# Patient Record
Sex: Female | Born: 1940
Health system: Southern US, Community
[De-identification: ages and names within clinical notes are randomized; demographics above are authoritative.]

## PROBLEM LIST (undated history)

## (undated) DIAGNOSIS — D649 Anemia, unspecified: Secondary | ICD-10-CM

## (undated) DIAGNOSIS — C50919 Malignant neoplasm of unspecified site of unspecified female breast: Secondary | ICD-10-CM

## (undated) DIAGNOSIS — M810 Age-related osteoporosis without current pathological fracture: Secondary | ICD-10-CM

## (undated) DIAGNOSIS — Z923 Personal history of irradiation: Secondary | ICD-10-CM

## (undated) DIAGNOSIS — C50312 Malignant neoplasm of lower-inner quadrant of left female breast: Secondary | ICD-10-CM

## (undated) DIAGNOSIS — E785 Hyperlipidemia, unspecified: Secondary | ICD-10-CM

## (undated) HISTORY — DX: Anemia, unspecified: D64.9

## (undated) HISTORY — DX: Age-related osteoporosis without current pathological fracture: M81.0

## (undated) HISTORY — DX: Hyperlipidemia, unspecified: E78.5

## (undated) HISTORY — DX: Malignant neoplasm of lower-inner quadrant of left female breast: C50.312

---

## 2008-09-18 HISTORY — PX: COLONOSCOPY: SHX174

## 2009-05-11 ENCOUNTER — Ambulatory Visit: Payer: Self-pay | Admitting: Gastroenterology

## 2012-08-09 DIAGNOSIS — E785 Hyperlipidemia, unspecified: Secondary | ICD-10-CM | POA: Insufficient documentation

## 2012-08-09 DIAGNOSIS — I10 Essential (primary) hypertension: Secondary | ICD-10-CM | POA: Insufficient documentation

## 2012-08-09 DIAGNOSIS — M810 Age-related osteoporosis without current pathological fracture: Secondary | ICD-10-CM | POA: Insufficient documentation

## 2012-08-09 DIAGNOSIS — M419 Scoliosis, unspecified: Secondary | ICD-10-CM | POA: Insufficient documentation

## 2012-08-09 DIAGNOSIS — D649 Anemia, unspecified: Secondary | ICD-10-CM | POA: Insufficient documentation

## 2012-08-12 DIAGNOSIS — D72819 Decreased white blood cell count, unspecified: Secondary | ICD-10-CM | POA: Insufficient documentation

## 2012-09-18 HISTORY — PX: UPPER GI ENDOSCOPY: SHX6162

## 2013-04-25 ENCOUNTER — Ambulatory Visit: Payer: Self-pay | Admitting: Unknown Physician Specialty

## 2013-04-30 LAB — PATHOLOGY REPORT

## 2013-07-09 ENCOUNTER — Ambulatory Visit: Payer: Self-pay | Admitting: Unknown Physician Specialty

## 2014-07-19 DIAGNOSIS — C50312 Malignant neoplasm of lower-inner quadrant of left female breast: Secondary | ICD-10-CM

## 2014-07-19 HISTORY — DX: Malignant neoplasm of lower-inner quadrant of left female breast: C50.312

## 2014-08-18 HISTORY — PX: BREAST BIOPSY: SHX20

## 2014-08-24 ENCOUNTER — Encounter: Payer: Self-pay | Admitting: *Deleted

## 2014-09-08 ENCOUNTER — Encounter: Payer: Self-pay | Admitting: General Surgery

## 2014-09-08 ENCOUNTER — Ambulatory Visit (INDEPENDENT_AMBULATORY_CARE_PROVIDER_SITE_OTHER): Payer: Medicare Other | Admitting: General Surgery

## 2014-09-08 ENCOUNTER — Ambulatory Visit: Payer: Medicare Other

## 2014-09-08 VITALS — BP 130/78 | HR 80 | Resp 12 | Ht 62.0 in | Wt 131.0 lb

## 2014-09-08 DIAGNOSIS — N63 Unspecified lump in unspecified breast: Secondary | ICD-10-CM

## 2014-09-08 NOTE — Patient Instructions (Signed)

## 2014-09-08 NOTE — Progress Notes (Signed)
Patient ID: Rebecca Mitchell, female   DOB: [birth year], 73 y.o.   MRN: [id number]  Chief Complaint  Patient presents with  . Other    left breast mass    HPI Rebecca Mitchell is a 73 y.o. female who presents for a breast evaluation. The most recent mammogram was done on 08/06/14. She had additional views of the left breast 08/21/14 as well as an ultrasound.  Patient does perform regular self breast checks and gets regular mammograms done. She denies any problems with the breasts at this time. No personal history of breast problems. She has a sister with a history of breast cancer.     HPI  Past Medical History  Diagnosis Date  . Hyperlipidemia   . Anemia     Past Surgical History  Procedure Laterality Date  . Colonoscopy  2010  . Upper gi endoscopy  2014    Family History  Problem Relation Age of Onset  . Cancer Sister 38    breast    Social History History  Substance Use Topics  . Smoking status: Never Smoker   . Smokeless tobacco: Not on file  . Alcohol Use: No    No Known Allergies  Current Outpatient Prescriptions  Medication Sig Dispense Refill  . amLODipine (NORVASC) 5 MG tablet Take 5 mg by mouth daily.   0  . Ascorbic Acid (VITAMIN C) 1000 MG tablet Take 1,000 mg by mouth daily.    . Calcium Citrate-Vitamin D (CALCIUM CITRATE + PO) Take 1 tablet by mouth daily.    Marland Kitchen co-enzyme Q-10 30 MG capsule Take 30 mg by mouth 3 (three) times daily.    . ferrous sulfate 325 (65 FE) MG tablet Take 325 mg by mouth daily with breakfast.    . losartan-hydrochlorothiazide (HYZAAR) 100-25 MG per tablet Take 1 tablet by mouth daily.   1  . Lutein-Zeaxanthin 15-0.7 MG CAPS Take 1 capsule by mouth daily.    . metoprolol tartrate (LOPRESSOR) 25 MG tablet Take 12.5 mg by mouth 2 (two) times daily.   1  . Multiple Vitamin (MULTIVITAMIN) tablet Take 1 tablet by mouth daily.    . Omega-3 Fatty Acids (FISH OIL PO) Take 1 capsule by mouth daily.    . pravastatin (PRAVACHOL) 10 MG tablet Take  10 mg by mouth daily.   1   No current facility-administered medications for this visit.    Review of Systems Review of Systems  Constitutional: Negative.   Respiratory: Negative.   Cardiovascular: Negative.     Blood pressure 130/78, pulse 80, resp. rate 12, height 5\' 2"  (1.575 m), weight 131 lb (59.421 kg).  Physical Exam Physical Exam  Constitutional: She is oriented to person, place, and time. She appears well-developed and well-nourished.  Eyes: Conjunctivae are normal. No scleral icterus.  Neck: Neck supple. No thyromegaly present.  Cardiovascular: Normal rate, regular rhythm and normal heart sounds.   No murmur heard. Pulmonary/Chest: Effort normal and breath sounds normal. Right breast exhibits no inverted nipple, no mass, no nipple discharge, no skin change and no tenderness. Left breast exhibits mass (sub cm firm mass at 8 oclock left breast. 7 cm from nipple). Left breast exhibits no inverted nipple, no nipple discharge, no skin change and no tenderness.  Abdominal: Soft. Normal appearance and bowel sounds are normal. There is no hepatosplenomegaly. There is no tenderness. No hernia.  Lymphadenopathy:    She has no cervical adenopathy.    She has no axillary adenopathy.  Neurological: She  is alert and oriented to person, place, and time.  Skin: Skin is warm and dry.    Data Reviewed Mammogram showed an irregular density in left breast LIQ. Corresponding to this US showed a hypoechoic 30mm mass.at 8ocl    Assessment      Left breast mass for which core biopsy is recommended. Discussed with pt and she was agreeable.    Plan    Core biopsy completed and clip placed. Await path report.      CC; Dicky Doe, M.D.  Michaeljames Milnes G 09/08/2014, 7:19 PM

## 2014-09-09 ENCOUNTER — Encounter: Payer: Self-pay | Admitting: General Surgery

## 2014-09-09 ENCOUNTER — Ambulatory Visit (INDEPENDENT_AMBULATORY_CARE_PROVIDER_SITE_OTHER): Payer: Medicare Other | Admitting: General Surgery

## 2014-09-09 ENCOUNTER — Telehealth: Payer: Self-pay | Admitting: *Deleted

## 2014-09-09 VITALS — BP 140/80 | HR 80 | Resp 12 | Ht 64.0 in | Wt 132.0 lb

## 2014-09-09 DIAGNOSIS — N63 Unspecified lump in unspecified breast: Secondary | ICD-10-CM

## 2014-09-09 DIAGNOSIS — C50912 Malignant neoplasm of unspecified site of left female breast: Secondary | ICD-10-CM

## 2014-09-09 NOTE — Telephone Encounter (Signed)
She had called answering service because she noticed bloody drainage on her dressing at the biopsy site. She states Dr Jamal Collin called her back yesterday. Dressing care discussed.

## 2014-09-09 NOTE — Progress Notes (Signed)
This is a 74 year old female here today for a breast discussion. Left breast a little bruised  She had core biopsy of a small mass in left breast yesterday. Path showed invasive ductal CA. ER/PR ,Her 2 pending. Discussed in full with pt and her husband.  Current clinical stage is T1b,N0. Lumpectomy and SN biopsy discussed and pt is agreeable. Role of radiation, possible need for chemo based on prognostic factors and lymph node status explained.  Patient's surgery has been scheduled for 10-01-14 at [facility].

## 2014-09-09 NOTE — Patient Instructions (Signed)
Lumpectomy A lumpectomy is a form of "breast conserving" or "breast preservation" surgery. It may also be referred to as a partial mastectomy. During a lumpectomy, the portion of the breast that contains the cancerous tumor or breast mass (the lump) is removed. Some normal tissue around the lump may also be removed to make sure all of the tumor has been removed.  LET YOUR HEALTH CARE PROVIDER KNOW ABOUT:  Any allergies you have.  All medicines you are taking, including vitamins, herbs, eye drops, creams, and over-the-counter medicines.  Previous problems you or members of your family have had with the use of anesthetics.  Any blood disorders you have.  Previous surgeries you have had.  Medical conditions you have. RISKS AND COMPLICATIONS Generally, this is a safe procedure. However, problems can occur and include:  Bleeding.  Infection.  Pain.  Temporary swelling.  Change in the shape of the breast, particularly if a large portion is removed. BEFORE THE PROCEDURE  Ask your health care provider about changing or stopping your regular medicines. This is especially important if you are taking diabetes medicines or blood thinners.  Do not eat or drink anything after midnight on the night before the procedure or as directed by your health care provider. Ask your health care provider if you can take a sip of water with any approved medicines.  On the day of surgery, your health care provider will use a mammogram or ultrasound to locate and mark the tumor in your breast. These markings on your breast will show where the cut (incision) will be made. PROCEDURE   An IV tube will be put into one of your veins.  You may be given medicine to help you relax before the surgery (sedative). You will be given one of the following:  A medicine that numbs the area (local anesthetic).  A medicine that makes you fall asleep (general anesthetic).  Your health care provider will use a kind of  electric scalpel that uses heat to minimize bleeding (electrocautery knife).  A curved incision (like a smile or frown) that follows the natural curve of your breast is made, to allow for minimal scarring and better healing.  The tumor will be removed with some of the surrounding tissue. This will be sent to the lab for analysis. Your health care provider may also remove your lymph nodes at this time if needed.  Sometimes, but not always, a rubber tube called a drain will be surgically inserted into your breast area or armpit to collect excess fluid that may accumulate in the space where the tumor was. This drain is connected to a plastic bulb on the outside of your body. This drain creates suction to help remove the fluid.  The incisions will be closed with stitches (sutures).  A bandage may be placed over the incisions. AFTER THE PROCEDURE  You will be taken to the recovery area.  You will be given medicine for pain.  A small rubber drain may be placed in the breast for 2-3 days to prevent a collection of blood (hematoma) from developing in the breast. You will be given instructions on caring for the drain before you go home.  A pressure bandage (dressing) will be applied for 1-2 days to prevent bleeding. Ask your health care provider how to care for your bandage at home. Document Released: 10/16/2006 Document Revised: 01/19/2014 Document Reviewed: 02/07/2013 Exit[employer] Patient Information 2015 [employer]. This information is not intended to replace advice given to you by   your health care provider. Make sure you discuss any questions you have with your health care provider.  

## 2014-09-09 NOTE — Addendum Note (Signed)
Addended by: Christene Lye on: 09/09/2014 05:10 PM   Modules accepted: Orders

## 2014-09-14 ENCOUNTER — Ambulatory Visit (INDEPENDENT_AMBULATORY_CARE_PROVIDER_SITE_OTHER): Payer: Self-pay | Admitting: *Deleted

## 2014-09-14 DIAGNOSIS — C50912 Malignant neoplasm of unspecified site of left female breast: Secondary | ICD-10-CM

## 2014-09-14 NOTE — Patient Instructions (Signed)
The patient is aware to call back for any questions or concerns.  

## 2014-09-14 NOTE — Progress Notes (Signed)
Patient here today for follow up post breast biopsy.  Dressing removed, steristrip in place and aware it may come off in one week.  Minimal bruising noted.  The patient is aware that a heating pad may be used for comfort as needed.  Aware of pathology. Follow up as scheduled.

## 2014-09-15 ENCOUNTER — Telehealth: Payer: Self-pay | Admitting: *Deleted

## 2014-09-15 NOTE — Telephone Encounter (Signed)
Notified patient as instructed ER/PR positive, patient pleased. Discussed follow-up appointments 09-28-14, patient agrees. Surgery planned for 10-01-14.

## 2014-09-18 DIAGNOSIS — Z923 Personal history of irradiation: Secondary | ICD-10-CM

## 2014-09-18 DIAGNOSIS — C50919 Malignant neoplasm of unspecified site of unspecified female breast: Secondary | ICD-10-CM

## 2014-09-18 HISTORY — DX: Personal history of irradiation: Z92.3

## 2014-09-18 HISTORY — DX: Malignant neoplasm of unspecified site of unspecified female breast: C50.919

## 2014-09-21 ENCOUNTER — Ambulatory Visit: Payer: Self-pay | Admitting: General Surgery

## 2014-09-21 DIAGNOSIS — I10 Essential (primary) hypertension: Secondary | ICD-10-CM | POA: Diagnosis not present

## 2014-09-21 DIAGNOSIS — J208 Acute bronchitis due to other specified organisms: Secondary | ICD-10-CM | POA: Diagnosis not present

## 2014-09-21 DIAGNOSIS — Z01810 Encounter for preprocedural cardiovascular examination: Secondary | ICD-10-CM | POA: Diagnosis not present

## 2014-09-21 DIAGNOSIS — C50912 Malignant neoplasm of unspecified site of left female breast: Secondary | ICD-10-CM | POA: Diagnosis not present

## 2014-09-21 DIAGNOSIS — R05 Cough: Secondary | ICD-10-CM | POA: Diagnosis not present

## 2014-09-21 DIAGNOSIS — Z01812 Encounter for preprocedural laboratory examination: Secondary | ICD-10-CM | POA: Diagnosis not present

## 2014-09-21 LAB — POTASSIUM: Potassium: 4.1 mmol/L (ref 3.5–5.1)

## 2014-09-28 ENCOUNTER — Ambulatory Visit: Payer: Self-pay

## 2014-09-28 ENCOUNTER — Encounter: Payer: Self-pay | Admitting: General Surgery

## 2014-09-28 ENCOUNTER — Ambulatory Visit (INDEPENDENT_AMBULATORY_CARE_PROVIDER_SITE_OTHER): Payer: Medicare Other | Admitting: General Surgery

## 2014-09-28 VITALS — BP 132/68 | HR 70 | Resp 14 | Ht 64.0 in | Wt 136.0 lb

## 2014-09-28 DIAGNOSIS — N63 Unspecified lump in breast: Secondary | ICD-10-CM

## 2014-09-28 DIAGNOSIS — N6320 Unspecified lump in the left breast, unspecified quadrant: Secondary | ICD-10-CM

## 2014-09-28 NOTE — Patient Instructions (Signed)
Lumpectomy A lumpectomy is a form of "breast conserving" or "breast preservation" surgery. It may also be referred to as a partial mastectomy. During a lumpectomy, the portion of the breast that contains the cancerous tumor or breast mass (the lump) is removed. Some normal tissue around the lump may also be removed to make sure all of the tumor has been removed.  LET YOUR HEALTH CARE PROVIDER KNOW ABOUT:  Any allergies you have.  All medicines you are taking, including vitamins, herbs, eye drops, creams, and over-the-counter medicines.  Previous problems you or members of your family have had with the use of anesthetics.  Any blood disorders you have.  Previous surgeries you have had.  Medical conditions you have. RISKS AND COMPLICATIONS Generally, this is a safe procedure. However, problems can occur and include:  Bleeding.  Infection.  Pain.  Temporary swelling.  Change in the shape of the breast, particularly if a large portion is removed. BEFORE THE PROCEDURE  Ask your health care provider about changing or stopping your regular medicines. This is especially important if you are taking diabetes medicines or blood thinners.  Do not eat or drink anything after midnight on the night before the procedure or as directed by your health care provider. Ask your health care provider if you can take a sip of water with any approved medicines.  On the day of surgery, your health care provider will use a mammogram or ultrasound to locate and mark the tumor in your breast. These markings on your breast will show where the cut (incision) will be made. PROCEDURE   An IV tube will be put into one of your veins.  You may be given medicine to help you relax before the surgery (sedative). You will be given one of the following:  A medicine that numbs the area (local anesthetic).  A medicine that makes you fall asleep (general anesthetic).  Your health care provider will use a kind of  electric scalpel that uses heat to minimize bleeding (electrocautery knife).  A curved incision (like a smile or frown) that follows the natural curve of your breast is made, to allow for minimal scarring and better healing.  The tumor will be removed with some of the surrounding tissue. This will be sent to the lab for analysis. Your health care provider may also remove your lymph nodes at this time if needed.  Sometimes, but not always, a rubber tube called a drain will be surgically inserted into your breast area or armpit to collect excess fluid that may accumulate in the space where the tumor was. This drain is connected to a plastic bulb on the outside of your body. This drain creates suction to help remove the fluid.  The incisions will be closed with stitches (sutures).  A bandage may be placed over the incisions. AFTER THE PROCEDURE  You will be taken to the recovery area.  You will be given medicine for pain.  A small rubber drain may be placed in the breast for 2-3 days to prevent a collection of blood (hematoma) from developing in the breast. You will be given instructions on caring for the drain before you go home.  A pressure bandage (dressing) will be applied for 1-2 days to prevent bleeding. Ask your health care provider how to care for your bandage at home. Document Released: 10/16/2006 Document Revised: 01/19/2014 Document Reviewed: 02/07/2013 Exit[employer] Patient Information 2015 [employer]. This information is not intended to replace advice given to you by   your health care provider. Make sure you discuss any questions you have with your health care provider.  

## 2014-09-28 NOTE — Progress Notes (Signed)
Patient ID: Rebecca Mitchell, female   DOB: 11-Jun-1941, 74 y.o.   MRN: [id number]  Chief Complaint  Patient presents with  . Pre-op Exam    left breast lumpectomy    HPI Rebecca Mitchell is a 74 y.o. female here today for her pre op left breast lumpectomy scheduled for 10/01/14. Pt had a 5 mm left breast nodule at 8ocl, biopsy showed invasive CA , ER pos and her-2 neg HPI  Past Medical History  Diagnosis Date  . Hyperlipidemia   . Anemia     Past Surgical History  Procedure Laterality Date  . Colonoscopy  2010  . Upper gi endoscopy  2014    Family History  Problem Relation Age of Onset  . Cancer Sister 16    breast    Social History History  Substance Use Topics  . Smoking status: Never Smoker   . Smokeless tobacco: Not on file  . Alcohol Use: No    No Known Allergies  Current Outpatient Prescriptions  Medication Sig Dispense Refill  . azithromycin (ZITHROMAX) 250 MG tablet   0  . amLODipine (NORVASC) 5 MG tablet Take 5 mg by mouth daily.   0  . Ascorbic Acid (VITAMIN C) 1000 MG tablet Take 1,000 mg by mouth daily.    . Calcium Citrate-Vitamin D (CALCIUM CITRATE + PO) Take 1 tablet by mouth daily.    Marland Kitchen co-enzyme Q-10 30 MG capsule Take 30 mg by mouth 3 (three) times daily.    . ferrous sulfate 325 (65 FE) MG tablet Take 325 mg by mouth daily with breakfast.    . losartan-hydrochlorothiazide (HYZAAR) 100-25 MG per tablet Take 1 tablet by mouth daily.   1  . Lutein-Zeaxanthin 15-0.7 MG CAPS Take 1 capsule by mouth daily.    . metoprolol tartrate (LOPRESSOR) 25 MG tablet Take 12.5 mg by mouth 2 (two) times daily.   1  . Multiple Vitamin (MULTIVITAMIN) tablet Take 1 tablet by mouth daily.    . Omega-3 Fatty Acids (FISH OIL PO) Take 1 capsule by mouth daily.    . pravastatin (PRAVACHOL) 10 MG tablet Take 10 mg by mouth daily.   1   No current facility-administered medications for this visit.    Review of Systems Review of Systems  Constitutional: Negative.    Respiratory: Negative.   Cardiovascular: Negative.     Blood pressure 132/68, pulse 70, resp. rate 14, height _0  (1.626 m), weight 136 lb (61.689 kg).  Physical Exam Physical Exam  Constitutional: She is oriented to person, place, and time. She appears well-developed and well-nourished.  Eyes: Conjunctivae are normal. No scleral icterus.  Neck: Neck supple.  Cardiovascular: Normal rate, regular rhythm and normal heart sounds.   Pulmonary/Chest: Effort normal and breath sounds normal. Right breast exhibits no inverted nipple, no mass, no nipple discharge, no skin change and no tenderness. Left breast exhibits no inverted nipple, no nipple discharge, no skin change and no tenderness.  Abdominal: Soft. Normal appearance and bowel sounds are normal. There is no hepatosplenomegaly. There is no tenderness. No hernia.  Lymphadenopathy:    She has no cervical adenopathy.    She has no axillary adenopathy.  Neurological: She is alert and oriented to person, place, and time.  Skin: Skin is warm and dry.    Data Reviewed US done today at 8 ocl left breast,shadwing area noted consistent with clip and prior mass  Assessment     CA left breast- clinical stage 1-T1,N0. ER pos,  Her 2 not amplified  Plan   Discussed lumpectomy and SN biopsy with pt. Patient is scheduled for left breast lumpectomy and SN biopsy on 10/01/14.         Taelon Bendorf G 09/28/2014, 3:45 PM

## 2014-10-01 ENCOUNTER — Ambulatory Visit: Payer: Self-pay | Admitting: General Surgery

## 2014-10-01 ENCOUNTER — Encounter: Payer: Self-pay | Admitting: General Surgery

## 2014-10-01 DIAGNOSIS — C50312 Malignant neoplasm of lower-inner quadrant of left female breast: Secondary | ICD-10-CM

## 2014-10-01 DIAGNOSIS — C50919 Malignant neoplasm of unspecified site of unspecified female breast: Secondary | ICD-10-CM | POA: Diagnosis not present

## 2014-10-01 DIAGNOSIS — C50912 Malignant neoplasm of unspecified site of left female breast: Secondary | ICD-10-CM | POA: Diagnosis not present

## 2014-10-01 DIAGNOSIS — I10 Essential (primary) hypertension: Secondary | ICD-10-CM | POA: Diagnosis not present

## 2014-10-01 HISTORY — PX: BREAST LUMPECTOMY: SHX2

## 2014-10-01 HISTORY — PX: BREAST SURGERY: SHX581

## 2014-10-05 ENCOUNTER — Encounter: Payer: Self-pay | Admitting: General Surgery

## 2014-10-12 ENCOUNTER — Ambulatory Visit (INDEPENDENT_AMBULATORY_CARE_PROVIDER_SITE_OTHER): Payer: Self-pay | Admitting: General Surgery

## 2014-10-12 ENCOUNTER — Encounter: Payer: Self-pay | Admitting: General Surgery

## 2014-10-12 DIAGNOSIS — C50912 Malignant neoplasm of unspecified site of left female breast: Secondary | ICD-10-CM

## 2014-10-12 NOTE — Progress Notes (Signed)
This is a 74 year old female here today for her post op left breast lumpectomy and sentinel node biopsy done on 10/01/14. Patient states she is doing well.  Left excision site healing well. No signs of infection/seroma. Pathology showed a clear margins, negative node.   Patient will be referred to Radiation Oncologist for treatment. Patient to return after radiation therapy is complete.   Patient has been scheduled for an appointment with Dr. Baruch Gouty at the [facility] for Friday, 10-16-14 at 11 am.

## 2014-10-12 NOTE — Patient Instructions (Addendum)
Patient to follow up with our office once radiation therapy is complete. The patient is aware to call back for any questions or concerns.  Patient has been scheduled for an appointment with Dr. Baruch Gouty at the [facility] for Friday, 10-16-14 at 11 am.

## 2014-10-13 ENCOUNTER — Encounter: Payer: Self-pay | Admitting: General Surgery

## 2014-10-13 DIAGNOSIS — Z853 Personal history of malignant neoplasm of breast: Secondary | ICD-10-CM | POA: Insufficient documentation

## 2014-10-16 ENCOUNTER — Ambulatory Visit: Payer: Self-pay | Admitting: Radiation Oncology

## 2014-10-16 DIAGNOSIS — C50312 Malignant neoplasm of lower-inner quadrant of left female breast: Secondary | ICD-10-CM | POA: Diagnosis not present

## 2014-10-16 DIAGNOSIS — C50912 Malignant neoplasm of unspecified site of left female breast: Secondary | ICD-10-CM | POA: Diagnosis not present

## 2014-10-19 ENCOUNTER — Ambulatory Visit: Payer: Self-pay | Admitting: Radiation Oncology

## 2014-10-28 DIAGNOSIS — C50312 Malignant neoplasm of lower-inner quadrant of left female breast: Secondary | ICD-10-CM | POA: Diagnosis not present

## 2014-10-28 DIAGNOSIS — Z510 Encounter for antineoplastic radiation therapy: Secondary | ICD-10-CM | POA: Diagnosis not present

## 2014-10-29 DIAGNOSIS — C50312 Malignant neoplasm of lower-inner quadrant of left female breast: Secondary | ICD-10-CM | POA: Diagnosis not present

## 2014-11-02 ENCOUNTER — Ambulatory Visit: Payer: Self-pay | Admitting: Radiation Oncology

## 2014-11-03 DIAGNOSIS — Z510 Encounter for antineoplastic radiation therapy: Secondary | ICD-10-CM | POA: Diagnosis not present

## 2014-11-03 DIAGNOSIS — C50312 Malignant neoplasm of lower-inner quadrant of left female breast: Secondary | ICD-10-CM | POA: Diagnosis not present

## 2014-11-04 DIAGNOSIS — C50312 Malignant neoplasm of lower-inner quadrant of left female breast: Secondary | ICD-10-CM | POA: Diagnosis not present

## 2014-11-05 DIAGNOSIS — Z510 Encounter for antineoplastic radiation therapy: Secondary | ICD-10-CM | POA: Diagnosis not present

## 2014-11-05 DIAGNOSIS — C50312 Malignant neoplasm of lower-inner quadrant of left female breast: Secondary | ICD-10-CM | POA: Diagnosis not present

## 2014-11-09 DIAGNOSIS — C50312 Malignant neoplasm of lower-inner quadrant of left female breast: Secondary | ICD-10-CM | POA: Diagnosis not present

## 2014-11-09 DIAGNOSIS — Z510 Encounter for antineoplastic radiation therapy: Secondary | ICD-10-CM | POA: Diagnosis not present

## 2014-11-10 DIAGNOSIS — Z510 Encounter for antineoplastic radiation therapy: Secondary | ICD-10-CM | POA: Diagnosis not present

## 2014-11-10 DIAGNOSIS — C50312 Malignant neoplasm of lower-inner quadrant of left female breast: Secondary | ICD-10-CM | POA: Diagnosis not present

## 2014-11-11 DIAGNOSIS — Z510 Encounter for antineoplastic radiation therapy: Secondary | ICD-10-CM | POA: Diagnosis not present

## 2014-11-11 DIAGNOSIS — C50312 Malignant neoplasm of lower-inner quadrant of left female breast: Secondary | ICD-10-CM | POA: Diagnosis not present

## 2014-11-11 DIAGNOSIS — I10 Essential (primary) hypertension: Secondary | ICD-10-CM | POA: Diagnosis not present

## 2014-11-11 DIAGNOSIS — C50319 Malignant neoplasm of lower-inner quadrant of unspecified female breast: Secondary | ICD-10-CM | POA: Diagnosis not present

## 2014-11-11 DIAGNOSIS — E780 Pure hypercholesterolemia: Secondary | ICD-10-CM | POA: Diagnosis not present

## 2014-11-11 DIAGNOSIS — M4120 Other idiopathic scoliosis, site unspecified: Secondary | ICD-10-CM | POA: Diagnosis not present

## 2014-11-11 DIAGNOSIS — M810 Age-related osteoporosis without current pathological fracture: Secondary | ICD-10-CM | POA: Diagnosis not present

## 2014-11-12 DIAGNOSIS — Z510 Encounter for antineoplastic radiation therapy: Secondary | ICD-10-CM | POA: Diagnosis not present

## 2014-11-12 DIAGNOSIS — C50312 Malignant neoplasm of lower-inner quadrant of left female breast: Secondary | ICD-10-CM | POA: Diagnosis not present

## 2014-11-13 DIAGNOSIS — Z510 Encounter for antineoplastic radiation therapy: Secondary | ICD-10-CM | POA: Diagnosis not present

## 2014-11-13 DIAGNOSIS — C50312 Malignant neoplasm of lower-inner quadrant of left female breast: Secondary | ICD-10-CM | POA: Diagnosis not present

## 2014-11-16 DIAGNOSIS — C50312 Malignant neoplasm of lower-inner quadrant of left female breast: Secondary | ICD-10-CM | POA: Diagnosis not present

## 2014-11-16 DIAGNOSIS — Z510 Encounter for antineoplastic radiation therapy: Secondary | ICD-10-CM | POA: Diagnosis not present

## 2014-11-17 ENCOUNTER — Ambulatory Visit
Admit: 2014-11-17 | Disposition: A | Discharge status: Home / Self Care | Payer: Self-pay | Attending: Radiation Oncology | Admitting: Radiation Oncology

## 2014-11-17 DIAGNOSIS — Z510 Encounter for antineoplastic radiation therapy: Secondary | ICD-10-CM | POA: Diagnosis not present

## 2014-11-17 DIAGNOSIS — C50912 Malignant neoplasm of unspecified site of left female breast: Secondary | ICD-10-CM | POA: Diagnosis not present

## 2014-11-18 DIAGNOSIS — C50312 Malignant neoplasm of lower-inner quadrant of left female breast: Secondary | ICD-10-CM | POA: Diagnosis not present

## 2014-11-18 DIAGNOSIS — Z510 Encounter for antineoplastic radiation therapy: Secondary | ICD-10-CM | POA: Diagnosis not present

## 2014-11-18 DIAGNOSIS — C50912 Malignant neoplasm of unspecified site of left female breast: Secondary | ICD-10-CM | POA: Diagnosis not present

## 2014-11-19 DIAGNOSIS — C50912 Malignant neoplasm of unspecified site of left female breast: Secondary | ICD-10-CM | POA: Diagnosis not present

## 2014-11-19 DIAGNOSIS — C50312 Malignant neoplasm of lower-inner quadrant of left female breast: Secondary | ICD-10-CM | POA: Diagnosis not present

## 2014-11-19 DIAGNOSIS — Z510 Encounter for antineoplastic radiation therapy: Secondary | ICD-10-CM | POA: Diagnosis not present

## 2014-11-20 DIAGNOSIS — C50912 Malignant neoplasm of unspecified site of left female breast: Secondary | ICD-10-CM | POA: Diagnosis not present

## 2014-11-20 DIAGNOSIS — Z510 Encounter for antineoplastic radiation therapy: Secondary | ICD-10-CM | POA: Diagnosis not present

## 2014-11-23 DIAGNOSIS — C50912 Malignant neoplasm of unspecified site of left female breast: Secondary | ICD-10-CM | POA: Diagnosis not present

## 2014-11-23 DIAGNOSIS — Z510 Encounter for antineoplastic radiation therapy: Secondary | ICD-10-CM | POA: Diagnosis not present

## 2014-11-24 DIAGNOSIS — C50912 Malignant neoplasm of unspecified site of left female breast: Secondary | ICD-10-CM | POA: Diagnosis not present

## 2014-11-24 DIAGNOSIS — Z510 Encounter for antineoplastic radiation therapy: Secondary | ICD-10-CM | POA: Diagnosis not present

## 2014-11-25 DIAGNOSIS — C50312 Malignant neoplasm of lower-inner quadrant of left female breast: Secondary | ICD-10-CM | POA: Diagnosis not present

## 2014-11-25 DIAGNOSIS — Z510 Encounter for antineoplastic radiation therapy: Secondary | ICD-10-CM | POA: Diagnosis not present

## 2014-11-25 DIAGNOSIS — C50912 Malignant neoplasm of unspecified site of left female breast: Secondary | ICD-10-CM | POA: Diagnosis not present

## 2014-11-27 DIAGNOSIS — Z510 Encounter for antineoplastic radiation therapy: Secondary | ICD-10-CM | POA: Diagnosis not present

## 2014-11-27 DIAGNOSIS — C50912 Malignant neoplasm of unspecified site of left female breast: Secondary | ICD-10-CM | POA: Diagnosis not present

## 2014-11-30 DIAGNOSIS — C50912 Malignant neoplasm of unspecified site of left female breast: Secondary | ICD-10-CM | POA: Diagnosis not present

## 2014-11-30 DIAGNOSIS — Z510 Encounter for antineoplastic radiation therapy: Secondary | ICD-10-CM | POA: Diagnosis not present

## 2014-12-01 DIAGNOSIS — C50912 Malignant neoplasm of unspecified site of left female breast: Secondary | ICD-10-CM | POA: Diagnosis not present

## 2014-12-01 DIAGNOSIS — Z510 Encounter for antineoplastic radiation therapy: Secondary | ICD-10-CM | POA: Diagnosis not present

## 2014-12-02 DIAGNOSIS — Z510 Encounter for antineoplastic radiation therapy: Secondary | ICD-10-CM | POA: Diagnosis not present

## 2014-12-02 DIAGNOSIS — C50312 Malignant neoplasm of lower-inner quadrant of left female breast: Secondary | ICD-10-CM | POA: Diagnosis not present

## 2014-12-02 DIAGNOSIS — C50912 Malignant neoplasm of unspecified site of left female breast: Secondary | ICD-10-CM | POA: Diagnosis not present

## 2014-12-03 DIAGNOSIS — C50912 Malignant neoplasm of unspecified site of left female breast: Secondary | ICD-10-CM | POA: Diagnosis not present

## 2014-12-03 DIAGNOSIS — Z510 Encounter for antineoplastic radiation therapy: Secondary | ICD-10-CM | POA: Diagnosis not present

## 2014-12-04 DIAGNOSIS — Z510 Encounter for antineoplastic radiation therapy: Secondary | ICD-10-CM | POA: Diagnosis not present

## 2014-12-04 DIAGNOSIS — C50912 Malignant neoplasm of unspecified site of left female breast: Secondary | ICD-10-CM | POA: Diagnosis not present

## 2014-12-07 DIAGNOSIS — Z510 Encounter for antineoplastic radiation therapy: Secondary | ICD-10-CM | POA: Diagnosis not present

## 2014-12-07 DIAGNOSIS — C50912 Malignant neoplasm of unspecified site of left female breast: Secondary | ICD-10-CM | POA: Diagnosis not present

## 2014-12-08 DIAGNOSIS — Z510 Encounter for antineoplastic radiation therapy: Secondary | ICD-10-CM | POA: Diagnosis not present

## 2014-12-08 DIAGNOSIS — C50912 Malignant neoplasm of unspecified site of left female breast: Secondary | ICD-10-CM | POA: Diagnosis not present

## 2014-12-09 DIAGNOSIS — C50912 Malignant neoplasm of unspecified site of left female breast: Secondary | ICD-10-CM | POA: Diagnosis not present

## 2014-12-09 DIAGNOSIS — Z510 Encounter for antineoplastic radiation therapy: Secondary | ICD-10-CM | POA: Diagnosis not present

## 2014-12-10 DIAGNOSIS — C50912 Malignant neoplasm of unspecified site of left female breast: Secondary | ICD-10-CM | POA: Diagnosis not present

## 2014-12-10 DIAGNOSIS — Z510 Encounter for antineoplastic radiation therapy: Secondary | ICD-10-CM | POA: Diagnosis not present

## 2014-12-11 DIAGNOSIS — Z510 Encounter for antineoplastic radiation therapy: Secondary | ICD-10-CM | POA: Diagnosis not present

## 2014-12-11 DIAGNOSIS — C50912 Malignant neoplasm of unspecified site of left female breast: Secondary | ICD-10-CM | POA: Diagnosis not present

## 2014-12-15 ENCOUNTER — Ambulatory Visit (INDEPENDENT_AMBULATORY_CARE_PROVIDER_SITE_OTHER): Payer: Medicare Other | Admitting: General Surgery

## 2014-12-15 ENCOUNTER — Encounter: Payer: Self-pay | Admitting: General Surgery

## 2014-12-15 VITALS — BP 118/60 | HR 74 | Resp 12 | Ht 64.0 in | Wt 131.0 lb

## 2014-12-15 DIAGNOSIS — C50912 Malignant neoplasm of unspecified site of left female breast: Secondary | ICD-10-CM

## 2014-12-15 NOTE — Progress Notes (Signed)
Patient ID: Rebecca Mitchell, female   DOB: [birth year], 74 y.o.   MRN: [id number]  Chief Complaint  Patient presents with  . Follow-up    breast cancer     HPI Rebecca Mitchell is a 74 y.o. female  Here today for her follow up  Left breast cancer.  Patient finished her radiation on 12/11/14.  HPI  Past Medical History  Diagnosis Date  . Hyperlipidemia   . Anemia     Past Surgical History  Procedure Laterality Date  . Colonoscopy  2010  . Upper gi endoscopy  2014  . Breast surgery Left 10/01/2014    lumpectomy    Family History  Problem Relation Age of Onset  . Cancer Sister 74    breast    Social History History  Substance Use Topics  . Smoking status: Never Smoker   . Smokeless tobacco: Not on file  . Alcohol Use: No    No Known Allergies  Current Outpatient Prescriptions  Medication Sig Dispense Refill  . amLODipine (NORVASC) 5 MG tablet Take 5 mg by mouth daily.   0  . Ascorbic Acid (VITAMIN C) 1000 MG tablet Take 1,000 mg by mouth daily.    Marland Kitchen azithromycin (ZITHROMAX) 250 MG tablet   0  . Calcium Citrate-Vitamin D (CALCIUM CITRATE + PO) Take 1 tablet by mouth daily.    Marland Kitchen co-enzyme Q-10 30 MG capsule Take 30 mg by mouth 3 (three) times daily.    . ferrous sulfate 325 (65 FE) MG tablet Take 325 mg by mouth daily with breakfast.    . losartan-hydrochlorothiazide (HYZAAR) 100-25 MG per tablet Take 1 tablet by mouth daily.   1  . Lutein-Zeaxanthin 15-0.7 MG CAPS Take 1 capsule by mouth daily.    . metoprolol tartrate (LOPRESSOR) 25 MG tablet Take 12.5 mg by mouth 2 (two) times daily.   1  . Multiple Vitamin (MULTIVITAMIN) tablet Take 1 tablet by mouth daily.    . Omega-3 Fatty Acids (FISH OIL PO) Take 1 capsule by mouth daily.    . pravastatin (PRAVACHOL) 10 MG tablet Take 10 mg by mouth daily.   1   No current facility-administered medications for this visit.    Review of Systems Review of Systems  Constitutional: Negative.   Respiratory: Negative.    Cardiovascular: Negative.     Blood pressure 118/60, pulse 74, resp. rate 12, height 5\' 4"  (1.626 m), weight 131 lb (59.421 kg).  Physical Exam Physical Exam  Constitutional: She is oriented to person, place, and time. She appears well-developed and well-nourished.  Neck: Neck supple.  Pulmonary/Chest: Left breast exhibits no inverted nipple, no mass, no nipple discharge, no skin change and no tenderness.  Mild skin changes left breast from radiation.   Lymphadenopathy:    She has no cervical adenopathy.  Neurological: She is alert and oriented to person, place, and time.  Skin: Skin is warm and dry.    Data Reviewed None  Assessment    CA left breast- clinical stage 1-T1,N0. ER/PR pos, Her 2 not amplified.     Plan    3 mo follow up with left diagnostic mammogram       Mivaan Corbitt G 12/15/2014, 2:03 PM

## 2014-12-15 NOTE — Patient Instructions (Addendum)
3 mo follow up with left diagnostic mammogram

## 2014-12-18 ENCOUNTER — Ambulatory Visit
Admit: 2014-12-18 | Disposition: A | Discharge status: Home / Self Care | Payer: Self-pay | Attending: Radiation Oncology | Admitting: Radiation Oncology

## 2015-01-11 LAB — SURGICAL PATHOLOGY
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
## 2015-01-17 NOTE — Op Note (Signed)
PATIENT NAME:  Rebecca Mitchell, Rebecca Mitchell MR#:  [id number] DATE OF BIRTH:  09/21/1940  DATE OF PROCEDURE:  10/01/2014   PREOPERATIVE DIAGNOSIS: Invasive carcinoma, left breast.   POSTOPERATIVE DIAGNOSIS:  Invasive carcinoma, left breast.  OPERATION:    1.  Left breast lumpectomy with ultrasound guidance under localization. 2.  Mitchell sentinel node biopsy.   SURGEON:  Robinette Haines, M.D.   ANESTHESIA: General.   COMPLICATIONS: None.   ESTIMATED BLOOD LOSS: Minimal.   DRAINS: None.   DESCRIPTION OF PROCEDURE: The patient was brought to the Operating Room after she underwent nuclear contrast injection in the left subareolar region.  She was put to sleep with an LMA and the left breast and axilla were prepped and draped on Mitchell sterile field. The patient had excellent signal activity overlying Mitchell point in the axilla consistent with the location of the sentinel node. Timeout was performed, incision was made transversely overlying the axilla in the site where the signal activity was noted. This was carefully deepened through into the axillary fat pad and bleeding controlled with cautery. Using the [facility] finder Mitchell single 1 cm long lymph node was identified which had 10 signal activity, both in vivo in vitro. This was sent off as sentinel node and subsequent frozen section showed no apparent evidence of macrometastases. Evaluation of the rest of the axilla showed no further signal activity after the initial node was removed, nor were there any visible or palpable noes in this region. Deeper tissues were closed with 2-0 Vicryl in 2 layers and the skin closed with subcuticular 4-0 Vicryl.  Lumpectomy was then performed at the location of the mass.  The lesion was about 8 o'clock in the left breast with about 7 to 8 cm from the nipple.  Ultrasound probe was brought up to the field.  The nodule was identified which is fairly small and with Mitchell small stab incision Mitchell Bard UltraWire was positioned going through this and anchored in place.   Mitchell skin incision was made along the skin crease overlying this region and carefully deepened through to the glandular tissue. The skin and subcutaneous tissue were elevated on both sides and the wire was used as Mitchell guide to excise out good portion of the breast tissue containing Mitchell firm nodule within it.  The specimen mammogram did show that there was the previously placed clip within this. It appeared that the margins were somewhat close to the medial and superior aspect, although, grossly it appeared to be free.  An additional excision was not performed at this time. Hemostasis was ensured with cautery. The gland elements were approximated with 2-0 Vicryl, as also the subcutaneous tissue, and the skin was closed with subcuticular 4-0 Vicryl.  Both incisions in the breast and axilla were covered with LiquiBand. The patient subsequently returned to the recovery room in stable condition     ____________________________ S.Robinette Haines, MD sgs:DT D: 10/02/2014 15:30:49 ET T: 10/02/2014 16:13:41 ET JOB#: [id number]  cc: S.G. Jamal Collin, MD, <Dictator> [facility] Robinette Haines MD ELECTRONICALLY SIGNED 10/06/2014 7:20

## 2015-01-20 ENCOUNTER — Other Ambulatory Visit: Payer: Self-pay

## 2015-01-20 DIAGNOSIS — C50312 Malignant neoplasm of lower-inner quadrant of left female breast: Secondary | ICD-10-CM

## 2015-01-25 ENCOUNTER — Telehealth: Payer: Self-pay

## 2015-01-25 NOTE — Telephone Encounter (Signed)
Patient called asking about her anti hormone pill. She was not sure if she was to start this or not and if it was going to be called into her pharmacy.

## 2015-01-26 ENCOUNTER — Other Ambulatory Visit: Payer: Self-pay | Admitting: General Surgery

## 2015-01-26 MED ORDER — LETROZOLE 2.5 MG PO TABS: 2.5000 mg | ORAL_TABLET | Freq: Every day | ORAL | Status: DC

## 2015-01-26 NOTE — Telephone Encounter (Signed)
RX was sent electronically but ended up printing, so I called it in. I left message that RX was called in.

## 2015-02-03 DIAGNOSIS — Z008 Encounter for other general examination: Secondary | ICD-10-CM | POA: Diagnosis not present

## 2015-03-05 DIAGNOSIS — Z923 Personal history of irradiation: Secondary | ICD-10-CM | POA: Diagnosis not present

## 2015-03-05 DIAGNOSIS — Z9889 Other specified postprocedural states: Secondary | ICD-10-CM | POA: Diagnosis not present

## 2015-03-05 DIAGNOSIS — C50312 Malignant neoplasm of lower-inner quadrant of left female breast: Secondary | ICD-10-CM | POA: Diagnosis not present

## 2015-03-15 ENCOUNTER — Ambulatory Visit (INDEPENDENT_AMBULATORY_CARE_PROVIDER_SITE_OTHER): Payer: Medicare Other | Admitting: General Surgery

## 2015-03-15 ENCOUNTER — Encounter: Payer: Self-pay | Admitting: General Surgery

## 2015-03-15 VITALS — BP 124/70 | HR 74 | Resp 12 | Ht 64.0 in | Wt 135.0 lb

## 2015-03-15 DIAGNOSIS — C50312 Malignant neoplasm of lower-inner quadrant of left female breast: Secondary | ICD-10-CM

## 2015-03-15 DIAGNOSIS — C50912 Malignant neoplasm of unspecified site of left female breast: Secondary | ICD-10-CM | POA: Diagnosis not present

## 2015-03-15 NOTE — Progress Notes (Signed)
Patient ID: Rebecca Mitchell, female   DOB: 10-05-1940, 74 y.o.   MRN: [id number]  Chief Complaint  Patient presents with  . Follow-up    mammogram    HPI Rebecca Mitchell is a 74 y.o. female who presents for 6 month follow up of left breast cancer. The most recent left breast mammogram was done on 03/09/15.  Patient does perform regular self breast checks and gets regular mammograms done.  Patient finished her radiation in April 2016. On current anti-hormonal therapy.   HPI  Past Medical History  Diagnosis Date  . Hyperlipidemia   . Anemia   . Malignant neoplasm of lower-inner quadrant of left female breast 07/2014    Past Surgical History  Procedure Laterality Date  . Colonoscopy  2010  . Upper gi endoscopy  2014  . Breast surgery Left 10/01/2014    lumpectomy    Family History  Problem Relation Age of Onset  . Cancer Sister 82    breast    Social History History  Substance Use Topics  . Smoking status: Never Smoker   . Smokeless tobacco: Not on file  . Alcohol Use: No    No Known Allergies  Current Outpatient Prescriptions  Medication Sig Dispense Refill  . amLODipine (NORVASC) 5 MG tablet Take 5 mg by mouth daily.   0  . Ascorbic Acid (VITAMIN C) 1000 MG tablet Take 1,000 mg by mouth daily.    . Calcium Citrate-Vitamin D (CALCIUM CITRATE + PO) Take 1 tablet by mouth daily.    Marland Kitchen co-enzyme Q-10 30 MG capsule Take 30 mg by mouth 3 (three) times daily.    . ferrous sulfate 325 (65 FE) MG tablet Take 325 mg by mouth daily with breakfast.    . letrozole (FEMARA) 2.5 MG tablet Take 1 tablet (2.5 mg total) by mouth daily. 30 tablet 12  . losartan-hydrochlorothiazide (HYZAAR) 100-25 MG per tablet Take 1 tablet by mouth daily.   1  . Lutein-Zeaxanthin 15-0.7 MG CAPS Take 1 capsule by mouth daily.    . metoprolol tartrate (LOPRESSOR) 25 MG tablet Take 12.5 mg by mouth 2 (two) times daily.   1  . Multiple Vitamin (MULTIVITAMIN) tablet Take 1 tablet by mouth daily.     . Omega-3 Fatty Acids (FISH OIL PO) Take 1 capsule by mouth daily.    . pravastatin (PRAVACHOL) 10 MG tablet Take 10 mg by mouth daily.   1   No current facility-administered medications for this visit.    Review of Systems Review of Systems  Constitutional: Negative.   Respiratory: Negative.   Cardiovascular: Negative.     Blood pressure 124/70, pulse 74, resp. rate 12, height 5\' 4"  (1.626 m), weight 135 lb (61.236 kg).  Physical Exam Physical Exam  Constitutional: She is oriented to person, place, and time. She appears well-developed and well-nourished.  Eyes: Conjunctivae are normal. No scleral icterus.  Neck: Neck supple.  Cardiovascular: Normal rate, regular rhythm and normal heart sounds.   Pulmonary/Chest: Effort normal and breath sounds normal. Right breast exhibits no inverted nipple, no mass, no nipple discharge, no skin change and no tenderness. Left breast exhibits no inverted nipple, no mass, no nipple discharge, no skin change and no tenderness.  Abdominal: Soft. Bowel sounds are normal. There is no hepatomegaly. There is no tenderness.  Lymphadenopathy:    She has no cervical adenopathy.    She has no axillary adenopathy.  Neurological: She is alert and oriented to person, place, and time.  Skin: Skin is warm and dry.    Data Reviewed Mammogram reviewed, stable  Assessment    Breast cancer left breast 6 months post lumpectomy and radiation. Mammogram and exam stable.      Plan   CA 27.29 and CEA ordered    Patient will be asked to return to the office in 6 months with a bilateral diagnostic mammogram.     Rebecca Mitchell 03/15/2015, 1:15 PM

## 2015-03-15 NOTE — Patient Instructions (Addendum)
Patient will be asked to return to the office in 6 months with a bilateral diagnostic mammogram.

## 2015-03-16 LAB — CANCER ANTIGEN 27.29: CA 27.29: 24.9 U/mL (ref 0.0–38.6)

## 2015-03-16 LAB — CEA: CEA: 8.2 ng/mL — AB (ref 0.0–4.7)

## 2015-03-24 ENCOUNTER — Telehealth: Payer: Self-pay | Admitting: *Deleted

## 2015-03-24 DIAGNOSIS — Z853 Personal history of malignant neoplasm of breast: Secondary | ICD-10-CM

## 2015-03-24 NOTE — Telephone Encounter (Signed)
-----   Message from Christene Lye, MD sent at 03/24/2015  8:44 AM EDT ----- CEA is mildly elevated. will repeat this in 1 mo.  Also have pt  do stool cards for Guaiac.

## 2015-03-24 NOTE — Telephone Encounter (Signed)
Notified patient as instructed, patient pleased. Discussed follow-up appointments, patient agrees. Discussed stool cards and lab work for next month.

## 2015-03-26 ENCOUNTER — Telehealth: Payer: Self-pay | Admitting: Family Medicine

## 2015-03-26 DIAGNOSIS — I10 Essential (primary) hypertension: Secondary | ICD-10-CM

## 2015-03-26 MED ORDER — LOSARTAN POTASSIUM-HCTZ 100-25 MG PO TABS: 1.0000 | ORAL_TABLET | Freq: Every day | ORAL | Status: DC

## 2015-03-26 NOTE — Telephone Encounter (Signed)
Refilled 1 month plus 1 refill [facility]

## 2015-03-26 NOTE — Telephone Encounter (Signed)
Pt needs a refill on losartan hctz 100.25 mg sent to [facility] in [place].

## 2015-03-31 ENCOUNTER — Other Ambulatory Visit: Payer: Self-pay | Admitting: Family Medicine

## 2015-03-31 DIAGNOSIS — I10 Essential (primary) hypertension: Secondary | ICD-10-CM

## 2015-03-31 MED ORDER — LOSARTAN POTASSIUM-HCTZ 100-25 MG PO TABS: 1.0000 | ORAL_TABLET | Freq: Every day | ORAL | Status: DC

## 2015-04-01 ENCOUNTER — Other Ambulatory Visit: Payer: Self-pay | Admitting: Family Medicine

## 2015-04-02 ENCOUNTER — Other Ambulatory Visit: Payer: Self-pay

## 2015-04-02 ENCOUNTER — Telehealth: Payer: Self-pay | Admitting: Family Medicine

## 2015-04-02 MED ORDER — AMLODIPINE BESYLATE 5 MG PO TABS: 5.0000 mg | ORAL_TABLET | Freq: Every day | ORAL | Status: DC

## 2015-04-02 MED ORDER — METOPROLOL TARTRATE 25 MG PO TABS: 25.0000 mg | ORAL_TABLET | Freq: Two times a day (BID) | ORAL | Status: DC

## 2015-04-02 NOTE — Telephone Encounter (Signed)
Patient called requesting refills

## 2015-04-02 NOTE — Telephone Encounter (Signed)
Pt needs refills on metoprolol 25 mg and amlodipine 5 mg sent to [facility] in [place]

## 2015-04-09 ENCOUNTER — Ambulatory Visit (INDEPENDENT_AMBULATORY_CARE_PROVIDER_SITE_OTHER): Payer: Medicare Other | Admitting: *Deleted

## 2015-04-09 DIAGNOSIS — K921 Melena: Secondary | ICD-10-CM | POA: Diagnosis not present

## 2015-04-09 LAB — POC HEMOCCULT BLD/STL (HOME/3-CARD/SCREEN)
Card #3 Fecal Occult Blood, POC: POSITIVE
FECAL OCCULT BLD: NEGATIVE
FECAL OCCULT BLD: NEGATIVE

## 2015-04-19 DIAGNOSIS — Z853 Personal history of malignant neoplasm of breast: Secondary | ICD-10-CM | POA: Diagnosis not present

## 2015-04-20 LAB — CEA: CEA: 7.6 ng/mL — ABNORMAL HIGH (ref 0.0–4.7)

## 2015-04-28 ENCOUNTER — Telehealth: Payer: Self-pay | Admitting: *Deleted

## 2015-04-28 NOTE — Telephone Encounter (Signed)
Notified pt of most recent CEA results, per Dr Jamal Collin, improved. Will follow, appointment recall planned for December, pt agrees.

## 2015-05-14 ENCOUNTER — Encounter: Payer: Self-pay | Admitting: Family Medicine

## 2015-05-14 ENCOUNTER — Ambulatory Visit (INDEPENDENT_AMBULATORY_CARE_PROVIDER_SITE_OTHER): Payer: Medicare Other | Admitting: Family Medicine

## 2015-05-14 VITALS — BP 125/76 | HR 64 | Temp 98.1°F | Resp 16 | Ht 64.0 in | Wt 137.6 lb

## 2015-05-14 DIAGNOSIS — M419 Scoliosis, unspecified: Secondary | ICD-10-CM

## 2015-05-14 DIAGNOSIS — I10 Essential (primary) hypertension: Secondary | ICD-10-CM | POA: Diagnosis not present

## 2015-05-14 DIAGNOSIS — C50912 Malignant neoplasm of unspecified site of left female breast: Secondary | ICD-10-CM

## 2015-05-14 DIAGNOSIS — M810 Age-related osteoporosis without current pathological fracture: Secondary | ICD-10-CM

## 2015-05-14 DIAGNOSIS — E785 Hyperlipidemia, unspecified: Secondary | ICD-10-CM | POA: Diagnosis not present

## 2015-05-14 MED ORDER — PRAVASTATIN SODIUM 10 MG PO TABS: 10.0000 mg | ORAL_TABLET | Freq: Every day | ORAL | Status: DC

## 2015-05-14 MED ORDER — METOPROLOL TARTRATE 25 MG PO TABS: 25.0000 mg | ORAL_TABLET | Freq: Two times a day (BID) | ORAL | Status: DC

## 2015-05-14 MED ORDER — LOSARTAN POTASSIUM-HCTZ 100-25 MG PO TABS: 1.0000 | ORAL_TABLET | Freq: Every day | ORAL | Status: DC

## 2015-05-14 MED ORDER — AMLODIPINE BESYLATE 5 MG PO TABS: 5.0000 mg | ORAL_TABLET | Freq: Every day | ORAL | Status: DC

## 2015-05-14 NOTE — Progress Notes (Signed)
Name: Rebecca Mitchell   MRN: [id number]    DOB: June 11, 1941   Date:05/14/2015       Progress Note  Subjective  Chief Complaint  Chief Complaint  Patient presents with  . Hypertension  . Hyperlipidemia  . Osteoporosis  . Scoliosis  . Breast Cancer    Hypertension Pertinent negatives include no blurred vision, chest pain, headaches, malaise/fatigue, orthopnea, palpitations or shortness of breath.  Hyperlipidemia Pertinent negatives include no chest pain, focal weakness, myalgias or shortness of breath.  Here for f/u of HBP, hyperlipidemia.  S/p lumpectomy and radiation and chemo for L. Breast cancer.  Doing well.  No c/o today.Marland Kitchen   No problem-specific assessment & plan notes found for this encounter.   Past Medical History  Diagnosis Date  . Hyperlipidemia   . Anemia   . Malignant neoplasm of lower-inner quadrant of left female breast 07/2014  . Neoplasm of left breast   . Osteoporosis     Social History  Substance Use Topics  . Smoking status: Never Smoker   . Smokeless tobacco: Not on file  . Alcohol Use: No     Current outpatient prescriptions:  .  amLODipine (NORVASC) 5 MG tablet, Take 1 tablet (5 mg total) by mouth daily., Disp: 90 tablet, Rfl: 3 .  Ascorbic Acid (VITAMIN C) 1000 MG tablet, Take 1,000 mg by mouth daily., Disp: , Rfl:  .  Calcium Citrate-Vitamin D (CALCIUM CITRATE + PO), Take 1 tablet by mouth daily., Disp: , Rfl:  .  co-enzyme Q-10 30 MG capsule, Take 30 mg by mouth 3 (three) times daily., Disp: , Rfl:  .  ferrous sulfate 325 (65 FE) MG tablet, Take 325 mg by mouth daily with breakfast., Disp: , Rfl:  .  letrozole (FEMARA) 2.5 MG tablet, Take 1 tablet (2.5 mg total) by mouth daily., Disp: 30 tablet, Rfl: 12 .  losartan-hydrochlorothiazide (HYZAAR) 100-25 MG per tablet, Take 1 tablet by mouth daily., Disp: 30 tablet, Rfl: 2 .  Lutein-Zeaxanthin 15-0.7 MG CAPS, Take 1 capsule by mouth daily., Disp: , Rfl:  .  metoprolol tartrate (LOPRESSOR) 25 MG  tablet, Take 1 tablet (25 mg total) by mouth 2 (two) times daily., Disp: 180 tablet, Rfl: 3 .  Multiple Vitamin (MULTIVITAMIN) tablet, Take 1 tablet by mouth daily., Disp: , Rfl:  .  Omega-3 Fatty Acids (FISH OIL PO), Take 1 capsule by mouth daily., Disp: , Rfl:  .  pravastatin (PRAVACHOL) 10 MG tablet, Take 10 mg by mouth daily. , Disp: , Rfl: 1  No Known Allergies  Review of Systems  Constitutional: Negative for fever, chills, weight loss and malaise/fatigue.  HENT: Negative for hearing loss.   Eyes: Negative for blurred vision and double vision.  Respiratory: Negative for cough, sputum production, shortness of breath and wheezing.   Cardiovascular: Negative for chest pain, palpitations, orthopnea and leg swelling.  Gastrointestinal: Negative for nausea, vomiting, abdominal pain and blood in stool.  Genitourinary: Negative for dysuria, urgency and frequency.  Musculoskeletal: Negative for myalgias and joint pain.  Skin: Negative for rash.  Neurological: Negative for dizziness, sensory change, focal weakness, weakness and headaches.  Psychiatric/Behavioral: Negative for depression. The patient is not nervous/anxious.       Objective  Filed Vitals:   05/14/15 0840  BP: 125/76  Pulse: 64  Temp: 98.1 F (36.7 C)  TempSrc: Oral  Resp: 16  Height: 5\' 4"  (1.626 m)  Weight: 137 lb 9.6 oz (62.415 kg)     Physical Exam  Constitutional:  She is oriented to person, place, and time and well-developed, well-nourished, and in no distress. No distress.  HENT:  Head: Normocephalic and atraumatic.  Eyes: Conjunctivae and EOM are normal. Pupils are equal, round, and reactive to light. No scleral icterus.  Neck: Normal range of motion. Neck supple. No thyromegaly present.  Cardiovascular: Normal rate, regular rhythm, normal heart sounds and intact distal pulses.  Exam reveals no gallop and no friction rub.   No murmur heard. Pulmonary/Chest: Effort normal and breath sounds normal. No  respiratory distress. She has no wheezes. She has no rales. She exhibits deformity (s-shaped scoliosis of lumbar and thoracic spine-unchanged.).  Abdominal: Soft. Bowel sounds are normal. She exhibits no distension and no mass. There is no tenderness.  Musculoskeletal: She exhibits no edema.  Lymphadenopathy:    She has no cervical adenopathy.  Neurological: She is alert and oriented to person, place, and time. Gait normal.  Skin: Skin is warm and dry. No rash noted.  Vitals reviewed.     Recent Results (from the past 2160 hour(s))  CEA     Status: Abnormal   Collection Time: 03/15/15  2:18 PM  Result Value Ref Range   CEA 8.2 (H) 0.0 - 4.7 ng/mL    Comment:        Roche ECLIA methodology       Nonsmokers  <3.9                                      Smokers     <5.6   Cancer antigen 27.29     Status: None   Collection Time: 03/15/15  2:18 PM  Result Value Ref Range   CA 27.29 24.9 0.0 - 38.6 U/mL    Comment: [employer] Centaur/ACS methodology  POC Hemoccult Bld/Stl (3-Cd Home Screen)     Status: None   Collection Time: 04/09/15 12:35 PM  Result Value Ref Range   Card #1 Date     Fecal Occult Blood, POC Negative Negative   Card #2 Date     Card #2 Fecal Occult Blod, POC Negative    Card #3 Date     Card #3 Fecal Occult Blood, POC Positive   CEA     Status: Abnormal   Collection Time: 04/19/15  9:13 AM  Result Value Ref Range   CEA 7.6 (H) 0.0 - 4.7 ng/mL    Comment:        Roche ECLIA methodology       Nonsmokers  <3.9                                      Smokers     <5.6      Assessment & Plan  1. Essential hypertension  - Comprehensive Metabolic Panel (CMET) - losartan-hydrochlorothiazide (HYZAAR) 100-25 MG per tablet; Take 1 tablet by mouth daily.  Dispense: 30 tablet; Refill: 12 - metoprolol tartrate (LOPRESSOR) 25 MG tablet; Take 1 tablet (25 mg total) by mouth 2 (two) times daily.  Dispense: 60 tablet; Refill: 12 - amLODipine (NORVASC) 5 MG tablet; Take 1 tablet (5 mg  total) by mouth daily.  Dispense: 30 tablet; Refill: 12  2. HLD (hyperlipidemia)  - Lipid Profile - pravastatin (PRAVACHOL) 10 MG tablet; Take 1 tablet (10 mg total) by mouth daily.  Dispense: 30 tablet; Refill: 12  3. Malignant neoplasm of breast (female), left  - CBC with Differential  4. Scoliosis   5. OP (osteoporosis)

## 2015-05-14 NOTE — Patient Instructions (Signed)
Continue current meds 

## 2015-05-19 DIAGNOSIS — I10 Essential (primary) hypertension: Secondary | ICD-10-CM | POA: Diagnosis not present

## 2015-05-19 DIAGNOSIS — C50912 Malignant neoplasm of unspecified site of left female breast: Secondary | ICD-10-CM | POA: Diagnosis not present

## 2015-05-19 DIAGNOSIS — E785 Hyperlipidemia, unspecified: Secondary | ICD-10-CM | POA: Diagnosis not present

## 2015-05-19 LAB — CBC WITH DIFFERENTIAL/PLATELET
Basophils Absolute: 0 10*3/uL (ref 0.0–0.2)
Basos: 1 %
EOS (ABSOLUTE): 0.2 10*3/uL (ref 0.0–0.4)
EOS: 5 %
HEMATOCRIT: 38.4 % (ref 34.0–46.6)
HEMOGLOBIN: 13 g/dL (ref 11.1–15.9)
IMMATURE GRANS (ABS): 0 10*3/uL (ref 0.0–0.1)
Immature Granulocytes: 0 %
LYMPHS ABS: 0.9 10*3/uL (ref 0.7–3.1)
LYMPHS: 27 %
MCH: 31.2 pg (ref 26.6–33.0)
MCHC: 33.9 g/dL (ref 31.5–35.7)
MCV: 92 fL (ref 79–97)
MONOCYTES: 14 %
Monocytes Absolute: 0.5 10*3/uL (ref 0.1–0.9)
NEUTROS ABS: 1.8 10*3/uL (ref 1.4–7.0)
Neutrophils: 53 %
Platelets: 191 10*3/uL (ref 150–379)
RBC: 4.17 x10E6/uL (ref 3.77–5.28)
RDW: 12.9 % (ref 12.3–15.4)
WBC: 3.3 10*3/uL — ABNORMAL LOW (ref 3.4–10.8)

## 2015-05-20 ENCOUNTER — Other Ambulatory Visit: Payer: Self-pay | Admitting: Family Medicine

## 2015-05-20 LAB — COMPREHENSIVE METABOLIC PANEL
A/G RATIO: 1.9 (ref 1.1–2.5)
ALT: 20 IU/L (ref 0–32)
AST: 23 IU/L (ref 0–40)
Albumin: 3.9 g/dL (ref 3.5–4.8)
Alkaline Phosphatase: 50 IU/L (ref 39–117)
BUN/Creatinine Ratio: 23 (ref 11–26)
BUN: 17 mg/dL (ref 8–27)
Bilirubin Total: 0.5 mg/dL (ref 0.0–1.2)
CALCIUM: 9.9 mg/dL (ref 8.7–10.3)
CO2: 26 mmol/L (ref 18–29)
Chloride: 103 mmol/L (ref 97–108)
Creatinine, Ser: 0.73 mg/dL (ref 0.57–1.00)
GFR, EST AFRICAN AMERICAN: 94 mL/min/{1.73_m2} (ref >59)
GFR, EST NON AFRICAN AMERICAN: 81 mL/min/{1.73_m2} (ref >59)
Globulin, Total: 2.1 g/dL (ref 1.5–4.5)
Glucose: 101 mg/dL — ABNORMAL HIGH (ref 65–99)
POTASSIUM: 4.6 mmol/L (ref 3.5–5.2)
SODIUM: 142 mmol/L (ref 134–144)
TOTAL PROTEIN: 6 g/dL (ref 6.0–8.5)

## 2015-05-20 LAB — LIPID PANEL
Chol/HDL Ratio: 2.7 ratio units (ref 0.0–4.4)
Cholesterol, Total: 205 mg/dL — ABNORMAL HIGH (ref 100–199)
HDL: 76 mg/dL (ref >39)
LDL Calculated: 116 mg/dL — ABNORMAL HIGH (ref 0–99)
Triglycerides: 67 mg/dL (ref 0–149)

## 2015-05-20 MED ORDER — PRAVASTATIN SODIUM 20 MG PO TABS: 20.0000 mg | ORAL_TABLET | Freq: Every day | ORAL | Status: DC

## 2015-06-14 ENCOUNTER — Ambulatory Visit (INDEPENDENT_AMBULATORY_CARE_PROVIDER_SITE_OTHER): Payer: Medicare Other | Admitting: *Deleted

## 2015-06-14 DIAGNOSIS — Z23 Encounter for immunization: Secondary | ICD-10-CM | POA: Diagnosis not present

## 2015-06-15 ENCOUNTER — Ambulatory Visit: Payer: Medicare Other

## 2015-07-15 ENCOUNTER — Ambulatory Visit
Admission: RE | Admit: 2015-07-15 | Discharge: 2015-07-15 | Disposition: A | Discharge status: Home / Self Care | Payer: Medicare Other | Source: Ambulatory Visit | Attending: Radiation Oncology | Admitting: Radiation Oncology

## 2015-07-15 VITALS — BP 122/72 | HR 57 | Temp 98.1°F | Resp 18 | Wt 136.4 lb

## 2015-07-15 DIAGNOSIS — C50312 Malignant neoplasm of lower-inner quadrant of left female breast: Secondary | ICD-10-CM

## 2015-07-15 NOTE — Progress Notes (Signed)
   Department of Radiation Oncology  Phone:  [phone] Fax:        [phone]   Name: Rebecca Mitchell MRN: [id number]  DOB: 01/11/1941  Date: 07/15/2015  Follow Up Visit Note  Diagnosis: Stage I left breast cancer.   Summary and Interval since last radiation:   Interval History: Rebecca Mitchell presents today for routine followup.  She is doing well. She is tolerating her AI will with no ill effects. She had a mammogram of the left breast in June which was negative. She is due for bilaterals in January. She is working out and feeling well.   Physical Exam:  Filed Vitals:   07/15/15 1136  BP: 122/72  Pulse: 57  Temp: 98.1 F (36.7 C)  Resp: 18  Weight: 136 lb 5.7 oz (61.85 kg)   No palpable abnormalities of the left. Breast. Some edema of the left breast with no palpable abnormalities in the tumor cavity of the left breast. No palpable axillary, supraclavicular or cervical adenopathy.   IMPRESSION: Rebecca Mitchell is a 74 y.o. female s/p breast conservation, currently NED  PLAN:  She is doing well. We discussed the need for follow up every 4-6 months which she has scheduled.  We discussed the need for yearly mammograms which she can schedule with her OBGYN or with medical oncology. We discussed the need for sun protection in the treated area.  She can always call with questions. She will see Dr. Baruch Gouty in 1 year.     Thea Silversmith, MD

## 2015-08-25 ENCOUNTER — Other Ambulatory Visit: Payer: Self-pay | Admitting: Family Medicine

## 2015-08-25 DIAGNOSIS — I10 Essential (primary) hypertension: Secondary | ICD-10-CM

## 2015-08-25 MED ORDER — LOSARTAN POTASSIUM-HCTZ 100-25 MG PO TABS: 1.0000 | ORAL_TABLET | Freq: Every day | ORAL | Status: DC

## 2015-08-25 MED ORDER — METOPROLOL TARTRATE 25 MG PO TABS
25.0000 mg | ORAL_TABLET | Freq: Two times a day (BID) | ORAL | Status: DC
Start: 2015-08-25 — End: 2016-06-29

## 2015-08-25 MED ORDER — PRAVASTATIN SODIUM 20 MG PO TABS
20.0000 mg | ORAL_TABLET | Freq: Every day | ORAL | Status: DC
Start: 2015-08-25 — End: 2015-12-30

## 2015-08-25 MED ORDER — AMLODIPINE BESYLATE 5 MG PO TABS: 5.0000 mg | ORAL_TABLET | Freq: Every day | ORAL | Status: DC

## 2015-08-26 ENCOUNTER — Telehealth: Payer: Self-pay | Admitting: Family Medicine

## 2015-08-26 DIAGNOSIS — I10 Essential (primary) hypertension: Secondary | ICD-10-CM

## 2015-08-26 MED ORDER — LOSARTAN POTASSIUM-HCTZ 100-25 MG PO TABS: 1.0000 | ORAL_TABLET | Freq: Every day | ORAL | Status: DC

## 2015-08-26 NOTE — Telephone Encounter (Signed)
Go ahead and send in 10 Losartan-HCTZ , 100-25.  To take 1 each day.

## 2015-08-26 NOTE — Telephone Encounter (Signed)
Pt called states that she need enough  BP med's until next Tuesday Losartan  100-25  [facility]  [place]  Her mail order should be in on  Tuesday  Pt. Call back  # is  [phone].

## 2015-08-26 NOTE — Telephone Encounter (Signed)
rx sent #[id number].

## 2015-08-31 ENCOUNTER — Encounter: Payer: Self-pay | Admitting: General Surgery

## 2015-08-31 DIAGNOSIS — Z853 Personal history of malignant neoplasm of breast: Secondary | ICD-10-CM | POA: Diagnosis not present

## 2015-08-31 DIAGNOSIS — R922 Inconclusive mammogram: Secondary | ICD-10-CM | POA: Diagnosis not present

## 2015-08-31 DIAGNOSIS — R928 Other abnormal and inconclusive findings on diagnostic imaging of breast: Secondary | ICD-10-CM | POA: Diagnosis not present

## 2015-09-06 ENCOUNTER — Encounter: Payer: Self-pay | Admitting: General Surgery

## 2015-09-06 ENCOUNTER — Ambulatory Visit (INDEPENDENT_AMBULATORY_CARE_PROVIDER_SITE_OTHER): Payer: Medicare Other | Admitting: General Surgery

## 2015-09-06 VITALS — BP 120/74 | HR 74 | Resp 12 | Ht 66.0 in | Wt 135.0 lb

## 2015-09-06 DIAGNOSIS — K921 Melena: Secondary | ICD-10-CM | POA: Diagnosis not present

## 2015-09-06 DIAGNOSIS — C50312 Malignant neoplasm of lower-inner quadrant of left female breast: Secondary | ICD-10-CM

## 2015-09-06 DIAGNOSIS — Z853 Personal history of malignant neoplasm of breast: Secondary | ICD-10-CM | POA: Diagnosis not present

## 2015-09-06 NOTE — Patient Instructions (Signed)
Patient to return in 6 months left diagnotic mammogram.    Continue self breast exams. Call office for any new breast issues or concerns.

## 2015-09-06 NOTE — Progress Notes (Signed)
Patient ID: Rebecca Mitchell, female   DOB: 08/08/1941, 74 y.o.   MRN: [id number]  Chief Complaint  Patient presents with  . Follow-up    mammogram    HPI Rebecca Mitchell is a 74 y.o. female who presents for a breast cancer follow up . The most recent mammogram was done on 08/31/15 .  Patient does perform regular self breast checks and gets regular mammograms done.  Denies any breast symptoms. Tolerating Letrazole. I have reviewed the history of present illness with the patient. HPI  Past Medical History  Diagnosis Date  . Hyperlipidemia   . Anemia   . Malignant neoplasm of lower-inner quadrant of left female breast (HCC) 07/2014  . Neoplasm of left breast   . Osteoporosis     Past Surgical History  Procedure Laterality Date  . Colonoscopy  2010  . Upper gi endoscopy  2014  . Breast surgery Left 10/01/2014    lumpectomy    Family History  Problem Relation Age of Onset  . Cancer Sister 68    breast  . Heart disease Mother   . Heart disease Father     Social History Social History  Substance Use Topics  . Smoking status: Never Smoker   . Smokeless tobacco: None  . Alcohol Use: No    No Known Allergies  Current Outpatient Prescriptions  Medication Sig Dispense Refill  . amLODipine (NORVASC) 5 MG tablet Take 1 tablet (5 mg total) by mouth daily. 30 tablet 12  . Ascorbic Acid (VITAMIN C) 1000 MG tablet Take 1,000 mg by mouth daily.    . Calcium Citrate-Vitamin D (CALCIUM CITRATE + PO) Take 1 tablet by mouth daily.    . co-enzyme Q-10 30 MG capsule Take 30 mg by mouth 3 (three) times daily.    . ferrous sulfate 325 (65 FE) MG tablet Take 325 mg by mouth daily with breakfast.    . letrozole (FEMARA) 2.5 MG tablet Take 1 tablet (2.5 mg total) by mouth daily. 30 tablet 12  . losartan-hydrochlorothiazide (HYZAAR) 100-25 MG tablet Take 1 tablet by mouth daily. 10 tablet 0  . Lutein-Zeaxanthin 15-0.7 MG CAPS Take 1 capsule by mouth daily.    . metoprolol tartrate  (LOPRESSOR) 25 MG tablet Take 1 tablet (25 mg total) by mouth 2 (two) times daily. 60 tablet 12  . Multiple Vitamin (MULTIVITAMIN) tablet Take 1 tablet by mouth daily.    . Omega-3 Fatty Acids (FISH OIL PO) Take 1 capsule by mouth daily.    . pravastatin (PRAVACHOL) 20 MG tablet Take 1 tablet (20 mg total) by mouth daily. 30 tablet 6   No current facility-administered medications for this visit.    Review of Systems Review of Systems  Constitutional: Negative.   Respiratory: Negative.   Cardiovascular: Negative.     Blood pressure 120/74, pulse 74, resp. rate 12, height 5' 6" (1.676 m), weight 135 lb (61.236 kg).  Physical Exam Physical Exam  Constitutional: She is oriented to person, place, and time. She appears well-developed and well-nourished.  Eyes: Conjunctivae are normal. No scleral icterus.  Neck: Neck supple.  Cardiovascular: Normal rate, regular rhythm and normal heart sounds.   Pulmonary/Chest: Effort normal and breath sounds normal. Right breast exhibits no inverted nipple, no mass, no nipple discharge, no skin change and no tenderness. Left breast exhibits no inverted nipple, no mass, no nipple discharge, no skin change and no tenderness.  Abdominal: Soft. Normal appearance and bowel sounds are normal. There is no hepatomegaly.   There is no tenderness. No hernia.  Lymphadenopathy:    She has no cervical adenopathy.    She has no axillary adenopathy.  Neurological: She is alert and oriented to person, place, and time.  Skin: Skin is warm and dry.    Data Reviewed Mammogram reviewed-no changes from last.  Assessment    Breast cancer left breast ,post lumpectomy and radiation.  Invasive CA, T1a,N0,M0, ER/PR pos,  Her2 neg. Mammogram and exam stable. Pt tolerating Letrazole.  6mos ago she had mild elevation of CEA, and trended down 1 mo later. Stool checks showed 1 out of 3 samples weakly pos for blood.      Plan    Repeat labs. If CEA is still high will plan for a  colonoscopy. Pt advised.  CA 27.29 and CEA ordered.  Patient to return in 6 months with left diagnotic mammogram.    PCP:  Hawkins Jr, James H  This information has been scribed by Jessica Qualls CMA. Jerod Mcquain G 09/06/2015, 9:12 AM    

## 2015-09-07 LAB — CANCER ANTIGEN 27.29: CA 27.29: 23.2 U/mL (ref 0.0–38.6)

## 2015-09-07 LAB — CEA: CEA: 7.7 ng/mL — ABNORMAL HIGH (ref 0.0–4.7)

## 2015-09-08 ENCOUNTER — Other Ambulatory Visit: Payer: Self-pay | Admitting: General Surgery

## 2015-09-08 ENCOUNTER — Telehealth: Payer: Self-pay

## 2015-09-08 MED ORDER — POLYETHYLENE GLYCOL 3350 17 GM/SCOOP PO POWD: 1.0000 | Freq: Once | ORAL | Status: DC

## 2015-09-08 NOTE — Telephone Encounter (Signed)
Message left for patient to call back for results and to schedule a Colonoscopy.

## 2015-09-08 NOTE — Telephone Encounter (Signed)
Spoke with patient and notified of results and let her know about having a colonoscopy done. She is amendable to this. The patient is scheduled for a Colonoscopy at [facility] on 09/21/15. She will only take her Losartan and Metoprolol at 6 am the morning of. They are aware to call the day before to get their arrival time. Miralax prescription has been sent into the patient's pharmacy. The patient is aware of date and instructions.

## 2015-09-08 NOTE — Telephone Encounter (Signed)
-----   Message from Christene Lye, MD sent at 09/07/2015  8:17 AM EST ----- CEA is still mildly elevated. Will proceed with colonoscopy.

## 2015-09-17 ENCOUNTER — Encounter: Payer: Self-pay | Admitting: *Deleted

## 2015-09-21 ENCOUNTER — Ambulatory Visit
Admission: RE | Admit: 2015-09-21 | Discharge: 2015-09-21 | Disposition: A | Discharge status: Home / Self Care | Payer: Medicare Other | Source: Ambulatory Visit | Attending: General Surgery | Admitting: General Surgery

## 2015-09-21 ENCOUNTER — Encounter
Admission: RE | Disposition: A | Discharge status: Home / Self Care | Payer: Self-pay | Source: Ambulatory Visit | Attending: General Surgery

## 2015-09-21 ENCOUNTER — Ambulatory Visit: Payer: Medicare Other | Admitting: Anesthesiology

## 2015-09-21 ENCOUNTER — Encounter: Payer: Self-pay | Admitting: Anesthesiology

## 2015-09-21 DIAGNOSIS — K648 Other hemorrhoids: Secondary | ICD-10-CM | POA: Diagnosis not present

## 2015-09-21 DIAGNOSIS — M810 Age-related osteoporosis without current pathological fracture: Secondary | ICD-10-CM | POA: Diagnosis not present

## 2015-09-21 DIAGNOSIS — K5730 Diverticulosis of large intestine without perforation or abscess without bleeding: Secondary | ICD-10-CM | POA: Diagnosis not present

## 2015-09-21 DIAGNOSIS — R970 Elevated carcinoembryonic antigen [CEA]: Secondary | ICD-10-CM | POA: Insufficient documentation

## 2015-09-21 DIAGNOSIS — K635 Polyp of colon: Secondary | ICD-10-CM | POA: Diagnosis not present

## 2015-09-21 DIAGNOSIS — D120 Benign neoplasm of cecum: Secondary | ICD-10-CM | POA: Insufficient documentation

## 2015-09-21 DIAGNOSIS — Z1211 Encounter for screening for malignant neoplasm of colon: Secondary | ICD-10-CM | POA: Diagnosis not present

## 2015-09-21 DIAGNOSIS — Z79899 Other long term (current) drug therapy: Secondary | ICD-10-CM | POA: Diagnosis not present

## 2015-09-21 DIAGNOSIS — Z803 Family history of malignant neoplasm of breast: Secondary | ICD-10-CM | POA: Diagnosis not present

## 2015-09-21 DIAGNOSIS — E785 Hyperlipidemia, unspecified: Secondary | ICD-10-CM | POA: Diagnosis not present

## 2015-09-21 DIAGNOSIS — K5790 Diverticulosis of intestine, part unspecified, without perforation or abscess without bleeding: Secondary | ICD-10-CM | POA: Diagnosis not present

## 2015-09-21 DIAGNOSIS — D649 Anemia, unspecified: Secondary | ICD-10-CM | POA: Diagnosis not present

## 2015-09-21 DIAGNOSIS — Z853 Personal history of malignant neoplasm of breast: Secondary | ICD-10-CM | POA: Insufficient documentation

## 2015-09-21 DIAGNOSIS — Z8249 Family history of ischemic heart disease and other diseases of the circulatory system: Secondary | ICD-10-CM | POA: Insufficient documentation

## 2015-09-21 DIAGNOSIS — K649 Unspecified hemorrhoids: Secondary | ICD-10-CM | POA: Diagnosis not present

## 2015-09-21 HISTORY — PX: COLONOSCOPY WITH PROPOFOL: SHX5780

## 2015-09-21 SURGERY — COLONOSCOPY WITH PROPOFOL
Anesthesia: General

## 2015-09-21 MED ORDER — PROPOFOL 500 MG/50ML IV EMUL
INTRAVENOUS | Status: DC | PRN
  Administered 2015-09-21: 100 ug/kg/min via INTRAVENOUS

## 2015-09-21 MED ORDER — SODIUM CHLORIDE 0.9 % IV SOLN
INTRAVENOUS | Status: DC | PRN
  Administered 2015-09-21: 14:00:00 via INTRAVENOUS

## 2015-09-21 MED ORDER — MIDAZOLAM HCL 2 MG/2ML IJ SOLN
INTRAMUSCULAR | Status: DC | PRN
  Administered 2015-09-21: 1 mg via INTRAVENOUS

## 2015-09-21 MED ORDER — LIDOCAINE HCL (CARDIAC) 20 MG/ML IV SOLN
INTRAVENOUS | Status: DC | PRN
  Administered 2015-09-21: 60 mg via INTRAVENOUS

## 2015-09-21 MED ORDER — PROPOFOL 10 MG/ML IV BOLUS: INTRAVENOUS | Status: DC | PRN

## 2015-09-21 MED ORDER — PROPOFOL 10 MG/ML IV BOLUS
INTRAVENOUS | Status: DC | PRN
Start: 2015-09-21 — End: 2015-09-21
  Administered 2015-09-21: 60 mg via INTRAVENOUS

## 2015-09-21 NOTE — H&P (View-Only) (Signed)
Patient ID: Rebecca Mitchell, female   DOB: Jul 21, 1941, 75 y.o.   MRN: [id number]  Chief Complaint  Patient presents with  . Follow-up    mammogram    HPI Rebecca Mitchell is a 75 y.o. female who presents for a breast cancer follow up . The most recent mammogram was done on 08/31/15 .  Patient does perform regular self breast checks and gets regular mammograms done.  Denies any breast symptoms. Tolerating Letrazole. I have reviewed the history of present illness with the patient. HPI  Past Medical History  Diagnosis Date  . Hyperlipidemia   . Anemia   . Malignant neoplasm of lower-inner quadrant of left female breast ([place]) 07/2014  . Neoplasm of left breast   . Osteoporosis     Past Surgical History  Procedure Laterality Date  . Colonoscopy  2010  . Upper gi endoscopy  2014  . Breast surgery Left 10/01/2014    lumpectomy    Family History  Problem Relation Age of Onset  . Cancer Sister 20    breast  . Heart disease Mother   . Heart disease Father     Social History Social History  Substance Use Topics  . Smoking status: Never Smoker   . Smokeless tobacco: None  . Alcohol Use: No    No Known Allergies  Current Outpatient Prescriptions  Medication Sig Dispense Refill  . amLODipine (NORVASC) 5 MG tablet Take 1 tablet (5 mg total) by mouth daily. 30 tablet 12  . Ascorbic Acid (VITAMIN C) 1000 MG tablet Take 1,000 mg by mouth daily.    . Calcium Citrate-Vitamin D (CALCIUM CITRATE + PO) Take 1 tablet by mouth daily.    Marland Kitchen co-enzyme Q-10 30 MG capsule Take 30 mg by mouth 3 (three) times daily.    . ferrous sulfate 325 (65 FE) MG tablet Take 325 mg by mouth daily with breakfast.    . letrozole (FEMARA) 2.5 MG tablet Take 1 tablet (2.5 mg total) by mouth daily. 30 tablet 12  . losartan-hydrochlorothiazide (HYZAAR) 100-25 MG tablet Take 1 tablet by mouth daily. 10 tablet 0  . Lutein-Zeaxanthin 15-0.7 MG CAPS Take 1 capsule by mouth daily.    . metoprolol tartrate  (LOPRESSOR) 25 MG tablet Take 1 tablet (25 mg total) by mouth 2 (two) times daily. 60 tablet 12  . Multiple Vitamin (MULTIVITAMIN) tablet Take 1 tablet by mouth daily.    . Omega-3 Fatty Acids (FISH OIL PO) Take 1 capsule by mouth daily.    . pravastatin (PRAVACHOL) 20 MG tablet Take 1 tablet (20 mg total) by mouth daily. 30 tablet 6   No current facility-administered medications for this visit.    Review of Systems Review of Systems  Constitutional: Negative.   Respiratory: Negative.   Cardiovascular: Negative.     Blood pressure 120/74, pulse 74, resp. rate 12, height 5' 6" (1.676 m), weight 135 lb (61.236 kg).  Physical Exam Physical Exam  Constitutional: She is oriented to person, place, and time. She appears well-developed and well-nourished.  Eyes: Conjunctivae are normal. No scleral icterus.  Neck: Neck supple.  Cardiovascular: Normal rate, regular rhythm and normal heart sounds.   Pulmonary/Chest: Effort normal and breath sounds normal. Right breast exhibits no inverted nipple, no mass, no nipple discharge, no skin change and no tenderness. Left breast exhibits no inverted nipple, no mass, no nipple discharge, no skin change and no tenderness.  Abdominal: Soft. Normal appearance and bowel sounds are normal. There is no hepatomegaly.  There is no tenderness. No hernia.  Lymphadenopathy:    She has no cervical adenopathy.    She has no axillary adenopathy.  Neurological: She is alert and oriented to person, place, and time.  Skin: Skin is warm and dry.    Data Reviewed Mammogram reviewed-no changes from last.  Assessment    Breast cancer left breast ,post lumpectomy and radiation.  Invasive CA, T1a,N0,M0, ER/PR pos,  Her2 neg. Mammogram and exam stable. Pt tolerating Letrazole.  40mo ago she had mild elevation of CEA, and trended down 1 mo later. Stool checks showed 1 out of 3 samples weakly pos for blood.      Plan    Repeat labs. If CEA is still high will plan for a  colonoscopy. Pt advised.  CA 27.29 and CEA ordered.  Patient to return in 6 months with left diagnotic mammogram.    PCP:  HPhilbert Riser JNikki Dom This information has been scribed by JGaspar ColaCMA. SANKAR,SEEPLAPUTHUR G 09/06/2015, 9:12 AM

## 2015-09-21 NOTE — Anesthesia Preprocedure Evaluation (Signed)
Anesthesia Evaluation  Patient identified by MRN, date of birth, ID band Patient awake    Reviewed: Allergy & Precautions, H&P , NPO status , Patient's Chart, lab work & pertinent test results  History of Anesthesia Complications Negative for: history of anesthetic complications  Airway Mallampati: III  TM Distance: >3 FB Neck ROM: limited    Dental no notable dental hx. (+) Teeth Intact   Pulmonary neg pulmonary ROS, neg shortness of breath,    Pulmonary exam normal breath sounds clear to auscultation       Cardiovascular Exercise Tolerance: Good hypertension, (-) angina(-) Past MI and (-) DOE Normal cardiovascular exam Rhythm:regular Rate:Normal     Neuro/Psych negative neurological ROS  negative psych ROS   GI/Hepatic negative GI ROS, Neg liver ROS,   Endo/Other  negative endocrine ROS  Renal/GU negative Renal ROS  negative genitourinary   Musculoskeletal   Abdominal   Peds  Hematology negative hematology ROS (+)   Anesthesia Other Findings Past Medical History:   Hyperlipidemia                                               Anemia                                                       Malignant neoplasm of lower-inner quadrant of * 07/2014      Neoplasm of left breast                                      Osteoporosis                                                Past Surgical History:   COLONOSCOPY                                      2010         UPPER GI ENDOSCOPY                               2014         BREAST SURGERY                                  Left 10/01/2014      Comment:lumpectomy     Reproductive/Obstetrics negative OB ROS                             Anesthesia Physical Anesthesia Plan  ASA: III  Anesthesia Plan: General   Post-op Pain Management:    Induction:   Airway Management Planned:   Additional Equipment:   Intra-op Plan:   Post-operative  Plan:   Informed Consent: I have reviewed the patients History and Physical,  chart, labs and discussed the procedure including the risks, benefits and alternatives for the proposed anesthesia with the patient or authorized representative who has indicated his/her understanding and acceptance.   Dental Advisory Given  Plan Discussed with: Anesthesiologist, CRNA and Surgeon  Anesthesia Plan Comments:         Anesthesia Quick Evaluation

## 2015-09-21 NOTE — Anesthesia Postprocedure Evaluation (Signed)
Anesthesia Post Note  Patient: Rebecca Mitchell  Procedure(s) Performed: Procedure(s) (LRB): COLONOSCOPY WITH PROPOFOL (N/A)  Patient location during evaluation: Endoscopy Anesthesia Type: General Level of consciousness: awake and alert Pain management: pain level controlled Vital Signs Assessment: post-procedure vital signs reviewed and stable Respiratory status: spontaneous breathing, nonlabored ventilation, respiratory function stable and patient connected to nasal cannula oxygen Cardiovascular status: blood pressure returned to baseline and stable Postop Assessment: no signs of nausea or vomiting Anesthetic complications: no    Last Vitals:  Filed Vitals:   09/21/15 1510 09/21/15 1520  BP: 119/74 120/76  Pulse: 69 73  Temp:    Resp: 17 19    Last Pain: There were no vitals filed for this visit.               Precious Haws Piscitello

## 2015-09-21 NOTE — Op Note (Signed)
[facility] Patient Name: Sommar Barbie Procedure Date: 09/21/2015 2:24 PM MRN: [id number] Account #: [id number] Date of Birth: 1941/01/17 Admit Type: Outpatient Age: 75 Room: [facility] ENDO ROOM 4 Gender: Female Note Status: Finalized Procedure:         Colonoscopy Indications:       Screening for colorectal malignant neoplasm Providers:         Seeplaputhur G. Jamal Collin, MD Referring MD:      Arlis Porta, MD (Referring MD) Medicines:         General Anesthesia Complications:     No immediate complications. Procedure:         Pre-Anesthesia Assessment:                    - General anesthesia under the supervision of an                     anesthesiologist was determined to be medically necessary                     for this procedure based on review of the patient's                     medical history, medications, and prior anesthesia history.                    After obtaining informed consent, the colonoscope was                     passed under direct vision. Throughout the procedure, the                     patient's blood pressure, pulse, and oxygen saturations                     were monitored continuously. The Colonoscope was                     introduced through the anus and advanced to the the cecum,                     identified by the ileocecal valve. The colonoscopy was                     performed without difficulty. The patient tolerated the                     procedure well. The quality of the bowel preparation was                     good. Findings:      The perianal and digital rectal examinations were normal.      A few diverticula were found in the sigmoid colon.      A 3 mm polyp was found in the cecum. The polyp was sessile. The polyp       was removed with a jumbo cold forceps. Resection and retrieval were       complete.      Internal hemorrhoids were found during retroflexion. The hemorrhoids       were mild.  The exam was otherwise without abnormality on direct and retroflexion       views. Impression:        - Diverticulosis in the sigmoid colon.                    -  One 3 mm polyp in the cecum. Resected and retrieved.                    - Internal hemorrhoids.                    - The examination was otherwise normal on direct and                     retroflexion views. Recommendation:    - Discharge patient to home.                    - Await pathology results.                    - Return to my office as previously scheduled. Procedure Code(s): --- Professional ---                    [phone], Colonoscopy, flexible; with biopsy, single or                     multiple Diagnosis Code(s): --- Professional ---                    Z12.11, Encounter for screening for malignant neoplasm of                     colon                    K64.8, Other hemorrhoids                    D12.0, Benign neoplasm of cecum                    K57.30, Diverticulosis of large intestine without                     perforation or abscess without bleeding CPT copyright 2014 American Medical Association. All rights reserved. The codes documented in this report are preliminary and upon coder review may  be revised to meet current compliance requirements. Christene Lye, MD 09/21/2015 2:55:09 PM This report has been signed electronically. Number of Addenda: 0 Note Initiated On: 09/21/2015 2:24 PM Scope Withdrawal Time: 0 hours 4 minutes 43 seconds  Total Procedure Duration: 0 hours 19 minutes 51 seconds       [facility]

## 2015-09-21 NOTE — Transfer of Care (Signed)
Immediate Anesthesia Transfer of Care Note  Patient: Rebecca Mitchell  Procedure(s) Performed: Procedure(s): COLONOSCOPY WITH PROPOFOL (N/A)  Patient Location: PACU  Anesthesia Type:General  Level of Consciousness: awake and patient cooperative  Airway & Oxygen Therapy: Patient Spontanous Breathing and Patient connected to nasal cannula oxygen  Post-op Assessment: Report given to RN and Post -op Vital signs reviewed and stable  Post vital signs: Reviewed and stable  Last Vitals:  Filed Vitals:   09/21/15 1454 09/21/15 1456  BP: 107/52 107/52  Pulse: 70 69  Temp: 36.9 C 98.32F  Resp: 20 19    Complications: No apparent anesthesia complications

## 2015-09-21 NOTE — Interval H&P Note (Signed)
History and Physical Interval Note:  09/21/2015 2:23 PM  Rebecca Mitchell  has presented today for surgery, with the diagnosis of ELEVATED CEA  The various methods of treatment have been discussed with the patient and family. After consideration of risks, benefits and other options for treatment, the patient has consented to  Procedure(s): COLONOSCOPY WITH PROPOFOL (N/A) as a surgical intervention .  The patient's history has been reviewed, patient examined, no change in status, stable for surgery.  I have reviewed the patient's chart and labs.  Questions were answered to the patient's satisfaction.     Geoffery Aultman G

## 2015-09-22 ENCOUNTER — Encounter: Payer: Self-pay | Admitting: General Surgery

## 2015-09-23 ENCOUNTER — Telehealth: Payer: Self-pay | Admitting: *Deleted

## 2015-09-23 LAB — SURGICAL PATHOLOGY

## 2015-09-23 NOTE — Telephone Encounter (Signed)
-----   Message from Christene Lye, MD sent at 09/23/2015 10:49 AM EST ----- Please let pt pt know the pathology was normal.

## 2015-09-23 NOTE — Telephone Encounter (Signed)
Patient is aware and pleased about her pathology results.

## 2015-09-27 ENCOUNTER — Ambulatory Visit: Payer: Medicare Other | Admitting: Family Medicine

## 2015-10-04 ENCOUNTER — Encounter: Payer: Self-pay | Admitting: Family Medicine

## 2015-10-04 ENCOUNTER — Ambulatory Visit (INDEPENDENT_AMBULATORY_CARE_PROVIDER_SITE_OTHER): Payer: Medicare Other | Admitting: Family Medicine

## 2015-10-04 VITALS — BP 115/73 | HR 60 | Resp 16 | Ht 66.0 in | Wt 134.0 lb

## 2015-10-04 DIAGNOSIS — E785 Hyperlipidemia, unspecified: Secondary | ICD-10-CM | POA: Diagnosis not present

## 2015-10-04 DIAGNOSIS — I10 Essential (primary) hypertension: Secondary | ICD-10-CM | POA: Diagnosis not present

## 2015-10-04 DIAGNOSIS — C50012 Malignant neoplasm of nipple and areola, left female breast: Secondary | ICD-10-CM | POA: Diagnosis not present

## 2015-10-04 NOTE — Progress Notes (Signed)
Name: Rebecca Mitchell   MRN: [id number]    DOB: 04-07-1941   Date:10/04/2015       Progress Note  Subjective  Chief Complaint  Chief Complaint  Patient presents with  . Hypertension  . Leg Pain    cramps 2-3 days    HPI Here for f/u of HBP.  Has had elevated CEA recently, but normal Cancer antigen 27.29.  Also had a normal colonoscopy by Dr. Jamal Collin 2 weeks ago.  Neg  Bx. For cancer.  Feeling well except for occasional leg cramps at night.  This usually only about 1/month at most.  No problem-specific assessment & plan notes found for this encounter.   Past Medical History  Diagnosis Date  . Hyperlipidemia   . Anemia   . Malignant neoplasm of lower-inner quadrant of left female breast ([place]) 07/2014  . Neoplasm of left breast   . Osteoporosis     Past Surgical History  Procedure Laterality Date  . Colonoscopy  2010  . Upper gi endoscopy  2014  . Breast surgery Left 10/01/2014    lumpectomy  . Colonoscopy with propofol N/A 09/21/2015    Procedure: COLONOSCOPY WITH PROPOFOL;  Surgeon: Christene Lye, MD;  Location: [facility] ENDOSCOPY;  Service: Endoscopy;  Laterality: N/A;    Family History  Problem Relation Age of Onset  . Cancer Sister 46    breast  . Heart disease Mother   . Heart disease Father     Social History   Social History  . Marital Status: Married    Spouse Name: N/A  . Number of Children: N/A  . Years of Education: N/A   Occupational History  . Not on file.   Social History Main Topics  . Smoking status: Never Smoker   . Smokeless tobacco: Not on file  . Alcohol Use: No  . Drug Use: No  . Sexual Activity: Not on file   Other Topics Concern  . Not on file   Social History Narrative     Current outpatient prescriptions:  .  amLODipine (NORVASC) 5 MG tablet, Take 1 tablet (5 mg total) by mouth daily., Disp: 30 tablet, Rfl: 12 .  Ascorbic Acid (VITAMIN C) 1000 MG tablet, Take 1,000 mg by mouth daily., Disp: , Rfl:  .  Calcium Citrate-Vitamin  D (CALCIUM CITRATE + PO), Take 1 tablet by mouth daily., Disp: , Rfl:  .  co-enzyme Q-10 30 MG capsule, Take 30 mg by mouth 3 (three) times daily., Disp: , Rfl:  .  ferrous sulfate 325 (65 FE) MG tablet, Take 325 mg by mouth daily with breakfast., Disp: , Rfl:  .  letrozole (FEMARA) 2.5 MG tablet, Take 1 tablet (2.5 mg total) by mouth daily., Disp: 30 tablet, Rfl: 12 .  losartan-hydrochlorothiazide (HYZAAR) 100-25 MG tablet, Take 1 tablet by mouth daily., Disp: 10 tablet, Rfl: 0 .  Lutein-Zeaxanthin 15-0.7 MG CAPS, Take 1 capsule by mouth daily., Disp: , Rfl:  .  metoprolol tartrate (LOPRESSOR) 25 MG tablet, Take 1 tablet (25 mg total) by mouth 2 (two) times daily., Disp: 60 tablet, Rfl: 12 .  Multiple Vitamin (MULTIVITAMIN) tablet, Take 1 tablet by mouth daily., Disp: , Rfl:  .  Omega-3 Fatty Acids (FISH OIL PO), Take 1 capsule by mouth daily., Disp: , Rfl:  .  polyethylene glycol powder (GLYCOLAX/MIRALAX) powder, Take 255 g by mouth once. Mix with 64 ounces or 2 quarts of clear liquids. Drink 8 ounces or 1 cup every 10-15 minutes until gone,  then may resume clear liquid diet., Disp: 255 g, Rfl: 0 .  pravastatin (PRAVACHOL) 20 MG tablet, Take 1 tablet (20 mg total) by mouth daily., Disp: 30 tablet, Rfl: 6  No Known Allergies   Review of Systems  Constitutional: Negative for fever, chills, weight loss and malaise/fatigue.  HENT: Negative for hearing loss.   Eyes: Negative for blurred vision and double vision.  Respiratory: Negative for cough, shortness of breath and wheezing.   Cardiovascular: Negative for chest pain, palpitations and leg swelling.  Gastrointestinal: Negative for heartburn, nausea, vomiting, abdominal pain, diarrhea and blood in stool.  Genitourinary: Negative for dysuria, urgency and frequency.  Musculoskeletal: Positive for myalgias (rare leg muscle cramps) and back pain (scoliosis).  Skin: Negative for itching and rash.  Neurological: Negative for dizziness, tremors,  weakness and headaches.  Psychiatric/Behavioral: Negative for depression. The patient is not nervous/anxious and does not have insomnia.       Objective  Filed Vitals:   10/04/15 1506  BP: 115/73  Pulse: 60  Resp: 16  Height: 5\' 6"  (1.676 m)  Weight: 134 lb (60.782 kg)    Physical Exam  Constitutional: She is oriented to person, place, and time and well-developed, well-nourished, and in no distress. No distress.  HENT:  Head: Normocephalic and atraumatic.  Eyes: Conjunctivae and EOM are normal. Pupils are equal, round, and reactive to light. No scleral icterus.  Neck: Normal range of motion. Neck supple. Carotid bruit is not present. No thyromegaly present.  Cardiovascular: Normal rate, regular rhythm and normal heart sounds.  Exam reveals no gallop and no friction rub.   No murmur heard. Pulmonary/Chest: Effort normal and breath sounds normal. No respiratory distress. She has no wheezes. She has no rales.  Abdominal: Soft. Bowel sounds are normal. She exhibits no distension. There is no tenderness. There is no rebound.  Musculoskeletal: She exhibits no edema.  Lymphadenopathy:    She has no cervical adenopathy.  Neurological: She is alert and oriented to person, place, and time.  Vitals reviewed.      Recent Results (from the past 2160 hour(s))  Cancer antigen 27.29     Status: None   Collection Time: 09/06/15  9:18 AM  Result Value Ref Range   CA 27.29 23.2 0.0 - 38.6 U/mL    Comment: Bayer Centaur/ACS methodology  CEA     Status: Abnormal   Collection Time: 09/06/15  9:18 AM  Result Value Ref Range   CEA 7.7 (H) 0.0 - 4.7 ng/mL    Comment:        Roche ECLIA methodology       Nonsmokers  <3.9                                      Smokers     <5.6   Surgical pathology     Status: None   Collection Time: 09/21/15  2:43 PM  Result Value Ref Range   SURGICAL PATHOLOGY      Surgical Pathology CASE: [id number] PATIENT: [facility] Surgical [facility]ology  Report     SPECIMEN SUBMITTED: A. Ileocecal valve polyp, cold bx  CLINICAL HISTORY: Elevated CEA; Colon polyps, diverticulosis, hemorrhoids  PRE-OPERATIVE DIAGNOSIS:   POST-OPERATIVE DIAGNOSIS:      DIAGNOSIS: A. ILEOCECAL VALVE POLYP; COLD BIOPSY: - COLONIC MUCOSA WITHIN NORMAL LIMITS.   GROSS DESCRIPTION: A. Labeled: Cold biopsy of ileocecal valve polyp  Tissue fragment(s): 1  Size: 0.3 cm  Description: Tan tissue fragment  Entirely submitted in one cassette(s).     Final Diagnosis performed by Delorse Lek, MD.  Electronically signed 09/23/2015 10:26:36AM    The electronic signature indicates that the named Attending Pathologist has evaluated the specimen  Technical component performed at [facility], [address], [place], [place] [address] Lab: [phone] Dir: Darrick Penna. Evette Doffing, MD  Professional component performed at [facility], [place], [place], [place] [address] Lab: [phone] Dir: Dellia Nims. Rubinas, MD       Assessment & Plan  Problem List Items Addressed This Visit      Cardiovascular and Mediastinum   BP (high blood pressure) - Primary     Other   Malignant neoplasm of breast (female) ([place])   HLD (hyperlipidemia)      No orders of the defined types were placed in this encounter.   1. Essential hypertension Cont. Current meds  2. HLD (hyperlipidemia) Cont. Current med  3. Malignant neoplasm of areola of left breast in female [facility]) Cont to see Dr. Jamal Collin about her elevated CEA.

## 2015-10-04 NOTE — Patient Instructions (Signed)
See Dr. Jamal Collin about follow up of CEA

## 2015-10-13 ENCOUNTER — Telehealth: Payer: Self-pay | Admitting: *Deleted

## 2015-10-13 NOTE — Telephone Encounter (Signed)
She had called asking if there was anything else that needed to be done since her CEA was 7.7 and her colonoscopy was good, like repeat CEA and if so when?

## 2015-10-14 NOTE — Telephone Encounter (Signed)
She is aware that it may be repeated at her June office visit. The patient is aware to call back for any questions or concerns.

## 2015-12-08 ENCOUNTER — Encounter: Payer: Self-pay | Admitting: *Deleted

## 2015-12-30 ENCOUNTER — Other Ambulatory Visit: Payer: Self-pay | Admitting: Family Medicine

## 2016-02-07 ENCOUNTER — Encounter: Payer: Self-pay | Admitting: Family Medicine

## 2016-02-07 ENCOUNTER — Ambulatory Visit (INDEPENDENT_AMBULATORY_CARE_PROVIDER_SITE_OTHER): Payer: Medicare Other | Admitting: Family Medicine

## 2016-02-07 VITALS — BP 130/70 | HR 69 | Temp 97.6°F | Resp 16 | Ht 66.0 in | Wt 137.0 lb

## 2016-02-07 DIAGNOSIS — L989 Disorder of the skin and subcutaneous tissue, unspecified: Secondary | ICD-10-CM

## 2016-02-07 DIAGNOSIS — C50912 Malignant neoplasm of unspecified site of left female breast: Secondary | ICD-10-CM

## 2016-02-07 DIAGNOSIS — E785 Hyperlipidemia, unspecified: Secondary | ICD-10-CM

## 2016-02-07 DIAGNOSIS — I10 Essential (primary) hypertension: Secondary | ICD-10-CM

## 2016-02-07 DIAGNOSIS — M419 Scoliosis, unspecified: Secondary | ICD-10-CM

## 2016-02-07 MED ORDER — PRAVASTATIN SODIUM 20 MG PO TABS: 20.0000 mg | ORAL_TABLET | Freq: Every day | ORAL | Status: DC

## 2016-02-07 NOTE — Progress Notes (Signed)
Name: Rebecca Mitchell   MRN: [id number]    DOB: [birth year]   Date:02/07/2016       Progress Note  Subjective  Chief Complaint  Chief Complaint  Patient presents with  . Hypertension  . Hyperlipidemia    HPI Here for f/u of HBP ane elevated lipids.  Ha hx.of breast carcinoma (L).  Sees Dr. Jamal Collin for that.  Feeling well and taking all of her meds.  No problem-specific assessment & plan notes found for this encounter.   Past Medical History  Diagnosis Date  . Hyperlipidemia   . Anemia   . Malignant neoplasm of lower-inner quadrant of left female breast ([place]) 07/2014  . Neoplasm of left breast   . Osteoporosis     Past Surgical History  Procedure Laterality Date  . Colonoscopy  2010  . Upper gi endoscopy  2014  . Breast surgery Left 10/01/2014    lumpectomy  . Colonoscopy with propofol N/A 09/21/2015    Procedure: COLONOSCOPY WITH PROPOFOL;  Surgeon: Christene Lye, MD;  Location: [facility] ENDOSCOPY;  Service: Endoscopy;  Laterality: N/A;    Family History  Problem Relation Age of Onset  . Cancer Sister 26    breast  . Heart disease Mother   . Heart disease Father     Social History   Social History  . Marital Status: Married    Spouse Name: N/A  . Number of Children: N/A  . Years of Education: N/A   Occupational History  . Not on file.   Social History Main Topics  . Smoking status: Never Smoker   . Smokeless tobacco: Never Used  . Alcohol Use: No  . Drug Use: No  . Sexual Activity: Not on file   Other Topics Concern  . Not on file   Social History Narrative     Current outpatient prescriptions:  .  amLODipine (NORVASC) 5 MG tablet, Take 1 tablet (5 mg total) by mouth daily., Disp: 30 tablet, Rfl: 12 .  Ascorbic Acid (VITAMIN C) 1000 MG tablet, Take 1,000 mg by mouth daily., Disp: , Rfl:  .  Calcium Citrate-Vitamin D (CALCIUM CITRATE + PO), Take 1 tablet by mouth daily., Disp: , Rfl:  .  co-enzyme Q-10 30 MG capsule, Take 30 mg by mouth 3 (three)  times daily., Disp: , Rfl:  .  ferrous sulfate 325 (65 FE) MG tablet, Take 325 mg by mouth daily with breakfast., Disp: , Rfl:  .  letrozole (FEMARA) 2.5 MG tablet, Take 1 tablet (2.5 mg total) by mouth daily., Disp: 30 tablet, Rfl: 12 .  losartan-hydrochlorothiazide (HYZAAR) 100-25 MG tablet, Take 1 tablet by mouth daily., Disp: 10 tablet, Rfl: 0 .  Lutein-Zeaxanthin 15-0.7 MG CAPS, Take 1 capsule by mouth daily., Disp: , Rfl:  .  metoprolol tartrate (LOPRESSOR) 25 MG tablet, Take 1 tablet (25 mg total) by mouth 2 (two) times daily., Disp: 60 tablet, Rfl: 12 .  Multiple Vitamin (MULTIVITAMIN) tablet, Take 1 tablet by mouth daily., Disp: , Rfl:  .  Omega-3 Fatty Acids (FISH OIL PO), Take 1 capsule by mouth daily., Disp: , Rfl:  .  pravastatin (PRAVACHOL) 20 MG tablet, Take 1 tablet (20 mg total) by mouth daily., Disp: 90 tablet, Rfl: 3 .  Saccharomyces boulardii (PROBIOTIC) 250 MG CAPS, Take 250 mg by mouth daily., Disp: , Rfl:   Not on File   Review of Systems  Constitutional: Negative for fever, chills, weight loss and malaise/fatigue.  HENT: Negative for hearing loss.  Eyes: Negative for blurred vision and double vision.  Respiratory: Negative for cough, shortness of breath and wheezing.   Cardiovascular: Negative for chest pain, palpitations and leg swelling.  Gastrointestinal: Negative for heartburn, abdominal pain and blood in stool.  Genitourinary: Negative for dysuria, urgency and frequency.  Musculoskeletal: Negative for myalgias and joint pain.  Skin: Negative for rash.  Neurological: Negative for dizziness, tremors, weakness and headaches.      Objective  Filed Vitals:   02/07/16 1330 02/07/16 1345  BP: 134/74 130/70  Pulse: 69   Temp: 97.6 F (36.4 C)   TempSrc: Oral   Resp: 16   Height: 5\' 6"  (1.676 m)   Weight: 137 lb (62.143 kg)     Physical Exam  Constitutional: She is oriented to person, place, and time and well-developed, well-nourished, and in no  distress. No distress.  HENT:  Head: Normocephalic and atraumatic.  Eyes: Conjunctivae and EOM are normal. Pupils are equal, round, and reactive to light. No scleral icterus.  Neck: Normal range of motion. Neck supple. Carotid bruit is not present. No thyromegaly present.  Cardiovascular: Normal rate, regular rhythm and normal heart sounds.  Exam reveals no gallop and no friction rub.   No murmur heard. Pulmonary/Chest: Effort normal and breath sounds normal. No respiratory distress. She has no wheezes. She has no rales.  Abdominal: Bowel sounds are normal. She exhibits no distension, no abdominal bruit and no mass. There is no tenderness.  Musculoskeletal: She exhibits no edema.  Back with moderately severe thoracic scoliosis, concave to the L.  Lymphadenopathy:    She has no cervical adenopathy.  Neurological: She is alert and oriented to person, place, and time.  Skin:  1.5 cm red flat irregular lesion on R lat. Calf.  Vitals reviewed.      No results found for this or any previous visit (from the past 2160 hour(s)).   Assessment & Plan  Problem List Items Addressed This Visit      Cardiovascular and Mediastinum   BP (high blood pressure) - Primary   Relevant Medications   pravastatin (PRAVACHOL) 20 MG tablet   Other Relevant Orders   Comprehensive Metabolic Panel (CMET)     Musculoskeletal and Integument   Scoliosis     Other   Malignant neoplasm of breast (female) ([place])   Relevant Orders   CBC with Differential   HLD (hyperlipidemia)   Relevant Medications   pravastatin (PRAVACHOL) 20 MG tablet   Other Relevant Orders   Lipid Profile    Other Visit Diagnoses    Leg skin lesion, left        Relevant Orders    Ambulatory referral to Dermatology       Meds ordered this encounter  Medications  . Saccharomyces boulardii (PROBIOTIC) 250 MG CAPS    Sig: Take 250 mg by mouth daily.  . pravastatin (PRAVACHOL) 20 MG tablet    Sig: Take 1 tablet (20 mg total) by  mouth daily.    Dispense:  90 tablet    Refill:  3    1. Essential hypertension conmt Am lodipine, Hyzaar and Metoprolol - Comprehensive Metabolic Panel (CMET)  2. HLD (hyperlipidemia)  - pravastatin (PRAVACHOL) 20 MG tablet; Take 1 tablet (20 mg total) by mouth daily.  Dispense: 90 tablet; Refill: 3 - Lipid Profile  3. Scoliosis   4. Malignant neoplasm of left female breast, unspecified site of breast ([place]) Cont. To f/u Dr. Jamal Collin - CBC with Differential  5. Leg skin lesion,  left  - Ambulatory referral to Dermatology

## 2016-02-08 DIAGNOSIS — E785 Hyperlipidemia, unspecified: Secondary | ICD-10-CM | POA: Diagnosis not present

## 2016-02-08 DIAGNOSIS — I10 Essential (primary) hypertension: Secondary | ICD-10-CM | POA: Diagnosis not present

## 2016-02-08 DIAGNOSIS — C50912 Malignant neoplasm of unspecified site of left female breast: Secondary | ICD-10-CM | POA: Diagnosis not present

## 2016-02-09 LAB — COMPREHENSIVE METABOLIC PANEL
ALBUMIN: 4 g/dL (ref 3.5–4.8)
ALT: 19 IU/L (ref 0–32)
AST: 25 IU/L (ref 0–40)
Albumin/Globulin Ratio: 1.9 (ref 1.2–2.2)
Alkaline Phosphatase: 57 IU/L (ref 39–117)
BILIRUBIN TOTAL: 0.7 mg/dL (ref 0.0–1.2)
BUN / CREAT RATIO: 28 (ref 12–28)
BUN: 22 mg/dL (ref 8–27)
CALCIUM: 9.8 mg/dL (ref 8.7–10.3)
CHLORIDE: 102 mmol/L (ref 96–106)
CO2: 24 mmol/L (ref 18–29)
CREATININE: 0.8 mg/dL (ref 0.57–1.00)
GFR calc non Af Amer: 73 mL/min/{1.73_m2} (ref >59)
GFR, EST AFRICAN AMERICAN: 84 mL/min/{1.73_m2} (ref >59)
GLUCOSE: 90 mg/dL (ref 65–99)
Globulin, Total: 2.1 g/dL (ref 1.5–4.5)
Potassium: 4.6 mmol/L (ref 3.5–5.2)
Sodium: 142 mmol/L (ref 134–144)
TOTAL PROTEIN: 6.1 g/dL (ref 6.0–8.5)

## 2016-02-09 LAB — CBC WITH DIFFERENTIAL/PLATELET
BASOS ABS: 0.1 10*3/uL (ref 0.0–0.2)
Basos: 2 %
EOS (ABSOLUTE): 0.2 10*3/uL (ref 0.0–0.4)
EOS: 4 %
HEMATOCRIT: 36.4 % (ref 34.0–46.6)
HEMOGLOBIN: 12.5 g/dL (ref 11.1–15.9)
IMMATURE GRANS (ABS): 0 10*3/uL (ref 0.0–0.1)
Immature Granulocytes: 0 %
LYMPHS: 35 %
Lymphocytes Absolute: 1.3 10*3/uL (ref 0.7–3.1)
MCH: 31.6 pg (ref 26.6–33.0)
MCHC: 34.3 g/dL (ref 31.5–35.7)
MCV: 92 fL (ref 79–97)
MONOCYTES: 13 %
Monocytes Absolute: 0.5 10*3/uL (ref 0.1–0.9)
NEUTROS ABS: 1.7 10*3/uL (ref 1.4–7.0)
Neutrophils: 46 %
Platelets: 188 10*3/uL (ref 150–379)
RBC: 3.96 x10E6/uL (ref 3.77–5.28)
RDW: 13.4 % (ref 12.3–15.4)
WBC: 3.7 10*3/uL (ref 3.4–10.8)

## 2016-02-09 LAB — LIPID PANEL
CHOLESTEROL TOTAL: 165 mg/dL (ref 100–199)
Chol/HDL Ratio: 2.1 ratio units (ref 0.0–4.4)
HDL: 79 mg/dL (ref >39)
LDL CALC: 75 mg/dL (ref 0–99)
TRIGLYCERIDES: 56 mg/dL (ref 0–149)
VLDL CHOLESTEROL CAL: 11 mg/dL (ref 5–40)

## 2016-02-15 ENCOUNTER — Telehealth: Payer: Self-pay | Admitting: Family Medicine

## 2016-02-15 NOTE — Telephone Encounter (Signed)
Copy printed.[place] Attempted to call patient to notify but no vmail set up.

## 2016-02-15 NOTE — Telephone Encounter (Signed)
Pt would like to pick up a copy of lab results.  Her call back number is [phone]

## 2016-02-29 ENCOUNTER — Encounter: Payer: Self-pay | Admitting: General Surgery

## 2016-02-29 DIAGNOSIS — R928 Other abnormal and inconclusive findings on diagnostic imaging of breast: Secondary | ICD-10-CM | POA: Diagnosis not present

## 2016-02-29 DIAGNOSIS — C50312 Malignant neoplasm of lower-inner quadrant of left female breast: Secondary | ICD-10-CM | POA: Diagnosis not present

## 2016-02-29 DIAGNOSIS — R922 Inconclusive mammogram: Secondary | ICD-10-CM | POA: Diagnosis not present

## 2016-03-14 ENCOUNTER — Encounter: Payer: Self-pay | Admitting: *Deleted

## 2016-03-14 ENCOUNTER — Ambulatory Visit: Payer: Medicare Other | Admitting: General Surgery

## 2016-03-15 ENCOUNTER — Ambulatory Visit (INDEPENDENT_AMBULATORY_CARE_PROVIDER_SITE_OTHER): Payer: Medicare Other | Admitting: Family Medicine

## 2016-03-15 ENCOUNTER — Encounter: Payer: Self-pay | Admitting: Family Medicine

## 2016-03-15 VITALS — BP 137/79 | HR 70 | Temp 98.3°F | Resp 16 | Ht 66.0 in | Wt 136.8 lb

## 2016-03-15 DIAGNOSIS — J069 Acute upper respiratory infection, unspecified: Secondary | ICD-10-CM | POA: Diagnosis not present

## 2016-03-15 DIAGNOSIS — R059 Cough, unspecified: Secondary | ICD-10-CM

## 2016-03-15 DIAGNOSIS — R05 Cough: Secondary | ICD-10-CM | POA: Diagnosis not present

## 2016-03-15 MED ORDER — DM-GUAIFENESIN ER 30-600 MG PO TB12: 1.0000 | ORAL_TABLET | Freq: Two times a day (BID) | ORAL | Status: DC | PRN

## 2016-03-15 MED ORDER — LORATADINE 10 MG PO TABS: 10.0000 mg | ORAL_TABLET | Freq: Every day | ORAL | Status: DC | PRN

## 2016-03-15 MED ORDER — AZITHROMYCIN 250 MG PO TABS: ORAL_TABLET | ORAL | Status: DC

## 2016-03-15 NOTE — Progress Notes (Signed)
Name: Rebecca Mitchell   MRN: [id number]    DOB: 03/07/1941   Date:03/15/2016       Progress Note  Subjective  Chief Complaint  Chief Complaint  Patient presents with  . Sore Throat    x3 days  . Nasal Congestion  . Cough    HPI  Here c/o sore throat and nasal and chest congestion.  Sx started 3 days ago.  Cough non-productive mainly.  No fever.  Some PND.  Throat sore with swallowing. No problem-specific assessment & plan notes found for this encounter.   Past Medical History  Diagnosis Date  . Hyperlipidemia   . Anemia   . Malignant neoplasm of lower-inner quadrant of left female breast ([place]) 07/2014  . Neoplasm of left breast   . Osteoporosis     Social History  Substance Use Topics  . Smoking status: Never Smoker   . Smokeless tobacco: Never Used  . Alcohol Use: No     Current outpatient prescriptions:  .  amLODipine (NORVASC) 5 MG tablet, Take 1 tablet (5 mg total) by mouth daily., Disp: 30 tablet, Rfl: 12 .  Ascorbic Acid (VITAMIN C) 1000 MG tablet, Take 1,000 mg by mouth daily., Disp: , Rfl:  .  Calcium Citrate-Vitamin D (CALCIUM CITRATE + PO), Take 1 tablet by mouth daily., Disp: , Rfl:  .  co-enzyme Q-10 30 MG capsule, Take 30 mg by mouth 3 (three) times daily., Disp: , Rfl:  .  ferrous sulfate 325 (65 FE) MG tablet, Take 325 mg by mouth daily with breakfast., Disp: , Rfl:  .  letrozole (FEMARA) 2.5 MG tablet, Take 1 tablet (2.5 mg total) by mouth daily., Disp: 30 tablet, Rfl: 12 .  losartan-hydrochlorothiazide (HYZAAR) 100-25 MG tablet, Take 1 tablet by mouth daily., Disp: 10 tablet, Rfl: 0 .  Lutein-Zeaxanthin 15-0.7 MG CAPS, Take 1 capsule by mouth daily., Disp: , Rfl:  .  metoprolol tartrate (LOPRESSOR) 25 MG tablet, Take 1 tablet (25 mg total) by mouth 2 (two) times daily., Disp: 60 tablet, Rfl: 12 .  Multiple Vitamin (MULTIVITAMIN) tablet, Take 1 tablet by mouth daily., Disp: , Rfl:  .  Omega-3 Fatty Acids (FISH OIL PO), Take 1 capsule by mouth daily., Disp:  , Rfl:  .  pravastatin (PRAVACHOL) 20 MG tablet, Take 1 tablet (20 mg total) by mouth daily., Disp: 90 tablet, Rfl: 3 .  Saccharomyces boulardii (PROBIOTIC) 250 MG CAPS, Take 250 mg by mouth daily., Disp: , Rfl:  .  azithromycin (ZITHROMAX) 250 MG tablet, Take 2 tabs on day 1, then 1 tab daily on days 2-5, Disp: 6 tablet, Rfl: 0 .  dextromethorphan-guaiFENesin (MUCINEX DM) 30-600 MG 12hr tablet, Take 1 tablet by mouth 2 (two) times daily as needed for cough., Disp: 20 tablet, Rfl: 0 .  loratadine (CLARITIN) 10 MG tablet, Take 1 tablet (10 mg total) by mouth daily as needed for rhinitis., Disp: 20 tablet, Rfl: 0  No Known Allergies  Review of Systems  Constitutional: Positive for malaise/fatigue. Negative for fever, chills and weight loss.  HENT: Positive for congestion and sore throat. Negative for hearing loss.   Eyes: Negative for blurred vision and double vision.  Respiratory: Positive for cough. Negative for sputum production, shortness of breath and wheezing.   Cardiovascular: Negative for chest pain, palpitations and PND.  Gastrointestinal: Negative for heartburn, abdominal pain and blood in stool.  Genitourinary: Negative for dysuria, urgency and frequency.  Skin: Negative for rash.  Neurological: Negative for weakness and headaches.  Objective  Filed Vitals:   03/15/16 0812  BP: 137/79  Pulse: 70  Temp: 98.3 F (36.8 C)  TempSrc: Oral  Resp: 16  Height: 5\' 6"  (1.676 m)  Weight: 136 lb 12.8 oz (62.052 kg)     Physical Exam  Constitutional: She is well-developed, well-nourished, and in no distress. No distress.  HENT:  Head: Normocephalic and atraumatic.  Right Ear: External ear normal.  Left Ear: External ear normal.  Nose: Rhinorrhea present. No mucosal edema. Right sinus exhibits no maxillary sinus tenderness and no frontal sinus tenderness. Left sinus exhibits no maxillary sinus tenderness and no frontal sinus tenderness.  Mouth/Throat: Oropharynx is clear  and moist.  Cardiovascular: Normal rate, regular rhythm and normal heart sounds.   Pulmonary/Chest: Effort normal and breath sounds normal. No respiratory distress. She has no wheezes. She has no rales.  Moderate cough.  Coarse breath sounds throughout.  Vitals reviewed.     Recent Results (from the past 2160 hour(s))  Comprehensive Metabolic Panel (CMET)     Status: None   Collection Time: 02/08/16  8:20 AM  Result Value Ref Range   Glucose 90 65 - 99 mg/dL   BUN 22 8 - 27 mg/dL   Creatinine, Ser 0.80 0.57 - 1.00 mg/dL   GFR calc non Af Amer 73 >59 mL/min/1.73   GFR calc Af Amer 84 >59 mL/min/1.73   BUN/Creatinine Ratio 28 12 - 28   Sodium 142 134 - 144 mmol/L   Potassium 4.6 3.5 - 5.2 mmol/L   Chloride 102 96 - 106 mmol/L   CO2 24 18 - 29 mmol/L   Calcium 9.8 8.7 - 10.3 mg/dL   Total Protein 6.1 6.0 - 8.5 g/dL   Albumin 4.0 3.5 - 4.8 g/dL   Globulin, Total 2.1 1.5 - 4.5 g/dL   Albumin/Globulin Ratio 1.9 1.2 - 2.2   Bilirubin Total 0.7 0.0 - 1.2 mg/dL   Alkaline Phosphatase 57 39 - 117 IU/L   AST 25 0 - 40 IU/L   ALT 19 0 - 32 IU/L  CBC with Differential     Status: None   Collection Time: 02/08/16  8:20 AM  Result Value Ref Range   WBC 3.7 3.4 - 10.8 x10E3/uL   RBC 3.96 3.77 - 5.28 x10E6/uL   Hemoglobin 12.5 11.1 - 15.9 g/dL   Hematocrit 36.4 34.0 - 46.6 %   MCV 92 79 - 97 fL   MCH 31.6 26.6 - 33.0 pg   MCHC 34.3 31.5 - 35.7 g/dL   RDW 13.4 12.3 - 15.4 %   Platelets 188 150 - 379 x10E3/uL   Neutrophils 46 %   Lymphs 35 %   Monocytes 13 %   Eos 4 %   Basos 2 %   Neutrophils Absolute 1.7 1.4 - 7.0 x10E3/uL   Lymphocytes Absolute 1.3 0.7 - 3.1 x10E3/uL   Monocytes Absolute 0.5 0.1 - 0.9 x10E3/uL   EOS (ABSOLUTE) 0.2 0.0 - 0.4 x10E3/uL   Basophils Absolute 0.1 0.0 - 0.2 x10E3/uL   Immature Granulocytes 0 %   Immature Grans (Abs) 0.0 0.0 - 0.1 x10E3/uL  Lipid Profile     Status: None   Collection Time: 02/08/16  8:20 AM  Result Value Ref Range   Cholesterol,  Total 165 100 - 199 mg/dL   Triglycerides 56 0 - 149 mg/dL   HDL 79 >39 mg/dL   VLDL Cholesterol Cal 11 5 - 40 mg/dL   LDL Calculated 75 0 - 99 mg/dL  Chol/HDL Ratio 2.1 0.0 - 4.4 ratio units    Comment:                                   T. Chol/HDL Ratio                                             Men  Women                               1/2 Avg.Risk  3.4    3.3                                   Avg.Risk  5.0    4.4                                2X Avg.Risk  9.6    7.1                                3X Avg.Risk 23.4   11.0      Assessment & Plan  1. Upper respiratory infection  - loratadine (CLARITIN) 10 MG tablet; Take 1 tablet (10 mg total) by mouth daily as needed for rhinitis.  Dispense: 20 tablet; Refill: 0  2. Cough  - azithromycin (ZITHROMAX) 250 MG tablet; Take 2 tabs on day 1, then 1 tab daily on days 2-5  Dispense: 6 tablet; Refill: 0 - dextromethorphan-guaiFENesin (MUCINEX DM) 30-600 MG 12hr tablet; Take 1 tablet by mouth 2 (two) times daily as needed for cough.  Dispense: 20 tablet; Refill: 0

## 2016-03-20 DIAGNOSIS — H2513 Age-related nuclear cataract, bilateral: Secondary | ICD-10-CM | POA: Diagnosis not present

## 2016-03-20 DIAGNOSIS — I10 Essential (primary) hypertension: Secondary | ICD-10-CM | POA: Diagnosis not present

## 2016-03-20 DIAGNOSIS — H5203 Hypermetropia, bilateral: Secondary | ICD-10-CM | POA: Diagnosis not present

## 2016-03-23 ENCOUNTER — Encounter: Payer: Self-pay | Admitting: General Surgery

## 2016-03-23 ENCOUNTER — Ambulatory Visit (INDEPENDENT_AMBULATORY_CARE_PROVIDER_SITE_OTHER): Payer: Medicare Other | Admitting: General Surgery

## 2016-03-23 VITALS — BP 130/78 | HR 78 | Resp 14 | Ht 63.0 in | Wt 136.0 lb

## 2016-03-23 DIAGNOSIS — C50312 Malignant neoplasm of lower-inner quadrant of left female breast: Secondary | ICD-10-CM

## 2016-03-23 NOTE — Patient Instructions (Signed)
Patient to follow up in six months with diagnostic mammogram.  Call office with any concerns.

## 2016-03-23 NOTE — Progress Notes (Signed)
Patient ID: Rebecca Mitchell, female   DOB: 1941/02/12, 75 y.o.   MRN: [id number]  Chief Complaint  Patient presents with  . Follow-up    mammogram    HPI Rebecca Mitchell is a 75 y.o. female with a history of left breast cancer and had a lumpectomy 18 months ago who presents for a breast evaluation. The most recent mammogram was done on 02/29/16. Patient does perform regular self breast checks and gets regular mammograms done. She reports no new breast problems and is tolerating the Femara well.   I have reviewed the history of present illness with the patient.   HPI  Past Medical History  Diagnosis Date  . Hyperlipidemia   . Anemia   . Malignant neoplasm of lower-inner quadrant of left female breast ([place]) 07/2014  . Neoplasm of left breast   . Osteoporosis     Past Surgical History  Procedure Laterality Date  . Colonoscopy  2010  . Upper gi endoscopy  2014  . Breast surgery Left 10/01/2014    lumpectomy  . Colonoscopy with propofol N/A 09/21/2015    Procedure: COLONOSCOPY WITH PROPOFOL;  Surgeon: Christene Lye, MD;  Location: [facility] ENDOSCOPY;  Service: Endoscopy;  Laterality: N/A;    Family History  Problem Relation Age of Onset  . Cancer Sister 74    breast  . Heart disease Mother   . Heart disease Father     Social History Social History  Substance Use Topics  . Smoking status: Never Smoker   . Smokeless tobacco: Never Used  . Alcohol Use: No    No Known Allergies  Current Outpatient Prescriptions  Medication Sig Dispense Refill  . amLODipine (NORVASC) 5 MG tablet Take 1 tablet (5 mg total) by mouth daily. 30 tablet 12  . Ascorbic Acid (VITAMIN C) 1000 MG tablet Take 1,000 mg by mouth daily.    . Calcium Citrate-Vitamin D (CALCIUM CITRATE + PO) Take 1 tablet by mouth daily.    Marland Kitchen co-enzyme Q-10 30 MG capsule Take 30 mg by mouth 3 (three) times daily.    . ferrous sulfate 325 (65 FE) MG tablet Take 325 mg by mouth daily with breakfast.    . letrozole (FEMARA) 2.5  MG tablet Take 1 tablet (2.5 mg total) by mouth daily. 30 tablet 12  . losartan-hydrochlorothiazide (HYZAAR) 100-25 MG tablet Take 1 tablet by mouth daily. 10 tablet 0  . Lutein-Zeaxanthin 15-0.7 MG CAPS Take 1 capsule by mouth daily.    . metoprolol tartrate (LOPRESSOR) 25 MG tablet Take 1 tablet (25 mg total) by mouth 2 (two) times daily. 60 tablet 12  . Multiple Vitamin (MULTIVITAMIN) tablet Take 1 tablet by mouth daily.    . Omega-3 Fatty Acids (FISH OIL PO) Take 1 capsule by mouth daily.    . pravastatin (PRAVACHOL) 20 MG tablet Take 1 tablet (20 mg total) by mouth daily. 90 tablet 3  . Saccharomyces boulardii (PROBIOTIC) 250 MG CAPS Take 250 mg by mouth daily.     No current facility-administered medications for this visit.    Review of Systems Review of Systems  Constitutional: Negative.   Respiratory: Negative.   Cardiovascular: Negative.     Blood pressure 130/78, pulse 78, resp. rate 14, height 5\' 3"  (1.6 m), weight 136 lb (61.689 kg).  Physical Exam Physical Exam  Constitutional: She is oriented to person, place, and time. She appears well-developed and well-nourished.  Eyes: Conjunctivae are normal. No scleral icterus.  Neck: Neck supple.  Cardiovascular:  Normal rate, regular rhythm and normal heart sounds.   Pulmonary/Chest: Effort normal and breath sounds normal. Right breast exhibits no inverted nipple, no mass, no nipple discharge, no skin change and no tenderness. Left breast exhibits no inverted nipple, no mass, no nipple discharge and no tenderness.  Left breast lumpectomy scar. Minimal post-radiation skin changes to left breast.   Lymphadenopathy:    She has no cervical adenopathy.  Neurological: She is alert and oriented to person, place, and time.  Skin: Skin is warm and dry.  Psychiatric: She has a normal mood and affect.    Data Reviewed Mammogram reviewed and stable  Assessment    Left breast invasive CA, post lumpectomy/radiation. Now on Letrazole  and doing well. cancer   Pt had a mildly elevated CEA 6 mos ago. Colonoscopy showed what looked like a small polyp in ileocecal area. Biopsy showed no pathology  Plan    Patient to continue monthly self breast exams.  Follow up in six months with diagnostic mammogram.     PCP: Larene Beach This has been scribed by Lesly Rubenstein LPN    [facility] 03/28/2016, 8:02 AM

## 2016-03-24 LAB — CEA: CEA: 8.3 ng/mL — AB (ref 0.0–4.7)

## 2016-03-24 LAB — CANCER ANTIGEN 27.29: CA 27.29: 30.1 U/mL (ref 0.0–38.6)

## 2016-03-27 ENCOUNTER — Telehealth: Payer: Self-pay

## 2016-03-27 NOTE — Telephone Encounter (Signed)
-----   Message from Christene Lye, MD sent at 03/25/2016  9:28 AM EDT ----- Inform pt values are stable. Repeat labs in 6 mos prior to her visit

## 2016-03-28 ENCOUNTER — Encounter: Payer: Self-pay | Admitting: General Surgery

## 2016-03-28 NOTE — Telephone Encounter (Signed)
Notified patient as instructed, patient pleased. Discussed follow-up appointments, patient agrees  

## 2016-04-17 DIAGNOSIS — Z1283 Encounter for screening for malignant neoplasm of skin: Secondary | ICD-10-CM | POA: Diagnosis not present

## 2016-04-17 DIAGNOSIS — L821 Other seborrheic keratosis: Secondary | ICD-10-CM | POA: Diagnosis not present

## 2016-04-25 ENCOUNTER — Encounter: Payer: Self-pay | Admitting: Family Medicine

## 2016-04-25 ENCOUNTER — Ambulatory Visit (INDEPENDENT_AMBULATORY_CARE_PROVIDER_SITE_OTHER): Payer: Medicare Other | Admitting: Family Medicine

## 2016-04-25 VITALS — BP 119/79 | HR 69 | Temp 98.0°F | Resp 16 | Ht 66.0 in | Wt 137.0 lb

## 2016-04-25 DIAGNOSIS — N898 Other specified noninflammatory disorders of vagina: Secondary | ICD-10-CM

## 2016-04-25 NOTE — Patient Instructions (Signed)
Use Neosporin ointment to vaginal fold on L.  Cool compresses as needed.  Return if any worsening or changes

## 2016-04-25 NOTE — Progress Notes (Signed)
Name: Rebecca Mitchell   MRN: [id number]    DOB: [birth year]   Date:04/25/2016       Progress Note  Subjective  Chief Complaint  Chief Complaint  Patient presents with  . vaginal irritation    HPI Here c/o vaginal swelling for past 2 days.  Uncomfortable, but not really painful.  No burning or itching.  Minimal vaginal bleeding noted x 1 with wipng.  No sexual activity.   No problem-specific Assessment & Plan notes found for this encounter.   Past Medical History:  Diagnosis Date  . Anemia   . Hyperlipidemia   . Malignant neoplasm of lower-inner quadrant of left female breast ([place]) 07/2014  . Neoplasm of left breast   . Osteoporosis     Social History  Substance Use Topics  . Smoking status: Never Smoker  . Smokeless tobacco: Never Used  . Alcohol use No     Current Outpatient Prescriptions:  .  amLODipine (NORVASC) 5 MG tablet, Take 1 tablet (5 mg total) by mouth daily., Disp: 30 tablet, Rfl: 12 .  Ascorbic Acid (VITAMIN C) 1000 MG tablet, Take 1,000 mg by mouth daily., Disp: , Rfl:  .  Calcium Citrate-Vitamin D (CALCIUM CITRATE + PO), Take 1 tablet by mouth daily., Disp: , Rfl:  .  co-enzyme Q-10 30 MG capsule, Take 30 mg by mouth 3 (three) times daily., Disp: , Rfl:  .  ferrous sulfate 325 (65 FE) MG tablet, Take 325 mg by mouth daily with breakfast., Disp: , Rfl:  .  letrozole (FEMARA) 2.5 MG tablet, Take 1 tablet (2.5 mg total) by mouth daily., Disp: 30 tablet, Rfl: 12 .  losartan-hydrochlorothiazide (HYZAAR) 100-25 MG tablet, Take 1 tablet by mouth daily., Disp: 10 tablet, Rfl: 0 .  Lutein-Zeaxanthin 15-0.7 MG CAPS, Take 1 capsule by mouth daily., Disp: , Rfl:  .  metoprolol tartrate (LOPRESSOR) 25 MG tablet, Take 1 tablet (25 mg total) by mouth 2 (two) times daily., Disp: 60 tablet, Rfl: 12 .  Multiple Vitamin (MULTIVITAMIN) tablet, Take 1 tablet by mouth daily., Disp: , Rfl:  .  Omega-3 Fatty Acids (FISH OIL PO), Take 1 capsule by mouth daily., Disp: , Rfl:  .   pravastatin (PRAVACHOL) 20 MG tablet, Take 1 tablet (20 mg total) by mouth daily., Disp: 90 tablet, Rfl: 3 .  Saccharomyces boulardii (PROBIOTIC) 250 MG CAPS, Take 250 mg by mouth daily., Disp: , Rfl:   Not on File  Review of Systems  Constitutional: Negative for chills, fever and malaise/fatigue.  HENT: Negative for hearing loss.   Eyes: Negative for blurred vision and photophobia.  Respiratory: Negative for cough, shortness of breath and wheezing.   Cardiovascular: Negative for chest pain, orthopnea and leg swelling.  Gastrointestinal: Negative for abdominal pain, blood in stool and heartburn.  Genitourinary: Negative for dysuria, frequency and urgency.  Musculoskeletal: Negative for myalgias and neck pain.  Skin: Negative for rash.  Neurological: Negative for dizziness, tremors, weakness and headaches.      Objective  Vitals:   04/25/16 0812  BP: 119/79  Pulse: 69  Resp: 16  Temp: 98 F (36.7 C)  TempSrc: Oral  Weight: 137 lb (62.1 kg)  Height: 5\' 6"  (1.676 m)     Physical Exam  Constitutional: She is well-developed, well-nourished, and in no distress. No distress.  HENT:  Head: Normocephalic and atraumatic.  Genitourinary: Uterus normal, cervix normal, right adnexa normal and left adnexa normal. No vaginal discharge found.  Genitourinary Comments: L labia majorum may  be slightly mopre prominent than R.  No lesions.  No tnderness.  Vaginal introitus atrophic but normal in appearance.  Minimal tear in fold of labia minorum on L.  No hernias noted.  Vitals reviewed.     Recent Results (from the past 2160 hour(s))  Comprehensive Metabolic Panel (CMET)     Status: None   Collection Time: 02/08/16  8:20 AM  Result Value Ref Range   Glucose 90 65 - 99 mg/dL   BUN 22 8 - 27 mg/dL   Creatinine, Ser 0.80 0.57 - 1.00 mg/dL   GFR calc non Af Amer 73 >59 mL/min/1.73   GFR calc Af Amer 84 >59 mL/min/1.73   BUN/Creatinine Ratio 28 12 - 28   Sodium 142 134 - 144 mmol/L    Potassium 4.6 3.5 - 5.2 mmol/L   Chloride 102 96 - 106 mmol/L   CO2 24 18 - 29 mmol/L   Calcium 9.8 8.7 - 10.3 mg/dL   Total Protein 6.1 6.0 - 8.5 g/dL   Albumin 4.0 3.5 - 4.8 g/dL   Globulin, Total 2.1 1.5 - 4.5 g/dL   Albumin/Globulin Ratio 1.9 1.2 - 2.2   Bilirubin Total 0.7 0.0 - 1.2 mg/dL   Alkaline Phosphatase 57 39 - 117 IU/L   AST 25 0 - 40 IU/L   ALT 19 0 - 32 IU/L  CBC with Differential     Status: None   Collection Time: 02/08/16  8:20 AM  Result Value Ref Range   WBC 3.7 3.4 - 10.8 x10E3/uL   RBC 3.96 3.77 - 5.28 x10E6/uL   Hemoglobin 12.5 11.1 - 15.9 g/dL   Hematocrit 36.4 34.0 - 46.6 %   MCV 92 79 - 97 fL   MCH 31.6 26.6 - 33.0 pg   MCHC 34.3 31.5 - 35.7 g/dL   RDW 13.4 12.3 - 15.4 %   Platelets 188 150 - 379 x10E3/uL   Neutrophils 46 %   Lymphs 35 %   Monocytes 13 %   Eos 4 %   Basos 2 %   Neutrophils Absolute 1.7 1.4 - 7.0 x10E3/uL   Lymphocytes Absolute 1.3 0.7 - 3.1 x10E3/uL   Monocytes Absolute 0.5 0.1 - 0.9 x10E3/uL   EOS (ABSOLUTE) 0.2 0.0 - 0.4 x10E3/uL   Basophils Absolute 0.1 0.0 - 0.2 x10E3/uL   Immature Granulocytes 0 %   Immature Grans (Abs) 0.0 0.0 - 0.1 x10E3/uL  Lipid Profile     Status: None   Collection Time: 02/08/16  8:20 AM  Result Value Ref Range   Cholesterol, Total 165 100 - 199 mg/dL   Triglycerides 56 0 - 149 mg/dL   HDL 79 >39 mg/dL   VLDL Cholesterol Cal 11 5 - 40 mg/dL   LDL Calculated 75 0 - 99 mg/dL   Chol/HDL Ratio 2.1 0.0 - 4.4 ratio units    Comment:                                   T. Chol/HDL Ratio                                             Men  Women  1/2 Avg.Risk  3.4    3.3                                   Avg.Risk  5.0    4.4                                2X Avg.Risk  9.6    7.1                                3X Avg.Risk 23.4   11.0   Cancer antigen 27.29     Status: None   Collection Time: 03/23/16  2:07 PM  Result Value Ref Range   CA 27.29 30.1 0.0 - 38.6 U/mL     Comment: [employer] [facility]/ACS methodology  CEA     Status: Abnormal   Collection Time: 03/23/16  2:07 PM  Result Value Ref Range   CEA 8.3 (H) 0.0 - 4.7 ng/mL    Comment:        Roche ECLIA methodology       Nonsmokers  <3.9                                      Smokers     <5.6      Assessment & Plan 1. Vaginal irritation Cool compresses.  Neosporin ointment to vaginal fold on L.  Reassurred.

## 2016-06-08 ENCOUNTER — Encounter: Payer: Self-pay | Admitting: *Deleted

## 2016-06-26 ENCOUNTER — Ambulatory Visit: Payer: Medicare Other | Admitting: Radiation Oncology

## 2016-06-29 ENCOUNTER — Other Ambulatory Visit: Payer: Self-pay | Admitting: Family Medicine

## 2016-06-29 DIAGNOSIS — I10 Essential (primary) hypertension: Secondary | ICD-10-CM

## 2016-07-13 ENCOUNTER — Ambulatory Visit: Payer: Medicare Other | Admitting: Radiation Oncology

## 2016-07-27 ENCOUNTER — Ambulatory Visit
Admission: RE | Admit: 2016-07-27 | Discharge: 2016-07-27 | Disposition: A | Discharge status: Home / Self Care | Payer: Medicare Other | Source: Ambulatory Visit | Attending: Radiation Oncology | Admitting: Radiation Oncology

## 2016-07-27 ENCOUNTER — Encounter: Payer: Self-pay | Admitting: Radiation Oncology

## 2016-07-27 VITALS — BP 133/72 | HR 66 | Temp 98.4°F | Wt 137.8 lb

## 2016-07-27 DIAGNOSIS — C50312 Malignant neoplasm of lower-inner quadrant of left female breast: Secondary | ICD-10-CM | POA: Diagnosis not present

## 2016-07-27 DIAGNOSIS — Z79811 Long term (current) use of aromatase inhibitors: Secondary | ICD-10-CM | POA: Diagnosis not present

## 2016-07-27 DIAGNOSIS — Z170 Estrogen receptor positive status [ER+]: Secondary | ICD-10-CM | POA: Insufficient documentation

## 2016-07-27 NOTE — Progress Notes (Signed)
Radiation Oncology Follow up Note  Name: Rebecca Mitchell   Date:   07/27/2016 MRN:  [id number] DOB: [birth year]    This 75 y.o. female presents to the clinic today for 18 month follow-up status post whole breast radiation to the left breast for stage I ER/PR positive invasive mammary carcinoma.  REFERRING PROVIDER: Arlis Porta., MD  HPI: Patient is a 75 year old female now out a year and a half having completed whole breast radiation to her left breast for a 0.5 cm invasive mammary carcinoma status post wide local excision and sentinel node biopsy. Tumor was ER/PR positive HER-2/neu not overexpressed. She is seen today. In routine follow-up and is doing well. She specifically denies breast tenderness cough or bone pain. She is currently on letrozole tolerating that well without side effect. She is had follow-up mammograms which have been fine last one back in June 1448  COMPLICATIONS OF TREATMENT: none  FOLLOW UP COMPLIANCE: keeps appointments   PHYSICAL EXAM:  BP 133/72   Pulse 66   Temp 98.4 F (36.9 C)   Wt 137 lb 12.6 oz (62.5 kg)   BMI 22.24 kg/m  Lungs are clear to A&P cardiac examination essentially unremarkable with regular rate and rhythm. No dominant mass or nodularity is noted in either breast in 2 positions examined. Incision is well-healed. No axillary or supraclavicular adenopathy is appreciated. Cosmetic result is excellent. Well-developed well-nourished patient in NAD. HEENT reveals PERLA, EOMI, discs not visualized.  Oral cavity is clear. No oral mucosal lesions are identified. Neck is clear without evidence of cervical or supraclavicular adenopathy. Lungs are clear to A&P. Cardiac examination is essentially unremarkable with regular rate and rhythm without murmur rub or thrill. Abdomen is benign with no organomegaly or masses noted. Motor sensory and DTR levels are equal and symmetric in the upper and lower extremities. Cranial nerves II through XII are grossly intact.  Proprioception is intact. No peripheral adenopathy or edema is identified. No motor or sensory levels are noted. Crude visual fields are within normal range.  RADIOLOGY RESULTS: Last mammogram has been requested for my review  PLAN: Present time she is now year and half out doing well with no evidence of disease. She continues on letrozole without side effect. She or he has follow-up mammograms ordered. I have asked to see her back in 1 year for follow-up. She knows to call sooner with any concerns.  I would like to take this opportunity to thank you for allowing me to participate in the care of your patient.Armstead Peaks., MD

## 2016-08-01 ENCOUNTER — Ambulatory Visit (INDEPENDENT_AMBULATORY_CARE_PROVIDER_SITE_OTHER): Payer: Medicare Other | Admitting: Family Medicine

## 2016-08-01 ENCOUNTER — Encounter: Payer: Self-pay | Admitting: Family Medicine

## 2016-08-01 VITALS — BP 130/89 | HR 71 | Temp 98.3°F | Resp 16 | Ht 66.0 in | Wt 135.0 lb

## 2016-08-01 DIAGNOSIS — J209 Acute bronchitis, unspecified: Secondary | ICD-10-CM | POA: Diagnosis not present

## 2016-08-01 DIAGNOSIS — J302 Other seasonal allergic rhinitis: Secondary | ICD-10-CM

## 2016-08-01 DIAGNOSIS — J309 Allergic rhinitis, unspecified: Secondary | ICD-10-CM | POA: Insufficient documentation

## 2016-08-01 MED ORDER — AZITHROMYCIN 250 MG PO TABS: ORAL_TABLET | ORAL | 0 refills | Status: DC

## 2016-08-01 MED ORDER — FLUTICASONE PROPIONATE 50 MCG/ACT NA SUSP: 2.0000 | Freq: Every day | NASAL | 3 refills | Status: DC

## 2016-08-01 MED ORDER — LORATADINE 10 MG PO TABS: 10.0000 mg | ORAL_TABLET | Freq: Every day | ORAL | 3 refills | Status: DC

## 2016-08-01 NOTE — Progress Notes (Signed)
Subjective:    Patient ID: Rebecca Mitchell, female    DOB: 1941-01-19, 75 y.o.   MRN: [id number]  Rebecca Mitchell is a 75 y.o. female presenting on 08/01/2016 for Cough (nasal congestion no chills or fever or ear pain throat is sore)   HPI  URI / BRONCHITIS / ALLERGIC RHINITIS: Reports symptom onset about 1 week ago initially with sore throat and congestion, tried OTC nasal congestion and Flonase (was her husband's flonase, she just started for few days, did not previously use regularly), never on anti-histamines regularly. She had similar bronchitis to current episode back in 02/2016, she was treated with azithromycin, mucinex, loratadine with improvement. - Admits sinus pressure with ear pressure bilaterally, post nasal drip with some irritated throat (resolved sore throat), only occasional productive cough - Denies any fevers/chills, sinus pain, ear pain, abdominal pain, diarrhea, nausea, vomiting  Social History  Substance Use Topics  . Smoking status: Never Smoker  . Smokeless tobacco: Never Used  . Alcohol use No    Review of Systems Per HPI unless specifically indicated above     Objective:    BP 130/89   Pulse 71   Temp 98.3 F (36.8 C) (Oral)   Resp 16   Ht 5\' 6"  (1.676 m)   Wt 135 lb (61.2 kg)   SpO2 97%   BMI 21.79 kg/m   Wt Readings from Last 3 Encounters:  08/01/16 135 lb (61.2 kg)  07/27/16 137 lb 12.6 oz (62.5 kg)  04/25/16 137 lb (62.1 kg)    Physical Exam  Constitutional: She appears well-developed and well-nourished. No distress.  Well-appearing, comfortable, cooperative  HENT:  Head: Normocephalic and atraumatic.  Frontal / maxillary sinuses non-tender. Nares patent without purulence or edema. Bilateral TMs clear without erythema, effusion or bulging. Oropharynx clear without erythema, exudates, edema or asymmetry.  Eyes: Conjunctivae are normal.  Neck: Normal range of motion. Neck supple. No thyromegaly present.  Cardiovascular: Normal rate, regular  rhythm, normal heart sounds and intact distal pulses.   No murmur heard. Pulmonary/Chest: Effort normal. No respiratory distress. She has wheezes (mild bilateral lower fields with end expiratory wheezing). She has no rales.  Mild reduced air movement bilateral, some improved following cough.  Musculoskeletal: She exhibits no edema.  Lymphadenopathy:    She has no cervical adenopathy.  Neurological: She is alert.  Skin: Skin is warm and dry. She is not diaphoretic.  Nursing note and vitals reviewed.      Assessment & Plan:   Problem List Items Addressed This Visit    Allergic rhinitis due to allergen    Likely contributing to current lingering bronchitis symptoms, following prior URI/allergies. - Trial on Loratadine 10mg  daily and Flonase 2 sprays each nare daily for 1 month, may need longer      Relevant Medications   loratadine (CLARITIN) 10 MG tablet   fluticasone (FLONASE) 50 MCG/ACT nasal spray    Other Visit Diagnoses    Acute bronchitis, unspecified organism    -  Primary  Consistent with worsening bronchitis in setting of likely viral URI. Concern with duration >1 week - Afebrile, no focal signs of infection (not consistent with pneumonia by history or exam), no evidence sinusitis. Mild exp wheezing sounds on exam concern for some bronchospasm (without history of COPD)  Plan: 1. Start Azithromycin Z-pak dosing 500mg  then 250mg  daily x 4 days 2. Start Loratadine 10mg  daily and Flonase 2 sprays each nare daily for 4-6 weeks 3.Recommend trial OTC - Mucinex (3-4  times daily up to 7 days), Tylenol/Ibuprofen PRN, Nasal saline, lozenges, tea with honey/lemon 4. Return criteria reviewed, follow-up as scheduled within 2 weeks for routine follow-up     Relevant Medications   azithromycin (ZITHROMAX Z-PAK) 250 MG tablet      Meds ordered this encounter  Medications  . azithromycin (ZITHROMAX Z-PAK) 250 MG tablet    Sig: Take 2 tabs (500mg  total) on Day 1. Take 1 tab (250mg )  daily for next 4 days.    Dispense:  6 tablet    Refill:  0  . loratadine (CLARITIN) 10 MG tablet    Sig: Take 1 tablet (10 mg total) by mouth daily. For up to 1 month, then as needed for seasonal allergies.    Dispense:  30 tablet    Refill:  3  . fluticasone (FLONASE) 50 MCG/ACT nasal spray    Sig: Place 2 sprays into both nostrils daily. For up to 4-6 weeks, then as needed for allergies    Dispense:  16 g    Refill:  3      Follow up plan: Return in about 2 weeks (around 08/15/2016), or if symptoms worsen or fail to improve, for  bronchitis.  Nobie Putnam, [facility] 08/01/2016, 3:23 PM

## 2016-08-01 NOTE — Assessment & Plan Note (Signed)
Likely contributing to current lingering bronchitis symptoms, following prior URI/allergies. - Trial on Loratadine 10mg  daily and Flonase 2 sprays each nare daily for 1 month, may need longer

## 2016-08-01 NOTE — Patient Instructions (Signed)
Thank you for coming in to clinic today.  1. It sounds like you had an Upper Respiratory Virus that has settled into a Bronchitis, lower respiratory tract infection. I don't have concerns for pneumonia today, and think that this should gradually improve. Once you are feeling better, the cough may take a few weeks to fully resolve. I do hear mild wheezing and coarse breath sounds, this may be due to the virus - Start Azithromycin Z pak (antibiotic) 2 tabs day 1, then 1 tab x 4 days, complete entire course even if improved - Start OTC Mucinex-DM take 1 pill 3-4 times a day everyday for 1 more week then stop - Start Loratadine 10mg  daily and Flonase 2 sprays in each nostril daily for 4-6 weeks, may need to use longer for allergy seasons - Use nasal saline (Simply Saline or Ocean Spray) to flush nasal congestion multiple times a day, may help cough - Drink plenty of fluids to improve congestion  Please schedule a follow-up appointment with Dr. Parks Ranger in 2 weeks for bronchitis follow-up if not improved  If you have any other questions or concerns, please feel free to call the clinic or send a message through [facility]. You may also schedule an earlier appointment if necessary.  Nobie Putnam, DO [place]

## 2016-08-03 ENCOUNTER — Other Ambulatory Visit: Payer: Self-pay

## 2016-08-03 DIAGNOSIS — C50312 Malignant neoplasm of lower-inner quadrant of left female breast: Secondary | ICD-10-CM

## 2016-08-15 ENCOUNTER — Ambulatory Visit (INDEPENDENT_AMBULATORY_CARE_PROVIDER_SITE_OTHER): Payer: Medicare Other | Admitting: Family Medicine

## 2016-08-15 ENCOUNTER — Encounter: Payer: Self-pay | Admitting: *Deleted

## 2016-08-15 ENCOUNTER — Encounter: Payer: Self-pay | Admitting: Family Medicine

## 2016-08-15 VITALS — BP 130/72 | HR 65 | Temp 98.3°F | Resp 16 | Ht 66.0 in | Wt 136.0 lb

## 2016-08-15 DIAGNOSIS — Z23 Encounter for immunization: Secondary | ICD-10-CM | POA: Diagnosis not present

## 2016-08-15 DIAGNOSIS — I10 Essential (primary) hypertension: Secondary | ICD-10-CM | POA: Diagnosis not present

## 2016-08-15 DIAGNOSIS — R059 Cough, unspecified: Secondary | ICD-10-CM

## 2016-08-15 DIAGNOSIS — R05 Cough: Secondary | ICD-10-CM | POA: Diagnosis not present

## 2016-08-15 MED ORDER — PREDNISONE 10 MG PO TABS: ORAL_TABLET | ORAL | 0 refills | Status: DC

## 2016-08-15 NOTE — Progress Notes (Signed)
Name: Rebecca Mitchell   MRN: [id number]    DOB: July 11, 1941   Date:08/15/2016       Progress Note  Subjective  Chief Complaint  Chief Complaint  Patient presents with  . Follow-up    Bronchitis and Hypertention     HPI Here for f/u pof HBP.  Taking all meds.  She was treated for bronchitis 2 weeks ago.  Feels much better, but still with episodes of cough that can be fairly severe.  No problem-specific Assessment & Plan notes found for this encounter.   Past Medical History:  Diagnosis Date  . Anemia   . Hyperlipidemia   . Malignant neoplasm of lower-inner quadrant of left female breast ([place]) 07/2014  . Neoplasm of left breast   . Osteoporosis     Past Surgical History:  Procedure Laterality Date  . BREAST SURGERY Left 10/01/2014   lumpectomy  . COLONOSCOPY  2010  . COLONOSCOPY WITH PROPOFOL N/A 09/21/2015   Procedure: COLONOSCOPY WITH PROPOFOL;  Surgeon: Christene Lye, MD;  Location: [facility] ENDOSCOPY;  Service: Endoscopy;  Laterality: N/A;  . UPPER GI ENDOSCOPY  2014    Family History  Problem Relation Age of Onset  . Cancer Sister 53    breast  . Heart disease Mother   . Heart disease Father     Social History   Social History  . Marital status: Married    Spouse name: N/A  . Number of children: N/A  . Years of education: N/A   Occupational History  . Not on file.   Social History Main Topics  . Smoking status: Never Smoker  . Smokeless tobacco: Never Used  . Alcohol use No  . Drug use: No  . Sexual activity: Not on file   Other Topics Concern  . Not on file   Social History Narrative  . No narrative on file     Current Outpatient Prescriptions:  .  amLODipine (NORVASC) 5 MG tablet, TAKE 1 TABLET BY MOUTH  DAILY, Disp: 90 tablet, Rfl: 3 .  Ascorbic Acid (VITAMIN C) 1000 MG tablet, Take 1,000 mg by mouth daily., Disp: , Rfl:  .  Calcium Citrate-Vitamin D (CALCIUM CITRATE + PO), Take 1 tablet by mouth daily., Disp: , Rfl:  .  co-enzyme Q-10 30  MG capsule, Take 30 mg by mouth 3 (three) times daily., Disp: , Rfl:  .  ferrous sulfate 325 (65 FE) MG tablet, Take 325 mg by mouth daily with breakfast., Disp: , Rfl:  .  fluticasone (FLONASE) 50 MCG/ACT nasal spray, Place 2 sprays into both nostrils daily. For up Mitchell 4-6 weeks, then as needed for allergies, Disp: 16 g, Rfl: 3 .  letrozole (FEMARA) 2.5 MG tablet, Take 1 tablet (2.5 mg total) by mouth daily., Disp: 30 tablet, Rfl: 12 .  loratadine (CLARITIN) 10 MG tablet, Take 1 tablet (10 mg total) by mouth daily. For up Mitchell 1 month, then as needed for seasonal allergies., Disp: 30 tablet, Rfl: 3 .  losartan-hydrochlorothiazide (HYZAAR) 100-25 MG tablet, TAKE 1 TABLET BY MOUTH  DAILY, Disp: 90 tablet, Rfl: 3 .  Lutein-Zeaxanthin 15-0.7 MG CAPS, Take 1 capsule by mouth daily., Disp: , Rfl:  .  metoprolol tartrate (LOPRESSOR) 25 MG tablet, TAKE 1 TABLET BY MOUTH TWO  TIMES DAILY, Disp: 180 tablet, Rfl: 3 .  Multiple Vitamin (MULTIVITAMIN) tablet, Take 1 tablet by mouth daily., Disp: , Rfl:  .  Omega-3 Fatty Acids (FISH OIL PO), Take 1 capsule by mouth daily.,  Disp: , Rfl:  .  pravastatin (PRAVACHOL) 20 MG tablet, Take 1 tablet (20 mg total) by mouth daily., Disp: 90 tablet, Rfl: 3 .  Saccharomyces boulardii (PROBIOTIC) 250 MG CAPS, Take 250 mg by mouth daily., Disp: , Rfl:  .  predniSONE (DELTASONE) 10 MG tablet, Take 3 tabs daily for 3 days, then 2 tabs daily for 3 days, then 1 tab daily for 3 days.  (3, 3, 3, 2, 2, 2, 1, 1, 1.), Disp: 18 tablet, Rfl: 0  Not on File   Review of Systems  Constitutional: Negative for chills, fever, malaise/fatigue and weight loss.  HENT: Negative for hearing loss and tinnitus.   Eyes: Negative for blurred vision and double vision.  Respiratory: Positive for cough, sputum production (occ. white sputum) and wheezing (occ.). Negative for shortness of breath.   Cardiovascular: Negative for chest pain, palpitations and leg swelling.  Gastrointestinal: Negative for  abdominal pain, blood in stool and heartburn.  Genitourinary: Negative for dysuria, flank pain and urgency.  Musculoskeletal: Negative for back pain and myalgias.  Skin: Negative for rash.  Neurological: Negative for dizziness, tingling, tremors, weakness and headaches.      Objective  Vitals:   08/15/16 1316  BP: 130/72  Pulse: 65  Resp: 16  Temp: 98.3 F (36.8 C)  TempSrc: Oral  Weight: 61.7 kg (136 lb)  Height: 5\' 6"  (1.676 m)    Physical Exam  Constitutional: She is well-developed, well-nourished, and in no distress. No distress.  HENT:  Head: Normocephalic and atraumatic.  Right Ear: External ear normal.  Left Ear: External ear normal.  Nose: Rhinorrhea present. No mucosal edema.  Mouth/Throat: Oropharynx is clear and moist.  Eyes: Conjunctivae and EOM are normal. Pupils are equal, round, and reactive Mitchell light.  Cardiovascular: Normal rate, regular rhythm and normal heart sounds.  Exam reveals no gallop and no friction rub.   No murmur heard. Pulmonary/Chest: Effort normal and breath sounds normal. No respiratory distress. She has no wheezes. She has no rales.  Some paroxysms of cough  Musculoskeletal: She exhibits no edema.  Vitals reviewed.      No results found for this or any previous visit (from the past 2160 hour(s)).   Assessment & Plan  Problem List Items Addressed This Visit      Cardiovascular and Mediastinum   BP (high blood pressure) - Primary    Other Visit Diagnoses    Cough       Relevant Medications   predniSONE (DELTASONE) 10 MG tablet      Meds ordered this encounter  Medications  . predniSONE (DELTASONE) 10 MG tablet    Sig: Take 3 tabs daily for 3 days, then 2 tabs daily for 3 days, then 1 tab daily for 3 days.  (3, 3, 3, 2, 2, 2, 1, 1, 1.)    Dispense:  18 tablet    Refill:  0   1. Essential hypertension Cont Amlodipine, Losartan/HXTZ and Metoprolol  2. Cough  - predniSONE (DELTASONE) 10 MG tablet; Take 3 tabs daily for  3 days, then 2 tabs daily for 3 days, then 1 tab daily for 3 days.  (3, 3, 3, 2, 2, 2, 1, 1, 1.)  Dispense: 18 tablet; Refill: 0

## 2016-08-23 ENCOUNTER — Other Ambulatory Visit: Payer: Self-pay | Admitting: *Deleted

## 2016-08-23 ENCOUNTER — Inpatient Hospital Stay
Admission: RE | Admit: 2016-08-23 | Discharge: 2016-08-23 | Disposition: A | Discharge status: Home / Self Care | Payer: Self-pay | Source: Ambulatory Visit | Attending: *Deleted | Admitting: *Deleted

## 2016-08-23 DIAGNOSIS — Z9289 Personal history of other medical treatment: Secondary | ICD-10-CM

## 2016-08-30 ENCOUNTER — Other Ambulatory Visit: Payer: Self-pay

## 2016-08-30 DIAGNOSIS — C50312 Malignant neoplasm of lower-inner quadrant of left female breast: Secondary | ICD-10-CM

## 2016-09-15 ENCOUNTER — Other Ambulatory Visit: Payer: Self-pay | Admitting: General Surgery

## 2016-10-17 ENCOUNTER — Ambulatory Visit
Admission: RE | Admit: 2016-10-17 | Discharge: 2016-10-17 | Disposition: A | Discharge status: Home / Self Care | Payer: Medicare Other | Source: Ambulatory Visit | Attending: General Surgery | Admitting: General Surgery

## 2016-10-17 DIAGNOSIS — R922 Inconclusive mammogram: Secondary | ICD-10-CM | POA: Diagnosis not present

## 2016-10-17 DIAGNOSIS — C50312 Malignant neoplasm of lower-inner quadrant of left female breast: Secondary | ICD-10-CM | POA: Diagnosis not present

## 2016-10-17 HISTORY — DX: Malignant neoplasm of unspecified site of unspecified female breast: C50.919

## 2016-10-18 LAB — CANCER ANTIGEN 27.29: CA 27.29: 29.3 U/mL (ref 0.0–38.6)

## 2016-10-23 ENCOUNTER — Encounter: Payer: Self-pay | Admitting: General Surgery

## 2016-10-23 ENCOUNTER — Ambulatory Visit (INDEPENDENT_AMBULATORY_CARE_PROVIDER_SITE_OTHER): Payer: Medicare Other | Admitting: General Surgery

## 2016-10-23 VITALS — BP 124/68 | HR 68 | Resp 13 | Ht 66.0 in | Wt 136.0 lb

## 2016-10-23 DIAGNOSIS — Z170 Estrogen receptor positive status [ER+]: Secondary | ICD-10-CM

## 2016-10-23 DIAGNOSIS — C50312 Malignant neoplasm of lower-inner quadrant of left female breast: Secondary | ICD-10-CM

## 2016-10-23 NOTE — Progress Notes (Signed)
Patient ID: Rebecca Mitchell, female   DOB: Mar 30, 1941, 76 y.o.   MRN: [id number]  Chief Complaint  Patient presents with  . Follow-up    breast cancer    HPI Rebecca Mitchell is a 76 y.o. female who presents for a breast cancer follow up. The most recent mammogram was done on 10/17/16. Patient does perform regular self breast checks and gets regular mammograms done.   I have reviewed the history of present illness with the patient.   HPI  Past Medical History:  Diagnosis Date  . Anemia   . Breast cancer ([place]) 09/2014   left breast lumpectomy with rad tx  . Hyperlipidemia   . Malignant neoplasm of lower-inner quadrant of left female breast ([facility]) 07/2014   left breast  . Neoplasm of left breast   . Osteoporosis     Past Surgical History:  Procedure Laterality Date  . BREAST BIOPSY Left 08/2014   invasive mammary carcinoma  . BREAST SURGERY Left 10/01/2014   lumpectomy  . COLONOSCOPY  2010  . COLONOSCOPY WITH PROPOFOL N/A 09/21/2015   Procedure: COLONOSCOPY WITH PROPOFOL;  Surgeon: Christene Lye, MD;  Location: [facility] ENDOSCOPY;  Service: Endoscopy;  Laterality: N/A;  . UPPER GI ENDOSCOPY  2014    Family History  Problem Relation Age of Onset  . Breast cancer Sister 34  . Heart disease Mother   . Heart disease Father     Social History Social History  Substance Use Topics  . Smoking status: Never Smoker  . Smokeless tobacco: Never Used  . Alcohol use No    No Known Allergies  Current Outpatient Prescriptions  Medication Sig Dispense Refill  . amLODipine (NORVASC) 5 MG tablet TAKE 1 TABLET BY MOUTH  DAILY 90 tablet 3  . Ascorbic Acid (VITAMIN C) 1000 MG tablet Take 1,000 mg by mouth daily.    . Calcium Citrate-Vitamin D (CALCIUM CITRATE + PO) Take 1 tablet by mouth daily.    Marland Kitchen co-enzyme Q-10 30 MG capsule Take 30 mg by mouth 3 (three) times daily.    Marland Kitchen FERROUS SULFATE PO Take 1 tablet by mouth 3 (three) times a week.    . fluticasone (FLONASE) 50 MCG/ACT nasal spray  Place 2 sprays into both nostrils daily. For up to 4-6 weeks, then as needed for allergies 16 g 3  . letrozole (FEMARA) 2.5 MG tablet TAKE 1 TABLET BY MOUTH  DAILY 90 tablet 4  . loratadine (CLARITIN) 10 MG tablet Take 1 tablet (10 mg total) by mouth daily. For up to 1 month, then as needed for seasonal allergies. 30 tablet 3  . losartan-hydrochlorothiazide (HYZAAR) 100-25 MG tablet TAKE 1 TABLET BY MOUTH  DAILY 90 tablet 3  . Lutein-Zeaxanthin 15-0.7 MG CAPS Take 1 capsule by mouth daily.    . metoprolol tartrate (LOPRESSOR) 25 MG tablet TAKE 1 TABLET BY MOUTH TWO  TIMES DAILY 180 tablet 3  . Multiple Vitamin (MULTIVITAMIN) tablet Take 1 tablet by mouth daily.    . Omega-3 Fatty Acids (FISH OIL PO) Take 1 capsule by mouth daily.    . pravastatin (PRAVACHOL) 20 MG tablet Take 1 tablet (20 mg total) by mouth daily. 90 tablet 3  . Saccharomyces boulardii (PROBIOTIC) 250 MG CAPS Take 250 mg by mouth daily.     No current facility-administered medications for this visit.     Review of Systems Review of Systems  Constitutional: Negative.   Respiratory: Negative.   Cardiovascular: Negative.     Blood  pressure 124/68, pulse 68, resp. rate 13, height 5\' 6"  (1.676 m), weight 136 lb (61.7 kg).  Physical Exam Physical Exam  Constitutional: She is oriented to person, place, and time. She appears well-developed and well-nourished.  Eyes: Conjunctivae are normal. No scleral icterus.  Neck: Neck supple.  Cardiovascular: Normal rate, regular rhythm and normal heart sounds.   Pulmonary/Chest: Effort normal and breath sounds normal. Right breast exhibits no inverted nipple, no mass, no nipple discharge, no skin change and no tenderness. Left breast exhibits skin change. Left breast exhibits no inverted nipple, no mass, no nipple discharge and no tenderness.    Abdominal: Soft.  Lymphadenopathy:    She has no cervical adenopathy.    She has no axillary adenopathy.  Neurological: She is alert and  oriented to person, place, and time.  Skin: Skin is warm and dry.  Psychiatric: She has a normal mood and affect.    Data Reviewed Mammogram reviewed and prior notes. Ca 27-29 is normal and stable.  Assessment    Post-operative lumpectomy and radiation, left breast CA LIQ Patient is on Letrazole and doing well.     Plan    Follow-up in 6 months with CA 27-29      This has been scribed by Lesly Rubenstein LPN   Stepheni Cameron G 10/23/2016, 4:07 PM

## 2016-10-23 NOTE — Patient Instructions (Addendum)
Follow-up in 6 months with CA 27-29

## 2016-11-20 ENCOUNTER — Other Ambulatory Visit: Payer: Self-pay | Admitting: Family Medicine

## 2016-11-20 DIAGNOSIS — E785 Hyperlipidemia, unspecified: Secondary | ICD-10-CM

## 2016-12-11 ENCOUNTER — Encounter: Payer: Self-pay | Admitting: Family Medicine

## 2016-12-11 ENCOUNTER — Ambulatory Visit (INDEPENDENT_AMBULATORY_CARE_PROVIDER_SITE_OTHER): Payer: Medicare Other | Admitting: Family Medicine

## 2016-12-11 VITALS — BP 130/65 | HR 69 | Temp 97.5°F | Resp 16 | Ht 66.0 in | Wt 134.0 lb

## 2016-12-11 DIAGNOSIS — C50312 Malignant neoplasm of lower-inner quadrant of left female breast: Secondary | ICD-10-CM

## 2016-12-11 DIAGNOSIS — D509 Iron deficiency anemia, unspecified: Secondary | ICD-10-CM

## 2016-12-11 DIAGNOSIS — M41124 Adolescent idiopathic scoliosis, thoracic region: Secondary | ICD-10-CM

## 2016-12-11 DIAGNOSIS — D709 Neutropenia, unspecified: Secondary | ICD-10-CM

## 2016-12-11 DIAGNOSIS — Z170 Estrogen receptor positive status [ER+]: Secondary | ICD-10-CM | POA: Diagnosis not present

## 2016-12-11 DIAGNOSIS — I10 Essential (primary) hypertension: Secondary | ICD-10-CM

## 2016-12-11 DIAGNOSIS — J302 Other seasonal allergic rhinitis: Secondary | ICD-10-CM | POA: Diagnosis not present

## 2016-12-11 DIAGNOSIS — E785 Hyperlipidemia, unspecified: Secondary | ICD-10-CM

## 2016-12-11 NOTE — Progress Notes (Signed)
Name: Rebecca Mitchell   MRN: [id number]    DOB: [birth year]   Date:12/11/2016       Progress Note  Subjective  Chief Complaint  Chief Complaint  Patient presents with  . Hypertension    HPI Here for f/u of HBP.  She is taking all meds and feeling well overall.  Has hx of breast cancer that is  doing well.  Has elevated ipids and some seasonal allergies. No problem-specific Assessment & Plan notes found for this encounter.   Past Medical History:  Diagnosis Date  . Anemia   . Breast cancer ([place]) 09/2014   left breast lumpectomy with rad tx  . Hyperlipidemia   . Malignant neoplasm of lower-inner quadrant of left female breast ([place]) 07/2014   left breast  . Neoplasm of left breast   . Osteoporosis     Past Surgical History:  Procedure Laterality Date  . BREAST BIOPSY Left 08/2014   invasive mammary carcinoma  . BREAST SURGERY Left 10/01/2014   lumpectomy  . COLONOSCOPY  2010  . COLONOSCOPY WITH PROPOFOL N/A 09/21/2015   Procedure: COLONOSCOPY WITH PROPOFOL;  Surgeon: Christene Lye, MD;  Location: [facility] ENDOSCOPY;  Service: Endoscopy;  Laterality: N/A;  . UPPER GI ENDOSCOPY  2014    Family History  Problem Relation Age of Onset  . Breast cancer Sister 67  . Heart disease Mother   . Heart disease Father     Social History   Social History  . Marital status: Married    Spouse name: N/A  . Number of children: N/A  . Years of education: N/A   Occupational History  . Not on file.   Social History Main Topics  . Smoking status: Never Smoker  . Smokeless tobacco: Never Used  . Alcohol use No  . Drug use: No  . Sexual activity: Not on file   Other Topics Concern  . Not on file   Social History Narrative  . No narrative on file     Current Outpatient Prescriptions:  .  amLODipine (NORVASC) 5 MG tablet, TAKE 1 TABLET BY MOUTH  DAILY, Disp: 90 tablet, Rfl: 3 .  Ascorbic Acid (VITAMIN C) 1000 MG tablet, Take 1,000 mg by mouth daily., Disp: , Rfl:  .  Calcium  Citrate-Vitamin D (CALCIUM CITRATE + PO), Take 1 tablet by mouth daily., Disp: , Rfl:  .  co-enzyme Q-10 30 MG capsule, Take 30 mg by mouth 3 (three) times daily., Disp: , Rfl:  .  FERROUS SULFATE PO, Take 1 tablet by mouth 3 (three) times a week., Disp: , Rfl:  .  fluticasone (FLONASE) 50 MCG/ACT nasal spray, Place 2 sprays into both nostrils daily. For up to 4-6 weeks, then as needed for allergies, Disp: 16 g, Rfl: 3 .  letrozole (FEMARA) 2.5 MG tablet, TAKE 1 TABLET BY MOUTH  DAILY, Disp: 90 tablet, Rfl: 4 .  loratadine (CLARITIN) 10 MG tablet, Take 1 tablet (10 mg total) by mouth daily. For up to 1 month, then as needed for seasonal allergies., Disp: 30 tablet, Rfl: 3 .  losartan-hydrochlorothiazide (HYZAAR) 100-25 MG tablet, TAKE 1 TABLET BY MOUTH  DAILY, Disp: 90 tablet, Rfl: 3 .  Lutein-Zeaxanthin 15-0.7 MG CAPS, Take 1 capsule by mouth daily., Disp: , Rfl:  .  metoprolol tartrate (LOPRESSOR) 25 MG tablet, TAKE 1 TABLET BY MOUTH TWO  TIMES DAILY, Disp: 180 tablet, Rfl: 3 .  Multiple Vitamin (MULTIVITAMIN) tablet, Take 1 tablet by mouth daily., Disp: , Rfl:  .  Omega-3 Fatty Acids (FISH OIL PO), Take 1 capsule by mouth daily., Disp: , Rfl:  .  pravastatin (PRAVACHOL) 20 MG tablet, TAKE 1 TABLET BY MOUTH  DAILY, Disp: 90 tablet, Rfl: 3 .  Saccharomyces boulardii (PROBIOTIC) 250 MG CAPS, Take 250 mg by mouth daily., Disp: , Rfl:   Not on File   Review of Systems  Constitutional: Negative for chills, fever, malaise/fatigue and weight loss.  HENT: Negative for hearing loss and tinnitus.   Eyes: Negative for blurred vision and double vision.  Respiratory: Negative for cough, shortness of breath and wheezing.   Cardiovascular: Negative for chest pain, palpitations and leg swelling.  Gastrointestinal: Negative for abdominal pain, blood in stool, diarrhea and heartburn.  Genitourinary: Negative for dysuria, frequency and urgency.  Musculoskeletal: Negative for joint pain and myalgias.  Skin:  Negative for rash.  Neurological: Negative for dizziness, tingling, tremors, weakness and headaches.      Objective  Vitals:   12/11/16 1352 12/11/16 1427  BP: 118/65 130/65  Pulse: 69   Resp: 16   Temp: 97.5 F (36.4 C)   TempSrc: Oral   Weight: 134 lb (60.8 kg)   Height: 5\' 6"  (1.676 m)     Physical Exam  Constitutional: She is oriented to person, place, and time and well-developed, well-nourished, and in no distress. No distress.  HENT:  Head: Normocephalic and atraumatic.  Eyes: Conjunctivae are normal. Pupils are equal, round, and reactive to light. No scleral icterus.  Neck: Normal range of motion. Neck supple. Carotid bruit is not present. No thyromegaly present.  Cardiovascular: Normal rate, regular rhythm and normal heart sounds.  Exam reveals no gallop and no friction rub.   No murmur heard. Pulmonary/Chest: Effort normal and breath sounds normal. No respiratory distress. She has no wheezes. She has no rales.  Musculoskeletal: She exhibits no edema.  Lymphadenopathy:    She has no cervical adenopathy.  Neurological: She is alert and oriented to person, place, and time.  Vitals reviewed.      Recent Results (from the past 2160 hour(s))  Cancer antigen 27.29     Status: None   Collection Time: 10/17/16  2:46 PM  Result Value Ref Range   CA 27.29 29.3 0.0 - 38.6 U/mL    Comment: Research scientist (life sciences)     Assessment & Plan  Problem List Items Addressed This Visit      Cardiovascular and Mediastinum   HTN (hypertension) - Primary   Relevant Orders   COMPLETE METABOLIC PANEL WITH GFR     Respiratory   Allergic rhinitis due to allergen     Musculoskeletal and Integument   Scoliosis     Other   Malignant neoplasm of breast (female) ([place])   Absolute anemia   HLD (hyperlipidemia)   Relevant Orders   Lipid Profile   Decreased leukocytes   Relevant Orders   CBC with Differential      No orders of the defined types were placed in this  encounter.  1. Essential hypertension Cont Am lodipine and Losartan/HCTZ - COMPLETE METABOLIC PANEL WITH GFR  2. Adolescent idiopathic scoliosis of thoracic region   3. Chronic seasonal allergic rhinitis due to other allergen Cont meds as needed  4. Malignant neoplasm of lower-inner quadrant of left breast in female, estrogen receptor positive ([place]) Cont to see Dr. Jamal Collin.  5. Hyperlipidemia, unspecified hyperlipidemia type Cont Pravachol - Lipid Profile  6. Iron deficiency anemia, unspecified iron deficiency anemia type   7. Neutropenia, unspecified type ([place])  -  CBC with Differential

## 2016-12-12 ENCOUNTER — Ambulatory Visit: Payer: Medicare Other | Admitting: Family Medicine

## 2016-12-20 ENCOUNTER — Other Ambulatory Visit: Payer: Medicare Other

## 2016-12-20 DIAGNOSIS — E785 Hyperlipidemia, unspecified: Secondary | ICD-10-CM | POA: Diagnosis not present

## 2016-12-20 DIAGNOSIS — D709 Neutropenia, unspecified: Secondary | ICD-10-CM | POA: Diagnosis not present

## 2016-12-20 DIAGNOSIS — I10 Essential (primary) hypertension: Secondary | ICD-10-CM | POA: Diagnosis not present

## 2016-12-20 LAB — CBC WITH DIFFERENTIAL/PLATELET
BASOS ABS: 36 {cells}/uL (ref 0–200)
Basophils Relative: 1 %
EOS PCT: 6 %
Eosinophils Absolute: 216 cells/uL (ref 15–500)
HCT: 39.2 % (ref 35.0–45.0)
HEMOGLOBIN: 12.7 g/dL (ref 11.7–15.5)
LYMPHS ABS: 900 {cells}/uL (ref 850–3900)
Lymphocytes Relative: 25 %
MCH: 31.4 pg (ref 27.0–33.0)
MCHC: 32.4 g/dL (ref 32.0–36.0)
MCV: 97 fL (ref 80.0–100.0)
MPV: 11.3 fL (ref 7.5–12.5)
Monocytes Absolute: 504 cells/uL (ref 200–950)
Monocytes Relative: 14 %
NEUTROS PCT: 54 %
Neutro Abs: 1944 cells/uL (ref 1500–7800)
Platelets: 180 10*3/uL (ref 140–400)
RBC: 4.04 MIL/uL (ref 3.80–5.10)
RDW: 13 % (ref 11.0–15.0)
WBC: 3.6 10*3/uL — AB (ref 3.8–10.8)

## 2016-12-21 ENCOUNTER — Encounter: Payer: Self-pay | Admitting: *Deleted

## 2016-12-21 LAB — COMPLETE METABOLIC PANEL WITH GFR
ALBUMIN: 3.7 g/dL (ref 3.6–5.1)
ALK PHOS: 54 U/L (ref 33–130)
ALT: 18 U/L (ref 6–29)
AST: 22 U/L (ref 10–35)
BUN: 16 mg/dL (ref 7–25)
CALCIUM: 9.5 mg/dL (ref 8.6–10.4)
CO2: 28 mmol/L (ref 20–31)
Chloride: 106 mmol/L (ref 98–110)
Creat: 0.73 mg/dL (ref 0.60–0.93)
GFR, Est African American: >89 mL/min (ref >=60)
GFR, Est Non African American: 81 mL/min (ref >=60)
GLUCOSE: 96 mg/dL (ref 65–99)
POTASSIUM: 4.2 mmol/L (ref 3.5–5.3)
SODIUM: 142 mmol/L (ref 135–146)
TOTAL PROTEIN: 6 g/dL — AB (ref 6.1–8.1)
Total Bilirubin: 0.7 mg/dL (ref 0.2–1.2)

## 2016-12-21 LAB — LIPID PANEL
CHOLESTEROL: 160 mg/dL (ref <200)
HDL: 72 mg/dL (ref >50)
LDL CALC: 76 mg/dL (ref <100)
Total CHOL/HDL Ratio: 2.2 Ratio (ref <5.0)
Triglycerides: 59 mg/dL (ref <150)
VLDL: 12 mg/dL (ref <30)

## 2017-03-12 ENCOUNTER — Other Ambulatory Visit: Payer: Self-pay

## 2017-03-12 DIAGNOSIS — Z170 Estrogen receptor positive status [ER+]: Principal | ICD-10-CM

## 2017-03-12 DIAGNOSIS — C50312 Malignant neoplasm of lower-inner quadrant of left female breast: Secondary | ICD-10-CM

## 2017-04-03 ENCOUNTER — Ambulatory Visit: Payer: Medicare Other

## 2017-04-17 ENCOUNTER — Ambulatory Visit: Payer: Medicare Other

## 2017-04-17 ENCOUNTER — Ambulatory Visit: Payer: Medicare Other | Admitting: Family Medicine

## 2017-04-18 LAB — CANCER ANTIGEN 27.29: CAN 27.29: 20.3 U/mL (ref 0.0–38.6)

## 2017-04-20 ENCOUNTER — Ambulatory Visit (INDEPENDENT_AMBULATORY_CARE_PROVIDER_SITE_OTHER): Payer: Medicare Other | Admitting: Family Medicine

## 2017-04-20 ENCOUNTER — Encounter: Payer: Self-pay | Admitting: Family Medicine

## 2017-04-20 VITALS — BP 115/57 | HR 66 | Temp 97.6°F | Resp 16 | Ht 66.0 in | Wt 136.0 lb

## 2017-04-20 DIAGNOSIS — I10 Essential (primary) hypertension: Secondary | ICD-10-CM | POA: Diagnosis not present

## 2017-04-20 DIAGNOSIS — M41124 Adolescent idiopathic scoliosis, thoracic region: Secondary | ICD-10-CM | POA: Diagnosis not present

## 2017-04-20 DIAGNOSIS — M810 Age-related osteoporosis without current pathological fracture: Secondary | ICD-10-CM

## 2017-04-20 NOTE — Patient Instructions (Addendum)
Thank you for coming to the clinic today.  1.  Keep up the great work!  For BP - Try reducing Amlodipine 5mg  - cut tab in half for 2.5mg  dose once daily for 1-2 weeks, check BP occasionally, if normal range 110-130s then can try even HOLDING pill completely to see if you do not need it anymore, and check BP off the pill  2. History of osteoporosis, recommend starting regular Calcium / Vitamin D supplement - continue current Calcium supplement, check to see if has Vitamin D in it  Recommend daily maintenance - Vitamin D3 = 400 iu up to max of 2,000 iu OTC  3. For DEXA Scan (Bone mineral density) screening for osteoporosis  Call the [facility] below anytime to schedule your own appointment now that order has been placed.  [facility] [place], [place] [address] Phone: [phone]  After visit with Dr Rennis Harding for Scoliosis - have them fax [place] a copy of the record  DUE for FASTING BLOOD WORK (no food or drink after midnight before the lab appointment, only water or coffee without cream/sugar on the morning of)  SCHEDULE "Lab Only" visit in the morning at the clinic for lab draw in 8 MONTHS   - Make sure Lab Only appointment is at about 1 week before your next appointment, so that results will be available  For Lab Results, once available within 2-3 days of blood draw, you can can log in to MyChart online to view your results and a brief explanation. Also, we can discuss results at next follow-up visit.   Please schedule a Follow-up Appointment to: Return in about 8 months (around 12/19/2017) for HTN, Osteoporosis/Vit D, Back/Scoliosis.  If you have any other questions or concerns, please feel free to call the clinic or send a message through [place]. You may also schedule an earlier appointment if necessary.  Additionally, you may be receiving a survey about your experience at our clinic within a few days to 1  week by e-mail or mail. We value your feedback.  Nobie Putnam, DO [place]

## 2017-04-20 NOTE — Progress Notes (Signed)
Subjective:    Patient ID: Rebecca Mitchell, female    DOB: 1941-05-12, 76 y.o.   MRN: [id number]  Rebecca Mitchell is a 76 y.o. female presenting on 04/20/2017 for Hypertension   HPI   CHRONIC HTN: Reports no new concerns. Checks BP at home sometimes when thinks of it, usually normal 120/60-70s Current Meds - Amlodipine 5mg  daily, Losartan-HCTZ 100-25mg  daily, Metoprolol 25mg  BID Reports good compliance, took meds today. Tolerating well, w/o complaints. Lifestyle: - Diet: Balanced diet, drinks mostly water, some decaf tea - Exercise: daily with senior exercise class, strength and cardio, 4x weekly, and stretching weekly Denies CP, dyspnea, HA, edema, dizziness / lightheadedness  Chronic Scoliosis / Chronic Back Pain - Reports chronic problem with scoliosis since adolescent, had been managed at that time by specialist but then did well for many years without treatment, only home exercises. Has not had problems since - Now reports some gradual worsening with "sore muscles" and back pain intermittently, seems to be worse if tired and run down, she has already set up new apt to be evaluated by spine/scoliosis specialist on 05/09/17 with Dr Rennis Harding ([facility], [facility]) - Not having constant pain - Not interested in meds, used to do more exercises in past for scoliosis, progression  History of Osteoporosis - Last DEXA in 2004, does not recall exact results, states osteoporosis hip, never on treatment, took some Calcium supplement, temporarily on Vitamin D in past, does not recall level and dose. No longer taking Vitamin D  PMH - Breast Cancer, followed by Dr Jamal Collin  Health Maintenance: - Due for flu shot in fall will return when in stock - Due for repeat DEXA, see above  Social History  Substance Use Topics  . Smoking status: Never Smoker  . Smokeless tobacco: Never Used  . Alcohol use No    Review of Systems Per HPI unless specifically indicated above       Objective:    BP (!) 115/57   Pulse 66   Temp 97.6 F (36.4 C) (Oral)   Resp 16   Ht 5\' 6"  (1.676 m)   Wt 136 lb (61.7 kg)   BMI 21.95 kg/m   Wt Readings from Last 3 Encounters:  04/20/17 136 lb (61.7 kg)  12/11/16 134 lb (60.8 kg)  10/23/16 136 lb (61.7 kg)    Physical Exam  Constitutional: She is oriented to person, place, and time. She appears well-developed and well-nourished. No distress.  Well-appearing, comfortable, cooperative  HENT:  Head: Normocephalic and atraumatic.  Mouth/Throat: Oropharynx is clear and moist.  Eyes: Conjunctivae are normal. Right eye exhibits no discharge. Left eye exhibits no discharge.  Neck: Normal range of motion. Neck supple. No thyromegaly present.  Cardiovascular: Normal rate, regular rhythm, normal heart sounds and intact distal pulses.   No murmur heard. Pulmonary/Chest: Effort normal and breath sounds normal. No respiratory distress. She has no wheezes. She has no rales.  Musculoskeletal: Normal range of motion. She exhibits no edema.  Significant thoracic dextroscoliosis based on curve, reportedly S shaped, non tender, no muscle spasm  Lymphadenopathy:    She has no cervical adenopathy.  Neurological: She is alert and oriented to person, place, and time.  Skin: Skin is warm and dry. No rash noted. She is not diaphoretic. No erythema.  Psychiatric: She has a normal mood and affect. Her behavior is normal.  Well groomed, good eye contact, normal speech and thoughts  Nursing note and vitals reviewed.  Assessment & Plan:   Problem List Items Addressed This Visit    Scoliosis    Chronic thoracic/lumbar scoliosis since adolescent Possible gradual progression/worsening, intermittent pain but not limiting function or breathing Follow-up as planned with Back/Scoliosis specialist now in [place], follow-up PRN symptom management      OP (osteoporosis)    Stable without fractures, history of prior dx osteoporosis on DEXA 2004 Not  on regular vitamin D supplement, no recent lab Ordered repeat DEXA patient to schedule, f/u results and recommend inc vit D supplement, consider prophylaxis if needed      Relevant Orders   DG Bone Density   Essential hypertension - Primary    Well-controlled HTN - Home BP readings normal  No known complications   Plan:  1. Continue current BP regimen Losartan-HCTZ 100-25mg  daily, Metoprolol 25mg  BID, Amlodipine 5mg  daily - advised patient may try to reduce / taper off Amlodipine can cut in half 2.5mg  then try holding in future 2. Encourage improved lifestyle - low sodium diet, regular exercise 3.  Continue monitor BP outside office, bring readings to next visit, if persistently >140/90 or new symptoms notify office sooner 4. Follow-up 8 months as planned         No orders of the defined types were placed in this encounter.   Follow up plan: Return in about 8 months (around 12/19/2017) for HTN, Osteoporosis/Vit D, Back/Scoliosis.  Future labs ordered.  Nobie Putnam, [facility] 04/20/2017, 6:27 PM

## 2017-04-22 ENCOUNTER — Other Ambulatory Visit: Payer: Self-pay | Admitting: Family Medicine

## 2017-04-22 DIAGNOSIS — E782 Mixed hyperlipidemia: Secondary | ICD-10-CM

## 2017-04-22 DIAGNOSIS — D509 Iron deficiency anemia, unspecified: Secondary | ICD-10-CM

## 2017-04-22 DIAGNOSIS — I10 Essential (primary) hypertension: Secondary | ICD-10-CM

## 2017-04-22 DIAGNOSIS — R7309 Other abnormal glucose: Secondary | ICD-10-CM

## 2017-04-22 DIAGNOSIS — M810 Age-related osteoporosis without current pathological fracture: Secondary | ICD-10-CM

## 2017-04-22 NOTE — Assessment & Plan Note (Signed)
Stable without fractures, history of prior dx osteoporosis on DEXA 2004 Not on regular vitamin D supplement, no recent lab Ordered repeat DEXA patient to schedule, f/u results and recommend inc vit D supplement, consider prophylaxis if needed

## 2017-04-22 NOTE — Assessment & Plan Note (Signed)
Chronic thoracic/lumbar scoliosis since adolescent Possible gradual progression/worsening, intermittent pain but not limiting function or breathing Follow-up as planned with Back/Scoliosis specialist now in [place], follow-up PRN symptom management

## 2017-04-22 NOTE — Assessment & Plan Note (Signed)
Well-controlled HTN - Home BP readings normal  No known complications   Plan:  1. Continue current BP regimen Losartan-HCTZ 100-25mg  daily, Metoprolol 25mg  BID, Amlodipine 5mg  daily - advised patient may try to reduce / taper off Amlodipine can cut in half 2.5mg  then try holding in future 2. Encourage improved lifestyle - low sodium diet, regular exercise 3.  Continue monitor BP outside office, bring readings to next visit, if persistently >140/90 or new symptoms notify office sooner 4. Follow-up 8 months as planned

## 2017-04-23 ENCOUNTER — Encounter: Payer: Self-pay | Admitting: General Surgery

## 2017-04-23 ENCOUNTER — Ambulatory Visit: Payer: Medicare Other | Admitting: General Surgery

## 2017-04-23 ENCOUNTER — Ambulatory Visit (INDEPENDENT_AMBULATORY_CARE_PROVIDER_SITE_OTHER): Payer: Medicare Other | Admitting: General Surgery

## 2017-04-23 VITALS — BP 122/74 | HR 68 | Resp 12 | Ht 63.0 in | Wt 136.0 lb

## 2017-04-23 DIAGNOSIS — Z170 Estrogen receptor positive status [ER+]: Secondary | ICD-10-CM | POA: Diagnosis not present

## 2017-04-23 DIAGNOSIS — C50312 Malignant neoplasm of lower-inner quadrant of left female breast: Secondary | ICD-10-CM

## 2017-04-23 NOTE — Patient Instructions (Signed)
Follow up in January with Dr. Bary Castilla with bilateral diagnostic mammogram  Call with any questions or concerns

## 2017-04-23 NOTE — Progress Notes (Signed)
Patient ID: Rebecca Mitchell, female   DOB: December 12, 1940, 76 y.o.   MRN: [id number]  Chief Complaint  Patient presents with  . Follow-up    HPI [name] is a 76 y.o. female is here today for a 6 month breast cancer follow up and CA 27.29. Patient states she is doing well.  HPI  Past Medical History:  Diagnosis Date  . Anemia   . Breast cancer ([place]) 09/2014   left breast lumpectomy with rad tx  . Hyperlipidemia   . Malignant neoplasm of lower-inner quadrant of left female breast ([place]) 07/2014   left breast  . Neoplasm of left breast   . Osteoporosis     Past Surgical History:  Procedure Laterality Date  . BREAST BIOPSY Left 08/2014   invasive mammary carcinoma  . BREAST SURGERY Left 10/01/2014   lumpectomy  . COLONOSCOPY  2010  . COLONOSCOPY WITH PROPOFOL N/A 09/21/2015   Procedure: COLONOSCOPY WITH PROPOFOL;  Surgeon: Christene Lye, MD;  Location: [facility] ENDOSCOPY;  Service: Endoscopy;  Laterality: N/A;  . UPPER GI ENDOSCOPY  2014    Family History  Problem Relation Age of Onset  . Breast cancer Sister 95  . Heart disease Mother   . Heart disease Father     Social History Social History  Substance Use Topics  . Smoking status: Never Smoker  . Smokeless tobacco: Never Used  . Alcohol use No    No Known Allergies  Current Outpatient Prescriptions  Medication Sig Dispense Refill  . amLODipine (NORVASC) 5 MG tablet TAKE 1 TABLET BY MOUTH  DAILY 90 tablet 3  . Ascorbic Acid (VITAMIN C) 1000 MG tablet Take 1,000 mg by mouth daily.    . Calcium Citrate-Vitamin D (CALCIUM CITRATE + PO) Take 1 tablet by mouth daily.    Marland Kitchen co-enzyme Q-10 30 MG capsule Take 30 mg by mouth 3 (three) times daily.    Marland Kitchen FERROUS SULFATE PO Take 1 tablet by mouth 3 (three) times a week.    . fluticasone (FLONASE) 50 MCG/ACT nasal spray Place 2 sprays into both nostrils daily. For up to 4-6 weeks, then as needed for allergies 16 g 3  . letrozole (FEMARA) 2.5 MG tablet TAKE 1 TABLET BY MOUTH   DAILY 90 tablet 4  . loratadine (CLARITIN) 10 MG tablet Take 1 tablet (10 mg total) by mouth daily. For up to 1 month, then as needed for seasonal allergies. 30 tablet 3  . losartan-hydrochlorothiazide (HYZAAR) 100-25 MG tablet TAKE 1 TABLET BY MOUTH  DAILY 90 tablet 3  . Lutein-Zeaxanthin 15-0.7 MG CAPS Take 1 capsule by mouth daily.    . metoprolol tartrate (LOPRESSOR) 25 MG tablet TAKE 1 TABLET BY MOUTH TWO  TIMES DAILY 180 tablet 3  . Multiple Vitamin (MULTIVITAMIN) tablet Take 1 tablet by mouth daily.    . Omega-3 Fatty Acids (FISH OIL PO) Take 1 capsule by mouth daily.    . pravastatin (PRAVACHOL) 20 MG tablet TAKE 1 TABLET BY MOUTH  DAILY 90 tablet 3  . Saccharomyces boulardii (PROBIOTIC) 250 MG CAPS Take 250 mg by mouth daily.     No current facility-administered medications for this visit.     Review of Systems Review of Systems  Constitutional: Negative.   Respiratory: Negative.   Cardiovascular: Negative.     Blood pressure 122/74, pulse 68, resp. rate 12, height 5\' 3"  (1.6 m), weight 136 lb (61.7 kg).  Physical Exam Physical Exam  Constitutional: She is oriented to person,  place, and time. She appears well-developed and well-nourished.  Eyes: Conjunctivae are normal. No scleral icterus.  Neck: Neck supple.  Cardiovascular: Normal rate, regular rhythm and normal heart sounds.   Pulmonary/Chest: Effort normal and breath sounds normal. Right breast exhibits no inverted nipple, no mass, no nipple discharge, no skin change and no tenderness. Left breast exhibits no inverted nipple, no mass, no nipple discharge, no skin change and no tenderness. Breasts are symmetrical.    Abdominal: Soft. Bowel sounds are normal. She exhibits no distension. There is no hepatomegaly. There is no tenderness.  Lymphadenopathy:    She has no cervical adenopathy.    She has no axillary adenopathy.  Neurological: She is alert and oriented to person, place, and time.  Skin: Skin is warm and dry.    Psychiatric: She has a normal mood and affect.    Data Reviewed Prior note  CA 27-29 stable and normal  Assessment   CA left breast, T1a,N0, ER/PR pos,her 2 neg,  s/p left lumpectomy with radiation tx in January 2016. Last mammogram was in Jan of this yr. Pt is on Letrazole and doing well.    Plan    HPI, Physical Exam, Assessment and Plan have been scribed under the direction and in the presence of Mckinley Jewel, MD.  Verlene Mayer, CMA  I have completed the exam and reviewed the above documentation for accuracy and completeness.  I agree with the above.  Haematologist has been used and any errors in dictation or transcription are unintentional.  Uriah Philipson G. Jamal Collin, M.D., F.A.C.S.     Junie Panning G 04/23/2017, 1:44 PM

## 2017-05-01 ENCOUNTER — Ambulatory Visit (INDEPENDENT_AMBULATORY_CARE_PROVIDER_SITE_OTHER): Payer: Medicare Other

## 2017-05-01 VITALS — BP 118/70 | HR 62 | Temp 98.2°F | Resp 16 | Ht 63.0 in | Wt 135.8 lb

## 2017-05-01 DIAGNOSIS — Z0000 Encounter for general adult medical examination without abnormal findings: Secondary | ICD-10-CM | POA: Diagnosis not present

## 2017-05-01 NOTE — Patient Instructions (Addendum)
Rebecca Mitchell , Thank you for taking time to come for your Medicare Wellness Visit. I appreciate your ongoing commitment to your health goals. Please review the following plan we discussed and let me know if I can assist you in the future.   Screening recommendations/referrals: Colonoscopy: completed 09/21/2015 Mammogram: completed 10/17/2016 Bone Density: completed 08/17/2004- appt in September  Recommended yearly ophthalmology/optometry visit for glaucoma screening and checkup Recommended yearly dental visit for hygiene and checkup  Vaccinations: Influenza vaccine: up to date, due 05/2017 Pneumococcal vaccine: up to date Tdap vaccine: up to date Shingles vaccine: due, check with your insurance company   Advanced directives: Advance directive discussed with you today. I have provided a copy for you to complete at home and have notarized. Once this is complete please bring a copy in to our office so we can scan it into your chart.  Conditions/risks identified: none  Next appointment:  Follow up on 12/13/2017 for lab work at [facility] Follow up on 12/19/2017 at 1:20pm with Dr.Karamalegos. Follow up in one year for your annual wellness exam.  Preventive Care 65 Years and Older, Female Preventive care refers to lifestyle choices and visits with your health care provider that can promote health and wellness. What does preventive care include?  A yearly physical exam. This is also called an annual well check.  Dental exams once or twice a year.  Routine eye exams. Ask your health care provider how often you should have your eyes checked.  Personal lifestyle choices, including:  Daily care of your teeth and gums.  Regular physical activity.  Eating a healthy diet.  Avoiding tobacco and drug use.  Limiting alcohol use.  Practicing safe sex.  Taking low-dose aspirin every day.  Taking vitamin and mineral supplements as recommended by your health care provider. What happens during an annual  well check? The services and screenings done by your health care provider during your annual well check will depend on your age, overall health, lifestyle risk factors, and family history of disease. Counseling  Your health care provider may ask you questions about your:  Alcohol use.  Tobacco use.  Drug use.  Emotional well-being.  Home and relationship well-being.  Sexual activity.  Eating habits.  History of falls.  Memory and ability to understand (cognition).  Work and work Statistician.  Reproductive health. Screening  You may have the following tests or measurements:  Height, weight, and BMI.  Blood pressure.  Lipid and cholesterol levels. These may be checked every 5 years, or more frequently if you are over 74 years old.  Skin check.  Lung cancer screening. You may have this screening every year starting at age 65 if you have a 30-pack-year history of smoking and currently smoke or have quit within the past 15 years.  Fecal occult blood test (FOBT) of the stool. You may have this test every year starting at age 30.  Flexible sigmoidoscopy or colonoscopy. You may have a sigmoidoscopy every 5 years or a colonoscopy every 10 years starting at age 37.  Hepatitis C blood test.  Hepatitis B blood test.  Sexually transmitted disease (STD) testing.  Diabetes screening. This is done by checking your blood sugar (glucose) after you have not eaten for a while (fasting). You may have this done every 1-3 years.  Bone density scan. This is done to screen for osteoporosis. You may have this done starting at age 81.  Mammogram. This may be done every 1-2 years. Talk to your health care  provider about how often you should have regular mammograms. Talk with your health care provider about your test results, treatment options, and if necessary, the need for more tests. Vaccines  Your health care provider may recommend certain vaccines, such as:  Influenza vaccine. This  is recommended every year.  Tetanus, diphtheria, and acellular pertussis (Tdap, Td) vaccine. You may need a Td booster every 10 years.  Zoster vaccine. You may need this after age 35.  Pneumococcal 13-valent conjugate (PCV13) vaccine. One dose is recommended after age 31.  Pneumococcal polysaccharide (PPSV23) vaccine. One dose is recommended after age 60. Talk to your health care provider about which screenings and vaccines you need and how often you need them. This information is not intended to replace advice given to you by your health care provider. Make sure you discuss any questions you have with your health care provider. Document Released: 10/01/2015 Document Revised: 05/24/2016 Document Reviewed: 07/06/2015 Elsevier Interactive Patient Education  2017 [place] Prevention in the Home Falls can cause injuries. They can happen to people of all ages. There are many things you can do to make your home safe and to help prevent falls. What can I do on the outside of my home?  Regularly fix the edges of walkways and driveways and fix any cracks.  Remove anything that might make you trip as you walk through a door, such as a raised step or threshold.  Trim any bushes or trees on the path to your home.  Use bright outdoor lighting.  Clear any walking paths of anything that might make someone trip, such as rocks or tools.  Regularly check to see if handrails are loose or broken. Make sure that both sides of any steps have handrails.  Any raised decks and porches should have guardrails on the edges.  Have any leaves, snow, or ice cleared regularly.  Use sand or salt on walking paths during winter.  Clean up any spills in your garage right away. This includes oil or grease spills. What can I do in the bathroom?  Use night lights.  Install grab bars by the toilet and in the tub and shower. Do not use towel bars as grab bars.  Use non-skid mats or decals in the tub or  shower.  If you need to sit down in the shower, use a plastic, non-slip stool.  Keep the floor dry. Clean up any water that spills on the floor as soon as it happens.  Remove soap buildup in the tub or shower regularly.  Attach bath mats securely with double-sided non-slip rug tape.  Do not have throw rugs and other things on the floor that can make you trip. What can I do in the bedroom?  Use night lights.  Make sure that you have a light by your bed that is easy to reach.  Do not use any sheets or blankets that are too big for your bed. They should not hang down onto the floor.  Have a firm chair that has side arms. You can use this for support while you get dressed.  Do not have throw rugs and other things on the floor that can make you trip. What can I do in the kitchen?  Clean up any spills right away.  Avoid walking on wet floors.  Keep items that you use a lot in easy-to-reach places.  If you need to reach something above you, use a strong step stool that has a grab bar.  Keep electrical cords out of the way.  Do not use floor polish or wax that makes floors slippery. If you must use wax, use non-skid floor wax.  Do not have throw rugs and other things on the floor that can make you trip. What can I do with my stairs?  Do not leave any items on the stairs.  Make sure that there are handrails on both sides of the stairs and use them. Fix handrails that are broken or loose. Make sure that handrails are as long as the stairways.  Check any carpeting to make sure that it is firmly attached to the stairs. Fix any carpet that is loose or worn.  Avoid having throw rugs at the top or bottom of the stairs. If you do have throw rugs, attach them to the floor with carpet tape.  Make sure that you have a light switch at the top of the stairs and the bottom of the stairs. If you do not have them, ask someone to add them for you. What else can I do to help prevent  falls?  Wear shoes that:  Do not have high heels.  Have rubber bottoms.  Are comfortable and fit you well.  Are closed at the toe. Do not wear sandals.  If you use a stepladder:  Make sure that it is fully opened. Do not climb a closed stepladder.  Make sure that both sides of the stepladder are locked into place.  Ask someone to hold it for you, if possible.  Clearly mark and make sure that you can see:  Any grab bars or handrails.  First and last steps.  Where the edge of each step is.  Use tools that help you move around (mobility aids) if they are needed. These include:  Canes.  Walkers.  Scooters.  Crutches.  Turn on the lights when you go into a dark area. Replace any light bulbs as soon as they burn out.  Set up your furniture so you have a clear path. Avoid moving your furniture around.  If any of your floors are uneven, fix them.  If there are any pets around you, be aware of where they are.  Review your medicines with your doctor. Some medicines can make you feel dizzy. This can increase your chance of falling. Ask your doctor what other things that you can do to help prevent falls. This information is not intended to replace advice given to you by your health care provider. Make sure you discuss any questions you have with your health care provider. Document Released: 07/01/2009 Document Revised: 02/10/2016 Document Reviewed: 10/09/2014 Elsevier Interactive Patient Education  2017 [employer].

## 2017-05-01 NOTE — Progress Notes (Signed)
Subjective:   Rebecca Mitchell is a 76 y.o. female who presents for Medicare Annual (Subsequent) preventive examination.  Review of Systems:  Cardiac Risk Factors include: advanced age (>40men, >76 women);dyslipidemia;hypertension     Objective:     Vitals: BP 118/70 (BP Location: Left Arm, Patient Position: Sitting)   Pulse 62   Temp 98.2 F (36.8 C)   Resp 16   Ht 5\' 3"  (1.6 m)   Wt 135 lb 12.8 oz (61.6 kg)   BMI 24.06 kg/m   Body mass index is 24.06 kg/m.   Tobacco History  Smoking Status  . Never Smoker  Smokeless Tobacco  . Never Used     Counseling given: Not Answered   Past Medical History:  Diagnosis Date  . Anemia   . Breast cancer ([place]) 09/2014   left breast lumpectomy with rad tx  . Hyperlipidemia   . Malignant neoplasm of lower-inner quadrant of left female breast ([place]) 07/2014   left breast  . Neoplasm of left breast   . Osteoporosis    Past Surgical History:  Procedure Laterality Date  . BREAST BIOPSY Left 08/2014   invasive mammary carcinoma  . BREAST SURGERY Left 10/01/2014   lumpectomy  . COLONOSCOPY  2010  . COLONOSCOPY WITH PROPOFOL N/A 09/21/2015   Procedure: COLONOSCOPY WITH PROPOFOL;  Surgeon: Christene Lye, MD;  Location: [facility] ENDOSCOPY;  Service: Endoscopy;  Laterality: N/A;  . UPPER GI ENDOSCOPY  2014   Family History  Problem Relation Age of Onset  . Breast cancer Sister 2  . Heart disease Mother   . Heart disease Father    History  Sexual Activity  . Sexual activity: Not on file    Outpatient Encounter Prescriptions as of 05/01/2017  Medication Sig  . amLODipine (NORVASC) 5 MG tablet TAKE 1 TABLET BY MOUTH  DAILY  . Ascorbic Acid (VITAMIN C) 1000 MG tablet Take 1,000 mg by mouth daily.  Marland Kitchen aspirin EC 81 MG tablet Take 81 mg by mouth daily.  . Calcium Citrate-Vitamin D (CALCIUM CITRATE + PO) Take 1 tablet by mouth daily.  Marland Kitchen co-enzyme Q-10 30 MG capsule Take 30 mg by mouth 3 (three) times daily.  Marland Kitchen FERROUS SULFATE PO  Take 1 tablet by mouth 3 (three) times a week.  . fluticasone (FLONASE) 50 MCG/ACT nasal spray Place 2 sprays into both nostrils daily. For up to 4-6 weeks, then as needed for allergies  . letrozole (FEMARA) 2.5 MG tablet TAKE 1 TABLET BY MOUTH  DAILY  . loratadine (CLARITIN) 10 MG tablet Take 1 tablet (10 mg total) by mouth daily. For up to 1 month, then as needed for seasonal allergies.  Marland Kitchen losartan-hydrochlorothiazide (HYZAAR) 100-25 MG tablet TAKE 1 TABLET BY MOUTH  DAILY  . Lutein-Zeaxanthin 15-0.7 MG CAPS Take 1 capsule by mouth daily.  . metoprolol tartrate (LOPRESSOR) 25 MG tablet TAKE 1 TABLET BY MOUTH TWO  TIMES DAILY  . Multiple Vitamin (MULTIVITAMIN) tablet Take 1 tablet by mouth daily.  . Omega-3 Fatty Acids (FISH OIL PO) Take 1 capsule by mouth daily.  . pravastatin (PRAVACHOL) 20 MG tablet TAKE 1 TABLET BY MOUTH  DAILY  . Saccharomyces boulardii (PROBIOTIC) 250 MG CAPS Take 250 mg by mouth daily.   No facility-administered encounter medications on file as of 05/01/2017.     Activities of Daily Living In your present state of health, do you have any difficulty performing the following activities: 05/01/2017 12/11/2016  Hearing? N N  Vision? N N  Difficulty concentrating or making decisions? N N  Walking or climbing stairs? N N  Dressing or bathing? N N  Doing errands, shopping? N N  Preparing Food and eating ? N -  Using the Toilet? N -  In the past six months, have you accidently leaked urine? N -  Do you have problems with loss of bowel control? N -  Managing your Medications? N -  Managing your Finances? N -  Housekeeping or managing your Housekeeping? N -  Some recent data might be hidden    Patient Care Team: Olin Hauser, DO as PCP - General (Family Medicine) Christene Lye, MD (General Surgery) Noreene Filbert, MD as Referring Physician (Radiation Oncology)    Assessment:     Exercise Activities and Dietary recommendations Current  Exercise Habits: Structured exercise class, Type of exercise: strength training/weights;stretching;walking;exercise ball (class at the gym ), Time (Minutes): > 60, Frequency (Times/Week): 4, Weekly Exercise (Minutes/Week): 0, Intensity: Mild, Exercise limited by: None identified  Goals    None     Fall Risk Fall Risk  05/01/2017 12/11/2016 08/15/2016 04/25/2016 02/07/2016  Falls in the past year? No No No No No   Depression Screen PHQ 2/9 Scores 05/01/2017 12/11/2016 08/15/2016 04/25/2016  PHQ - 2 Score 0 0 0 0     Cognitive Function     6CIT Screen 05/01/2017  What Year? 0 points  What month? 0 points  What time? 0 points  Count back from 20 0 points  Months in reverse 0 points  Repeat phrase 0 points  Total Score 0    Immunization History  Administered Date(s) Administered  . Influenza, High Dose Seasonal PF 08/15/2016  . Influenza,inj,Quad PF,36+ Mos 06/14/2015  . Pneumococcal Polysaccharide-23 08/12/2012   Screening Tests Health Maintenance  Topic Date Due  . INFLUENZA VACCINE  04/18/2017  . TETANUS/TDAP  03/25/2024  . DEXA SCAN  Completed  . PNA vac Low Risk Adult  Completed      Plan:     I have personally reviewed and addressed the Medicare Annual Wellness questionnaire and have noted the following in the patient's chart:  A. Medical and social history B. Use of alcohol, tobacco or illicit drugs  C. Current medications and supplements D. Functional ability and status E.  Nutritional status F.  Physical activity G. Advance directives H. List of other physicians I.  Hospitalizations, surgeries, and ER visits in previous 12 months J.  [place] such as hearing and vision if needed, cognitive and depression L. Referrals and appointments   In addition, I have reviewed and discussed with patient certain preventive protocols, quality metrics, and best practice recommendations. A written personalized care plan for preventive services as well as general  preventive health recommendations were provided to patient.   Signed,  Tyler Aas, LPN Nurse Health Advisor   MD Recommendations:none

## 2017-05-09 DIAGNOSIS — M419 Scoliosis, unspecified: Secondary | ICD-10-CM | POA: Diagnosis not present

## 2017-05-16 DIAGNOSIS — L570 Actinic keratosis: Secondary | ICD-10-CM | POA: Diagnosis not present

## 2017-05-16 DIAGNOSIS — D2330 Other benign neoplasm of skin of unspecified part of face: Secondary | ICD-10-CM | POA: Diagnosis not present

## 2017-05-16 DIAGNOSIS — D485 Neoplasm of uncertain behavior of skin: Secondary | ICD-10-CM | POA: Diagnosis not present

## 2017-05-16 DIAGNOSIS — D0472 Carcinoma in situ of skin of left lower limb, including hip: Secondary | ICD-10-CM | POA: Diagnosis not present

## 2017-05-16 DIAGNOSIS — L578 Other skin changes due to chronic exposure to nonionizing radiation: Secondary | ICD-10-CM | POA: Diagnosis not present

## 2017-05-17 DIAGNOSIS — I10 Essential (primary) hypertension: Secondary | ICD-10-CM | POA: Diagnosis not present

## 2017-05-17 DIAGNOSIS — H2513 Age-related nuclear cataract, bilateral: Secondary | ICD-10-CM | POA: Diagnosis not present

## 2017-06-06 DIAGNOSIS — D0472 Carcinoma in situ of skin of left lower limb, including hip: Secondary | ICD-10-CM | POA: Diagnosis not present

## 2017-06-12 ENCOUNTER — Ambulatory Visit
Admission: RE | Admit: 2017-06-12 | Discharge: 2017-06-12 | Disposition: A | Discharge status: Home / Self Care | Payer: Medicare Other | Source: Ambulatory Visit | Attending: Family Medicine | Admitting: Family Medicine

## 2017-06-12 DIAGNOSIS — M810 Age-related osteoporosis without current pathological fracture: Secondary | ICD-10-CM | POA: Insufficient documentation

## 2017-06-13 ENCOUNTER — Ambulatory Visit (INDEPENDENT_AMBULATORY_CARE_PROVIDER_SITE_OTHER): Payer: Medicare Other

## 2017-06-13 VITALS — Temp 97.8°F

## 2017-06-13 DIAGNOSIS — Z23 Encounter for immunization: Secondary | ICD-10-CM | POA: Diagnosis not present

## 2017-06-26 ENCOUNTER — Ambulatory Visit (INDEPENDENT_AMBULATORY_CARE_PROVIDER_SITE_OTHER): Payer: Medicare Other | Admitting: Family Medicine

## 2017-06-26 ENCOUNTER — Encounter: Payer: Self-pay | Admitting: Family Medicine

## 2017-06-26 VITALS — BP 146/73 | HR 70 | Temp 98.4°F | Resp 16 | Ht 63.0 in | Wt 138.0 lb

## 2017-06-26 DIAGNOSIS — N3001 Acute cystitis with hematuria: Secondary | ICD-10-CM | POA: Diagnosis not present

## 2017-06-26 DIAGNOSIS — M810 Age-related osteoporosis without current pathological fracture: Secondary | ICD-10-CM | POA: Diagnosis not present

## 2017-06-26 DIAGNOSIS — N390 Urinary tract infection, site not specified: Secondary | ICD-10-CM | POA: Diagnosis not present

## 2017-06-26 LAB — POCT URINALYSIS DIPSTICK
BILIRUBIN UA: NEGATIVE
Glucose, UA: NEGATIVE
Ketones, UA: NEGATIVE
NITRITE UA: NEGATIVE
PH UA: 7 (ref 5.0–8.0)
Spec Grav, UA: 1.01 (ref 1.010–1.025)
UROBILINOGEN UA: 0.2 U/dL

## 2017-06-26 MED ORDER — CEPHALEXIN 500 MG PO CAPS: 500.0000 mg | ORAL_CAPSULE | Freq: Three times a day (TID) | ORAL | 0 refills | Status: DC

## 2017-06-26 NOTE — Addendum Note (Signed)
Addended by: Olin Hauser on: 06/26/2017 05:32 PM   Modules accepted: Orders

## 2017-06-26 NOTE — Patient Instructions (Addendum)
Thank you for coming to the clinic today.  1. As discussed, for your osteoporosis, it is severe, T score -4.0, this is above the lower range of osteoporosis at T score -2.5  I do think a medication would be beneficial to be aggressive on managing osteoporosis to reduce your risk of fracture with thin bones.  Starting point is usually oral bisphosphonate therapy such as Alendronate (Fosamax) or other medicines, otherwise, given the severity, I do think you qualify and may benefit MORE from an injectable or IV therapy such as Forteo. As discussed I do not normally initiate this prescription, we often refer to Endocrinology (hormone) specialist and they would manage this if you are interested.  Otherwise, please review this with Dr Patrice Paradise (Spine Specliast) to get their opinion based on your scoliosis and see if they offer any of these treatments through their office, or if he would prefer that I refer you to Endocrinology as well.  ---------------------------  2. You have a Urinary Tract Infection - this is very common, your symptoms are reassuring and you should get better within 1 week on the antibiotics - Start Keflex 500mg  3 times daily for next 7 days, complete entire course, even if feeling better - We sent urine for a culture, we will call you within next few days if we need to change antibiotics - Please drink plenty of fluids, improve hydration over next 1 week  If symptoms worsening, developing nausea / vomiting, worsening back pain, fevers / chills / sweats, then please return for re-evaluation sooner.  To prevent bladder and kidney infections...  1. Drink enough water to keep your pee clear to pale yellow  If you think you are getting another bladder infection, start drinking cranberry juice and come see [place] so we can check the urine.   Please schedule a Follow-up Appointment to: Return if symptoms worsen or fail to improve, for UTI.  If you have any other questions or concerns,  please feel free to call the clinic or send a message through [place]. You may also schedule an earlier appointment if necessary.  Additionally, you may be receiving a survey about your experience at our clinic within a few days to 1 week by e-mail or mail. We value your feedback.  Nobie Putnam, DO [place]

## 2017-06-26 NOTE — Progress Notes (Addendum)
Subjective:    Patient ID: Rebecca Mitchell, female    DOB: [birth year], 76 y.o.   MRN: [id number]  Rebecca Mitchell is a 76 y.o. female presenting on 06/26/2017 for Urinary Tract Infection (onset yesterday frequency, urgency, pain and burning)  Patient presents for a same day appointment.  HPI   UTI - Reports new complaint with UTI symptoms, describes urinary frequency, dysuria burning and urgency. Symptoms started yesterday. No known provoking factor or other potential trigger. Her last UTI was several years ago, it feels similar to today. No known problem to cause recurrent UTIs. Taking Tylenol PRN with mild relief. Never taken AZo before - Denies fever/chills, nausea vomiting, back pain, blood in urine, urinary odor, dyspnea  FOLLOW-UP Severe Osteoporosis - Additional follow-up today briefly on Osteoporosis, she had recent interval DEXA scan completed, prior known history of OP with last dexa in 2004, without results. Now DEXA done 06/12/17 showed severe OP with T score -4.0. She has not been on a bisphosphonate or other med, but was taking Calcium and Vitamin D in past. She was advised to restart supplements. - Complication with known significant scoliosis and small frame as well, has not had fracture but remains now at high risk, she is followed by spine/scoliosis specialist Dr Rennis [place] in [place], she is not due to return there, but he has recommended aggressive OP management (prior to result of DEXA) - She was asked to consider med options oral vs IV/IM including Forteo, she has concerns about Forteo side effects but willing to try either - Denies any fall, injury, trauma back pain or other problem  Health Maintenance: - UTD Flu Shot, 06/13/17  Depression screen [facility] 2/9 05/01/2017 12/11/2016 08/15/2016  Decreased Interest 0 0 0  Down, Depressed, Hopeless 0 0 0  PHQ - 2 Score 0 0 0    Social History  Substance Use Topics  . Smoking status: Never Smoker  . Smokeless tobacco: Never Used    . Alcohol use No    Review of Systems Per HPI unless specifically indicated above     Objective:    BP (!) 146/73   Pulse 70   Temp 98.4 F (36.9 C) (Oral)   Resp 16   Ht 5\' 3"  (1.6 m)   Wt 138 lb (62.6 kg)   BMI 24.45 kg/m   Wt Readings from Last 3 Encounters:  06/26/17 138 lb (62.6 kg)  05/01/17 135 lb 12.8 oz (61.6 kg)  04/23/17 136 lb (61.7 kg)    Physical Exam  Constitutional: She is oriented to person, place, and time. She appears well-developed and well-nourished. No distress.  Well-appearing, comfortable, cooperative  HENT:  Head: Normocephalic and atraumatic.  Mouth/Throat: Oropharynx is clear and moist.  Moist mucus mem  Eyes: Conjunctivae are normal. Right eye exhibits no discharge. Left eye exhibits no discharge.  Cardiovascular: Normal rate and intact distal pulses.   Pulmonary/Chest: Effort normal.  Abdominal: Soft. Bowel sounds are normal. She exhibits no distension and no mass. There is no tenderness.  Musculoskeletal: She exhibits no edema.  No CVAT. No back pain.  Unchanged stable scoliosis on exam. See prior note. Full back exam was not performed today  Neurological: She is alert and oriented to person, place, and time.  Skin: Skin is warm and dry. No rash noted. She is not diaphoretic. No erythema.  Psychiatric: She has a normal mood and affect. Her behavior is normal.  Well groomed, good eye contact, normal speech and thoughts  Nursing  note and vitals reviewed.  Results for orders placed or performed in visit on 06/26/17  POCT Urinalysis Dipstick  Result Value Ref Range   Color, UA pale yellow    Clarity, UA cloudy    Glucose, UA negative    Bilirubin, UA negative    Ketones, UA negative    Spec Grav, UA 1.010 1.010 - 1.025   Blood, UA modrate    pH, UA 7.0 5.0 - 8.0   Protein, UA trace    Urobilinogen, UA 0.2 0.2 or 1.0 E.U./dL   Nitrite, UA negative    Leukocytes, UA Large (3+) (A) Negative      Assessment & Plan:   Problem List  Items Addressed This Visit    OP (osteoporosis)    Concern now with confirmed severe OP T score -4.0 on last DEXA 05/2017, without fracture - High risk on Femara medicine already for history of breast cancer - Complication with significant scoliosis   Plan: 1. Discussion today again potential management options - we have discussed via telephone in interval regarding my recommendations, and now have been awaiting feedback / recommendations from her Spine Scoliosis specialist, have not heard back yet on this, our office has left voicemail message recently 2. I advised her again that we should strongly consider medicine - I am considering Forteo and skipping oral bisphosphonate therapy at this time due to the severity. She will plan to return to Spine Specialist to discuss this again, and see what her options are, we may end up referring her to Endocrinology to manage with these alternative medications in future, if she elects to skip oral option first - Will continue to discuss with patient and may contact her now after office visit to review once more  **UPDATE 06/26/17 - called patient back this afternoon, after further chart review and discussed options once more, given all of the factors influencing her severe osteoporosis, especially still taking Femara for breast cancer, I am concerned that she may need more advanced therapy, and we agree that referral to Endocrinology for further management is warranted at this time. I advised her to continue Femara for now, until she reviews treatment plan with Endocrinology, and then she may contact General Surgery or review with them any potential change or continuing of this therapy in early 2019 with next follow-up. - Referral placed to [facility] Endocrinology today, patient will await apt       Other Visit Diagnoses    Acute cystitis with hematuria    -  Primary  Clinically consistent with UTI and confirmed on UA. No recent UTIs or abx courses. No concern  for pyelo today (no systemic symptoms, neg fever, back pain, n/v).  Plan: 1. UA / micro - neg nitrite, large leuks, blood moderate 2. Ordered Urine culture 3. Keflex 500mg  TID x 7 days 4. Improve PO hydration 5. RTC if no improvement 1-2 weeks, red flags given to return sooner     Relevant Medications   cephALEXin (KEFLEX) 500 MG capsule   Other Relevant Orders   POCT Urinalysis Dipstick (Completed)   CULTURE, URINE COMPREHENSIVE      Meds ordered this encounter  Medications  . cephALEXin (KEFLEX) 500 MG capsule    Sig: Take 1 capsule (500 mg total) by mouth 3 (three) times daily. For 7 days    Dispense:  21 capsule    Refill:  0    Follow up plan: Return if symptoms worsen or fail to improve, for UTI.  A  total of 25 minutes was spent face-to-face with this patient. Greater than 50% of this time was spent in counseling on DEXA scan results with severe Osteoporosis, fracture risk, complications with current medication Femara and future treatment options potential side effects, and coordinating care with her Spine Specialist, and considering options for future management with Endocrinology.   Nobie Putnam, [facility] 06/26/2017, 5:32 PM

## 2017-06-26 NOTE — Assessment & Plan Note (Signed)
Concern now with confirmed severe OP T score -4.0 on last DEXA 05/2017, without fracture - High risk on Femara medicine already for history of breast cancer - Complication with significant scoliosis   Plan: 1. Discussion today again potential management options - we have discussed via telephone in interval regarding my recommendations, and now have been awaiting feedback / recommendations from her Spine Scoliosis specialist, have not heard back yet on this, our office has left voicemail message recently 2. I advised her again that we should strongly consider medicine - I am considering Forteo and skipping oral bisphosphonate therapy at this time due to the severity. She will plan to return to Spine Specialist to discuss this again, and see what her options are, we may end up referring her to Endocrinology to manage with these alternative medications in future, if she elects to skip oral option first - Will continue to discuss with patient and may contact her now after office visit to review once more

## 2017-06-28 LAB — CULTURE, URINE COMPREHENSIVE
MICRO NUMBER:: 81124408
SPECIMEN QUALITY:: ADEQUATE

## 2017-07-05 ENCOUNTER — Other Ambulatory Visit: Payer: Self-pay

## 2017-07-05 DIAGNOSIS — I10 Essential (primary) hypertension: Secondary | ICD-10-CM

## 2017-07-05 MED ORDER — METOPROLOL TARTRATE 25 MG PO TABS: 25.0000 mg | ORAL_TABLET | Freq: Two times a day (BID) | ORAL | 3 refills | Status: DC

## 2017-07-06 ENCOUNTER — Other Ambulatory Visit: Payer: Self-pay

## 2017-07-06 DIAGNOSIS — I10 Essential (primary) hypertension: Secondary | ICD-10-CM

## 2017-07-06 MED ORDER — LOSARTAN POTASSIUM-HCTZ 100-25 MG PO TABS: 1.0000 | ORAL_TABLET | Freq: Every day | ORAL | 3 refills | Status: DC

## 2017-07-24 ENCOUNTER — Other Ambulatory Visit: Payer: Self-pay | Admitting: Family Medicine

## 2017-07-30 ENCOUNTER — Telehealth: Payer: Self-pay | Admitting: *Deleted

## 2017-07-30 NOTE — Telephone Encounter (Signed)
Patient called wanting to talk to a nurse regarding the medication that she is currently taking Letrozole. She stated that she had her bone density test done and it didn't come back with good results. Patient stated that she saw where Letrozole can cause bone weakness. She wants to know could the medication be the cause of her bones weakness and bone test results not good.

## 2017-08-01 NOTE — Telephone Encounter (Signed)
PATIENT CALLED TO CHECK ON HER MESSAGE SHE LEFT ON 07-30-17.SHE WAS ADVISE THE MESSAGE WAS SENT TO DR Jamal Collin AND THAT HE WAS OUT OF TOWN THIS WEEK.JUST AS SOON AS HE RESPONSE TO [place] WE WILL GET BACK TO HER BUT IF SHE HASN'T HEAR ANYTHING BY Wednesday 08-08-2017 TO CALL THE OFFICE.

## 2017-08-02 ENCOUNTER — Other Ambulatory Visit: Payer: Self-pay

## 2017-08-02 DIAGNOSIS — Z170 Estrogen receptor positive status [ER+]: Principal | ICD-10-CM

## 2017-08-02 DIAGNOSIS — C50312 Malignant neoplasm of lower-inner quadrant of left female breast: Secondary | ICD-10-CM

## 2017-08-15 NOTE — Telephone Encounter (Signed)
Patient called to check on her message again that was left on 07/30/17. She would like to know what she needs to do about the medication she is currently taking. She is concern about this since it could be affecting her bone weakness.

## 2017-08-22 ENCOUNTER — Telehealth: Payer: Self-pay | Admitting: *Deleted

## 2017-08-22 NOTE — Telephone Encounter (Signed)
Patient is calling back and is really anxious and wanting to know about the medication that she is currently taking. She has called several times and have not got a call back. See previous telephone encounters.

## 2017-08-23 ENCOUNTER — Ambulatory Visit
Admission: RE | Admit: 2017-08-23 | Discharge: 2017-08-23 | Disposition: A | Discharge status: Home / Self Care | Payer: Medicare Other | Source: Ambulatory Visit | Attending: Radiation Oncology | Admitting: Radiation Oncology

## 2017-08-23 ENCOUNTER — Encounter: Payer: Self-pay | Admitting: Radiation Oncology

## 2017-08-23 ENCOUNTER — Other Ambulatory Visit: Payer: Self-pay

## 2017-08-23 VITALS — BP 125/76 | HR 72 | Temp 98.1°F | Resp 20 | Wt 138.8 lb

## 2017-08-23 DIAGNOSIS — Z79811 Long term (current) use of aromatase inhibitors: Secondary | ICD-10-CM | POA: Diagnosis not present

## 2017-08-23 DIAGNOSIS — M810 Age-related osteoporosis without current pathological fracture: Secondary | ICD-10-CM | POA: Diagnosis not present

## 2017-08-23 DIAGNOSIS — Z170 Estrogen receptor positive status [ER+]: Secondary | ICD-10-CM | POA: Insufficient documentation

## 2017-08-23 DIAGNOSIS — Z923 Personal history of irradiation: Secondary | ICD-10-CM | POA: Diagnosis not present

## 2017-08-23 DIAGNOSIS — C50312 Malignant neoplasm of lower-inner quadrant of left female breast: Secondary | ICD-10-CM | POA: Diagnosis not present

## 2017-08-23 DIAGNOSIS — M419 Scoliosis, unspecified: Secondary | ICD-10-CM | POA: Insufficient documentation

## 2017-08-23 NOTE — Progress Notes (Signed)
Radiation Oncology Follow up Note  Name: Rebecca Mitchell   Date:   08/23/2017 MRN:  [id number] DOB: 1941/01/26    This 76 y.o. female presents to the clinic today for 2.5 year follow-up status post whole breast radiation to her left breast for stage I ER/PR positive invasive mammary carcinoma.  REFERRING PROVIDER: Arlis Porta., MD  HPI: Patient is a 76 year old female now seen out 2-1/2 years status post whole breast radiation to her left breast for invasive mammary carcinoma ER/PR positive HER-2/neu negative. Seen today in routine follow-up she is doing well she specifically denies breast tenderness cough or bone pain.. She recently had a X scan for osteoporosis showing significant disease with significant scoliosis and has been recommended to a spine specialist. She is currently currently on letrozole tolerating that well without side effect. Her last mammogram was a year ago which I have reviewed showing BI-RADS 2 benign disease. She scheduled for another mammogram next month.  COMPLICATIONS OF TREATMENT: none  FOLLOW UP COMPLIANCE: keeps appointments   PHYSICAL EXAM:  BP 125/76   Pulse 72   Temp 98.1 F (36.7 C)   Resp 20   Wt 138 lb 12.5 oz (62.9 kg)   BMI 24.58 kg/m  Lungs are clear to A&P cardiac examination essentially unremarkable with regular rate and rhythm. No dominant mass or nodularity is noted in either breast in 2 positions examined. Incision is well-healed. No axillary or supraclavicular adenopathy is appreciated. Cosmetic result is excellent. Well-developed well-nourished patient in NAD. HEENT reveals PERLA, EOMI, discs not visualized.  Oral cavity is clear. No oral mucosal lesions are identified. Neck is clear without evidence of cervical or supraclavicular adenopathy. Lungs are clear to A&P. Cardiac examination is essentially unremarkable with regular rate and rhythm without murmur rub or thrill. Abdomen is benign with no organomegaly or masses noted. Motor sensory  and DTR levels are equal and symmetric in the upper and lower extremities. Cranial nerves II through XII are grossly intact. Proprioception is intact. No peripheral adenopathy or edema is identified. No motor or sensory levels are noted. Crude visual fields are within normal range.  RADIOLOGY RESULTS: Previous mammograms and bone density study reviewed and compatible with the above-stated findings  PLAN: At the present time patient is doing well from a breast standpoint. She will be evaluated for her osteoporosis with recommendations forthcoming. I'm please were overall progress. I've asked to see her back in 1 year for follow-up. She continues on letrozole at this time. She is scheduled for mammogram next month which I will review when available. Patient knows to call with any concerns.  I would like to take this opportunity to thank you for allowing me to participate in the care of your patient.Armstead Peaks., MD

## 2017-09-05 ENCOUNTER — Telehealth: Payer: Self-pay

## 2017-09-05 DIAGNOSIS — I10 Essential (primary) hypertension: Secondary | ICD-10-CM

## 2017-09-05 MED ORDER — AMLODIPINE BESYLATE 5 MG PO TABS: 5.0000 mg | ORAL_TABLET | Freq: Every day | ORAL | 3 refills | Status: DC

## 2017-09-05 NOTE — Telephone Encounter (Signed)
[facility] 2nd Attempt for refill on: Norvasc 5mg  qd.

## 2017-09-05 NOTE — Telephone Encounter (Signed)
Rx already sent to [facility] Amlodipine 5mg  daily, #90 +3 refills  She was asked to taper down in August 2018, will discuss at future visit to see if successful at reducing to HALF or off med.  Nobie Putnam, DO [facility] 09/05/2017, 5:21 PM

## 2017-10-01 DIAGNOSIS — M810 Age-related osteoporosis without current pathological fracture: Secondary | ICD-10-CM | POA: Diagnosis not present

## 2017-10-10 ENCOUNTER — Emergency Department: Payer: Medicare Other

## 2017-10-10 ENCOUNTER — Other Ambulatory Visit: Payer: Self-pay

## 2017-10-10 ENCOUNTER — Encounter: Payer: Self-pay | Admitting: Emergency Medicine

## 2017-10-10 ENCOUNTER — Emergency Department
Admission: EM | Admit: 2017-10-10 | Discharge: 2017-10-10 | Disposition: A | Discharge status: Home / Self Care | Payer: Medicare Other | Attending: Emergency Medicine | Admitting: Emergency Medicine

## 2017-10-10 DIAGNOSIS — M79605 Pain in left leg: Secondary | ICD-10-CM | POA: Insufficient documentation

## 2017-10-10 DIAGNOSIS — S8992XA Unspecified injury of left lower leg, initial encounter: Secondary | ICD-10-CM | POA: Diagnosis not present

## 2017-10-10 DIAGNOSIS — Z853 Personal history of malignant neoplasm of breast: Secondary | ICD-10-CM | POA: Insufficient documentation

## 2017-10-10 DIAGNOSIS — Z79899 Other long term (current) drug therapy: Secondary | ICD-10-CM | POA: Diagnosis not present

## 2017-10-10 DIAGNOSIS — Z7982 Long term (current) use of aspirin: Secondary | ICD-10-CM | POA: Insufficient documentation

## 2017-10-10 DIAGNOSIS — M79652 Pain in left thigh: Secondary | ICD-10-CM | POA: Diagnosis not present

## 2017-10-10 DIAGNOSIS — I10 Essential (primary) hypertension: Secondary | ICD-10-CM | POA: Insufficient documentation

## 2017-10-10 DIAGNOSIS — S79922A Unspecified injury of left thigh, initial encounter: Secondary | ICD-10-CM | POA: Diagnosis not present

## 2017-10-10 MED ORDER — TRAMADOL HCL 50 MG PO TABS: 50.0000 mg | ORAL_TABLET | Freq: Two times a day (BID) | ORAL | 0 refills | Status: DC | PRN

## 2017-10-10 MED ORDER — TRAMADOL HCL 50 MG PO TABS
50.0000 mg | ORAL_TABLET | Freq: Once | ORAL | Status: AC
  Administered 2017-10-10: 50 mg via ORAL
  Filled 2017-10-10: qty 1

## 2017-10-10 MED ORDER — NAPROXEN 375 MG PO TABS: 375.0000 mg | ORAL_TABLET | Freq: Two times a day (BID) | ORAL | 0 refills | Status: DC

## 2017-10-10 NOTE — ED Triage Notes (Signed)
Pt reports fell yesterday during exercise, since last night has been having pain to entire left leg; denies back pain, denies hip pain; denies any other c/o or injuries

## 2017-10-10 NOTE — ED Notes (Signed)
See triage note  States she fell backwards last pm during exercise class  States she landed on her buttocks  Developed pain to left upper leg about 9 pm  Denies hip pain  States pain is mainly anterior and posterior leg

## 2017-10-10 NOTE — ED Provider Notes (Signed)
[facility] Emergency Department Provider Note   ____________________________________________   First MD Initiated Contact with Patient 10/10/17 0730     (approximate)  I have reviewed the triage vital signs and the nursing notes.   HISTORY  Chief Complaint Leg Pain    HPI Rebecca Mitchell is a 77 y.o. female patient complain left leg pain secondary to fall into an exercise class yesterday.  Patient did pain radiates from mid left thigh to mid tib-fib area.  Patient the pain increases with weightbearing and ambulation.  Patient rates the pain as a 5/10.  No palliative measure for complaint.  Patient described the pain is "achy".  Past Medical History:  Diagnosis Date  . Anemia   . Breast cancer ([place]) 09/2014   left breast lumpectomy with rad tx  . Hyperlipidemia   . Malignant neoplasm of lower-inner quadrant of left female breast ([place]) 07/2014   left breast  . Neoplasm of left breast   . Osteoporosis     Patient Active Problem List   Diagnosis Date Noted  . Allergic rhinitis due to allergen 08/01/2016  . Malignant neoplasm of breast (female) ([place]) 10/13/2014  . Decreased leukocytes 08/12/2012  . Absolute anemia 08/09/2012  . Essential hypertension 08/09/2012  . HLD (hyperlipidemia) 08/09/2012  . OP (osteoporosis) 08/09/2012  . Scoliosis 08/09/2012    Past Surgical History:  Procedure Laterality Date  . BREAST BIOPSY Left 08/2014   invasive mammary carcinoma  . BREAST SURGERY Left 10/01/2014   lumpectomy  . COLONOSCOPY  2010  . COLONOSCOPY WITH PROPOFOL N/A 09/21/2015   Procedure: COLONOSCOPY WITH PROPOFOL;  Surgeon: Christene Lye, MD;  Location: [facility] ENDOSCOPY;  Service: Endoscopy;  Laterality: N/A;  . UPPER GI ENDOSCOPY  2014    Prior to Admission medications   Medication Sig Start Date End Date Taking? Authorizing Provider  amLODipine (NORVASC) 5 MG tablet Take 1 tablet (5 mg total) by mouth daily. 09/05/17   Karamalegos,  Devonne Doughty, DO  Ascorbic Acid (VITAMIN C) 1000 MG tablet Take 1,000 mg by mouth daily.    [provider]  aspirin EC 81 MG tablet Take 81 mg by mouth daily.    [provider]  Calcium Citrate-Vitamin D (CALCIUM CITRATE + PO) Take 1 tablet by mouth daily.    [provider]  co-enzyme Q-10 30 MG capsule Take 30 mg by mouth 3 (three) times daily.    [provider]  FERROUS SULFATE PO Take 1 tablet by mouth 3 (three) times a week.    [provider]  fluticasone (FLONASE) 50 MCG/ACT nasal spray USE 2 SPRAYS IN EACH NOSTRIL ONCE DAILY FOR UP TO 4 TO 6 WEEKS, THEN AS NEEDED FOR ALLERGIES 07/24/17   Parks Ranger, Devonne Doughty, DO  letrozole [facility]) 2.5 MG tablet TAKE 1 TABLET BY MOUTH  DAILY 09/18/16   Christene Lye, MD  loratadine (CLARITIN) 10 MG tablet Take 1 tablet (10 mg total) by mouth daily. For up to 1 month, then as needed for seasonal allergies. 08/01/16   Karamalegos, Devonne Doughty, DO  losartan-hydrochlorothiazide (HYZAAR) 100-25 MG tablet Take 1 tablet by mouth daily. 07/06/17   Karamalegos, Alexander J, DO  Lutein-Zeaxanthin 15-0.7 MG CAPS Take 1 capsule by mouth daily.    [provider]  metoprolol tartrate (LOPRESSOR) 25 MG tablet Take 1 tablet (25 mg total) by mouth 2 (two) times daily. 07/05/17   Karamalegos, Devonne Doughty, DO  Multiple Vitamin (MULTIVITAMIN) tablet Take 1 tablet by mouth  daily.    [provider]  naproxen (NAPROSYN) 375 MG tablet Take 1 tablet (375 mg total) by mouth 2 (two) times daily with a meal. 10/10/17 10/10/18  Sable Feil, PA-C  Omega-3 Fatty Acids (FISH OIL PO) Take 1 capsule by mouth daily.    [provider]  pravastatin (PRAVACHOL) 20 MG tablet TAKE 1 TABLET BY MOUTH  DAILY 11/20/16   Arlis Porta., MD  Saccharomyces boulardii (PROBIOTIC) 250 MG CAPS Take 250 mg by mouth daily.    [provider]  traMADol (ULTRAM) 50 MG tablet Take 1 tablet (50 mg total) by mouth  every 12 (twelve) hours as needed. 10/10/17   Sable Feil, PA-C    Allergies Patient has no known allergies.  Family History  Problem Relation Age of Onset  . Breast cancer Sister 63  . Heart disease Mother   . Heart disease Father     Social History Social History   Tobacco Use  . Smoking status: Never Smoker  . Smokeless tobacco: Never Used  Substance Use Topics  . Alcohol use: No    Alcohol/week: 0.0 oz  . Drug use: No    Review of Systems Constitutional: No fever/chills Eyes: No visual changes. ENT: No sore throat. Cardiovascular: Denies chest pain. Respiratory: Denies shortness of breath. Gastrointestinal: No abdominal pain.  No nausea, no vomiting.  No diarrhea.  No constipation. Genitourinary: Negative for dysuria. Musculoskeletal: Left leg pain n. Skin: Negative for rash. Neurological: Negative for headaches, focal weakness or numbness. Endocrine:Hyperlipidemia and hypertension ____________________________________________   PHYSICAL EXAM:  VITAL SIGNS: ED Triage Vitals [10/10/17 0644]  Enc Vitals Group     BP (!) 149/70     Pulse Rate 72     Resp 20     Temp 97.9 F (36.6 C)     Temp Source Oral     SpO2 100 %     Weight 137 lb (62.1 kg)     Height 5\' 2"  (1.575 m)     Head Circumference      Peak Flow      Pain Score 10     Pain Loc      Pain Edu?      Excl. in [place]?    Constitutional: Alert and oriented. Well appearing and in no acute distress. Neck: No stridor.  No cervical spine tenderness to palpation. Cardiovascular: Normal rate, regular rhythm. Grossly normal heart sounds.  Good peripheral circulation. Respiratory: Normal respiratory effort.  No retractions. Lungs CTAB. Musculoskeletal: No obvious deformity to the left lower extremity.  Patient has moderate guarding palpation of the mid thigh and anterior aspect of the mid tib-fib area.  Patient has full and equal range of motion.  There was no effusion noted in the knee  joint. Neurologic:  Normal speech and language. No gross focal neurologic deficits are appreciated. No gait instability. Skin:  Skin is warm, dry and intact. No rash noted. Psychiatric: Mood and affect are normal. Speech and behavior are normal.  ____________________________________________   LABS (all labs ordered are listed, but only abnormal results are displayed)  Labs Reviewed - No data to display ____________________________________________  EKG   ____________________________________________  RADIOLOGY  Dg Tibia/fibula Left  Result Date: 10/10/2017 CLINICAL DATA:  Fall, left leg pain EXAM: LEFT TIBIA AND FIBULA - 2 VIEW COMPARISON:  None. FINDINGS: There is no evidence of fracture or other focal bone lesions. Soft tissues are unremarkable. IMPRESSION: Negative. Electronically Signed   By: Lennette Bihari  [place] M.D.   On: 10/10/2017 08:45   Dg Femur Min 2 Views Left  Result Date: 10/10/2017 CLINICAL DATA:  Fall.  Left upper leg pain EXAM: LEFT FEMUR 2 VIEWS COMPARISON:  None. FINDINGS: There is no evidence of fracture or other focal bone lesions. Soft tissues are unremarkable. IMPRESSION: Negative. Electronically Signed   By: Rolm Baptise M.D.   On: 10/10/2017 08:45    ____________________________________________   PROCEDURES  Procedure(s) performed: None  Procedures  Critical Care performed: No  ____________________________________________   INITIAL IMPRESSION / ASSESSMENT AND PLAN / ED COURSE  As part of my medical decision making, I reviewed the following data within the electronic MEDICAL RECORD NUMBER    Musculoskeletal pain secondary to fall to the left lower extremity.  Discussed negative x-ray findings with patient.  Patient given discharge care instruction advised take medication as directed.  Patient advised to follow-up PCP if condition persists.      ____________________________________________   FINAL CLINICAL IMPRESSION(S) / ED DIAGNOSES  Final  diagnoses:  Left leg pain     ED Discharge Orders        Ordered    traMADol (ULTRAM) 50 MG tablet  Every 12 hours PRN     10/10/17 0854    naproxen (NAPROSYN) 375 MG tablet  2 times daily with meals     10/10/17 0854       Note:  This document was prepared using Dragon voice recognition software and may include unintentional dictation errors.    Sable Feil, PA-C 10/10/17 0900    Eula Listen, MD 10/10/17 1550

## 2017-10-11 ENCOUNTER — Other Ambulatory Visit: Payer: Self-pay

## 2017-10-18 ENCOUNTER — Ambulatory Visit
Admission: RE | Admit: 2017-10-18 | Discharge: 2017-10-18 | Disposition: A | Discharge status: Home / Self Care | Payer: Medicare Other | Source: Ambulatory Visit | Attending: General Surgery | Admitting: General Surgery

## 2017-10-18 DIAGNOSIS — Z9889 Other specified postprocedural states: Secondary | ICD-10-CM | POA: Insufficient documentation

## 2017-10-18 DIAGNOSIS — Z170 Estrogen receptor positive status [ER+]: Secondary | ICD-10-CM | POA: Diagnosis present

## 2017-10-18 DIAGNOSIS — C50312 Malignant neoplasm of lower-inner quadrant of left female breast: Secondary | ICD-10-CM

## 2017-10-18 DIAGNOSIS — Z853 Personal history of malignant neoplasm of breast: Secondary | ICD-10-CM | POA: Insufficient documentation

## 2017-10-18 DIAGNOSIS — R922 Inconclusive mammogram: Secondary | ICD-10-CM | POA: Diagnosis not present

## 2017-10-18 HISTORY — DX: Personal history of irradiation: Z92.3

## 2017-10-19 ENCOUNTER — Encounter: Payer: Self-pay | Admitting: Family Medicine

## 2017-10-19 ENCOUNTER — Ambulatory Visit (INDEPENDENT_AMBULATORY_CARE_PROVIDER_SITE_OTHER): Payer: Medicare Other | Admitting: Family Medicine

## 2017-10-19 VITALS — BP 136/71 | HR 58 | Temp 98.3°F | Resp 16 | Ht 63.0 in | Wt 136.0 lb

## 2017-10-19 DIAGNOSIS — W19XXXA Unspecified fall, initial encounter: Secondary | ICD-10-CM | POA: Diagnosis not present

## 2017-10-19 DIAGNOSIS — S73192A Other sprain of left hip, initial encounter: Secondary | ICD-10-CM | POA: Diagnosis not present

## 2017-10-19 DIAGNOSIS — M810 Age-related osteoporosis without current pathological fracture: Secondary | ICD-10-CM

## 2017-10-19 DIAGNOSIS — IMO0001 Reserved for inherently not codable concepts without codable children: Secondary | ICD-10-CM

## 2017-10-19 DIAGNOSIS — S76392A Other specified injury of muscle, fascia and tendon of the posterior muscle group at thigh level, left thigh, initial encounter: Principal | ICD-10-CM

## 2017-10-19 NOTE — Patient Instructions (Addendum)
Thank you for coming to the office today.  1. Overall, likely hamstring strain - we do not need any other xrays or treatment  Good news is no fracture from your fall  Follow-up with Dr Gabriel Carina as planned when they notify you about the Prolia, I do think this is a good idea to proceed  May proceed to formal exercise next week, ease into it, let pain be your guide, if pain during activity stop, if sore after that is fine  May take naproxen again OTC 250mg  as needed 1-2 pills per day one every twelve hours with food for 1-2 weeks again if need  Recommend to start taking Tylenol Extra Strength 500mg  tabs - take 1 to 2 tabs per dose (max 1000mg ) every 6-8 hours for pain (take regularly, don't skip a dose for next 7 days), max 24 hour daily dose is 6 tablets or 3000mg . In the future you can repeat the same everyday Tylenol course for 1-2 weeks at a time.   Let me know if need a muscle relaxant.  Please schedule a Follow-up Appointment to: Return in about 4 weeks (around 11/16/2017), or if symptoms worsen or fail to improve, for fall, L hamstring.    If you have any other questions or concerns, please feel free to call the office or send a message through [place]. You may also schedule an earlier appointment if necessary.  Additionally, you may be receiving a survey about your experience at our office within a few days to 1 week by e-mail or mail. We value your feedback.  Rebecca Mitchell, [facility], [facility]   Hamstring Strain Rehab Ask your health care provider which exercises are safe for you. Do exercises exactly as told by your health care provider and adjust them as directed. It is normal to feel mild stretching, pulling, tightness, or discomfort as you do these exercises, but you should stop right away if you feel sudden pain or your pain gets worse.Do not begin these exercises until told by your health care provider. Strengthening exercises These exercises build  strength and endurance in your thighs. Endurance is the ability to use your muscles for a long time, even after your muscles get tired. Exercise A: Straight leg raises ( hip extensors) 1. Lie on your belly on a firm surface. 2. Tense the muscles in your buttocks to lift your left / right leg about 4 inches (10 cm). Keep your knee straight. 3. If you cannot lift your leg this high without arching your back, place a pillow under your hips. 4. Hold the position for __________ seconds. 5. Slowly lower your leg to the starting position and allow it to relax completely before you start the next repetition. Repeat __________ times. Complete this exercise __________ times a day. Exercise B: Bridge ( hip extensors) 1. Lie on your back on a firm surface with your knees bent and your feet flat on the floor. 2. Tighten your buttocks muscles and lift your bottom off the floor until your trunk is level with your thighs. ? You should feel the muscles working in your buttocks and the back of your thighs. ? Do not arch your back. 3. Hold this position for __________ seconds. 4. Slowly lower your hips to the starting position. 5. Let your buttocks muscles relax completely between repetitions. Repeat __________ times. Complete this exercise __________ times a day. If told by your health care provider, keep your bottom lifted off the floor while you slowly walk  your feet away from you as far as you can control. Hold for __________ seconds, then slowly walk your feet back toward you. Exercise C: Lateral walking with band ( hip abductors) 1. Stand in a long hallway. 2. Wrap a loop of exercise band around your legs, just above your knees. 3. Bend your knees gently and drop your hips down and back so your weight is over your heels. 4. Step to the side to move down the length of the hallway, keeping your toes pointed ahead of you and keeping tension in the band. 5. Repeat, leading with your other leg. Repeat  __________ times. Complete this exercise __________ times a day. Exercise D: Single leg stand with reaching ( eccentric hamstring) 1. Stand on your left / right foot. Keep your big toe down on the floor and try to keep your arch lifted. 2. Slowly reach down toward the floor as far as you can while keeping your balance. 3. Hold this position for __________ seconds. Repeat __________ times. Complete this exercise __________ times a day. Exercise E: Prone plank ( abdominals and core) 1. Lie on your belly on the floor and prop yourself up on your elbows. Your hands should be straight out in front of you, and your elbows should be below your shoulders. Position your feet so the balls of your feet touch the ground. The ball of your foot is on the walking surface, right under your toes. 2. Tighten your abdominal muscles and lift your body off the floor. ? Do not arch your back. ? Do not hold your breath. 3. Hold this position for __________ seconds. Repeat __________ times. Complete this exercise __________ times a day. This information is not intended to replace advice given to you by your health care provider. Make sure you discuss any questions you have with your health care provider. Document Released: 09/04/2005 Document Revised: 05/11/2016 Document Reviewed: 06/03/2015 Elsevier Interactive Patient Education  2018 [place] Tendinitis Rehab Ask your health care provider which exercises are safe for you. Do exercises exactly as told by your health care provider and adjust them as directed. It is normal to feel mild stretching, pulling, tightness, or discomfort as you do these exercises, but you should stop right away if you feel sudden pain or your pain gets worse.Do not begin these exercises until told by your health care provider. Stretching and range of motion exercises These exercises warm up your muscles and joints and improve the movement and flexibility of your  thigh. These exercises also help to relieve pain, numbness, and tingling. Exercise A: Hamstring stretch, supine  1. Lie on your back. Loop a belt or towel across the ball of your left / right foot The ball of your foot is on the walking surface, right under your toes. 2. Straighten your left / right knee and slowly pull on the belt to raise your leg. Stop when you feel a gentle stretch behind your left / right knee or thigh. ? Do not allow the knee to bend. ? Keep your other leg flat on the floor. 3. Hold this position for __________ seconds. Repeat __________ times. Complete this exercise __________ times a day. Strengthening exercises These exercises build strength and endurance in your thigh. Endurance is the ability to use your muscles for a long time, even after they get tired. Exercise B: Straight leg raises ( hip extensors) 1. Lie on your belly on a bed or a firm surface with a pillow  under your hips. 2. Bend your left / right knee so your foot is straight up in the air. 3. Squeeze your buttock muscles and lift your left / right thigh off the bed. Do not let your back arch. 4. Hold this position for __________seconds. 5. Slowly return to the starting position. Let your muscles relax completely before you do another repetition. Repeat __________ times. Complete this exercise __________ times a day. Exercise C: Bridge ( hip extensors) 1. Lie on your back on a firm surface with your knees bent and your feet flat on the floor. 2. Tighten your buttocks muscles and lift your bottom off the floor until your trunk is level with your thighs. ? You should feel the muscles working in your buttocks and the back of your thighs. If you do not feel these muscles, slide your feet 1-2 inches (2.5-5 cm) farther away from your buttocks. ? Do not arch your back. 3. Hold this position for __________ seconds. 4. Slowly lower your hips to the starting position. 5. Let your buttocks muscles relax  completely between repetitions. If this exercise is too easy, try doing it with your arms crossed over your chest. Repeat __________ times. Complete this exercise __________ times a day. Exercise D: Hamstring eccentric, prone 4. Lie on your belly on a bed or on the floor. 5. Start with your legs straight. Cross your legs at the ankles with your left / right leg on top. 6. Using your bottom leg to do the work, bend both knees. 7. Using just your left / right leg alone, slowly lower your leg back down toward the bed. Add a __________ weight as told by your health care provider. 8. Let your muscles relax completely between repetitions. Repeat __________ times. Complete this exercise __________ times a day. Exercise E: Squats 4. Stand in front of a table, with your feet and knees pointing straight ahead. You may rest your hands on the table for balance but not for support. 5. Slowly bend your knees and lower your hips like you are going to sit in a chair. Keep your thighs straight or pointed slightly outward. ? Keep your weight over your heels, not over your toes. ? Keep your lower legs upright so they are parallel with the table legs. ? Do not let your hips go lower than your knees. Stop when your knees are bent to the shape of an upside-down letter "L" (90 degree angle). ? Do not bend lower than told by your health care provider. ? If your knee pain increases, do not bend as low. 6. Hold the squat position __________ seconds. 7. Slowly push with your legs to return to standing. Do not use your hands to pull yourself to standing. Repeat __________ times. Complete this exercise __________ times a day. This information is not intended to replace advice given to you by your health care provider. Make sure you discuss any questions you have with your health care provider. Document Released: 09/04/2005 Document Revised: 05/11/2016 Document Reviewed: 06/08/2015 Elsevier Interactive Patient Education   [employer].

## 2017-10-19 NOTE — Assessment & Plan Note (Signed)
Reassuring without fracture Encouraged to continue follow-up with [facility] Endo Dr Gabriel Carina to follow through on the Prolia treatment

## 2017-10-19 NOTE — Progress Notes (Signed)
Subjective:    Patient ID: Rebecca Mitchell, female    DOB: [birth year], 77 y.o.   MRN: [id number]  Rebecca Mitchell is a 77 y.o. female presenting on 10/19/2017 for Hospitalization Follow-up (fall)   HPI   ED FOLLOW-UP VISIT  Hospital/Location: [facility] ED Date of ED Visit: 10/10/17  Reason for Presenting to ED: Fall, Left Leg Pain  FOLLOW-UP - ED provider note and record have been reviewed - Patient presents today about 9 days after recent ED visit. Brief summary of recent course, patient had symptoms of fall, describes she was at exercise class, she stepped back, and she fell backwards and lost balance but no other symptoms, she hit her head on a chair behind her, and fell on her Left side and states she had minor soreness on back of her head but then it resolved did not have laceration. She did not have loss of consciousness dizziness or other associated symptoms. She was fine and able to ambulate without issue initially but did have some pain but not too severe, then later that night she had more acute onset pain and went to hospital ED for evaluation, in hospital she had L TibFib X-ray, L Femur negative for fracture or dislocation, was given Naproxen and Tramadol, she finished Naproxen and did well on NSAID and then took some Tramadol PRN as well with good results, has some left over, never on opiates before - Today still has some soreness and tight muscle spasm in back of L hamstring with some movements lifting leg otherwise most activities do not bother her and she is doing well. Asking about when to resume exercise class and stretching. Not been working on any home exercises or returned to formal exercise yet  - New medications on discharge: Naproxen 375, Tramadol 50mg  PRN - Changes to current meds on discharge: None  Additional history Severe Osteoporosis - Now established with [facility] Endocrinology Dr Gabriel Carina, last seen on 10/01/17, reviewed chart, she was planning to start on Prolia injections,  however needed to get it approved first, this was still in process from chart review. - Patient is pleased at least that she did not have a fracture or other more serious injury after her fall despite OP  Depression screen [facility] 2/9 08/23/2017 05/01/2017 12/11/2016  Decreased Interest 0 0 0  Down, Depressed, Hopeless 0 0 0  PHQ - 2 Score 0 0 0    Social History   Tobacco Use  . Smoking status: Never Smoker  . Smokeless tobacco: Never Used  Substance Use Topics  . Alcohol use: No    Alcohol/week: 0.0 oz  . Drug use: No    Review of Systems Per HPI unless specifically indicated above     Objective:    BP 136/71   Pulse (!) 58   Temp 98.3 F (36.8 C) (Oral)   Resp 16   Ht 5\' 3"  (1.6 m)   Wt 136 lb (61.7 kg)   BMI 24.09 kg/m   Wt Readings from Last 3 Encounters:  10/19/17 136 lb (61.7 kg)  10/10/17 137 lb (62.1 kg)  08/23/17 138 lb 12.5 oz (62.9 kg)    Physical Exam  Constitutional: She is oriented to person, place, and time. She appears well-developed and well-nourished. No distress.  Well-appearing thin elderly 77 yr female, comfortable, cooperative  HENT:  Head: Normocephalic and atraumatic.  Mouth/Throat: Oropharynx is clear and moist.  Eyes: Conjunctivae are normal. Right eye exhibits no discharge. Left eye exhibits no discharge.  Neck: Normal range of motion. Neck supple.  Cardiovascular: Normal rate and intact distal pulses.  Pulmonary/Chest: Effort normal.  Musculoskeletal: She exhibits no edema.  Low Back / L hip Inspection: BACK - Normal appearance, no spinal deformity, symmetrical. HIP - Normal appearance, symmetrical, no obvious leg length or pelvis deformity  Palpation: BACK - No tenderness over spinous processes. Bilateral lumbar paraspinal muscles non-tender and without hypertonicity/spasm. HIP - Non tender to palpation deeper L greater trochanter region of lateral upper thigh. Lower extremity thigh calf soft non tender no spasm. LOWER EXT - Left lower  hamstring area of insertion posterior knee appears mild tender to touch, higher up thigh non tender  ROM: BACK - Full active ROM forward flex / back extension, rotation L/R without discomfort HIP - Bilateral hip flex/ext supine normal, internal and external rotation normal without problem or limitation.  Special Testing: BACK - Seated SLR negative for radicular pain bilaterally HIP - FABER FADIR normal and non tender no limited movement. Acetabular compression of hip negative.  Strength: Bilateral hip flex/ext 5/5, knee flex/ext 5/5, ankle dorsiflex/plantarflex 5/5 Neurovascular: intact distal sensation to light touch  Neurological: She is alert and oriented to person, place, and time.  Skin: Skin is warm and dry. No rash noted. She is not diaphoretic. No erythema.  Psychiatric: She has a normal mood and affect. Her behavior is normal.  Well groomed, good eye contact, normal speech and thoughts  Nursing note and vitals reviewed.  Results for orders placed or performed in visit on 06/26/17  CULTURE, URINE COMPREHENSIVE  Result Value Ref Range   MICRO NUMBER: [id number]    SPECIMEN QUALITY: ADEQUATE    Source OTHER (SPECIFY)    STATUS: FINAL    ISOLATE 1: Streptococcus agalactiae (A)   POCT Urinalysis Dipstick  Result Value Ref Range   Color, UA pale yellow    Clarity, UA cloudy    Glucose, UA negative    Bilirubin, UA negative    Ketones, UA negative    Spec Grav, UA 1.010 1.010 - 1.025   Blood, UA modrate    pH, UA 7.0 5.0 - 8.0   Protein, UA trace    Urobilinogen, UA 0.2 0.2 or 1.0 E.U./dL   Nitrite, UA negative    Leukocytes, UA Large (3+) (A) Negative      Assessment & Plan:    Problem List Items Addressed This Visit    OP (osteoporosis)    Reassuring without fracture Encouraged to continue follow-up with [facility] Endo Dr Gabriel Carina to follow through on the Prolia treatment      Relevant Medications   calcium carbonate 100 mg/ml SUSP    Other Visit Diagnoses    Left  hamstring sprain, initial encounter    -  Primary   Fall, initial encounter       Suspected more MSK related strain vs injury on fall, given localized symptoms and seems gradually improving, negative x-rays and exam not supportive of bony injury or other problem. Reassuring ROM and strength sensation. Improved on NSAID, now no longer using Tramadol - Encouraged her to let pain be guide, resume stretching / activity next week, ease into it, avoid re-injury, may resume exercise class if tolerated next week - May repeat course of Naproxen if needed, has some tramadol for PRN only if needed - Also offered baclofen PRN if needed - declined for now, can reconsider in future if pain resolve but has some tightness / stiffness intefering with activity - Follow-up if not improved,  may need more formal PT     No orders of the defined types were placed in this encounter.    Follow up plan: Return in about 4 weeks (around 11/16/2017), or if symptoms worsen or fail to improve, for fall, L hamstring.  Nobie Putnam, [facility] 10/19/2017, 2:33 PM

## 2017-10-22 ENCOUNTER — Other Ambulatory Visit: Payer: Self-pay | Admitting: General Surgery

## 2017-10-24 ENCOUNTER — Encounter: Payer: Self-pay | Admitting: General Surgery

## 2017-10-24 ENCOUNTER — Ambulatory Visit: Payer: Medicare Other | Admitting: General Surgery

## 2017-10-24 VITALS — BP 130/72 | HR 74 | Resp 12 | Ht 64.0 in | Wt 135.0 lb

## 2017-10-24 DIAGNOSIS — C50312 Malignant neoplasm of lower-inner quadrant of left female breast: Secondary | ICD-10-CM | POA: Diagnosis not present

## 2017-10-24 DIAGNOSIS — Z170 Estrogen receptor positive status [ER+]: Secondary | ICD-10-CM

## 2017-10-24 MED ORDER — LETROZOLE 2.5 MG PO TABS: 2.5000 mg | ORAL_TABLET | Freq: Every day | ORAL | 3 refills | Status: DC

## 2017-10-24 NOTE — Patient Instructions (Signed)
The patient has been asked to return to the office in one year with a bilateral diagnostic mammogram.The patient is aware to call back for any questions or concerns. 

## 2017-10-24 NOTE — Progress Notes (Signed)
Patient ID: Rebecca Mitchell, female   DOB: January 10, 1941, 77 y.o.   MRN: [id number]  Chief Complaint  Patient presents with  . Follow-up    HPI Rebecca Mitchell is a 77 y.o. female who presents for a breast evaluation. The most recent mammogram was done on 10/18/2017. Doing well on Femara. Patient does perform regular self breast checks and gets regular mammograms done.    HPI  Past Medical History:  Diagnosis Date  . Anemia   . Breast cancer ([place]) 09/2014   left breast lumpectomy with rad tx  . Hyperlipidemia   . Malignant neoplasm of lower-inner quadrant of left female breast ([place]) 07/2014   left breast  . Neoplasm of left breast   . Osteoporosis   . Personal history of radiation therapy 2016   left breast ca    Past Surgical History:  Procedure Laterality Date  . BREAST BIOPSY Left 08/2014   invasive mammary carcinoma  . BREAST LUMPECTOMY Left 10/01/2014   invasive mammary carcinoma and DCIS with clear margins  . BREAST SURGERY Left 10/01/2014   lumpectomy  . COLONOSCOPY  2010  . COLONOSCOPY WITH PROPOFOL N/A 09/21/2015   Procedure: COLONOSCOPY WITH PROPOFOL;  Surgeon: Christene Lye, MD;  Location: [facility] ENDOSCOPY;  Service: Endoscopy;  Laterality: N/A;  . UPPER GI ENDOSCOPY  2014    Family History  Problem Relation Age of Onset  . Breast cancer Sister 85  . Heart disease Mother   . Heart disease Father     Social History Social History   Tobacco Use  . Smoking status: Never Smoker  . Smokeless tobacco: Never Used  Substance Use Topics  . Alcohol use: No    Alcohol/week: 0.0 oz  . Drug use: No    No Known Allergies  Current Outpatient Medications  Medication Sig Dispense Refill  . amLODipine (NORVASC) 5 MG tablet Take 1 tablet (5 mg total) by mouth daily. 90 tablet 3  . Ascorbic Acid (VITAMIN C) 1000 MG tablet Take 1,000 mg by mouth daily.    Marland Kitchen aspirin EC 81 MG tablet Take 81 mg by mouth daily.    . calcium carbonate 100 mg/ml SUSP Take by mouth.    .  Calcium Citrate-Vitamin D (CALCIUM CITRATE + PO) Take 1 tablet by mouth daily.    Marland Kitchen co-enzyme Q-10 30 MG capsule Take 30 mg by mouth 3 (three) times daily.    Marland Kitchen FERROUS SULFATE PO Take 1 tablet by mouth 3 (three) times a week.    . fluticasone (FLONASE) 50 MCG/ACT nasal spray USE 2 SPRAYS IN EACH NOSTRIL ONCE DAILY FOR UP TO 4 TO 6 WEEKS, THEN AS NEEDED FOR ALLERGIES 16 g 5  . letrozole (FEMARA) 2.5 MG tablet Take 1 tablet (2.5 mg total) by mouth daily. 90 tablet 3  . loratadine (CLARITIN) 10 MG tablet Take 1 tablet (10 mg total) by mouth daily. For up to 1 month, then as needed for seasonal allergies. 30 tablet 3  . losartan-hydrochlorothiazide (HYZAAR) 100-25 MG tablet Take 1 tablet by mouth daily. 90 tablet 3  . Lutein-Zeaxanthin 15-0.7 MG CAPS Take 1 capsule by mouth daily.    . metoprolol tartrate (LOPRESSOR) 25 MG tablet Take 1 tablet (25 mg total) by mouth 2 (two) times daily. 180 tablet 3  . Multiple Vitamin (MULTIVITAMIN) tablet Take 1 tablet by mouth daily.    . naproxen (NAPROSYN) 375 MG tablet Take 1 tablet (375 mg total) by mouth 2 (two) times daily with a  meal. 10 tablet 0  . Omega-3 Fatty Acids (FISH OIL PO) Take 1 capsule by mouth daily.    . pravastatin (PRAVACHOL) 20 MG tablet TAKE 1 TABLET BY MOUTH  DAILY 90 tablet 3  . Saccharomyces boulardii (PROBIOTIC) 250 MG CAPS Take 250 mg by mouth daily.     No current facility-administered medications for this visit.     Review of Systems Review of Systems  Constitutional: Negative.   Respiratory: Negative.   Cardiovascular: Negative.     Blood pressure 130/72, pulse 74, resp. rate 12, height 5\' 4"  (1.626 m), weight 135 lb (61.2 kg).  Physical Exam Physical Exam  Constitutional: She is oriented to person, place, and time. She appears well-developed and well-nourished.  Eyes: Conjunctivae are normal. No scleral icterus.  Neck: Neck supple.  Cardiovascular: Normal rate, regular rhythm and normal heart sounds.   Pulmonary/Chest: Effort normal and breath sounds normal. Right breast exhibits no inverted nipple, no mass, no nipple discharge, no skin change and no tenderness. Left breast exhibits no inverted nipple, no mass, no nipple discharge, no skin change and no tenderness.    Lymphadenopathy:    She has no cervical adenopathy.    She has no axillary adenopathy.  Neurological: She is alert and oriented to person, place, and time.  Skin: Skin is warm and dry.    Data Reviewed October 18, 2017 bilateral diagnostic mammograms reviewed.  Scant postsurgical changes.  BI-RADS-2.  Assessment    No evidence of recurrent cancer.  Good tolerance of letrozole.    Plan  The patient has been asked to return to the office in one year with a bilateral diagnostic mammogram. Rx sent .The patient is aware to call back for any questions or concerns.  HPI, Physical Exam, Assessment and Plan have been scribed under the direction and in the presence of Hervey Ard, MD.  Gaspar Cola, CMA   I have completed the exam and reviewed the above documentation for accuracy and completeness.  I agree with the above.  Haematologist has been used and any errors in dictation or transcription are unintentional.  Hervey Ard, M.D., F.A.C.S.  Rebecca Mitchell Rebecca Mitchell 10/24/2017, 8:46 PM

## 2017-11-08 ENCOUNTER — Other Ambulatory Visit: Payer: Self-pay | Admitting: Family Medicine

## 2017-11-08 DIAGNOSIS — E785 Hyperlipidemia, unspecified: Secondary | ICD-10-CM

## 2017-11-08 MED ORDER — PRAVASTATIN SODIUM 20 MG PO TABS: 20.0000 mg | ORAL_TABLET | Freq: Every day | ORAL | 3 refills | Status: DC

## 2017-11-13 DIAGNOSIS — M810 Age-related osteoporosis without current pathological fracture: Secondary | ICD-10-CM | POA: Diagnosis not present

## 2017-12-12 ENCOUNTER — Other Ambulatory Visit: Payer: Self-pay

## 2017-12-12 DIAGNOSIS — Z859 Personal history of malignant neoplasm, unspecified: Secondary | ICD-10-CM | POA: Diagnosis not present

## 2017-12-12 DIAGNOSIS — R7309 Other abnormal glucose: Secondary | ICD-10-CM

## 2017-12-12 DIAGNOSIS — I10 Essential (primary) hypertension: Secondary | ICD-10-CM

## 2017-12-12 DIAGNOSIS — D509 Iron deficiency anemia, unspecified: Secondary | ICD-10-CM

## 2017-12-12 DIAGNOSIS — L578 Other skin changes due to chronic exposure to nonionizing radiation: Secondary | ICD-10-CM | POA: Diagnosis not present

## 2017-12-12 DIAGNOSIS — Z872 Personal history of diseases of the skin and subcutaneous tissue: Secondary | ICD-10-CM | POA: Diagnosis not present

## 2017-12-12 DIAGNOSIS — M810 Age-related osteoporosis without current pathological fracture: Secondary | ICD-10-CM

## 2017-12-12 DIAGNOSIS — L821 Other seborrheic keratosis: Secondary | ICD-10-CM | POA: Diagnosis not present

## 2017-12-12 DIAGNOSIS — E782 Mixed hyperlipidemia: Secondary | ICD-10-CM

## 2017-12-12 NOTE — Addendum Note (Signed)
Addended by: Pearson Grippe on: 12/12/2017 10:49 AM   Modules accepted: Orders

## 2017-12-13 ENCOUNTER — Other Ambulatory Visit: Payer: Medicare Other

## 2017-12-13 DIAGNOSIS — D509 Iron deficiency anemia, unspecified: Secondary | ICD-10-CM | POA: Diagnosis not present

## 2017-12-13 DIAGNOSIS — I10 Essential (primary) hypertension: Secondary | ICD-10-CM | POA: Diagnosis not present

## 2017-12-13 DIAGNOSIS — R7309 Other abnormal glucose: Secondary | ICD-10-CM | POA: Diagnosis not present

## 2017-12-13 DIAGNOSIS — E782 Mixed hyperlipidemia: Secondary | ICD-10-CM | POA: Diagnosis not present

## 2017-12-13 DIAGNOSIS — E559 Vitamin D deficiency, unspecified: Secondary | ICD-10-CM | POA: Diagnosis not present

## 2017-12-13 DIAGNOSIS — M810 Age-related osteoporosis without current pathological fracture: Secondary | ICD-10-CM | POA: Diagnosis not present

## 2017-12-14 ENCOUNTER — Other Ambulatory Visit: Payer: Medicare Other

## 2017-12-14 ENCOUNTER — Encounter: Payer: Self-pay | Admitting: Family Medicine

## 2017-12-14 DIAGNOSIS — R7309 Other abnormal glucose: Secondary | ICD-10-CM

## 2017-12-14 DIAGNOSIS — R7303 Prediabetes: Secondary | ICD-10-CM | POA: Insufficient documentation

## 2017-12-14 LAB — COMPLETE METABOLIC PANEL WITH GFR
AG RATIO: 2 (calc) (ref 1.0–2.5)
ALT: 14 U/L (ref 6–29)
AST: 18 U/L (ref 10–35)
Albumin: 4.3 g/dL (ref 3.6–5.1)
Alkaline phosphatase (APISO): 47 U/L (ref 33–130)
BILIRUBIN TOTAL: 0.8 mg/dL (ref 0.2–1.2)
BUN: 16 mg/dL (ref 7–25)
CALCIUM: 9.3 mg/dL (ref 8.6–10.4)
CO2: 30 mmol/L (ref 20–32)
Chloride: 104 mmol/L (ref 98–110)
Creat: 0.75 mg/dL (ref 0.60–0.93)
GFR, Est African American: 90 mL/min/{1.73_m2} (ref >=60)
GFR, Est Non African American: 77 mL/min/{1.73_m2} (ref >=60)
Globulin: 2.1 g/dL (calc) (ref 1.9–3.7)
Glucose, Bld: 100 mg/dL — ABNORMAL HIGH (ref 65–99)
POTASSIUM: 4 mmol/L (ref 3.5–5.3)
Sodium: 141 mmol/L (ref 135–146)
Total Protein: 6.4 g/dL (ref 6.1–8.1)

## 2017-12-14 LAB — LIPID PANEL
Cholesterol: 174 mg/dL (ref <200)
HDL: 76 mg/dL (ref >50)
LDL Cholesterol (Calc): 82 mg/dL (calc)
Non-HDL Cholesterol (Calc): 98 mg/dL (calc) (ref <130)
TRIGLYCERIDES: 75 mg/dL (ref <150)
Total CHOL/HDL Ratio: 2.3 (calc) (ref <5.0)

## 2017-12-14 LAB — CBC WITH DIFFERENTIAL/PLATELET
BASOS PCT: 1.1 %
Basophils Absolute: 52 cells/uL (ref 0–200)
Eosinophils Absolute: 240 cells/uL (ref 15–500)
Eosinophils Relative: 5.1 %
HCT: 39.1 % (ref 35.0–45.0)
HEMOGLOBIN: 13.1 g/dL (ref 11.7–15.5)
LYMPHS ABS: 1147 {cells}/uL (ref 850–3900)
MCH: 31.6 pg (ref 27.0–33.0)
MCHC: 33.5 g/dL (ref 32.0–36.0)
MCV: 94.4 fL (ref 80.0–100.0)
MPV: 11.9 fL (ref 7.5–12.5)
Monocytes Relative: 10.1 %
NEUTROS ABS: 2787 {cells}/uL (ref 1500–7800)
Neutrophils Relative %: 59.3 %
PLATELETS: 192 10*3/uL (ref 140–400)
RBC: 4.14 10*6/uL (ref 3.80–5.10)
RDW: 11.9 % (ref 11.0–15.0)
Total Lymphocyte: 24.4 %
WBC: 4.7 10*3/uL (ref 3.8–10.8)
WBCMIX: 475 {cells}/uL (ref 200–950)

## 2017-12-14 LAB — HEMOGLOBIN A1C
Hgb A1c MFr Bld: 5.8 % of total Hgb — ABNORMAL HIGH (ref <5.7)
Mean Plasma Glucose: 120 (calc)
eAG (mmol/L): 6.6 (calc)

## 2017-12-14 LAB — VITAMIN D 25 HYDROXY (VIT D DEFICIENCY, FRACTURES): Vit D, 25-Hydroxy: 48 ng/mL (ref 30–100)

## 2017-12-19 ENCOUNTER — Ambulatory Visit (INDEPENDENT_AMBULATORY_CARE_PROVIDER_SITE_OTHER): Payer: Medicare Other | Admitting: Family Medicine

## 2017-12-19 ENCOUNTER — Encounter: Payer: Self-pay | Admitting: Family Medicine

## 2017-12-19 VITALS — BP 130/66 | HR 56 | Temp 97.4°F | Resp 16 | Ht 64.0 in | Wt 135.0 lb

## 2017-12-19 DIAGNOSIS — M810 Age-related osteoporosis without current pathological fracture: Secondary | ICD-10-CM

## 2017-12-19 DIAGNOSIS — R7309 Other abnormal glucose: Secondary | ICD-10-CM

## 2017-12-19 DIAGNOSIS — I10 Essential (primary) hypertension: Secondary | ICD-10-CM | POA: Diagnosis not present

## 2017-12-19 DIAGNOSIS — Z853 Personal history of malignant neoplasm of breast: Secondary | ICD-10-CM | POA: Diagnosis not present

## 2017-12-19 DIAGNOSIS — Z0000 Encounter for general adult medical examination without abnormal findings: Secondary | ICD-10-CM | POA: Diagnosis not present

## 2017-12-19 NOTE — Patient Instructions (Addendum)
Thank you for coming to the office today.  Keep up the good work  Only slight concern is elevated A1c 5.8 (blood sugar over 3 months)  This may be new or likely stable from previous. Your other sugar readings are mostly normal.  Recommend to make a couple dietary changes to lower starch and carbs, otherwise continue with balanced healthy diet, drink water, and stay active.  Please schedule a Follow-up Appointment to: Return in about 6 months (around 06/20/2018) for Elevated A1c, osteoporosis.  If you have any other questions or concerns, please feel free to call the office or send a message through [place]. You may also schedule an earlier appointment if necessary.  Additionally, you may be receiving a survey about your experience at our office within a few days to 1 week by e-mail or mail. We value your feedback.  Nobie Putnam, DO [place]

## 2017-12-19 NOTE — Assessment & Plan Note (Signed)
Stable without fracture Followed by [facility] Endocrinology Dr Gabriel Carina On Prolia, 1st injection 10/2017, next in 6 months Last DEXA 05/2017

## 2017-12-19 NOTE — Assessment & Plan Note (Addendum)
S/p lumpectomy and radiation Now on Femara No evidence of active breast cancer Surveillance per general surgery Dr Bary Castilla

## 2017-12-19 NOTE — Assessment & Plan Note (Signed)
Well-controlled HTN - Home BP readings normal  No known complications   Plan:  1. Continue current BP regimen Losartan-HCTZ 100-25mg  daily, Metoprolol 25mg  BID, Amlodipine 5mg  daily - again future consideration reduce amlodipine if low BP or symptoms 2. Encourage improved lifestyle - low sodium diet, regular exercise 3.  Continue monitor BP outside office, bring readings to next visit, if persistently >140/90 or new symptoms notify office sooner 4. Follow-up q 6  mo

## 2017-12-19 NOTE — Progress Notes (Signed)
Subjective:    Patient ID: Rebecca Mitchell, female    DOB: 12-29-1940, 77 y.o.   MRN: [id number]  Rebecca Mitchell is a 77 y.o. female presenting on 12/19/2017 for Hypertension   HPI   Here for Annual Physical and Lab Review, and follow-up HTN.  Elevated A1c Reports no concerns or prior diagnosis. Previous sugar readings were stable to normal. No prior A1c for comparison CBGs: Not checking, not indicated Meds: Never on meds Currently on ARB Lifestyle: - Diet (balanced diet, tries to eat low carb, mostly water, agreeable to making some other changes to help diet)  - Exercise (6 days a week, exercise classes and upper/lower body, some chair yoga exercises) Denies hypoglycemia  Severe Osteoporosis - Recent history, referred to [facility] Endocrinology saw Dr Gabriel Carina 10/01/17, evaluation for OP, given Ca/Vit D supplement, rx for Prolia to receive injections at their office, 1st injection given on 11/13/17, next due in 6 months, see prior notes for background information. - Today patient reports no new concerns. She remains active with exercise, cardio and wt bearing, trying to be careful to avoid injury, will f/u with endo for DEXA repeat in future  CHRONIC HTN: Reports no new concerns. Checks BP at home normal 120/60-70s Current Meds - Amlodipine 5mg  daily, Losartan-HCTZ 100-25mg  daily, Metoprolol 25mg  BID Reports good compliance, took meds today. Tolerating well, w/o complaints.  Chronic Scoliosis / Chronic Back Pain - Reports chronic problem with scoliosis since adolescent. Has been re-established with Dr Rennis Harding (Spine & Scoliosis Specialist in [place]). Doing well without pain, or any reduced breathing, muscle spasm or soreness.   Additional updates - Last seen for Left hamstring leg muscle strain, has since resolved. She takes occasional Tylenol PRN. Remains active now see above lifestyle  Health Maintenance: UTD Flu vaccine, TDap, PNA vaccine series Last DEXA scan  06/12/17  History of breast cancer, L breast. S/p lumpectomy and radiation. On Femara. Surveillance by Dr Bary Castilla, last seen 10/2017, no recurrence.  Depression screen [facility] 2/9 12/19/2017 08/23/2017 05/01/2017  Decreased Interest 0 0 0  Down, Depressed, Hopeless 0 0 0  PHQ - 2 Score 0 0 0    Past Medical History:  Diagnosis Date  . Anemia   . Breast cancer ([place]) 09/2014   left breast lumpectomy with rad tx  . Hyperlipidemia   . Malignant neoplasm of lower-inner quadrant of left female breast ([place]) 07/2014   left breast  . Osteoporosis   . Personal history of radiation therapy 2016   left breast ca   Past Surgical History:  Procedure Laterality Date  . BREAST BIOPSY Left 08/2014   invasive mammary carcinoma  . BREAST LUMPECTOMY Left 10/01/2014   invasive mammary carcinoma and DCIS with clear margins  . BREAST SURGERY Left 10/01/2014   lumpectomy  . COLONOSCOPY  2010  . COLONOSCOPY WITH PROPOFOL N/A 09/21/2015   Procedure: COLONOSCOPY WITH PROPOFOL;  Surgeon: Christene Lye, MD;  Location: [facility] ENDOSCOPY;  Service: Endoscopy;  Laterality: N/A;  . UPPER GI ENDOSCOPY  2014   Social History   Socioeconomic History  . Marital status: Married    Spouse name: Not on file  . Number of children: Not on file  . Years of education: Not on file  . Highest education level: Not on file  Occupational History  . Not on file  Social Needs  . Financial resource strain: Not on file  . Food insecurity:    Worry: Not on file    Inability: Not  on file  . Transportation needs:    Medical: Not on file    Non-medical: Not on file  Tobacco Use  . Smoking status: Never Smoker  . Smokeless tobacco: Never Used  Substance and Sexual Activity  . Alcohol use: No    Alcohol/week: 0.0 oz  . Drug use: No  . Sexual activity: Not on file  Lifestyle  . Physical activity:    Days per week: Not on file    Minutes per session: Not on file  . Stress: Not on file  Relationships  . Social  connections:    Talks on phone: Not on file    Gets together: Not on file    Attends religious service: Not on file    Active member of club or organization: Not on file    Attends meetings of clubs or organizations: Not on file    Relationship status: Not on file  . Intimate partner violence:    Fear of current or ex partner: Not on file    Emotionally abused: Not on file    Physically abused: Not on file    Forced sexual activity: Not on file  Other Topics Concern  . Not on file  Social History Narrative  . Not on file   Family History  Problem Relation Age of Onset  . Breast cancer Sister 69  . Heart disease Mother   . Heart disease Father    Current Outpatient Medications on File Prior to Visit  Medication Sig  . amLODipine (NORVASC) 5 MG tablet Take 1 tablet (5 mg total) by mouth daily.  . Ascorbic Acid (VITAMIN C) 1000 MG tablet Take 1,000 mg by mouth daily.  Marland Kitchen aspirin EC 81 MG tablet Take 81 mg by mouth daily.  . calcium carbonate 100 mg/ml SUSP Take by mouth.  . Calcium Citrate-Vitamin D (CALCIUM CITRATE + PO) Take 1 tablet by mouth daily.  Marland Kitchen co-enzyme Q-10 30 MG capsule Take 30 mg by mouth 3 (three) times daily.  Marland Kitchen denosumab (PROLIA) 60 MG/ML SOLN injection Inject into the skin.  Marland Kitchen FERROUS SULFATE PO Take 1 tablet by mouth 3 (three) times a week.  . fluticasone (FLONASE) 50 MCG/ACT nasal spray USE 2 SPRAYS IN EACH NOSTRIL ONCE DAILY FOR UP TO 4 TO 6 WEEKS, THEN AS NEEDED FOR ALLERGIES  . letrozole (FEMARA) 2.5 MG tablet Take 1 tablet (2.5 mg total) by mouth daily.  Marland Kitchen loratadine (CLARITIN) 10 MG tablet Take 1 tablet (10 mg total) by mouth daily. For up to 1 month, then as needed for seasonal allergies.  Marland Kitchen losartan-hydrochlorothiazide (HYZAAR) 100-25 MG tablet Take 1 tablet by mouth daily.  . Lutein-Zeaxanthin 15-0.7 MG CAPS Take 1 capsule by mouth daily.  . metoprolol tartrate (LOPRESSOR) 25 MG tablet Take 1 tablet (25 mg total) by mouth 2 (two) times daily.  . Multiple  Vitamin (MULTIVITAMIN) tablet Take 1 tablet by mouth daily.  . Omega-3 Fatty Acids (FISH OIL PO) Take 1 capsule by mouth daily.  . pravastatin (PRAVACHOL) 20 MG tablet Take 1 tablet (20 mg total) by mouth daily.  . Saccharomyces boulardii (PROBIOTIC) 250 MG CAPS Take 250 mg by mouth daily.   No current facility-administered medications on file prior to visit.     Review of Systems  Constitutional: Negative for activity change, appetite change, chills, diaphoresis, fatigue and fever.  HENT: Negative for congestion and hearing loss.   Eyes: Negative for visual disturbance.  Respiratory: Negative for apnea, cough, choking, chest tightness, shortness  of breath and wheezing.   Cardiovascular: Negative for chest pain, palpitations and leg swelling.  Gastrointestinal: Negative for abdominal pain, anal bleeding, blood in stool, constipation, diarrhea, nausea and vomiting.  Endocrine: Negative for cold intolerance.  Genitourinary: Negative for difficulty urinating, dysuria, frequency and hematuria.  Musculoskeletal: Negative for arthralgias, back pain and neck pain.  Skin: Negative for rash.  Allergic/Immunologic: Negative for environmental allergies.  Neurological: Negative for dizziness, weakness, light-headedness, numbness and headaches.  Hematological: Negative for adenopathy.  Psychiatric/Behavioral: Negative for behavioral problems, dysphoric mood and sleep disturbance. The patient is not nervous/anxious.    Per HPI unless specifically indicated above     Objective:    BP 130/66   Pulse (!) 56   Temp (!) 97.4 F (36.3 C) (Other (Comment))   Resp 16   Ht 5\' 4"  (1.626 m)   Wt 135 lb (61.2 kg)   BMI 23.17 kg/m   Wt Readings from Last 3 Encounters:  12/19/17 135 lb (61.2 kg)  10/24/17 135 lb (61.2 kg)  10/19/17 136 lb (61.7 kg)    Physical Exam  Constitutional: She is oriented to person, place, and time. She appears well-developed and well-nourished. No distress.  Well-appearing  thin 77 yr old female, comfortable, cooperative  HENT:  Head: Normocephalic and atraumatic.  Mouth/Throat: Oropharynx is clear and moist.  Eyes: Pupils are equal, round, and reactive to light. Conjunctivae and EOM are normal. Right eye exhibits no discharge. Left eye exhibits no discharge.  Neck: Normal range of motion. Neck supple. No thyromegaly present.  Cardiovascular: Normal rate, regular rhythm, normal heart sounds and intact distal pulses.  No murmur heard. Pulmonary/Chest: Effort normal and breath sounds normal. No respiratory distress. She has no wheezes. She has no rales.  Abdominal: Soft. Bowel sounds are normal. She exhibits no distension and no mass. There is no tenderness.  Musculoskeletal: Normal range of motion. She exhibits no edema or tenderness.  Stable scoliosis with significant thoracic dextroscoliosis based on curve, non tender, no muscle spasm  Lymphadenopathy:    She has no cervical adenopathy.  Neurological: She is alert and oriented to person, place, and time.  Distal sensation intact to light touch all extremities  Skin: Skin is warm and dry. No rash noted. She is not diaphoretic. No erythema.  Psychiatric: She has a normal mood and affect. Her behavior is normal.  Well groomed, good eye contact, normal speech and thoughts  Nursing note and vitals reviewed.  Results for orders placed or performed in visit on 12/12/17  COMPLETE METABOLIC PANEL WITH GFR  Result Value Ref Range   Glucose, Bld 100 (H) 65 - 99 mg/dL   BUN 16 7 - 25 mg/dL   Creat 0.75 0.60 - 0.93 mg/dL   GFR, Est Non African American 77 > OR = 60 mL/min/1.61m2   GFR, Est African American 90 > OR = 60 mL/min/1.108m2   BUN/Creatinine Ratio NOT APPLICABLE 6 - 22 (calc)   Sodium 141 135 - 146 mmol/L   Potassium 4.0 3.5 - 5.3 mmol/L   Chloride 104 98 - 110 mmol/L   CO2 30 20 - 32 mmol/L   Calcium 9.3 8.6 - 10.4 mg/dL   Total Protein 6.4 6.1 - 8.1 g/dL   Albumin 4.3 3.6 - 5.1 g/dL   Globulin 2.1 1.9  - 3.7 g/dL (calc)   AG Ratio 2.0 1.0 - 2.5 (calc)   Total Bilirubin 0.8 0.2 - 1.2 mg/dL   Alkaline phosphatase (APISO) 47 33 - 130 U/L  AST 18 10 - 35 U/L   ALT 14 6 - 29 U/L  Lipid panel  Result Value Ref Range   Cholesterol 174 <200 mg/dL   HDL 76 >50 mg/dL   Triglycerides 75 <150 mg/dL   LDL Cholesterol (Calc) 82 mg/dL (calc)   Total CHOL/HDL Ratio 2.3 <5.0 (calc)   Non-HDL Cholesterol (Calc) 98 <130 mg/dL (calc)  Hemoglobin A1c  Result Value Ref Range   Hgb A1c MFr Bld 5.8 (H) <5.7 % of total Hgb   Mean Plasma Glucose 120 (calc)   eAG (mmol/L) 6.6 (calc)  CBC with Differential/Platelet  Result Value Ref Range   WBC 4.7 3.8 - 10.8 Thousand/uL   RBC 4.14 3.80 - 5.10 Million/uL   Hemoglobin 13.1 11.7 - 15.5 g/dL   HCT 39.1 35.0 - 45.0 %   MCV 94.4 80.0 - 100.0 fL   MCH 31.6 27.0 - 33.0 pg   MCHC 33.5 32.0 - 36.0 g/dL   RDW 11.9 11.0 - 15.0 %   Platelets 192 140 - 400 Thousand/uL   MPV 11.9 7.5 - 12.5 fL   Neutro Abs 2,787 1,500 - 7,800 cells/uL   Lymphs Abs 1,147 850 - 3,900 cells/uL   WBC mixed population 475 200 - 950 cells/uL   Eosinophils Absolute 240 15 - 500 cells/uL   Basophils Absolute 52 0 - 200 cells/uL   Neutrophils Relative % 59.3 %   Total Lymphocyte 24.4 %   Monocytes Relative 10.1 %   Eosinophils Relative 5.1 %   Basophils Relative 1.1 %  VITAMIN D 25 Hydroxy (Vit-D Deficiency, Fractures)  Result Value Ref Range   Vit D, 25-Hydroxy 48 30 - 100 ng/mL      Assessment & Plan:   Problem List Items Addressed This Visit    Elevated hemoglobin A1c    Mild elevated A1c 5.8, no prior comparison Healthy lifestyle. No other significant risk factors  Plan:  1. Not on any therapy currently  2. Encourage improved lifestyle - reviewed basics of low carb, low sugar diet, continue improving regular exercise 3. Follow-up 6 months repeat A1c POC       Essential hypertension    Well-controlled HTN - Home BP readings normal  No known complications   Plan:   1. Continue current BP regimen Losartan-HCTZ 100-25mg  daily, Metoprolol 25mg  BID, Amlodipine 5mg  daily - again future consideration reduce amlodipine if low BP or symptoms 2. Encourage improved lifestyle - low sodium diet, regular exercise 3.  Continue monitor BP outside office, bring readings to next visit, if persistently >140/90 or new symptoms notify office sooner 4. Follow-up q 6  mo      History of breast cancer in female    S/p lumpectomy and radiation Now on Femara No evidence of active breast cancer Surveillance per general surgery Dr Bary Castilla      OP (osteoporosis)    Stable without fracture Followed by [facility] Endocrinology Dr Gabriel Carina On Prolia, 1st injection 10/2017, next in 6 months Last DEXA 05/2017      Relevant Medications   denosumab (PROLIA) 60 MG/ML SOLN injection    Other Visit Diagnoses    Annual physical exam    -  Primary Up to date on health maintenance, reviewed Reviewed lab results Encourage continue improving diet and lifestyle exercise      No orders of the defined types were placed in this encounter.   Follow up plan: Return in about 6 months (around 06/20/2018) for Elevated A1c, osteoporosis.  Nobie Putnam, DO  [facility] 12/19/2017, 11:33 PM

## 2017-12-19 NOTE — Assessment & Plan Note (Signed)
Mild elevated A1c 5.8, no prior comparison Healthy lifestyle. No other significant risk factors  Plan:  1. Not on any therapy currently  2. Encourage improved lifestyle - reviewed basics of low carb, low sugar diet, continue improving regular exercise 3. Follow-up 6 months repeat A1c POC

## 2018-05-01 ENCOUNTER — Telehealth: Payer: Self-pay | Admitting: Family Medicine

## 2018-05-01 NOTE — Telephone Encounter (Signed)
Called to schedule Medicare Annual Wellness Visit with the Nurse Health Advisor.   If patient returns call, please note: their last AWV was on 05/01/17 please schedule AWV with NHA any date after May 01 2018  Thank you! For any questions please contact: Jill Alexanders [phone] or Skype at: [email or web address]

## 2018-05-14 ENCOUNTER — Ambulatory Visit (INDEPENDENT_AMBULATORY_CARE_PROVIDER_SITE_OTHER): Payer: Medicare Other

## 2018-05-14 VITALS — BP 138/68 | HR 72 | Temp 97.8°F | Resp 16 | Ht 64.0 in | Wt 134.6 lb

## 2018-05-14 DIAGNOSIS — Z0000 Encounter for general adult medical examination without abnormal findings: Secondary | ICD-10-CM | POA: Diagnosis not present

## 2018-05-14 DIAGNOSIS — M810 Age-related osteoporosis without current pathological fracture: Secondary | ICD-10-CM | POA: Diagnosis not present

## 2018-05-14 NOTE — Patient Instructions (Signed)
Rebecca Mitchell , Thank you for taking time to come for your Medicare Wellness Visit. I appreciate your ongoing commitment to your health goals. Please review the following plan we discussed and let me know if I can assist you in the future.   Screening recommendations/referrals: Colonoscopy: completed 09/21/2015 Mammogram: completed 10/18/2017 Bone Density: completed 06/12/2017 Recommended yearly ophthalmology/optometry visit for glaucoma screening and checkup Recommended yearly dental visit for hygiene and checkup  Vaccinations: Influenza vaccine: due 06/2018 Pneumococcal vaccine: up to date Tdap vaccine: up to date Shingles vaccine: shingrix eligible, check with your insurance company for coverage     Advanced directives: Advance directive discussed with you today. Even though you declined this today please call our office should you change your mind and we can give you the proper paperwork for you to fill out.  Conditions/risks identified: Recommend drinking at least 6-8 glasses of water a day   Next appointment: Follow up in one year for your annual wellness exam.    Preventive Care 65 Years and Older, Female Preventive care refers to lifestyle choices and visits with your health care provider that can promote health and wellness. What does preventive care include?  A yearly physical exam. This is also called an annual well check.  Dental exams once or twice a year.  Routine eye exams. Ask your health care provider how often you should have your eyes checked.  Personal lifestyle choices, including:  Daily care of your teeth and gums.  Regular physical activity.  Eating a healthy diet.  Avoiding tobacco and drug use.  Limiting alcohol use.  Practicing safe sex.  Taking low-dose aspirin every day.  Taking vitamin and mineral supplements as recommended by your health care provider. What happens during an annual well check? The services and screenings done by your health care  provider during your annual well check will depend on your age, overall health, lifestyle risk factors, and family history of disease. Counseling  Your health care provider may ask you questions about your:  Alcohol use.  Tobacco use.  Drug use.  Emotional well-being.  Home and relationship well-being.  Sexual activity.  Eating habits.  History of falls.  Memory and ability to understand (cognition).  Work and work Statistician.  Reproductive health. Screening  You may have the following tests or measurements:  Height, weight, and BMI.  Blood pressure.  Lipid and cholesterol levels. These may be checked every 5 years, or more frequently if you are over 65 years old.  Skin check.  Lung cancer screening. You may have this screening every year starting at age 65 if you have a 30-pack-year history of smoking and currently smoke or have quit within the past 15 years.  Fecal occult blood test (FOBT) of the stool. You may have this test every year starting at age 42.  Flexible sigmoidoscopy or colonoscopy. You may have a sigmoidoscopy every 5 years or a colonoscopy every 10 years starting at age 64.  Hepatitis C blood test.  Hepatitis B blood test.  Sexually transmitted disease (STD) testing.  Diabetes screening. This is done by checking your blood sugar (glucose) after you have not eaten for a while (fasting). You may have this done every 1-3 years.  Bone density scan. This is done to screen for osteoporosis. You may have this done starting at age 51.  Mammogram. This may be done every 1-2 years. Talk to your health care provider about how often you should have regular mammograms. Talk with your health care  provider about your test results, treatment options, and if necessary, the need for more tests. Vaccines  Your health care provider may recommend certain vaccines, such as:  Influenza vaccine. This is recommended every year.  Tetanus, diphtheria, and acellular  pertussis (Tdap, Td) vaccine. You may need a Td booster every 10 years.  Zoster vaccine. You may need this after age 4.  Pneumococcal 13-valent conjugate (PCV13) vaccine. One dose is recommended after age 46.  Pneumococcal polysaccharide (PPSV23) vaccine. One dose is recommended after age 82. Talk to your health care provider about which screenings and vaccines you need and how often you need them. This information is not intended to replace advice given to you by your health care provider. Make sure you discuss any questions you have with your health care provider. Document Released: 10/01/2015 Document Revised: 05/24/2016 Document Reviewed: 07/06/2015 Elsevier Interactive Patient Education  2017 [place] Prevention in the Home Falls can cause injuries. They can happen to people of all ages. There are many things you can do to make your home safe and to help prevent falls. What can I do on the outside of my home?  Regularly fix the edges of walkways and driveways and fix any cracks.  Remove anything that might make you trip as you walk through a door, such as a raised step or threshold.  Trim any bushes or trees on the path to your home.  Use bright outdoor lighting.  Clear any walking paths of anything that might make someone trip, such as rocks or tools.  Regularly check to see if handrails are loose or broken. Make sure that both sides of any steps have handrails.  Any raised decks and porches should have guardrails on the edges.  Have any leaves, snow, or ice cleared regularly.  Use sand or salt on walking paths during winter.  Clean up any spills in your garage right away. This includes oil or grease spills. What can I do in the bathroom?  Use night lights.  Install grab bars by the toilet and in the tub and shower. Do not use towel bars as grab bars.  Use non-skid mats or decals in the tub or shower.  If you need to sit down in the shower, use a plastic,  non-slip stool.  Keep the floor dry. Clean up any water that spills on the floor as soon as it happens.  Remove soap buildup in the tub or shower regularly.  Attach bath mats securely with double-sided non-slip rug tape.  Do not have throw rugs and other things on the floor that can make you trip. What can I do in the bedroom?  Use night lights.  Make sure that you have a light by your bed that is easy to reach.  Do not use any sheets or blankets that are too big for your bed. They should not hang down onto the floor.  Have a firm chair that has side arms. You can use this for support while you get dressed.  Do not have throw rugs and other things on the floor that can make you trip. What can I do in the kitchen?  Clean up any spills right away.  Avoid walking on wet floors.  Keep items that you use a lot in easy-to-reach places.  If you need to reach something above you, use a strong step stool that has a grab bar.  Keep electrical cords out of the way.  Do not use floor polish  or wax that makes floors slippery. If you must use wax, use non-skid floor wax.  Do not have throw rugs and other things on the floor that can make you trip. What can I do with my stairs?  Do not leave any items on the stairs.  Make sure that there are handrails on both sides of the stairs and use them. Fix handrails that are broken or loose. Make sure that handrails are as long as the stairways.  Check any carpeting to make sure that it is firmly attached to the stairs. Fix any carpet that is loose or worn.  Avoid having throw rugs at the top or bottom of the stairs. If you do have throw rugs, attach them to the floor with carpet tape.  Make sure that you have a light switch at the top of the stairs and the bottom of the stairs. If you do not have them, ask someone to add them for you. What else can I do to help prevent falls?  Wear shoes that:  Do not have high heels.  Have rubber  bottoms.  Are comfortable and fit you well.  Are closed at the toe. Do not wear sandals.  If you use a stepladder:  Make sure that it is fully opened. Do not climb a closed stepladder.  Make sure that both sides of the stepladder are locked into place.  Ask someone to hold it for you, if possible.  Clearly mark and make sure that you can see:  Any grab bars or handrails.  First and last steps.  Where the edge of each step is.  Use tools that help you move around (mobility aids) if they are needed. These include:  Canes.  Walkers.  Scooters.  Crutches.  Turn on the lights when you go into a dark area. Replace any light bulbs as soon as they burn out.  Set up your furniture so you have a clear path. Avoid moving your furniture around.  If any of your floors are uneven, fix them.  If there are any pets around you, be aware of where they are.  Review your medicines with your doctor. Some medicines can make you feel dizzy. This can increase your chance of falling. Ask your doctor what other things that you can do to help prevent falls. This information is not intended to replace advice given to you by your health care provider. Make sure you discuss any questions you have with your health care provider. Document Released: 07/01/2009 Document Revised: 02/10/2016 Document Reviewed: 10/09/2014 Elsevier Interactive Patient Education  2017 [employer].

## 2018-05-14 NOTE — Progress Notes (Signed)
Subjective:   Rebecca Mitchell is a 77 y.o. female who presents for Medicare Annual (Subsequent) preventive examination.  Review of Systems:   Cardiac Risk Factors include: advanced age (>92men, >54 women);hypertension;dyslipidemia     Objective:     Vitals: BP 138/68 (BP Location: Right Arm, Patient Position: Sitting)   Pulse 72   Temp 97.8 F (36.6 C) (Oral)   Resp 16   Ht 5\' 4"  (1.626 m)   Wt 134 lb 9.6 oz (61.1 kg)   BMI 23.10 kg/m   Body mass index is 23.1 kg/m.  Advanced Directives 05/14/2018 10/10/2017 08/23/2017 05/01/2017 10/04/2015 07/15/2015  Does Patient Have a Medical Advance Directive? No No No No No No  Copy of Healthcare Power of Attorney in Chart? - - - - No - copy requested -  Would patient like information on creating a medical advance directive? No - Patient declined No - Patient declined No - Patient declined Yes (MAU/Ambulatory/Procedural Areas - Information given) - No - patient declined information    Tobacco Social History   Tobacco Use  Smoking Status Never Smoker  Smokeless Tobacco Never Used     Counseling given: Not Answered   Clinical Intake:  Pre-visit preparation completed: Yes  Pain : No/denies pain     Nutritional Status: BMI of 19-24  Normal Nutritional Risks: None Diabetes: No  How often do you need to have someone help you when you read instructions, pamphlets, or other written materials from your doctor or pharmacy?: 1 - Never What is the last grade level you completed in school?: college   Interpreter Needed?: No  Information entered by :: TIffany Hill,LPN   Past Medical History:  Diagnosis Date  . Anemia   . Breast cancer ([place]) 09/2014   left breast lumpectomy with rad tx  . Hyperlipidemia   . Malignant neoplasm of lower-inner quadrant of left female breast ([place]) 07/2014   left breast  . Osteoporosis   . Personal history of radiation therapy 2016   left breast ca   Past Surgical History:  Procedure Laterality  Date  . BREAST BIOPSY Left 08/2014   invasive mammary carcinoma  . BREAST LUMPECTOMY Left 10/01/2014   invasive mammary carcinoma and DCIS with clear margins  . BREAST SURGERY Left 10/01/2014   lumpectomy  . COLONOSCOPY  2010  . COLONOSCOPY WITH PROPOFOL N/A 09/21/2015   Procedure: COLONOSCOPY WITH PROPOFOL;  Surgeon: Christene Lye, MD;  Location: [facility] ENDOSCOPY;  Service: Endoscopy;  Laterality: N/A;  . UPPER GI ENDOSCOPY  2014   Family History  Problem Relation Age of Onset  . Breast cancer Sister 13  . Heart disease Mother   . Heart disease Father    Social History   Socioeconomic History  . Marital status: Married    Spouse name: Not on file  . Number of children: Not on file  . Years of education: Not on file  . Highest education level: Bachelor's degree (e.g., BA, AB, BS)  Occupational History  . Not on file  Social Needs  . Financial resource strain: Not hard at all  . Food insecurity:    Worry: Never true    Inability: Never true  . Transportation needs:    Medical: No    Non-medical: No  Tobacco Use  . Smoking status: Never Smoker  . Smokeless tobacco: Never Used  Substance and Sexual Activity  . Alcohol use: No    Alcohol/week: 0.0 standard drinks  . Drug use: No  .  Sexual activity: Not on file  Lifestyle  . Physical activity:    Days per week: 5 days    Minutes per session: 60 min  . Stress: Not at all  Relationships  . Social connections:    Talks on phone: More than three times a week    Gets together: More than three times a week    Attends religious service: More than 4 times per year    Active member of club or organization: Yes    Attends meetings of clubs or organizations: More than 4 times per year    Relationship status: Married  Other Topics Concern  . Not on file  Social History Narrative  . Not on file    Outpatient Encounter Medications as of 05/14/2018  Medication Sig  . amLODipine (NORVASC) 5 MG tablet Take 1 tablet (5 mg  total) by mouth daily.  . Ascorbic Acid (VITAMIN C) 1000 MG tablet Take 1,000 mg by mouth daily.  Marland Kitchen aspirin EC 81 MG tablet Take 81 mg by mouth daily.  . calcium carbonate 100 mg/ml SUSP Take by mouth.  . Calcium Citrate-Vitamin D (CALCIUM CITRATE + PO) Take 1 tablet by mouth daily.  Marland Kitchen co-enzyme Q-10 30 MG capsule Take 30 mg by mouth 3 (three) times daily.  Marland Kitchen FERROUS SULFATE PO Take 1 tablet by mouth 3 (three) times a week.  . letrozole (FEMARA) 2.5 MG tablet Take 1 tablet (2.5 mg total) by mouth daily.  Marland Kitchen losartan-hydrochlorothiazide (HYZAAR) 100-25 MG tablet Take 1 tablet by mouth daily.  . Lutein-Zeaxanthin 15-0.7 MG CAPS Take 1 capsule by mouth daily.  . metoprolol tartrate (LOPRESSOR) 25 MG tablet Take 1 tablet (25 mg total) by mouth 2 (two) times daily.  . Multiple Vitamin (MULTIVITAMIN) tablet Take 1 tablet by mouth daily.  . Omega-3 Fatty Acids (FISH OIL PO) Take 1 capsule by mouth daily.  . pravastatin (PRAVACHOL) 20 MG tablet Take 1 tablet (20 mg total) by mouth daily.  . Saccharomyces boulardii (PROBIOTIC) 250 MG CAPS Take 250 mg by mouth daily.  Marland Kitchen denosumab (PROLIA) 60 MG/ML SOLN injection Inject into the skin.  . fluticasone (FLONASE) 50 MCG/ACT nasal spray USE 2 SPRAYS IN EACH NOSTRIL ONCE DAILY FOR UP TO 4 TO 6 WEEKS, THEN AS NEEDED FOR ALLERGIES (Patient not taking: Reported on 05/14/2018)  . loratadine (CLARITIN) 10 MG tablet Take 1 tablet (10 mg total) by mouth daily. For up to 1 month, then as needed for seasonal allergies. (Patient not taking: Reported on 05/14/2018)   No facility-administered encounter medications on file as of 05/14/2018.     Activities of Daily Living In your present state of health, do you have any difficulty performing the following activities: 05/14/2018  Hearing? Y  Comment some difficulty   Vision? N  Difficulty concentrating or making decisions? N  Walking or climbing stairs? N  Dressing or bathing? N  Doing errands, shopping? N  Preparing Food  and eating ? N  Using the Toilet? N  In the past six months, have you accidently leaked urine? Y  Comment wears panty liner for protection  Do you have problems with loss of bowel control? N  Managing your Medications? N  Managing your Finances? N  Housekeeping or managing your Housekeeping? N  Some recent data might be hidden    Patient Care Team: Olin Hauser, DO as PCP - General (Family Medicine) Noreene Filbert, MD as Referring Physician (Radiation Oncology) Bary Castilla Forest Gleason, MD (General Surgery) Ree Edman  M, MD (Dermatology) Gabriel Carina Betsey Holiday, MD as Physician Assistant (Endocrinology)    Assessment:   This is a routine wellness examination for [place].  Exercise Activities and Dietary recommendations Current Exercise Habits: Structured exercise class(classes at the edge ), Type of exercise: strength training/weights;stretching(cardio), Frequency (Times/Week): 7, Intensity: Mild, Exercise limited by: None identified  Goals    . DIET - INCREASE WATER INTAKE     Recommend drinking at least 6-8 glasses of water a day        Fall Risk Fall Risk  05/14/2018 12/19/2017 08/23/2017 05/01/2017 12/11/2016  Falls in the past year? No No No No No   Is the patient's home free of loose throw rugs in walkways, pet beds, electrical cords, etc?   yes      Grab bars in the bathroom? no      Handrails on the stairs?   yes      Adequate lighting?   yes  Timed Get Up and Go performed: Completed in 8 seconds with no use of assistive devices, steady gait. No intervention needed at this time.   Depression Screen PHQ 2/9 Scores 05/14/2018 12/19/2017 08/23/2017 05/01/2017  PHQ - 2 Score 0 0 0 0     Cognitive Function     6CIT Screen 05/01/2017  What Year? 0 points  What month? 0 points  What time? 0 points  Count back from 20 0 points  Months in reverse 0 points  Repeat phrase 0 points  Total Score 0    Immunization History  Administered Date(s) Administered  .  Influenza, High Dose Seasonal PF 08/15/2016, 06/13/2017  . Influenza,inj,Quad PF,6+ Mos 06/14/2015  . Pneumococcal Polysaccharide-23 08/12/2012    Qualifies for Shingles Vaccine? Yes, discussed shingrix vaccine  Screening Tests Health Maintenance  Topic Date Due  . INFLUENZA VACCINE  04/18/2018  . TETANUS/TDAP  03/25/2024  . DEXA SCAN  Completed  . PNA vac Low Risk Adult  Completed    Cancer Screenings: Lung: Low Dose CT Chest recommended if Age 46-80 years, 30 pack-year currently smoking OR have quit w/in 15years. Patient does not qualify. Breast:  Up to date on Mammogram? Yes  10/18/2017 Up to date of Bone Density/Dexa? Yes 06/12/2017 Colorectal: completed 09/21/2015  Additional Screenings: Hepatitis C Screening: not indicated     Plan:  I have personally reviewed and addressed the Medicare Annual Wellness questionnaire and have noted the following in the patient's chart:  A. Medical and social history B. Use of alcohol, tobacco or illicit drugs  C. Current medications and supplements D. Functional ability and status E.  Nutritional status F.  Physical activity G. Advance directives H. List of other physicians I.  Hospitalizations, surgeries, and ER visits in previous 12 months J.  [place] such as hearing and vision if needed, cognitive and depression L. Referrals and appointments   In addition, I have reviewed and discussed with patient certain preventive protocols, quality metrics, and best practice recommendations. A written personalized care plan for preventive services as well as general preventive health recommendations were provided to patient.   Signed,  Tyler Aas, LPN Nurse Health Advisor   Nurse Notes:none

## 2018-05-17 ENCOUNTER — Other Ambulatory Visit: Payer: Self-pay | Admitting: Family Medicine

## 2018-05-17 DIAGNOSIS — I10 Essential (primary) hypertension: Secondary | ICD-10-CM

## 2018-05-17 MED ORDER — LOSARTAN POTASSIUM-HCTZ 100-25 MG PO TABS: 1.0000 | ORAL_TABLET | Freq: Every day | ORAL | 0 refills | Status: DC

## 2018-05-17 NOTE — Telephone Encounter (Signed)
Pt called requesting refill on losartan called into [facility]  1 month mail order is on back order.

## 2018-05-19 ENCOUNTER — Other Ambulatory Visit: Payer: Self-pay | Admitting: Family Medicine

## 2018-05-19 DIAGNOSIS — I10 Essential (primary) hypertension: Secondary | ICD-10-CM

## 2018-05-23 DIAGNOSIS — H524 Presbyopia: Secondary | ICD-10-CM | POA: Diagnosis not present

## 2018-05-23 DIAGNOSIS — H43813 Vitreous degeneration, bilateral: Secondary | ICD-10-CM | POA: Diagnosis not present

## 2018-05-23 DIAGNOSIS — H5203 Hypermetropia, bilateral: Secondary | ICD-10-CM | POA: Diagnosis not present

## 2018-05-23 DIAGNOSIS — H52223 Regular astigmatism, bilateral: Secondary | ICD-10-CM | POA: Diagnosis not present

## 2018-05-23 DIAGNOSIS — H2513 Age-related nuclear cataract, bilateral: Secondary | ICD-10-CM | POA: Diagnosis not present

## 2018-05-29 ENCOUNTER — Other Ambulatory Visit: Payer: Self-pay | Admitting: Family Medicine

## 2018-05-29 DIAGNOSIS — M419 Scoliosis, unspecified: Secondary | ICD-10-CM | POA: Diagnosis not present

## 2018-05-29 DIAGNOSIS — I10 Essential (primary) hypertension: Secondary | ICD-10-CM

## 2018-06-19 ENCOUNTER — Ambulatory Visit: Payer: Medicare Other | Admitting: Family Medicine

## 2018-06-20 ENCOUNTER — Other Ambulatory Visit: Payer: Self-pay | Admitting: Family Medicine

## 2018-06-20 ENCOUNTER — Ambulatory Visit: Payer: Medicare Other | Admitting: Family Medicine

## 2018-06-20 ENCOUNTER — Encounter: Payer: Self-pay | Admitting: Family Medicine

## 2018-06-20 ENCOUNTER — Ambulatory Visit (INDEPENDENT_AMBULATORY_CARE_PROVIDER_SITE_OTHER): Payer: Medicare Other | Admitting: Family Medicine

## 2018-06-20 VITALS — BP 134/64 | HR 71 | Temp 98.4°F | Resp 16 | Ht 64.0 in | Wt 137.0 lb

## 2018-06-20 DIAGNOSIS — Z23 Encounter for immunization: Secondary | ICD-10-CM

## 2018-06-20 DIAGNOSIS — M810 Age-related osteoporosis without current pathological fracture: Secondary | ICD-10-CM

## 2018-06-20 DIAGNOSIS — R7309 Other abnormal glucose: Secondary | ICD-10-CM | POA: Diagnosis not present

## 2018-06-20 DIAGNOSIS — E782 Mixed hyperlipidemia: Secondary | ICD-10-CM

## 2018-06-20 DIAGNOSIS — H9110 Presbycusis, unspecified ear: Secondary | ICD-10-CM | POA: Diagnosis not present

## 2018-06-20 DIAGNOSIS — M41124 Adolescent idiopathic scoliosis, thoracic region: Secondary | ICD-10-CM

## 2018-06-20 DIAGNOSIS — N3000 Acute cystitis without hematuria: Secondary | ICD-10-CM | POA: Diagnosis not present

## 2018-06-20 DIAGNOSIS — D509 Iron deficiency anemia, unspecified: Secondary | ICD-10-CM

## 2018-06-20 DIAGNOSIS — I10 Essential (primary) hypertension: Secondary | ICD-10-CM

## 2018-06-20 DIAGNOSIS — Z0000 Encounter for general adult medical examination without abnormal findings: Secondary | ICD-10-CM

## 2018-06-20 LAB — POCT URINALYSIS DIPSTICK
BILIRUBIN UA: NEGATIVE
COLOR UA: NEGATIVE
GLUCOSE UA: NEGATIVE
KETONES UA: 40
Nitrite, UA: NEGATIVE
Protein, UA: NEGATIVE
RBC UA: NEGATIVE
SPEC GRAV UA: 1.01 (ref 1.010–1.025)
Urobilinogen, UA: 0.2 E.U./dL
pH, UA: 5 (ref 5.0–8.0)

## 2018-06-20 LAB — POCT GLYCOSYLATED HEMOGLOBIN (HGB A1C): HEMOGLOBIN A1C: 5.7 % — AB (ref 4.0–5.6)

## 2018-06-20 MED ORDER — CEPHALEXIN 500 MG PO CAPS: 500.0000 mg | ORAL_CAPSULE | Freq: Three times a day (TID) | ORAL | 0 refills | Status: DC

## 2018-06-20 NOTE — Assessment & Plan Note (Signed)
Stable to improved A1c 5.7, well controlled now on improved lifestyle Healthy lifestyle. No other significant risk factors  Plan:  1. Not on any therapy currently  2. Encourage improved lifestyle - reviewed basics of low carb, low sugar diet, continue improving regular exercise 3. Follow-up 6 months repeat A1c at annual phys - then likely q 6-12 mo

## 2018-06-20 NOTE — Patient Instructions (Addendum)
Thank you for coming to the office today.  I do not see any reason for hearing loss - let me know if you need a referral to Audiology for testing, likely normal gradual hearing loss. This may take a long time to change. Notify us if worsens quicker.  A1c 5.7 - improved from 5.8, keep up good work  High Dose flu shot today  1. You have a Urinary Tract Infection - this is very common, your symptoms are reassuring and you should get better within 1 week on the antibiotics - Start Keflex 500mg  3 times daily for next 7 days, complete entire course, even if feeling better - We sent urine for a culture, we will call you within next few days if we need to change antibiotics - Please drink plenty of fluids, improve hydration over next 1 week  If symptoms worsening, developing nausea / vomiting, worsening back pain, fevers / chills / sweats, then please return for re-evaluation sooner.  DUE for FASTING BLOOD WORK (no food or drink after midnight before the lab appointment, only water or coffee without cream/sugar on the morning of)  SCHEDULE "Lab Only" visit in the morning at the clinic for lab draw in 6 MONTHS   - Make sure Lab Only appointment is at about 1 week before your next appointment, so that results will be available  For Lab Results, once available within 2-3 days of blood draw, you can can log in to MyChart online to view your results and a brief explanation. Also, we can discuss results at next follow-up visit.   Please schedule a Follow-up Appointment to: Return in about 6 months (around 12/20/2018) for Annual Physical.  If you have any other questions or concerns, please feel free to call the office or send a message through [place]. You may also schedule an earlier appointment if necessary.  Additionally, you may be receiving a survey about your experience at our office within a few days to 1 week by e-mail or mail. We value your feedback.  Nobie Putnam, DO [place]

## 2018-06-20 NOTE — Progress Notes (Signed)
Subjective:    Patient ID: Rebecca Mitchell, female    DOB: [birth year], 77 y.o.   MRN: [id number]  Rebecca Mitchell is a 77 y.o. female presenting on 06/20/2018 for Urinary Tract Infection and Pre-Diabetes   HPI   Elevated A1c Previous reading A1c 5.8. Today A1c 5.7 CBGs: Not checking, not indicated Meds: Never on meds Currently on ARB Lifestyle: - Diet (Improving her balanced diet, tries to eat low carb, mostly water, very rarely tea / soda even less now) - Exercise (6 days a week, exercise classes and upper/lower body) Denies hypoglycemia  UTI / Urinary Urgency Discomfort Reports past 2-3 days recently has had symptoms of increased urinary urgency at times, similar to prior UTI. She has been drinking more water without significant improvement. Also admits some lower pelvic bladder pressure by her description and and rare dysuria. - Similar symptoms 1 year ago 06/2017, had Strep UTI treated with Keflex and resolved. - Denies any fevers, chills, hematuria, urinary odor or color change, flank or new back pain, abdominal pain  Osteoporosis Followed by [facility], Dr Gabriel Carina Has had Prolia injection x 2 thus far, and she will follow-up within next 3-4 months   Reduced Hearing Reports additional complaint, gradual reduced hearing 1 month ago, unsure which ear, she thinks at times her hearing is reduced. She states does better if looking directly at person, otherwise does not have history of hearing aids or other ear problems. Admits very rare tinnitus. Also some ear itching rarely Denies ear pain or pressure  Health Maintenance: Due for High Dose Flu Shot, will receive today    Depression screen [facility] 2/9 06/20/2018 05/14/2018 12/19/2017  Decreased Interest 0 0 0  Down, Depressed, Hopeless 0 0 0  PHQ - 2 Score 0 0 0    Social History   Tobacco Use  . Smoking status: Never Smoker  . Smokeless tobacco: Never Used  Substance Use Topics  . Alcohol use: No    Alcohol/week: 0.0  standard drinks  . Drug use: No    Review of Systems Per HPI unless specifically indicated above     Objective:    BP 134/64 (BP Location: Left Arm, Cuff Size: Normal)   Pulse 71   Temp 98.4 F (36.9 C) (Oral)   Resp 16   Ht 5\' 4"  (1.626 m)   Wt 137 lb (62.1 kg)   BMI 23.52 kg/m   Wt Readings from Last 3 Encounters:  06/20/18 137 lb (62.1 kg)  05/14/18 134 lb 9.6 oz (61.1 kg)  12/19/17 135 lb (61.2 kg)    Physical Exam  Constitutional: She is oriented to person, place, and time. She appears well-developed and well-nourished. No distress.  Well-appearing, comfortable, cooperative, thin  HENT:  Head: Normocephalic and atraumatic.  Mouth/Throat: Oropharynx is clear and moist.  Frontal / maxillary sinuses non-tender. Nares patent without purulence or edema. Bilateral TMs clear without erythema, effusion or bulging - L without cerumen, R with mild cerumen debris not impacted, question if some ear canal eczema or flaking skin. Oropharynx clear without erythema, exudates, edema or asymmetry.  Eyes: Conjunctivae are normal. Right eye exhibits no discharge. Left eye exhibits no discharge.  Cardiovascular: Normal rate, regular rhythm, normal heart sounds and intact distal pulses.  No murmur heard. Pulmonary/Chest: Effort normal and breath sounds normal. No respiratory distress. She has no wheezes. She has no rales.  Abdominal: Soft. Bowel sounds are normal. She exhibits no distension. There is no tenderness.  Musculoskeletal: Normal range  of motion. She exhibits no edema.  Neurological: She is alert and oriented to person, place, and time.  Skin: Skin is warm and dry. No rash noted. She is not diaphoretic. No erythema.  Psychiatric: She has a normal mood and affect. Her behavior is normal.  Well groomed, good eye contact, normal speech and thoughts  Nursing note and vitals reviewed.    Recent Labs    12/13/17 0756 06/20/18 0831  HGBA1C 5.8* 5.7*    Results for orders placed  or performed in visit on 06/20/18  POCT HgB A1C  Result Value Ref Range   Hemoglobin A1C 5.7 (A) 4.0 - 5.6 %  POCT urinalysis dipstick  Result Value Ref Range   Color, UA negative    Clarity, UA clear    Glucose, UA Negative Negative   Bilirubin, UA negative    Ketones, UA 40    Spec Grav, UA 1.010 1.010 - 1.025   Blood, UA negative    pH, UA 5.0 5.0 - 8.0   Protein, UA Negative Negative   Urobilinogen, UA 0.2 0.2 or 1.0 E.U./dL   Nitrite, UA negative    Leukocytes, UA Moderate (2+) (A) Negative   Appearance clear    Odor none       Assessment & Plan:   Problem List Items Addressed This Visit    Elevated hemoglobin A1c - Primary    Stable to improved A1c 5.7, well controlled now on improved lifestyle Healthy lifestyle. No other significant risk factors  Plan:  1. Not on any therapy currently  2. Encourage improved lifestyle - reviewed basics of low carb, low sugar diet, continue improving regular exercise 3. Follow-up 6 months repeat A1c at annual phys - then likely q 6-12 mo      Relevant Orders   POCT HgB A1C (Completed)    Other Visit Diagnoses    Acute cystitis without hematuria      Clinically consistent with UTI and confirmed on UA. No recent UTIs or abx courses, last UTI 1 year ago, resolved on keflex had strep culture.  No concern for pyelo today (no systemic symptoms, neg fever, back pain, n/v).  Plan: 1. Ordered Urine culture 2. Empiric Keflex 500mg  TID x 7 days 3. Improve PO hydration RTC if no improvement 1-2 weeks, red flags given to return sooner    Relevant Medications   cephALEXin (KEFLEX) 500 MG capsule   Other Relevant Orders   Urine Culture   POCT urinalysis dipstick (Completed)   Needs flu shot       Relevant Orders   Flu vaccine HIGH DOSE PF (Completed)      Presbycusis Likely secondary to age related chronic gradual hearing loss No abnormality on exam or history to suggest other etiology at this time Recommend observation, future  audiology testing if interested in hearing aid  OP Continue f/u to [facility], on Prolia   Meds ordered this encounter  Medications  . cephALEXin (KEFLEX) 500 MG capsule    Sig: Take 1 capsule (500 mg total) by mouth 3 (three) times daily. For 7 days    Dispense:  21 capsule    Refill:  0    Follow up plan: Return in about 6 months (around 12/20/2018) for Annual Physical.  Future labs ordered for 12/16/18  Nobie Putnam, [facility] 06/20/2018, 9:48 PM

## 2018-06-23 LAB — URINE CULTURE
MICRO NUMBER: 91190983
SPECIMEN QUALITY:: ADEQUATE

## 2018-08-22 ENCOUNTER — Ambulatory Visit: Payer: Medicare Other | Admitting: Radiation Oncology

## 2018-08-26 ENCOUNTER — Ambulatory Visit
Admission: RE | Admit: 2018-08-26 | Discharge: 2018-08-26 | Disposition: A | Discharge status: Home / Self Care | Payer: Medicare Other | Source: Ambulatory Visit | Attending: Radiation Oncology | Admitting: Radiation Oncology

## 2018-08-26 ENCOUNTER — Encounter: Payer: Self-pay | Admitting: Radiation Oncology

## 2018-08-26 ENCOUNTER — Other Ambulatory Visit: Payer: Self-pay

## 2018-08-26 VITALS — BP 126/73 | HR 68 | Temp 97.0°F | Resp 18 | Wt 140.5 lb

## 2018-08-26 DIAGNOSIS — Z170 Estrogen receptor positive status [ER+]: Secondary | ICD-10-CM | POA: Insufficient documentation

## 2018-08-26 DIAGNOSIS — C50312 Malignant neoplasm of lower-inner quadrant of left female breast: Secondary | ICD-10-CM | POA: Insufficient documentation

## 2018-08-26 DIAGNOSIS — Z79811 Long term (current) use of aromatase inhibitors: Secondary | ICD-10-CM | POA: Insufficient documentation

## 2018-08-26 DIAGNOSIS — Z923 Personal history of irradiation: Secondary | ICD-10-CM | POA: Insufficient documentation

## 2018-08-26 NOTE — Progress Notes (Signed)
Radiation Oncology Follow up Note  Name: Rebecca Mitchell   Date:   08/26/2018 MRN:  [id number] DOB: 1940-09-29    This 77 y.o. female presents to the clinic today for 3-1/2 year follow-up status post whole breast radiation to her left breast for stage I ER/PR positive invasive mammary carcinoma.  REFERRING PROVIDER: Nobie Putnam *  HPI: patient is a 77 year old female now seen out 3 and half years having completed whole breast radiation to her left breast for stage I ER/PR positive invasive mammary carcinoma. Seen today in routine follow-up she is doing well. She specifically denies breast tendernesscough or bone pain..she's not had mammogram in about a year scheduled for another mammogram in January.she's currently on Femara tolerating that well without side effect.  COMPLICATIONS OF TREATMENT: none  FOLLOW UP COMPLIANCE: keeps appointments   PHYSICAL EXAM:  BP 126/73 (BP Location: Left Arm, Patient Position: Sitting)   Pulse 68   Temp (!) 97 F (36.1 C) (Tympanic)   Resp 18   Wt 140 lb 8.7 oz (63.7 kg)   BMI 24.12 kg/m  Lungs are clear to A&P cardiac examination essentially unremarkable with regular rate and rhythm. No dominant mass or nodularity is noted in either breast in 2 positions examined. Incision is well-healed. No axillary or supraclavicular adenopathy is appreciated. Cosmetic result is excellent.Well-developed well-nourished patient in NAD. HEENT reveals PERLA, EOMI, discs not visualized.  Oral cavity is clear. No oral mucosal lesions are identified. Neck is clear without evidence of cervical or supraclavicular adenopathy. Lungs are clear to A&P. Cardiac examination is essentially unremarkable with regular rate and rhythm without murmur rub or thrill. Abdomen is benign with no organomegaly or masses noted. Motor sensory and DTR levels are equal and symmetric in the upper and lower extremities. Cranial nerves II through XII are grossly intact. Proprioception is intact. No  peripheral adenopathy or edema is identified. No motor or sensory levels are noted. Crude visual fields are within normal range.  RADIOLOGY RESULTS: no current films reviewed  PLAN: present time patient is doing well with no evidence of disease. I've asked to see her back in 1 year for follow-up. Patient knows to call with any concerns at any time. She continues on Femara without side effect.  I would like to take this opportunity to thank you for allowing me to participate in the care of your patient.Noreene Filbert, MD

## 2018-09-17 ENCOUNTER — Other Ambulatory Visit: Payer: Self-pay

## 2018-09-17 DIAGNOSIS — C50312 Malignant neoplasm of lower-inner quadrant of left female breast: Secondary | ICD-10-CM

## 2018-09-17 DIAGNOSIS — Z170 Estrogen receptor positive status [ER+]: Principal | ICD-10-CM

## 2018-10-02 DIAGNOSIS — M810 Age-related osteoporosis without current pathological fracture: Secondary | ICD-10-CM | POA: Diagnosis not present

## 2018-10-09 DIAGNOSIS — M810 Age-related osteoporosis without current pathological fracture: Secondary | ICD-10-CM | POA: Diagnosis not present

## 2018-10-21 ENCOUNTER — Ambulatory Visit
Admission: RE | Admit: 2018-10-21 | Discharge: 2018-10-21 | Disposition: A | Discharge status: Home / Self Care | Payer: Medicare Other | Source: Ambulatory Visit | Attending: Surgery | Admitting: Surgery

## 2018-10-21 DIAGNOSIS — Z170 Estrogen receptor positive status [ER+]: Principal | ICD-10-CM

## 2018-10-21 DIAGNOSIS — C50312 Malignant neoplasm of lower-inner quadrant of left female breast: Secondary | ICD-10-CM

## 2018-10-21 DIAGNOSIS — R922 Inconclusive mammogram: Secondary | ICD-10-CM | POA: Diagnosis not present

## 2018-10-28 ENCOUNTER — Other Ambulatory Visit: Payer: Self-pay | Admitting: Family Medicine

## 2018-10-28 DIAGNOSIS — I10 Essential (primary) hypertension: Secondary | ICD-10-CM

## 2018-10-29 ENCOUNTER — Other Ambulatory Visit: Payer: Self-pay | Admitting: General Surgery

## 2018-10-29 ENCOUNTER — Other Ambulatory Visit: Payer: Self-pay | Admitting: Family Medicine

## 2018-10-29 DIAGNOSIS — E785 Hyperlipidemia, unspecified: Secondary | ICD-10-CM

## 2018-10-31 ENCOUNTER — Ambulatory Visit: Payer: Medicare Other | Admitting: General Surgery

## 2018-10-31 ENCOUNTER — Encounter: Payer: Self-pay | Admitting: General Surgery

## 2018-10-31 ENCOUNTER — Other Ambulatory Visit: Payer: Self-pay

## 2018-10-31 VITALS — BP 133/79 | HR 71 | Temp 97.9°F | Resp 14 | Ht 64.0 in | Wt 139.0 lb

## 2018-10-31 DIAGNOSIS — C50312 Malignant neoplasm of lower-inner quadrant of left female breast: Secondary | ICD-10-CM

## 2018-10-31 DIAGNOSIS — Z170 Estrogen receptor positive status [ER+]: Secondary | ICD-10-CM | POA: Diagnosis not present

## 2018-10-31 NOTE — Progress Notes (Signed)
Patient ID: Rebecca Mitchell, female   DOB: [birth year], 78 y.o.   MRN: [id number]  Chief Complaint  Patient presents with  . Follow-up    f/u recall bil diag mammo [facility] 10/21/18,    HPI Rebecca Mitchell is a 78 y.o. female.  who presents for her follow up left breast cancer and a breast evaluation. The most recent mammogram was done on 10-21-18.  Patient does perform regular self breast checks and gets regular mammograms done.     HPI  Past Medical History:  Diagnosis Date  . Anemia   . Breast cancer ([place]) 09/2014   left breast lumpectomy with rad tx  . Hyperlipidemia   . Malignant neoplasm of lower-inner quadrant of left female breast ([place]) 07/2014   5 mm,T1a, N0; ER/ PR positive, HER-2/neu negative  . Osteoporosis   . Personal history of radiation therapy 2016   left breast ca    Past Surgical History:  Procedure Laterality Date  . BREAST BIOPSY Left 08/2014   invasive mammary carcinoma  . BREAST LUMPECTOMY Left 10/01/2014   invasive mammary carcinoma and DCIS with clear margins  . BREAST SURGERY Left 10/01/2014   lumpectomy  . COLONOSCOPY  2010  . COLONOSCOPY WITH PROPOFOL N/A 09/21/2015   Procedure: COLONOSCOPY WITH PROPOFOL;  Surgeon: Christene Lye, MD;  Location: [facility] ENDOSCOPY;  Service: Endoscopy;  Laterality: N/A;  . UPPER GI ENDOSCOPY  2014    Family History  Problem Relation Age of Onset  . Breast cancer Sister 13  . Heart disease Mother   . Heart disease Father     Social History Social History   Tobacco Use  . Smoking status: Never Smoker  . Smokeless tobacco: Never Used  Substance Use Topics  . Alcohol use: No    Alcohol/week: 0.0 standard drinks  . Drug use: No    No Known Allergies  Current Outpatient Medications  Medication Sig Dispense Refill  . amLODipine (NORVASC) 5 MG tablet TAKE 1 TABLET BY MOUTH  DAILY 90 tablet 3  . Ascorbic Acid (VITAMIN C) 1000 MG tablet Take 1,000 mg by mouth daily.    Marland Kitchen aspirin EC 81 MG tablet Take 81 mg by mouth  daily.    . calcium carbonate 100 mg/ml SUSP Take by mouth.    . Calcium Citrate-Vitamin D (CALCIUM CITRATE + PO) Take 1 tablet by mouth daily.    . cephALEXin (KEFLEX) 500 MG capsule Take 1 capsule (500 mg total) by mouth 3 (three) times daily. For 7 days 21 capsule 0  . co-enzyme Q-10 30 MG capsule Take 30 mg by mouth 3 (three) times daily.    Marland Kitchen denosumab (PROLIA) 60 MG/ML SOLN injection Inject into the skin.    Marland Kitchen FERROUS SULFATE PO Take 1 tablet by mouth 3 (three) times a week.    . fluticasone (FLONASE) 50 MCG/ACT nasal spray USE 2 SPRAYS IN EACH NOSTRIL ONCE DAILY FOR UP TO 4 TO 6 WEEKS, THEN AS NEEDED FOR ALLERGIES 16 g 5  . letrozole (FEMARA) 2.5 MG tablet TAKE 1 TABLET BY MOUTH  DAILY 90 tablet 3  . loratadine (CLARITIN) 10 MG tablet Take 1 tablet (10 mg total) by mouth daily. For up to 1 month, then as needed for seasonal allergies. 30 tablet 3  . losartan-hydrochlorothiazide (HYZAAR) 100-25 MG tablet TAKE 1 TABLET BY MOUTH DAILY 90 tablet 3  . Lutein-Zeaxanthin 15-0.7 MG CAPS Take 1 capsule by mouth daily.    . metoprolol tartrate (LOPRESSOR) 25 MG  tablet TAKE 1 TABLET BY MOUTH TWO  TIMES DAILY 180 tablet 3  . Multiple Vitamin (MULTIVITAMIN) tablet Take 1 tablet by mouth daily.    . Omega-3 Fatty Acids (FISH OIL PO) Take 1 capsule by mouth daily.    . pravastatin (PRAVACHOL) 20 MG tablet TAKE 1 TABLET BY MOUTH  DAILY 90 tablet 3  . Saccharomyces boulardii (PROBIOTIC) 250 MG CAPS Take 250 mg by mouth daily.     No current facility-administered medications for this visit.     Review of Systems Review of Systems  Constitutional: Negative.   Respiratory: Negative.   Cardiovascular: Negative.     Blood pressure 133/79, pulse 71, temperature 97.9 F (36.6 C), temperature source Skin, resp. rate 14, height '5\' 4"'  (1.626 m), weight 139 lb (63 kg), SpO2 98 %.  Physical Exam Physical Exam Exam conducted with a chaperone present.  Constitutional:      Appearance: She is  well-developed.  Eyes:     General: No scleral icterus.    Conjunctiva/sclera: Conjunctivae normal.  Neck:     Musculoskeletal: Neck supple.  Cardiovascular:     Rate and Rhythm: Normal rate and regular rhythm.     Heart sounds: Normal heart sounds.  Pulmonary:     Effort: Pulmonary effort is normal.     Breath sounds: Normal breath sounds.  Chest:     Breasts:        Right: No inverted nipple, mass, nipple discharge, skin change or tenderness.        Left: No inverted nipple, mass, nipple discharge, skin change or tenderness.    Lymphadenopathy:     Cervical: No cervical adenopathy.  Skin:    General: Skin is warm and dry.  Neurological:     Mental Status: She is alert and oriented to person, place, and time.     Data Reviewed Bilateral diagnostic mammograms dated October 21, 2018 reviewed.  BI-RADS-2.  Assessment    No evidence of recurrent cancer. Doing well now 5 years status post management of a T1 a ER/PR positive tumor.    Plan  The patient has been asked to return to the office in one year with a bilateral diagnostic mammogram.The patient is aware to call back for any questions or concerns.   HPI, assessment, plan and physical exam has been scribed under the direction and in the presence of Robert Bellow, MD. Karie Fetch, RN  I have completed the exam and reviewed the above documentation for accuracy and completeness.  I agree with the above.  Haematologist has been used and any errors in dictation or transcription are unintentional.  Hervey Ard, M.D., F.A.C.S.   Forest Gleason Renee Beale 11/01/2018, 6:25 AM

## 2018-10-31 NOTE — Patient Instructions (Signed)
The patient has been asked to return to the office in one year with a bilateral diagnostic mammogram.The patient is aware to call back for any questions or concerns. 

## 2018-11-01 ENCOUNTER — Encounter: Payer: Self-pay | Admitting: General Surgery

## 2018-11-18 DIAGNOSIS — M810 Age-related osteoporosis without current pathological fracture: Secondary | ICD-10-CM | POA: Diagnosis not present

## 2018-12-16 ENCOUNTER — Other Ambulatory Visit: Payer: Medicare Other

## 2018-12-23 ENCOUNTER — Encounter: Payer: Medicare Other | Admitting: Family Medicine

## 2019-03-04 DIAGNOSIS — L578 Other skin changes due to chronic exposure to nonionizing radiation: Secondary | ICD-10-CM | POA: Diagnosis not present

## 2019-03-04 DIAGNOSIS — L570 Actinic keratosis: Secondary | ICD-10-CM | POA: Diagnosis not present

## 2019-03-04 DIAGNOSIS — Z872 Personal history of diseases of the skin and subcutaneous tissue: Secondary | ICD-10-CM | POA: Diagnosis not present

## 2019-03-04 DIAGNOSIS — Z859 Personal history of malignant neoplasm, unspecified: Secondary | ICD-10-CM | POA: Diagnosis not present

## 2019-04-15 ENCOUNTER — Encounter: Payer: Self-pay | Admitting: General Surgery

## 2019-05-08 ENCOUNTER — Other Ambulatory Visit: Payer: Self-pay

## 2019-05-08 DIAGNOSIS — E785 Hyperlipidemia, unspecified: Secondary | ICD-10-CM

## 2019-05-08 DIAGNOSIS — E559 Vitamin D deficiency, unspecified: Secondary | ICD-10-CM

## 2019-05-08 DIAGNOSIS — I10 Essential (primary) hypertension: Secondary | ICD-10-CM

## 2019-05-08 DIAGNOSIS — M810 Age-related osteoporosis without current pathological fracture: Secondary | ICD-10-CM

## 2019-05-08 DIAGNOSIS — R7303 Prediabetes: Secondary | ICD-10-CM

## 2019-05-08 DIAGNOSIS — R7309 Other abnormal glucose: Secondary | ICD-10-CM

## 2019-05-08 DIAGNOSIS — D509 Iron deficiency anemia, unspecified: Secondary | ICD-10-CM

## 2019-05-09 ENCOUNTER — Encounter: Payer: Self-pay | Admitting: Family Medicine

## 2019-05-09 ENCOUNTER — Ambulatory Visit (INDEPENDENT_AMBULATORY_CARE_PROVIDER_SITE_OTHER): Payer: Medicare Other | Admitting: Family Medicine

## 2019-05-09 ENCOUNTER — Other Ambulatory Visit: Payer: Self-pay

## 2019-05-09 ENCOUNTER — Other Ambulatory Visit: Payer: Medicare Other

## 2019-05-09 DIAGNOSIS — R7303 Prediabetes: Secondary | ICD-10-CM | POA: Diagnosis not present

## 2019-05-09 DIAGNOSIS — E785 Hyperlipidemia, unspecified: Secondary | ICD-10-CM | POA: Diagnosis not present

## 2019-05-09 DIAGNOSIS — I10 Essential (primary) hypertension: Secondary | ICD-10-CM | POA: Diagnosis not present

## 2019-05-09 DIAGNOSIS — R300 Dysuria: Secondary | ICD-10-CM

## 2019-05-09 DIAGNOSIS — D509 Iron deficiency anemia, unspecified: Secondary | ICD-10-CM | POA: Diagnosis not present

## 2019-05-09 DIAGNOSIS — M810 Age-related osteoporosis without current pathological fracture: Secondary | ICD-10-CM | POA: Diagnosis not present

## 2019-05-09 LAB — POCT URINALYSIS DIPSTICK
Bilirubin, UA: NEGATIVE
Blood, UA: NEGATIVE
Glucose, UA: NEGATIVE
Ketones, UA: NEGATIVE
Leukocytes, UA: NEGATIVE
Nitrite, UA: NEGATIVE
Protein, UA: NEGATIVE
Spec Grav, UA: 1.01 (ref 1.010–1.025)
Urobilinogen, UA: 0.2 E.U./dL
pH, UA: 5 (ref 5.0–8.0)

## 2019-05-09 NOTE — Progress Notes (Signed)
Virtual Visit via Telephone The purpose of this virtual visit is to provide medical care while limiting exposure to the novel coronavirus (COVID19) for both patient and office staff.  Consent was obtained for phone visit:  Yes.   Answered questions that patient had about telehealth interaction:  Yes.   I discussed the limitations, risks, security and privacy concerns of performing an evaluation and management service by telephone. I also discussed with the patient that there may be a patient responsible charge related to this service. The patient expressed understanding and agreed to proceed.  Patient Location: Home Provider Location: [facility])  ---------------------------------------------------------------------- Chief Complaint  Patient presents with  . Urinary Tract Infection    pressure, can't void much, burning onset yesterday btw pt was taking ABX for root canal    S: Reviewed CMA documentation. I have called patient and gathered additional HPI as follows:  UTI / Urinary Urgency Dysuria Reports symptoms started last night with some early UTI symptoms bladder pressure difficulty voiding well and had some burning thought it was UTI, she was recently already on Augmentin for 10 days previously, for abscess tooth and root canal, last dose was day or so ago, and symptoms started. Previously last UTI 06/2018, had UTI with enterobacter resolved with keflex. - Today her symptoms have improved but not resolved - She was due for blood work today and requested to drop off urine sample and be seen to check for UTI - Denies any fevers, chills, hematuria, urinary odor or color change, flank or new back pain, abdominal pain  Denies any high risk travel to areas of current concern for COVID19. Denies any known or suspected exposure to person with or possibly with COVID19.  Denies any fevers, chills, sweats, body ache, cough, shortness of breath, sinus pain or pressure,  headache, abdominal pain, diarrhea  Past Medical History:  Diagnosis Date  . Anemia   . Breast cancer ([place]) 09/2014   left breast lumpectomy with rad tx  . Hyperlipidemia   . Malignant neoplasm of lower-inner quadrant of left female breast ([place]) 07/2014   5 mm,T1a, N0; ER/ PR positive, HER-2/neu negative  . Osteoporosis   . Personal history of radiation therapy 2016   left breast ca   Social History   Tobacco Use  . Smoking status: Never Smoker  . Smokeless tobacco: Never Used  Substance Use Topics  . Alcohol use: No    Alcohol/week: 0.0 standard drinks  . Drug use: No    Current Outpatient Medications:  .  amLODipine (NORVASC) 5 MG tablet, TAKE 1 TABLET BY MOUTH  DAILY, Disp: 90 tablet, Rfl: 3 .  Ascorbic Acid (VITAMIN C) 1000 MG tablet, Take 1,000 mg by mouth daily., Disp: , Rfl:  .  aspirin EC 81 MG tablet, Take 81 mg by mouth daily., Disp: , Rfl:  .  calcium carbonate 100 mg/ml SUSP, Take by mouth., Disp: , Rfl:  .  Calcium Citrate-Vitamin D (CALCIUM CITRATE + PO), Take 1 tablet by mouth daily., Disp: , Rfl:  .  co-enzyme Q-10 30 MG capsule, Take 30 mg by mouth 3 (three) times daily., Disp: , Rfl:  .  denosumab (PROLIA) 60 MG/ML SOLN injection, Inject into the skin. , Disp: , Rfl:  .  FERROUS SULFATE PO, Take 1 tablet by mouth 3 (three) times a week., Disp: , Rfl:  .  fluticasone (FLONASE) 50 MCG/ACT nasal spray, USE 2 SPRAYS IN EACH NOSTRIL ONCE DAILY FOR UP TO 4 TO 6  WEEKS, THEN AS NEEDED FOR ALLERGIES, Disp: 16 g, Rfl: 5 .  letrozole (FEMARA) 2.5 MG tablet, TAKE 1 TABLET BY MOUTH  DAILY, Disp: 90 tablet, Rfl: 3 .  loratadine (CLARITIN) 10 MG tablet, Take 1 tablet (10 mg total) by mouth daily. For up to 1 month, then as needed for seasonal allergies., Disp: 30 tablet, Rfl: 3 .  losartan-hydrochlorothiazide (HYZAAR) 100-25 MG tablet, TAKE 1 TABLET BY MOUTH DAILY, Disp: 90 tablet, Rfl: 3 .  Lutein-Zeaxanthin 15-0.7 MG CAPS, Take 1 capsule by mouth daily., Disp: , Rfl:  .   metoprolol tartrate (LOPRESSOR) 25 MG tablet, TAKE 1 TABLET BY MOUTH TWO  TIMES DAILY, Disp: 180 tablet, Rfl: 3 .  Multiple Vitamin (MULTIVITAMIN) tablet, Take 1 tablet by mouth daily., Disp: , Rfl:  .  Omega-3 Fatty Acids (FISH OIL PO), Take 1 capsule by mouth daily., Disp: , Rfl:  .  pravastatin (PRAVACHOL) 20 MG tablet, TAKE 1 TABLET BY MOUTH  DAILY, Disp: 90 tablet, Rfl: 3 .  Saccharomyces boulardii (PROBIOTIC) 250 MG CAPS, Take 250 mg by mouth daily., Disp: , Rfl:  .  amoxicillin-clavulanate (AUGMENTIN) 875-125 MG tablet, , Disp: , Rfl:  .  cephALEXin (KEFLEX) 500 MG capsule, Take 1 capsule (500 mg total) by mouth 3 (three) times daily. For 7 days (Patient not taking: Reported on 05/09/2019), Disp: 21 capsule, Rfl: 0  Depression screen [facility] 2/9 05/09/2019 06/20/2018 05/14/2018  Decreased Interest 0 0 0  Down, Depressed, Hopeless 0 0 0  PHQ - 2 Score 0 0 0    No flowsheet data found.  -------------------------------------------------------------------------- O: No physical exam performed due to remote telephone encounter.  Lab results reviewed.  Recent Results (from the past 2160 hour(s))  POCT urinalysis dipstick     Status: Normal   Collection Time: 05/09/19  8:57 AM  Result Value Ref Range   Color, UA amber    Clarity, UA clear    Glucose, UA Negative Negative   Bilirubin, UA negative    Ketones, UA negative    Spec Grav, UA 1.010 1.010 - 1.025   Blood, UA negative    pH, UA 5.0 5.0 - 8.0   Protein, UA Negative Negative   Urobilinogen, UA 0.2 0.2 or 1.0 E.U./dL   Nitrite, UA negative    Leukocytes, UA Negative Negative   Appearance clear    Odor      -------------------------------------------------------------------------- A&P:  Problem List Items Addressed This Visit    None    Visit Diagnoses    Dysuria    -  Primary   Relevant Orders   Urine Culture   POCT urinalysis dipstick (Completed)     Uncertain etiology of dysuria / pelvic pressure vs classic UTI  symptoms recent acute onset, mild now improving. Already on Augmentin x 10 days for dental issue  Check UA today - negative dipstick Will send for culture, keeping in mind recent Augmentin Reassurance, advised her to closely monitor symptoms, improve hydration, may take AZO 1-3 days max - caution given, cranberry juice Notify us sooner if develop worsening symptoms or new concerns, seek care over weekend if needed We will f/u with her on culture result - consider other non PCN antibiotic such as macrobid vs cipro next week if need Otherwise may be self limited symptoms  No orders of the defined types were placed in this encounter.   Follow-up: - Return in 1 week PRN  Patient verbalizes understanding with the above medical recommendations including the limitation of remote  medical advice.  Specific follow-up and call-back criteria were given for patient to follow-up or seek medical care more urgently if needed.   - Time spent in direct consultation with patient on phone: 7 minutes  Nobie Putnam, [facility] 05/09/2019, 8:52 AM

## 2019-05-10 LAB — HEMOGLOBIN A1C
Hgb A1c MFr Bld: 5.9 % of total Hgb — ABNORMAL HIGH (ref <5.7)
Mean Plasma Glucose: 123 (calc)
eAG (mmol/L): 6.8 (calc)

## 2019-05-10 LAB — URINE CULTURE
MICRO NUMBER:: 798905
SPECIMEN QUALITY:: ADEQUATE

## 2019-05-10 LAB — COMPREHENSIVE METABOLIC PANEL
AG Ratio: 1.7 (calc) (ref 1.0–2.5)
ALT: 16 U/L (ref 6–29)
AST: 17 U/L (ref 10–35)
Albumin: 4.1 g/dL (ref 3.6–5.1)
Alkaline phosphatase (APISO): 41 U/L (ref 37–153)
BUN: 19 mg/dL (ref 7–25)
CO2: 26 mmol/L (ref 20–32)
Calcium: 9.8 mg/dL (ref 8.6–10.4)
Chloride: 102 mmol/L (ref 98–110)
Creat: 0.66 mg/dL (ref 0.60–0.93)
Globulin: 2.4 g/dL (calc) (ref 1.9–3.7)
Glucose, Bld: 110 mg/dL — ABNORMAL HIGH (ref 65–99)
Potassium: 4 mmol/L (ref 3.5–5.3)
Sodium: 138 mmol/L (ref 135–146)
Total Bilirubin: 0.6 mg/dL (ref 0.2–1.2)
Total Protein: 6.5 g/dL (ref 6.1–8.1)

## 2019-05-10 LAB — CBC WITH DIFFERENTIAL/PLATELET
Absolute Monocytes: 624 cells/uL (ref 200–950)
Basophils Absolute: 42 cells/uL (ref 0–200)
Basophils Relative: 0.8 %
Eosinophils Absolute: 192 cells/uL (ref 15–500)
Eosinophils Relative: 3.7 %
HCT: 37.6 % (ref 35.0–45.0)
Hemoglobin: 12.5 g/dL (ref 11.7–15.5)
Lymphs Abs: 1305 cells/uL (ref 850–3900)
MCH: 32.3 pg (ref 27.0–33.0)
MCHC: 33.2 g/dL (ref 32.0–36.0)
MCV: 97.2 fL (ref 80.0–100.0)
MPV: 11.3 fL (ref 7.5–12.5)
Monocytes Relative: 12 %
Neutro Abs: 3037 cells/uL (ref 1500–7800)
Neutrophils Relative %: 58.4 %
Platelets: 208 10*3/uL (ref 140–400)
RBC: 3.87 10*6/uL (ref 3.80–5.10)
RDW: 11.9 % (ref 11.0–15.0)
Total Lymphocyte: 25.1 %
WBC: 5.2 10*3/uL (ref 3.8–10.8)

## 2019-05-10 LAB — LIPID PANEL
Cholesterol: 167 mg/dL (ref <200)
HDL: 57 mg/dL (ref >=50)
LDL Cholesterol (Calc): 93 mg/dL (calc)
Non-HDL Cholesterol (Calc): 110 mg/dL (calc) (ref <130)
Total CHOL/HDL Ratio: 2.9 (calc) (ref <5.0)
Triglycerides: 82 mg/dL (ref <150)

## 2019-05-10 LAB — VITAMIN D 25 HYDROXY (VIT D DEFICIENCY, FRACTURES): Vit D, 25-Hydroxy: 37 ng/mL (ref 30–100)

## 2019-05-16 ENCOUNTER — Encounter: Payer: Self-pay | Admitting: Family Medicine

## 2019-05-16 ENCOUNTER — Other Ambulatory Visit: Payer: Self-pay

## 2019-05-16 ENCOUNTER — Ambulatory Visit (INDEPENDENT_AMBULATORY_CARE_PROVIDER_SITE_OTHER): Payer: Medicare Other | Admitting: Family Medicine

## 2019-05-16 VITALS — BP 127/58 | HR 77 | Temp 98.4°F | Resp 16 | Ht 64.0 in | Wt 139.0 lb

## 2019-05-16 DIAGNOSIS — I10 Essential (primary) hypertension: Secondary | ICD-10-CM

## 2019-05-16 DIAGNOSIS — Z0000 Encounter for general adult medical examination without abnormal findings: Secondary | ICD-10-CM

## 2019-05-16 DIAGNOSIS — H9193 Unspecified hearing loss, bilateral: Secondary | ICD-10-CM

## 2019-05-16 DIAGNOSIS — D509 Iron deficiency anemia, unspecified: Secondary | ICD-10-CM | POA: Diagnosis not present

## 2019-05-16 DIAGNOSIS — E782 Mixed hyperlipidemia: Secondary | ICD-10-CM

## 2019-05-16 DIAGNOSIS — Z23 Encounter for immunization: Secondary | ICD-10-CM | POA: Diagnosis not present

## 2019-05-16 DIAGNOSIS — R7303 Prediabetes: Secondary | ICD-10-CM

## 2019-05-16 DIAGNOSIS — H9113 Presbycusis, bilateral: Secondary | ICD-10-CM

## 2019-05-16 MED ORDER — LOSARTAN POTASSIUM-HCTZ 100-25 MG PO TABS: 1.0000 | ORAL_TABLET | Freq: Every day | ORAL | 3 refills | Status: DC

## 2019-05-16 MED ORDER — METOPROLOL TARTRATE 25 MG PO TABS: 25.0000 mg | ORAL_TABLET | Freq: Two times a day (BID) | ORAL | 3 refills | Status: DC

## 2019-05-16 NOTE — Progress Notes (Signed)
Subjective:    Patient ID: Rebecca Mitchell, female    DOB: 1940-10-19, 79 y.o.   MRN: [id number]  Rebecca Mitchell is a 78 y.o. female presenting on 05/16/2019 for Annual Exam   HPI   Here for Annual Physical and Lab Review.  Elevated A1c Mild elevated A1c to 5.9 CBGs:Not checking, not indicated Meds:Never on meds Currently on ARB Lifestyle: - Diet (Improving her balanced diet, tries to eat low carb, mostly water, very rarely tea / soda even less now) - Exercise (6 days a week, exercise classes and upper/lower body) Denies hypoglycemia  CHRONIC HTN: Reportsno new concerns. Checks BP at home normal range Current Meds -Amlodipine 9m daily, Losartan-HCTZ 100-264mdaily, Metoprolol 2575mID Reports good compliance, took meds today. Tolerating well, w/o complaints.  Chronic Scoliosis / ChronicBack Pain - Reports chronic problem with scoliosis since adolescent. Has been re-established with [place] [facility]ennis [facility] [facility], or any reduced breathing, muscle spasm or soreness.  Anemia On iron 3 x week, doing well, prior problem.  Severe Osteoporosis - Recent history[facility] Sol[facility] - on prolia - Today patient reports no new concerns. She remains active with exercise, cardio and wt bearing, trying to be careful to avoid injury, will f/u with endo for DEXA repeat in future - [facility] and has [facility]EXA scheduled at that time  HYPERLIPIDEMIA: - Reports no concerns. Last lipid panel 04/2019, controlled  - Currently taking pravastatin 75m30molerating well without side effects or myalgias   Additional complaints  - Right arm cramping - episodes of muscle cramp in arm, asking about treatment / remedy  Reduced Hearing, Bilateral Chronic problem now gradual worsening, discussed previously, mild reduced hearing now more noticeable wearing mask, she used to read lips more. No  prior hearing test she would like to pursue this Admits very rare tinnitus. Also some ear itching rarely Denies ear pain or pressure  Health Maintenance:  History of breast cancer, L breast. S/p lumpectomy and radiation. On Femara. Surveillance by Dr ByrnBary Castillast seen 10/2018 and had mammoram 10/21/2018  Bone Density - see above  Depression screen PHQ [facility] 05/16/2019 05/09/2019 06/20/2018  Decreased Interest 0 0 0  Down, Depressed, Hopeless 0 0 0  PHQ - 2 Score 0 0 0    Past Medical History:  Diagnosis Date  . Anemia   . Breast cancer (HCC)[place]/2016   left breast lumpectomy with rad tx  . Hyperlipidemia   . Malignant neoplasm of lower-inner quadrant of left female breast (HCC)[place]/2015   5 mm,T1a, N0; ER/ PR positive, HER-2/neu negative  . Osteoporosis   . Personal history of radiation therapy 2016   left breast ca   Past Surgical History:  Procedure Laterality Date  . BREAST BIOPSY Left 08/2014   invasive mammary carcinoma  . BREAST LUMPECTOMY Left 10/01/2014   invasive mammary carcinoma and DCIS with clear margins  . BREAST SURGERY Left 10/01/2014   lumpectomy  . COLONOSCOPY  2010  . COLONOSCOPY WITH PROPOFOL N/A 09/21/2015   Procedure: COLONOSCOPY WITH PROPOFOL;  Surgeon: SeepChristene Lye;  Location: [facility] ENDOSCOPY;  Service: Endoscopy;  Laterality: N/A;  . UPPER GI ENDOSCOPY  2014   Social History   Socioeconomic History  . Marital status: Married    Spouse name: Not on file  . Number of children: Not on file  . Years of education: Not on file  .  Highest education level: Bachelor's degree (e.g., BA, AB, BS)  Occupational History  . Not on file  Social Needs  . Financial resource strain: Not hard at all  . Food insecurity    Worry: Never true    Inability: Never true  . Transportation needs    Medical: No    Non-medical: No  Tobacco Use  . Smoking status: Never Smoker  . Smokeless tobacco: Never Used  Substance and Sexual Activity  . Alcohol use: No     Alcohol/week: 0.0 standard drinks  . Drug use: No  . Sexual activity: Not on file  Lifestyle  . Physical activity    Days per week: 5 days    Minutes per session: 60 min  . Stress: Not at all  Relationships  . Social connections    Talks on phone: More than three times a week    Gets together: More than three times a week    Attends religious service: More than 4 times per year    Active member of club or organization: Yes    Attends meetings of clubs or organizations: More than 4 times per year    Relationship status: Married  . Intimate partner violence    Fear of current or ex partner: No    Emotionally abused: No    Physically abused: No    Forced sexual activity: No  Other Topics Concern  . Not on file  Social History Narrative  . Not on file   Family History  Problem Relation Age of Onset  . Breast cancer Sister 23  . Heart disease Mother   . Heart disease Father    Current Outpatient Medications on File Prior to Visit  Medication Sig  . amLODipine (NORVASC) 5 MG tablet TAKE 1 TABLET BY MOUTH  DAILY  . Ascorbic Acid (VITAMIN C) 1000 MG tablet Take 1,000 mg by mouth daily.  Marland Kitchen aspirin EC 81 MG tablet Take 81 mg by mouth daily.  . calcium carbonate 100 mg/ml SUSP Take by mouth.  . Calcium Citrate-Vitamin D (CALCIUM CITRATE + PO) Take 1 tablet by mouth daily.  Marland Kitchen co-enzyme Q-10 30 MG capsule Take 30 mg by mouth 3 (three) times daily.  Marland Kitchen denosumab (PROLIA) 60 MG/ML SOLN injection Inject into the skin.   Marland Kitchen FERROUS SULFATE PO Take 1 tablet by mouth once a week.  . fluticasone (FLONASE) 50 MCG/ACT nasal spray USE 2 SPRAYS IN EACH NOSTRIL ONCE DAILY FOR UP TO 4 TO 6 WEEKS, THEN AS NEEDED FOR ALLERGIES  . letrozole (FEMARA) 2.5 MG tablet TAKE 1 TABLET BY MOUTH  DAILY  . loratadine (CLARITIN) 10 MG tablet Take 1 tablet (10 mg total) by mouth daily. For up to 1 month, then as needed for seasonal allergies.  . Lutein-Zeaxanthin 15-0.7 MG CAPS Take 1 capsule by mouth daily.  .  Multiple Vitamin (MULTIVITAMIN) tablet Take 1 tablet by mouth daily.  . Omega-3 Fatty Acids (FISH OIL PO) Take 1 capsule by mouth daily.  . pravastatin (PRAVACHOL) 20 MG tablet TAKE 1 TABLET BY MOUTH  DAILY  . Saccharomyces boulardii (PROBIOTIC) 250 MG CAPS Take 250 mg by mouth daily.   No current facility-administered medications on file prior to visit.     Review of Systems  Constitutional: Negative for activity change, appetite change, chills, diaphoresis, fatigue and fever.  HENT: Negative for congestion and hearing loss.   Eyes: Negative for visual disturbance.  Respiratory: Negative for apnea, cough, choking, chest tightness, shortness of breath  and wheezing.   Cardiovascular: Negative for chest pain, palpitations and leg swelling.  Gastrointestinal: Negative for abdominal pain, anal bleeding, blood in stool, constipation, diarrhea, nausea and vomiting.  Endocrine: Negative for cold intolerance.  Genitourinary: Negative for difficulty urinating, dysuria, frequency and hematuria.  Musculoskeletal: Negative for arthralgias and neck pain.       Scoliosis  Skin: Negative for rash.  Allergic/Immunologic: Negative for environmental allergies.  Neurological: Negative for dizziness, weakness, light-headedness, numbness and headaches.  Hematological: Negative for adenopathy.  Psychiatric/Behavioral: Negative for behavioral problems, dysphoric mood and sleep disturbance. The patient is not nervous/anxious.    Per HPI unless specifically indicated above      Objective:    BP (!) 127/58   Pulse 77   Temp 98.4 F (36.9 C) (Oral)   Resp 16   Ht '5\' 4"'  (1.626 m)   Wt 139 lb (63 kg)   BMI 23.86 kg/m   Wt Readings from Last 3 Encounters:  05/16/19 139 lb (63 kg)  10/31/18 139 lb (63 kg)  08/26/18 140 lb 8.7 oz (63.7 kg)    Physical Exam Vitals signs and nursing note reviewed.  Constitutional:      General: She is not in acute distress.    Appearance: She is well-developed. She is  not diaphoretic.     Comments: Well-appearing thin elderly female, comfortable, cooperative  HENT:     Head: Normocephalic and atraumatic.  Eyes:     General:        Right eye: No discharge.        Left eye: No discharge.     Conjunctiva/sclera: Conjunctivae normal.     Pupils: Pupils are equal, round, and reactive to light.  Neck:     Musculoskeletal: Normal range of motion and neck supple.     Thyroid: No thyromegaly.  Cardiovascular:     Rate and Rhythm: Normal rate and regular rhythm.     Heart sounds: Normal heart sounds. No murmur.  Pulmonary:     Effort: Pulmonary effort is normal. No respiratory distress.     Breath sounds: Normal breath sounds. No wheezing or rales.  Abdominal:     General: Bowel sounds are normal. There is no distension.     Palpations: Abdomen is soft. There is no mass.     Tenderness: There is no abdominal tenderness.  Musculoskeletal: Normal range of motion.        General: No tenderness.     Comments: Stable scoliosis with significant thoracic dextroscoliosis based on curve, non tender, no muscle spasm  Lymphadenopathy:     Cervical: No cervical adenopathy.  Skin:    General: Skin is warm and dry.     Findings: No erythema or rash.  Neurological:     Mental Status: She is alert and oriented to person, place, and time.     Comments: Distal sensation intact to light touch all extremities  Psychiatric:        Behavior: Behavior normal.     Comments: Well groomed, good eye contact, normal speech and thoughts         Chemistry      Component Value Date/Time   NA 138 05/09/2019 0900   NA 142 02/08/2016 0820   K 4.0 05/09/2019 0900   K 4.1 09/21/2014 1028   CL 102 05/09/2019 0900   CO2 26 05/09/2019 0900   BUN 19 05/09/2019 0900   BUN 22 02/08/2016 0820   CREATININE 0.66 05/09/2019 0900  Component Value Date/Time   CALCIUM 9.8 05/09/2019 0900   ALKPHOS 54 12/20/2016 0001   AST 17 05/09/2019 0900   ALT 16 05/09/2019 0900   BILITOT  0.6 05/09/2019 0900   BILITOT 0.7 02/08/2016 0820     Lipid Panel     Component Value Date/Time   CHOL 167 05/09/2019 0900   CHOL 165 02/08/2016 0820   TRIG 82 05/09/2019 0900   HDL 57 05/09/2019 0900   HDL 79 02/08/2016 0820   CHOLHDL 2.9 05/09/2019 0900   VLDL 12 12/20/2016 0001   LDLCALC 93 05/09/2019 0900   Recent Labs    06/20/18 0831 05/09/19 0900  HGBA1C 5.7* 5.9*     Results for orders placed or performed in visit on 05/09/19  Urine Culture   Specimen: Urine  Result Value Ref Range   MICRO NUMBER: [id number]    SPECIMEN QUALITY: Adequate    Sample Source URINE    STATUS: FINAL    Result:      Single organism less than 10,000 CFU/mL isolated. These organisms, commonly found on external and internal genitalia, are considered colonizers. No further testing performed.  POCT urinalysis dipstick  Result Value Ref Range   Color, UA amber    Clarity, UA clear    Glucose, UA Negative Negative   Bilirubin, UA negative    Ketones, UA negative    Spec Grav, UA 1.010 1.010 - 1.025   Blood, UA negative    pH, UA 5.0 5.0 - 8.0   Protein, UA Negative Negative   Urobilinogen, UA 0.2 0.2 or 1.0 E.U./dL   Nitrite, UA negative    Leukocytes, UA Negative Negative   Appearance clear    Odor        Assessment & Plan:   Problem List Items Addressed This Visit    Absolute anemia    Stable, now normal Hgb Reduce PO OTC iron supplement to 1 x weekly now instead of 3 x week      Essential hypertension    Well-controlled HTN - Home BP readings normal  No known complications   Plan:  1. Continue current BP regimen Losartan-HCTZ 100-63m daily, Metoprolol 277mBID, Amlodipine 81m7maily - again future consideration reduce amlodipine if low BP or symptoms 2. Encourage improved lifestyle - low sodium diet, regular exercise 3.  Continue monitor BP outside office, bring readings to next visit, if persistently >140/90 or new symptoms notify office sooner 4. Follow-up q 6  mo       Relevant Medications   metoprolol tartrate (LOPRESSOR) 25 MG tablet   losartan-hydrochlorothiazide (HYZAAR) 100-25 MG tablet   HLD (hyperlipidemia)    Controlled cholesterol on statin and lifestyle Last lipid panel 04/2019  Plan: 1. Continue current meds - pravastatin 24m90mily 2. Encourage improved lifestyle - low carb/cholesterol, reduce portion size, continue improving regular exercise      Relevant Medications   metoprolol tartrate (LOPRESSOR) 25 MG tablet   losartan-hydrochlorothiazide (HYZAAR) 100-25 MG tablet   Pre-diabetes    Mild increased A1c 5.7 up to 5.9 Attributed to diet No other significant risk factors  Plan:  1. Not on any therapy currently  2. Encourage improved lifestyle - reviewed basics of low carb, low sugar diet, continue improving regular exercise 3. Follow-up 6 months repeat A1c      Presbycusis    Referral to Audiology, [place]       Other Visit Diagnoses    Annual physical exam    -  Primary  Hearing reduced, bilateral       Relevant Orders   Ambulatory referral to Audiology   Needs flu shot       Relevant Orders   Flu Vaccine QUAD High Dose(Fluad) (Completed)      Referral to [facility] Audiology department for hearing testing for bilateral R>L hearing loss, seems gradual likely presbycusis, now with wearing masks, she was reading lips more, now more difficulty hearing. Request hearing test and eval if need hearing aid   Updated Health Maintenance information - Flu shot today, given Reviewed recent lab results with patient Encouraged improvement to lifestyle with diet and exercise   Meds ordered this encounter  Medications  . metoprolol tartrate (LOPRESSOR) 25 MG tablet    Sig: Take 1 tablet (25 mg total) by mouth 2 (two) times daily.    Dispense:  180 tablet    Refill:  3  . losartan-hydrochlorothiazide (HYZAAR) 100-25 MG tablet    Sig: Take 1 tablet by mouth daily.    Dispense:  90 tablet    Refill:  3    **Patient  requests 90 days supply**   Orders Placed This Encounter  Procedures  . Flu Vaccine QUAD High Dose(Fluad)  . Ambulatory referral to Audiology    Referral Priority:   Routine    Referral Type:   Audiology Exam    Referral Reason:   Specialty Services Required    Number of Visits Requested:   1    Follow up plan: Return in about 6 months (around 11/16/2019) for 6 month PreDM A1c.  Nobie Putnam, [facility] 05/16/2019, 9:24 AM

## 2019-05-16 NOTE — Assessment & Plan Note (Signed)
Referral to Audiology, [place] [facility]

## 2019-05-16 NOTE — Assessment & Plan Note (Signed)
Controlled cholesterol on statin and lifestyle Last lipid panel 04/2019  Plan: 1. Continue current meds - pravastatin 20mg  daily 2. Encourage improved lifestyle - low carb/cholesterol, reduce portion size, continue improving regular exercise

## 2019-05-16 NOTE — Patient Instructions (Addendum)
Thank you for coming to the office today.  Improve diet and limit sweets sugar sodas.   Reduce iron supplement to ONCE a week now.   Referral to Audiology for hearing test - stay tuned for apt, call them if you have not heard back in 2 weeks  [facility] [id number]  [place], [place] [address] Ph: [phone]   Leg cramps - Try spoonful of yellow mustard to relieve leg cramps or try daily to prevent the problem  - OTC natural option is Hyland's Leg Cramps (Dissolving tablet) take as needed for muscle cramps  ------------------------------------  1. Chemistry - Normal results, including electrolytes, kidney and liver function. Slightly elevated fasting sugar.   2. Hemoglobin A1c (Diabetes screening) - 5.9 (previous 5.7 to 5.8), slightly elevated, currently in range of Pre-Diabetes (>5.7 to 6.4)   3. Vitamin D - 37, normal   4. Cholesterol - Normal cholesterol results. On pravastatin   5. CBC Blood Counts - Normal, no anemia, other abnormality    Please schedule a Follow-up Appointment to: Return in about 6 months (around 11/16/2019) for 6 month PreDM A1c.  If you have any other questions or concerns, please feel free to call the office or send a message through [place]. You may also schedule an earlier appointment if necessary.  Additionally, you may be receiving a survey about your experience at our office within a few days to 1 week by e-mail or mail. We value your feedback.  Nobie Putnam, DO [place]

## 2019-05-16 NOTE — Assessment & Plan Note (Signed)
Well-controlled HTN - Home BP readings normal  No known complications   Plan:  1. Continue current BP regimen Losartan-HCTZ 100-25mg  daily, Metoprolol 25mg  BID, Amlodipine 5mg  daily - again future consideration reduce amlodipine if low BP or symptoms 2. Encourage improved lifestyle - low sodium diet, regular exercise 3.  Continue monitor BP outside office, bring readings to next visit, if persistently >140/90 or new symptoms notify office sooner 4. Follow-up q 6  mo

## 2019-05-16 NOTE — Assessment & Plan Note (Signed)
Mild increased A1c 5.7 up to 5.9 Attributed to diet No other significant risk factors  Plan:  1. Not on any therapy currently  2. Encourage improved lifestyle - reviewed basics of low carb, low sugar diet, continue improving regular exercise 3. Follow-up 6 months repeat A1c

## 2019-05-16 NOTE — Assessment & Plan Note (Signed)
Stable, now normal Hgb Reduce PO OTC iron supplement to 1 x weekly now instead of 3 x week

## 2019-05-23 DIAGNOSIS — M810 Age-related osteoporosis without current pathological fracture: Secondary | ICD-10-CM | POA: Diagnosis not present

## 2019-06-10 DIAGNOSIS — H903 Sensorineural hearing loss, bilateral: Secondary | ICD-10-CM | POA: Diagnosis not present

## 2019-06-17 ENCOUNTER — Ambulatory Visit (INDEPENDENT_AMBULATORY_CARE_PROVIDER_SITE_OTHER): Payer: Medicare Other

## 2019-06-17 VITALS — Ht 64.0 in | Wt 139.0 lb

## 2019-06-17 DIAGNOSIS — Z0000 Encounter for general adult medical examination without abnormal findings: Secondary | ICD-10-CM | POA: Diagnosis not present

## 2019-06-17 NOTE — Progress Notes (Signed)
Subjective:   Rebecca Mitchell is a 78 y.o. female who presents for Medicare Annual (Subsequent) preventive examination.  This visit is being conducted via phone call  - after an attmept to do on video chat - due to the COVID-19 pandemic. This patient has given me verbal consent via phone to conduct this visit, patient states they are participating from their home address. Some vital signs may be absent or patient reported.   Patient identification: identified by name, DOB, and current address.    Review of Systems:   Cardiac Risk Factors include: advanced age (>43mn, >>3women);hypertension;dyslipidemia     Objective:     Vitals: Ht '5\' 4"'  (1.626 m)   Wt 139 lb (63 kg) Comment: pt reported  BMI 23.86 kg/m   Body mass index is 23.86 kg/m.  Advanced Directives 06/17/2019 08/26/2018 05/14/2018 10/10/2017 08/23/2017 05/01/2017 10/04/2015  Does Patient Have a Medical Advance Directive? No No No No No No No  Copy of Healthcare Power of Attorney in Chart? - - - - - - No - copy requested  Would patient like information on creating a medical advance directive? - No - Patient declined No - Patient declined No - Patient declined No - Patient declined Yes (MAU/Ambulatory/Procedural Areas - Information given) -    Tobacco Social History   Tobacco Use  Smoking Status Never Smoker  Smokeless Tobacco Never Used     Counseling given: Not Answered   Clinical Intake:  Pre-visit preparation completed: Yes  Pain : No/denies pain     Nutritional Status: BMI of 19-24  Normal Nutritional Risks: None Diabetes: No  How often do you need to have someone help you when you read instructions, pamphlets, or other written materials from your doctor or pharmacy?: 1 - Never  Interpreter Needed?: No  Information entered by :: Tiffany Hill,LPN  Past Medical History:  Diagnosis Date  . Anemia   . Breast cancer ([facility] 09/2014   left breast lumpectomy with rad tx  . Hyperlipidemia   . Malignant  neoplasm of lower-inner quadrant of left female breast ([facility] 07/2014   5 mm,T1a, N0; ER/ PR positive, HER-2/neu negative  . Osteoporosis   . Personal history of radiation therapy 2016   left breast ca   Past Surgical History:  Procedure Laterality Date  . BREAST BIOPSY Left 08/2014   invasive mammary carcinoma  . BREAST LUMPECTOMY Left 10/01/2014   invasive mammary carcinoma and DCIS with clear margins  . BREAST SURGERY Left 10/01/2014   lumpectomy  . COLONOSCOPY  2010  . COLONOSCOPY WITH PROPOFOL N/A 09/21/2015   Procedure: COLONOSCOPY WITH PROPOFOL;  Surgeon: SChristene Lye MD;  Location: [facility] ENDOSCOPY;  Service: Endoscopy;  Laterality: N/A;  . UPPER GI ENDOSCOPY  2014   Family History  Problem Relation Age of Onset  . Breast cancer Sister 671 . Heart disease Mother   . Heart disease Father    Social History   Socioeconomic History  . Marital status: Married    Spouse name: Not on file  . Number of children: Not on file  . Years of education: Not on file  . Highest education level: Bachelor's degree (e.g., BA, AB, BS)  Occupational History  . Not on file  Social Needs  . Financial resource strain: Not hard at all  . Food insecurity    Worry: Never true    Inability: Never true  . Transportation needs    Medical: No    Non-medical: No  Tobacco Use  . Smoking status: Never Smoker  . Smokeless tobacco: Never Used  Substance and Sexual Activity  . Alcohol use: No    Alcohol/week: 0.0 standard drinks  . Drug use: No  . Sexual activity: Not on file  Lifestyle  . Physical activity    Days per week: 5 days    Minutes per session: 60 min  . Stress: Not at all  Relationships  . Social connections    Talks on phone: More than three times a week    Gets together: More than three times a week    Attends religious service: More than 4 times per year    Active member of club or organization: Yes    Attends meetings of clubs or organizations: More than 4 times  per year    Relationship status: Married  Other Topics Concern  . Not on file  Social History Narrative  . Not on file    Outpatient Encounter Medications as of 06/17/2019  Medication Sig  . amLODipine (NORVASC) 5 MG tablet TAKE 1 TABLET BY MOUTH  DAILY  . Ascorbic Acid (VITAMIN C) 1000 MG tablet Take 1,000 mg by mouth daily.  Marland Kitchen aspirin EC 81 MG tablet Take 81 mg by mouth daily.  . calcium carbonate 100 mg/ml SUSP Take by mouth.  . Cholecalciferol (VITAMIN D3) 10 MCG (400 UNIT) CAPS Take by mouth.  . co-enzyme Q-10 30 MG capsule Take 30 mg by mouth 3 (three) times daily.  Marland Kitchen denosumab (PROLIA) 60 MG/ML SOLN injection Inject into the skin.   Marland Kitchen FERROUS SULFATE PO Take 1 tablet by mouth once a week.  . fluticasone (FLONASE) 50 MCG/ACT nasal spray USE 2 SPRAYS IN EACH NOSTRIL ONCE DAILY FOR UP TO 4 TO 6 WEEKS, THEN AS NEEDED FOR ALLERGIES  . letrozole (FEMARA) 2.5 MG tablet TAKE 1 TABLET BY MOUTH  DAILY  . loratadine (CLARITIN) 10 MG tablet Take 1 tablet (10 mg total) by mouth daily. For up to 1 month, then as needed for seasonal allergies.  Marland Kitchen losartan-hydrochlorothiazide (HYZAAR) 100-25 MG tablet Take 1 tablet by mouth daily.  . Lutein-Zeaxanthin 15-0.7 MG CAPS Take 1 capsule by mouth daily.  . metoprolol tartrate (LOPRESSOR) 25 MG tablet Take 1 tablet (25 mg total) by mouth 2 (two) times daily.  . Multiple Vitamin (MULTIVITAMIN) tablet Take 1 tablet by mouth daily.  . Omega-3 Fatty Acids (FISH OIL PO) Take 1 capsule by mouth daily.  . pravastatin (PRAVACHOL) 20 MG tablet TAKE 1 TABLET BY MOUTH  DAILY  . Saccharomyces boulardii (PROBIOTIC) 250 MG CAPS Take 250 mg by mouth daily.  . [DISCONTINUED] Calcium Citrate-Vitamin D (CALCIUM CITRATE + PO) Take 1 tablet by mouth daily.   No facility-administered encounter medications on file as of 06/17/2019.     Activities of Daily Living In your present state of health, do you have any difficulty performing the following activities: 06/17/2019   Hearing? Y  Comment getting fitted tomorrow for hearing aids  Vision? N  Comment eyeglasses, goes to dr. nice  Difficulty concentrating or making decisions? N  Walking or climbing stairs? N  Dressing or bathing? N  Doing errands, shopping? N  Preparing Food and eating ? N  Using the Toilet? N  In the past six months, have you accidently leaked urine? N  Do you have problems with loss of bowel control? N  Managing your Medications? N  Managing your Finances? N  Housekeeping or managing your Housekeeping? N  Some recent  data might be hidden    Patient Care Team: Olin Hauser, DO as PCP - General (Family Medicine) Noreene Filbert, MD as Referring Physician (Radiation Oncology) Bary Castilla Forest Gleason, MD (General Surgery) Jannet Mantis, MD (Dermatology) Gabriel Carina Betsey Holiday, MD as Physician Assistant (Endocrinology)    Assessment:   This is a routine wellness examination for Rmani.  Exercise Activities and Dietary recommendations Current Exercise Habits: Home exercise routine, Type of exercise: stretching;strength training/weights;calisthenics;yoga, Time (Minutes): 60, Frequency (Times/Week): 5, Weekly Exercise (Minutes/Week): 300, Intensity: Mild, Exercise limited by: None identified  Goals    . DIET - INCREASE WATER INTAKE     Recommend drinking at least 6-8 glasses of water a day        Fall Risk: Fall Risk  06/17/2019 05/16/2019 05/09/2019 10/31/2018 06/20/2018  Falls in the past year? 0 0 0 0 No  Number falls in past yr: 0 - - 0 -  Injury with Fall? 0 - - - -  Follow up - Falls evaluation completed Falls evaluation completed Falls evaluation completed -    FALL RISK PREVENTION PERTAINING TO THE HOME:  Any stairs in or around the home? Yes  If so, are there any without handrails? No   Home free of loose throw rugs in walkways, pet beds, electrical cords, etc? Yes  Adequate lighting in your home to reduce risk of falls? Yes   ASSISTIVE DEVICES UTILIZED TO  PREVENT FALLS:  Life alert? No  Use of a cane, walker or w/c? No  Grab bars in the bathroom? No  Shower chair or bench in shower? No  Elevated toilet seat or a handicapped toilet? No   DME ORDERS:  DME order needed?  No   TIMED UP AND GO:  Unable to perform    Depression Screen PHQ 2/9 Scores 06/17/2019 05/16/2019 05/09/2019 06/20/2018  PHQ - 2 Score 0 0 0 0     Cognitive Function     6CIT Screen 05/01/2017  What Year? 0 points  What month? 0 points  What time? 0 points  Count back from 20 0 points  Months in reverse 0 points  Repeat phrase 0 points  Total Score 0    Immunization History  Administered Date(s) Administered  . Fluad Quad(high Dose 65+) 05/16/2019  . Influenza, High Dose Seasonal PF 08/15/2016, 06/13/2017, 06/20/2018  . Influenza,inj,Quad PF,6+ Mos 06/14/2015  . Pneumococcal Polysaccharide-23 08/12/2012    Qualifies for Shingles Vaccine? Yes  Zostavax completed n/a. Due for Shingrix. Education has been provided regarding the importance of this vaccine. Pt has been advised to call insurance company to determine out of pocket expense. Advised may also receive vaccine at local pharmacy or Health Dept. Verbalized acceptance and understanding.  Tdap: up to date   Flu Vaccine: up to date   Pneumococcal Vaccine: up to date   Screening Tests Health Maintenance  Topic Date Due  . TETANUS/TDAP  03/25/2024  . INFLUENZA VACCINE  Completed  . DEXA SCAN  Completed  . PNA vac Low Risk Adult  Completed    Cancer Screenings:  Colorectal Screening: no longer required   Mammogram: completed 10/21/2018  Bone Density: completed   Lung Cancer Screening: (Low Dose CT Chest recommended if Age 42-80 years, 30 pack-year currently smoking OR have quit w/in 15years.) does not qualify.    Additional Screening:  Hepatitis C Screening: does not qualify  Vision Screening: Recommended annual ophthalmology exams for early detection of glaucoma and other disorders of  the eye. Is the  patient up to date with their annual eye exam?  Yes  Who is the provider or what is the name of the office in which the pt attends annual eye exams? Dr.Nice   Dental Screening: Recommended annual dental exams for proper oral hygiene  Community Resource Referral:  CRR required this visit?  No       Plan:  I have personally reviewed and addressed the Medicare Annual Wellness questionnaire and have noted the following in the patient's chart:  A. Medical and social history B. Use of alcohol, tobacco or illicit drugs  C. Current medications and supplements D. Functional ability and status E.  Nutritional status F.  Physical activity G. Advance directives H. List of other physicians I.  Hospitalizations, surgeries, and ER visits in previous 12 months J.  [place] such as hearing and vision if needed, cognitive and depression L. Referrals and appointments   In addition, I have reviewed and discussed with patient certain preventive protocols, quality metrics, and best practice recommendations. A written personalized care plan for preventive services as well as general preventive health recommendations were provided to patient.  Signed,    Bevelyn Ngo, LPN  10/29/1733 Nurse Health Advisor   Nurse Notes: none

## 2019-07-18 DIAGNOSIS — H905 Unspecified sensorineural hearing loss: Secondary | ICD-10-CM | POA: Diagnosis not present

## 2019-08-07 ENCOUNTER — Other Ambulatory Visit: Payer: Self-pay | Admitting: Family Medicine

## 2019-08-07 DIAGNOSIS — I10 Essential (primary) hypertension: Secondary | ICD-10-CM

## 2019-08-29 ENCOUNTER — Other Ambulatory Visit: Payer: Self-pay

## 2019-09-01 ENCOUNTER — Encounter: Payer: Self-pay | Admitting: Radiation Oncology

## 2019-09-01 ENCOUNTER — Other Ambulatory Visit: Payer: Self-pay

## 2019-09-01 ENCOUNTER — Ambulatory Visit
Admission: RE | Admit: 2019-09-01 | Discharge: 2019-09-01 | Disposition: A | Discharge status: Home / Self Care | Payer: Medicare Other | Source: Ambulatory Visit | Attending: Radiation Oncology | Admitting: Radiation Oncology

## 2019-09-01 VITALS — BP 144/78 | HR 73 | Temp 98.9°F | Resp 16 | Wt 139.5 lb

## 2019-09-01 DIAGNOSIS — Z79811 Long term (current) use of aromatase inhibitors: Secondary | ICD-10-CM | POA: Diagnosis not present

## 2019-09-01 DIAGNOSIS — Z923 Personal history of irradiation: Secondary | ICD-10-CM | POA: Insufficient documentation

## 2019-09-01 DIAGNOSIS — Z170 Estrogen receptor positive status [ER+]: Secondary | ICD-10-CM | POA: Insufficient documentation

## 2019-09-01 DIAGNOSIS — C50312 Malignant neoplasm of lower-inner quadrant of left female breast: Secondary | ICD-10-CM | POA: Insufficient documentation

## 2019-09-02 NOTE — Progress Notes (Signed)
Radiation Oncology Follow up Note  Name: Rebecca Mitchell   Date:   09/01/2019 MRN:  [id number] DOB: 1941/01/10    This 78 y.o. female presents to the clinic today for 4-1/2-year follow-up status post whole breast radiation to her left breast for stage I ER/PR positive invasive mammary carcinoma.  REFERRING PROVIDER: Nobie Putnam *  HPI: Patient is a 78 year old female now about 4-1/2 years having completed whole breast radiation to her left breast for stage I ER/PR positive invasive mammary carcinoma.  Seen today in routine follow-up she is doing well.  She specifically denies breast tenderness cough or bone pain.  She is currently on.  Femara tolerating it well without side effect.  She had mammograms back in February which I have reviewed were BI-RADS 2 benign.  COMPLICATIONS OF TREATMENT: none  FOLLOW UP COMPLIANCE: keeps appointments   PHYSICAL EXAM:  BP (!) 144/78 (BP Location: Left Arm, Patient Position: Sitting)   Pulse 73   Temp 98.9 F (37.2 C) (Tympanic)   Resp 16   Wt 139 lb 8 oz (63.3 kg)   BMI 23.95 kg/m  Lungs are clear to A&P cardiac examination essentially unremarkable with regular rate and rhythm. No dominant mass or nodularity is noted in either breast in 2 positions examined. Incision is well-healed. No axillary or supraclavicular adenopathy is appreciated. Cosmetic result is excellent.  Well-developed well-nourished patient in NAD. HEENT reveals PERLA, EOMI, discs not visualized.  Oral cavity is clear. No oral mucosal lesions are identified. Neck is clear without evidence of cervical or supraclavicular adenopathy. Lungs are clear to A&P. Cardiac examination is essentially unremarkable with regular rate and rhythm without murmur rub or thrill. Abdomen is benign with no organomegaly or masses noted. Motor sensory and DTR levels are equal and symmetric in the upper and lower extremities. Cranial nerves II through XII are grossly intact. Proprioception is intact. No  peripheral adenopathy or edema is identified. No motor or sensory levels are noted. Crude visual fields are within normal range.  RADIOLOGY RESULTS: Mammograms reviewed compatible with above-stated findings  PLAN: Present time she is now close to 5 years out I am going to discontinue follow-up care.  She will be talking to her surgeon about continuing on Femara.  Patient is to call at anytime with any concerns.  I am pleased with her overall progress.  I would like to take this opportunity to thank you for allowing me to participate in the care of your patient.Noreene Filbert, MD

## 2019-09-08 ENCOUNTER — Other Ambulatory Visit: Payer: Self-pay | Admitting: Family Medicine

## 2019-09-08 ENCOUNTER — Telehealth: Payer: Self-pay

## 2019-09-08 DIAGNOSIS — I10 Essential (primary) hypertension: Secondary | ICD-10-CM

## 2019-09-08 MED ORDER — AMLODIPINE BESYLATE 5 MG PO TABS: 5.0000 mg | ORAL_TABLET | Freq: Every day | ORAL | 0 refills | Status: DC

## 2019-09-08 MED ORDER — METOPROLOL TARTRATE 25 MG PO TABS: 25.0000 mg | ORAL_TABLET | Freq: Two times a day (BID) | ORAL | 0 refills | Status: DC

## 2019-09-08 NOTE — Telephone Encounter (Signed)
Ordered both meds for 1 month supply to local [facility], 0 refills. She should still have refills at [facility], I am unsure the timeline for them to fill as well.  If she needs more or needs [place] to re order to [facility], she can notify them or [place].  Nobie Putnam, DO [facility] 09/08/2019, 4:36 PM

## 2019-09-08 NOTE — Telephone Encounter (Signed)
Patient is requesting metoprolol and amlodipine since haven't received from the mail order pharmacy, I think it is in process but she doesn't know when she can receive it and she is out today is her last day, are you willing to Rx temporary for at least week and if she doesn't receive by week she can call us.

## 2019-09-08 NOTE — Progress Notes (Signed)
See other message

## 2019-09-08 NOTE — Telephone Encounter (Signed)
Patient advised.

## 2019-09-16 ENCOUNTER — Other Ambulatory Visit: Payer: Self-pay | Admitting: General Surgery

## 2019-09-16 DIAGNOSIS — Z853 Personal history of malignant neoplasm of breast: Secondary | ICD-10-CM

## 2019-10-08 DIAGNOSIS — M810 Age-related osteoporosis without current pathological fracture: Secondary | ICD-10-CM | POA: Diagnosis not present

## 2019-10-15 DIAGNOSIS — M810 Age-related osteoporosis without current pathological fracture: Secondary | ICD-10-CM | POA: Diagnosis not present

## 2019-10-16 ENCOUNTER — Other Ambulatory Visit: Payer: Self-pay | Admitting: Family Medicine

## 2019-10-16 DIAGNOSIS — I10 Essential (primary) hypertension: Secondary | ICD-10-CM

## 2019-10-23 ENCOUNTER — Ambulatory Visit
Admission: RE | Admit: 2019-10-23 | Discharge: 2019-10-23 | Disposition: A | Discharge status: Home / Self Care | Payer: Medicare Other | Source: Ambulatory Visit | Attending: General Surgery | Admitting: General Surgery

## 2019-10-23 DIAGNOSIS — Z853 Personal history of malignant neoplasm of breast: Secondary | ICD-10-CM | POA: Diagnosis not present

## 2019-10-23 DIAGNOSIS — R922 Inconclusive mammogram: Secondary | ICD-10-CM | POA: Diagnosis not present

## 2019-11-14 ENCOUNTER — Other Ambulatory Visit: Payer: Self-pay | Admitting: Family Medicine

## 2019-11-14 ENCOUNTER — Encounter: Payer: Self-pay | Admitting: Family Medicine

## 2019-11-14 ENCOUNTER — Ambulatory Visit (INDEPENDENT_AMBULATORY_CARE_PROVIDER_SITE_OTHER): Payer: Medicare Other | Admitting: Family Medicine

## 2019-11-14 ENCOUNTER — Ambulatory Visit: Payer: Medicare Other | Admitting: Family Medicine

## 2019-11-14 ENCOUNTER — Other Ambulatory Visit: Payer: Self-pay

## 2019-11-14 VITALS — BP 121/64 | HR 68 | Temp 97.4°F | Resp 16 | Ht 64.0 in | Wt 136.6 lb

## 2019-11-14 DIAGNOSIS — R0609 Other forms of dyspnea: Secondary | ICD-10-CM

## 2019-11-14 DIAGNOSIS — R0600 Dyspnea, unspecified: Secondary | ICD-10-CM

## 2019-11-14 DIAGNOSIS — E782 Mixed hyperlipidemia: Secondary | ICD-10-CM

## 2019-11-14 DIAGNOSIS — R7303 Prediabetes: Secondary | ICD-10-CM

## 2019-11-14 DIAGNOSIS — Z853 Personal history of malignant neoplasm of breast: Secondary | ICD-10-CM | POA: Diagnosis not present

## 2019-11-14 DIAGNOSIS — D509 Iron deficiency anemia, unspecified: Secondary | ICD-10-CM

## 2019-11-14 DIAGNOSIS — M41124 Adolescent idiopathic scoliosis, thoracic region: Secondary | ICD-10-CM

## 2019-11-14 DIAGNOSIS — I10 Essential (primary) hypertension: Secondary | ICD-10-CM

## 2019-11-14 DIAGNOSIS — C50312 Malignant neoplasm of lower-inner quadrant of left female breast: Secondary | ICD-10-CM

## 2019-11-14 DIAGNOSIS — H9113 Presbycusis, bilateral: Secondary | ICD-10-CM

## 2019-11-14 DIAGNOSIS — Z0000 Encounter for general adult medical examination without abnormal findings: Secondary | ICD-10-CM

## 2019-11-14 LAB — POCT GLYCOSYLATED HEMOGLOBIN (HGB A1C): Hemoglobin A1C: 5.6 % (ref 4.0–5.6)

## 2019-11-14 NOTE — Assessment & Plan Note (Signed)
A1c improved to 5.6, prior 5.7 to 5.9 Attributed to diet No other significant risk factors  Plan:  1. Not on any therapy currently  2. Encourage improved lifestyle - reviewed basics of low carb, low sugar diet, continue improving regular exercise

## 2019-11-14 NOTE — Assessment & Plan Note (Signed)
Chronic problem since adolescent, thoracic/lumbar Previously followed with Back / Scoliosis specialist in [facility] Concern with worsening dyspnea now, may have to reconsider impact of scoliosis on her lung function, can return to specialist if other work-up Chest X-ray / Cardiology referral does not demonstrate cause of dyspnea

## 2019-11-14 NOTE — Assessment & Plan Note (Signed)
Improved Hearing aids bilateral 

## 2019-11-14 NOTE — Assessment & Plan Note (Signed)
S/p lumpectomy and radiation Last month of Letrozole then finished, 5 years No evidence of active breast cancer No longer followed by Dr Baruch Gouty RadOnc Surveillance per general surgery Dr Bary Castilla now yearly

## 2019-11-14 NOTE — Patient Instructions (Addendum)
Thank you for coming to the office today.  Call back with COVID19 vaccine dates with [place] + date of first and second vaccine for both you and Jeneen Rinks.  Keep up the good work with the low sugar diet.  Recent Labs    05/09/19 0900 11/14/19 1358  HGBA1C 5.9* 5.6   For shortness of breath  Ordered Chest X-ray next week, can be done ANYTIME during business hours, walk in tell them you are here for X-ray, no apt needed.  Ordered referral to Dr Edd [facility], stay tuned for apt, he can do stress test and ECHO  Lastly, may be due to scoliosis, keep that in mind as well.  DUE for FASTING BLOOD WORK (no food or drink after midnight before the lab appointment, only water or coffee without cream/sugar on the morning of)  SCHEDULE "Lab Only" visit in the morning at the clinic for lab draw in 6 MONTHS   - Make sure Lab Only appointment is at about 1 week before your next appointment, so that results will be available  For Lab Results, once available within 2-3 days of blood draw, you can can log in to MyChart online to view your results and a brief explanation. Also, we can discuss results at next follow-up visit.   Please schedule a Follow-up Appointment to: Return in about 6 months (around 05/13/2020) for Annual Physical.  If you have any other questions or concerns, please feel free to call the office or send a message through [place]. You may also schedule an earlier appointment if necessary.  Additionally, you may be receiving a survey about your experience at our office within a few days to 1 week by e-mail or mail. We value your feedback.  Nobie Putnam, DO [place]

## 2019-11-14 NOTE — Progress Notes (Signed)
Subjective:    Patient ID: Rebecca Mitchell, female    DOB: 1941-01-28, 79 y.o.   MRN: [id number]  [name] is a 79 y.o. female presenting on 11/14/2019 for PreDM   HPI   Here for Annual Physical and Lab Review.  Elevated A1c Previous elevated A1c 5.7 to 5.9 - Now result is down to 5.6 CBGs:Not checking, not indicated Meds:Never on meds Currently on ARB Lifestyle: - Diet (Improving her balanceddiet less sweets and reduce baked goods, not adding as much sugar to sweet tea, more water now) - Exercise (less regular exercise now) Denies hypoglycemia  CHRONIC HTN: Reportsno new concerns. Checks BP at home normal range Current Meds -Amlodipine 5mg  daily, Losartan-HCTZ 100-25mg  daily, Metoprolol 25mg  BID Reports good compliance, took meds today. Tolerating well, w/o complaints.  Chronic Scoliosis / ChronicBack Pain - Reports chronic problem with scoliosis since adolescent. Has been re-established with Dr Rennis Harding (Spine & Scoliosis Specialist in [place]). Doing well without pain, muscle spasm or soreness. - She has concerns with breathing see below  Anemia On iron 3 x week, doing well, prior problem. Last lab 04/2019, was normal Hgb  History of Breast Cancer Now 5 year survivor, breast cancer - Previously followed by Dr Baruch Gouty (now will be complete with Dr Baruch Gouty) and will still Dr Bary Castilla yearly for mammogram. Also now final month of Letrozole.   Additional complaints  Dyspnea on Exertion Reports since 3 months has had some decreased exercise and activity tolerance. She is noticing it more often with ambulation and exertion, doing chores, she has noticed has to stop and catch breath will feel fatigued or dyspnea, she often will stop and rest a few minutes to catch her breath and then continue activity. - She has fam history of Heart disease in her parents.  Reduced Hearing, Bilateral Last visit 04/2019, she was referred to Audiology for hearing aid  evaluation. Now she has bilateral hearing aids, and does well. Limited sometimes with masks harder to read lips but overall she is improved.  Health Maintenance: UTD COVID19 vaccine request dates, she will call back  Depression screen [facility] 2/9 06/17/2019 05/16/2019 05/09/2019  Decreased Interest 0 0 0  Down, Depressed, Hopeless 0 0 0  PHQ - 2 Score 0 0 0    Social History   Tobacco Use  . Smoking status: Never Smoker  . Smokeless tobacco: Never Used  Substance Use Topics  . Alcohol use: No    Alcohol/week: 0.0 standard drinks  . Drug use: No    Review of Systems Per HPI unless specifically indicated above     Objective:    BP 121/64   Pulse 68   Temp (!) 97.4 F (36.3 C) (Temporal)   Resp 16   Ht 5\' 4"  (1.626 m)   Wt 136 lb 9.6 oz (62 kg)   BMI 23.45 kg/m   Wt Readings from Last 3 Encounters:  11/14/19 136 lb 9.6 oz (62 kg)  09/01/19 139 lb 8 oz (63.3 kg)  06/17/19 139 lb (63 kg)    Physical Exam Vitals and nursing note reviewed.  Constitutional:      General: She is not in acute distress.    Appearance: She is well-developed. She is not diaphoretic.     Comments: Well appearing 79 yr old female, comfortable, cooperative  HENT:     Head: Normocephalic and atraumatic.  Eyes:     General:        Right eye: No discharge.  Left eye: No discharge.     Conjunctiva/sclera: Conjunctivae normal.     Pupils: Pupils are equal, round, and reactive to light.  Neck:     Thyroid: No thyromegaly.  Cardiovascular:     Rate and Rhythm: Normal rate and regular rhythm.     Heart sounds: Normal heart sounds. No murmur.  Pulmonary:     Effort: Pulmonary effort is normal. No respiratory distress.     Breath sounds: No wheezing or rales.     Comments: Slight diminished breath sounds bilateral lung bases Abdominal:     General: Bowel sounds are normal. There is no distension.     Palpations: Abdomen is soft. There is no mass.     Tenderness: There is no abdominal  tenderness.  Musculoskeletal:        General: No tenderness. Normal range of motion.     Cervical back: Normal range of motion and neck supple.     Comments: Stable scoliosis with significant thoracic dextroscoliosis based on curve, non tender, no muscle spasm  Lymphadenopathy:     Cervical: No cervical adenopathy.  Skin:    General: Skin is warm and dry.     Findings: No erythema or rash.  Neurological:     Mental Status: She is alert and oriented to person, place, and time.     Comments: Distal sensation intact to light touch all extremities  Psychiatric:        Behavior: Behavior normal.     Comments: Well groomed, good eye contact, normal speech and thoughts       Results for orders placed or performed in visit on 11/14/19  POCT HgB A1C  Result Value Ref Range   Hemoglobin A1C 5.6 4.0 - 5.6 %   Recent Labs    05/09/19 0900 11/14/19 1358  HGBA1C 5.9* 5.6       Assessment & Plan:   Problem List Items Addressed This Visit    Scoliosis    Chronic problem since adolescent, thoracic/lumbar Previously followed with Back / Scoliosis specialist in [facility] Concern with worsening dyspnea now, may have to reconsider impact of scoliosis on her lung function, can return to specialist if other work-up Chest X-ray / Cardiology referral does not demonstrate cause of dyspnea      Relevant Orders   DG Chest 2 View   Presbycusis    Improved Hearing aids bilateral      Pre-diabetes - Primary    A1c improved to 5.6, prior 5.7 to 5.9 Attributed to diet No other significant risk factors  Plan:  1. Not on any therapy currently  2. Encourage improved lifestyle - reviewed basics of low carb, low sugar diet, continue improving regular exercise      Relevant Orders   POCT HgB A1C (Completed)   History of breast cancer in female    S/p lumpectomy and radiation Last month of Letrozole then finished, 5 years No evidence of active breast cancer No longer followed by Dr Baruch Gouty  RadOnc Surveillance per general surgery Dr Bary Castilla now yearly       Other Visit Diagnoses    Dyspnea on exertion       Relevant Orders   Ambulatory referral to Cardiology   DG Chest 2 View   Malignant neoplasm of lower-inner quadrant of left female breast, unspecified estrogen receptor status ([place])   (Chronic)        #Dyspnea on Exertion Concern with progressive dyspnea over past 3 months, mild to moderate problem, improve w/  rest, no other attributed symptoms. No edema or other cardiac or chest symptoms.  - Clinically no evidence of lung disease, except at risk w/ Scoliosis can provide restriction of lungs mechanically if progression  - Concern for possible cardiac related DOE given her fam history, however she has no prior history, only risk factors age 63 and HLD  Will refer to [facility] Dr Clayborn Bigness for further eval, anticipate would benefit from ECHO and possible stress test ultimately if suspicion for cardiovascular cause.  Chest-[facility] ordered [facility] at [facility] for next week, she can do walk in on Monday, follow up results.  If worsening scoliosis may need to return to specialist  Strict return criteria acutely if worsening dyspnea.  No orders of the defined types were placed in this encounter.  Orders Placed This Encounter  Procedures  . DG Chest 2 View    Standing Status:   Future    Standing Expiration Date:   01/11/2021    Order Specific Question:   Reason for Exam (SYMPTOM  OR DIAGNOSIS REQUIRED)    Answer:   dyspnea on exertion, has known severe chronic scoliosis    Order Specific Question:   Preferred imaging location?    Answer:   [facility]    Order Specific Question:   Radiology Contrast Protocol - do NOT remove file path    Answer:   [email or web address]  . Ambulatory referral to Cardiology    Referral Priority:   Routine    Referral Type:   Consultation    Referral Reason:   Specialty Services Required    Requested  Specialty:   Cardiology    Number of Visits Requested:   1  . POCT HgB A1C     Follow up plan: Return in about 6 months (around 05/13/2020) for Annual Physical.  Future labs ordered for 04/2020   Nobie Putnam, [facility] 11/14/2019, 1:40 PM

## 2019-11-17 ENCOUNTER — Other Ambulatory Visit: Payer: Self-pay

## 2019-11-17 ENCOUNTER — Ambulatory Visit
Admission: RE | Admit: 2019-11-17 | Discharge: 2019-11-17 | Disposition: A | Discharge status: Home / Self Care | Payer: Medicare Other | Attending: Family Medicine | Admitting: Family Medicine

## 2019-11-17 ENCOUNTER — Ambulatory Visit
Admission: RE | Admit: 2019-11-17 | Discharge: 2019-11-17 | Disposition: A | Discharge status: Home / Self Care | Payer: Medicare Other | Source: Ambulatory Visit | Attending: Family Medicine | Admitting: Family Medicine

## 2019-11-17 DIAGNOSIS — M41124 Adolescent idiopathic scoliosis, thoracic region: Secondary | ICD-10-CM

## 2019-11-17 DIAGNOSIS — R0609 Other forms of dyspnea: Secondary | ICD-10-CM

## 2019-11-17 DIAGNOSIS — R0600 Dyspnea, unspecified: Secondary | ICD-10-CM | POA: Insufficient documentation

## 2019-11-24 DIAGNOSIS — M810 Age-related osteoporosis without current pathological fracture: Secondary | ICD-10-CM | POA: Diagnosis not present

## 2019-11-25 DIAGNOSIS — R0602 Shortness of breath: Secondary | ICD-10-CM | POA: Diagnosis not present

## 2019-11-25 DIAGNOSIS — R0600 Dyspnea, unspecified: Secondary | ICD-10-CM | POA: Diagnosis not present

## 2019-11-25 DIAGNOSIS — I208 Other forms of angina pectoris: Secondary | ICD-10-CM | POA: Diagnosis not present

## 2019-11-25 DIAGNOSIS — Z7689 Persons encountering health services in other specified circumstances: Secondary | ICD-10-CM | POA: Diagnosis not present

## 2019-11-25 DIAGNOSIS — I10 Essential (primary) hypertension: Secondary | ICD-10-CM | POA: Diagnosis not present

## 2019-12-08 ENCOUNTER — Other Ambulatory Visit: Payer: Self-pay | Admitting: Family Medicine

## 2019-12-08 DIAGNOSIS — E785 Hyperlipidemia, unspecified: Secondary | ICD-10-CM

## 2019-12-18 DIAGNOSIS — R0602 Shortness of breath: Secondary | ICD-10-CM | POA: Diagnosis not present

## 2019-12-18 DIAGNOSIS — I208 Other forms of angina pectoris: Secondary | ICD-10-CM | POA: Diagnosis not present

## 2019-12-18 DIAGNOSIS — R0600 Dyspnea, unspecified: Secondary | ICD-10-CM | POA: Diagnosis not present

## 2019-12-23 DIAGNOSIS — M419 Scoliosis, unspecified: Secondary | ICD-10-CM | POA: Diagnosis not present

## 2019-12-23 DIAGNOSIS — R0600 Dyspnea, unspecified: Secondary | ICD-10-CM | POA: Diagnosis not present

## 2019-12-23 DIAGNOSIS — E782 Mixed hyperlipidemia: Secondary | ICD-10-CM | POA: Diagnosis not present

## 2019-12-23 DIAGNOSIS — I10 Essential (primary) hypertension: Secondary | ICD-10-CM | POA: Diagnosis not present

## 2019-12-23 DIAGNOSIS — I208 Other forms of angina pectoris: Secondary | ICD-10-CM | POA: Diagnosis not present

## 2019-12-24 ENCOUNTER — Other Ambulatory Visit: Payer: Self-pay | Admitting: Specialist

## 2019-12-24 DIAGNOSIS — R0600 Dyspnea, unspecified: Secondary | ICD-10-CM | POA: Diagnosis not present

## 2019-12-24 DIAGNOSIS — R942 Abnormal results of pulmonary function studies: Secondary | ICD-10-CM | POA: Diagnosis not present

## 2019-12-24 DIAGNOSIS — M4104 Infantile idiopathic scoliosis, thoracic region: Secondary | ICD-10-CM | POA: Diagnosis not present

## 2019-12-24 DIAGNOSIS — R0609 Other forms of dyspnea: Secondary | ICD-10-CM

## 2019-12-31 ENCOUNTER — Ambulatory Visit
Admission: RE | Admit: 2019-12-31 | Discharge: 2019-12-31 | Disposition: A | Discharge status: Home / Self Care | Payer: Medicare Other | Source: Ambulatory Visit | Attending: Specialist | Admitting: Specialist

## 2019-12-31 ENCOUNTER — Other Ambulatory Visit: Payer: Self-pay

## 2019-12-31 DIAGNOSIS — R942 Abnormal results of pulmonary function studies: Secondary | ICD-10-CM | POA: Diagnosis not present

## 2019-12-31 DIAGNOSIS — R0609 Other forms of dyspnea: Secondary | ICD-10-CM

## 2019-12-31 DIAGNOSIS — R0600 Dyspnea, unspecified: Secondary | ICD-10-CM | POA: Insufficient documentation

## 2019-12-31 DIAGNOSIS — R0602 Shortness of breath: Secondary | ICD-10-CM | POA: Diagnosis not present

## 2020-01-15 ENCOUNTER — Other Ambulatory Visit: Payer: Self-pay

## 2020-01-15 ENCOUNTER — Ambulatory Visit (INDEPENDENT_AMBULATORY_CARE_PROVIDER_SITE_OTHER): Payer: Medicare Other | Admitting: Family Medicine

## 2020-01-15 DIAGNOSIS — N3000 Acute cystitis without hematuria: Secondary | ICD-10-CM

## 2020-01-15 DIAGNOSIS — J209 Acute bronchitis, unspecified: Secondary | ICD-10-CM | POA: Diagnosis not present

## 2020-01-15 MED ORDER — BENZONATATE 100 MG PO CAPS: 100.0000 mg | ORAL_CAPSULE | Freq: Three times a day (TID) | ORAL | 0 refills | Status: DC | PRN

## 2020-01-15 MED ORDER — AMOXICILLIN-POT CLAVULANATE 875-125 MG PO TABS: 1.0000 | ORAL_TABLET | Freq: Two times a day (BID) | ORAL | 0 refills | Status: DC

## 2020-01-15 NOTE — Progress Notes (Signed)
Virtual Visit via Telephone The purpose of this virtual visit is to provide medical care while limiting exposure to the novel coronavirus (COVID19) for both patient and office staff.  Consent was obtained for phone visit:  Yes.   Answered questions that patient had about telehealth interaction:  Yes.   I discussed the limitations, risks, security and privacy concerns of performing an evaluation and management service by telephone. I also discussed with the patient that there may be a patient responsible charge related to this service. The patient expressed understanding and agreed to proceed.  Patient Location: Home Provider Location: [facility])   ---------------------------------------------------------------------- Chief Complaint  Patient presents with  . Cough    chest congestion, HA onset week   . Urinary Tract Infection    onset week     S: Reviewed CMA documentation. I have called patient and gathered additional HPI as follows:  Cough / Chest Congestion / Headache Reports she has had dyspnea on exertion for period of time, she had consulted with Cardiology and then Pulmonology Jefm Bryant Dr Raul Del) on 12/24/19, she had work up including CT Scan Chest did not show COPD or other cause of symptoms. There was thought of some restrictive lung problem with scoliosis. Took OTC cough / congestion tablets, did not help - She has been isolated at home in past 2 weeks. Trying to avoid pollen allergies as well - She is fully COVID19 vaccinated since February 2021  Dysuria / UTI Reports onset 1 week burning with urination. Some cloudy urine. No blood in urine. Similar to prior UTI. None recently.  Denies any known or suspected exposure to person with or possibly with COVID19.  Denies any fevers, chills, sweats, body ache, sinus pain, , abdominal pain, diarrhea  -------------------------------------------------------------------------- O: No physical exam  performed due to remote telephone encounter.  I have personally reviewed the radiology report from 12/31/19 CT Chest.  CT CHEST WO CONTRAST [id number] Resulted: 12/31/19 1047  Order Status: Completed Updated: 12/31/19 1049  Narrative:   CLINICAL DATA: Worsening shortness of breath on exertion for 8-9  months.   EXAM:  CT CHEST WITHOUT CONTRAST   TECHNIQUE:  Multidetector CT imaging of the chest was performed following the  standard protocol without IV contrast.   COMPARISON: None.   FINDINGS:  Cardiovascular: Atherosclerotic calcification of the aorta, aortic  valve and coronary arteries. Right and left pulmonary arteries are  enlarged. Heart size within normal limits. No pericardial effusion.   Mediastinum/Nodes: Low-attenuation nodules in the thyroid measure up  to 2.3 cm on the right. No pathologically enlarged mediastinal or  axillary lymph nodes. Hilar regions are difficult to definitively  evaluate without IV contrast. Esophagus is grossly unremarkable.  Large hiatal hernia.   Lungs/Pleura: Biapical pleuroparenchymal scarring. Minimal linear  subpleural scarring in the lung bases. Scattered subpleural nodules  measure 4 mm or less in size. Subpleural scarring in the anterior  lingula. No pleural fluid. Airway is unremarkable.   Upper Abdomen: Visualized portion of the liver is unremarkable.  Stones are seen in the gallbladder. Adrenal glands are unremarkable.  Punctate stones versus renal vascular calcifications on the left.  Visualized portions of the spleen, pancreas, stomach and bowel are  otherwise unremarkable. No upper abdominal adenopathy.   Musculoskeletal: Severe scoliosis with degenerative disc disease. No  worrisome lytic or sclerotic lesions.   IMPRESSION:  1. No pulmonary parenchymal findings to explain the patient's  shortness of breath.  2. Scattered subpleural nodules measure 4 mm or less  in size and are  likely benign. No follow-up needed if  patient is low-risk (and has  no known or suspected primary neoplasm). Non-contrast chest CT can  be considered in 12 months if patient is high-risk. This  recommendation follows the consensus statement: Guidelines for  Management of Incidental Pulmonary Nodules Detected on CT Images:  From the Fleischner Society 2017; Radiology 2017; 284:228-243.  3. Cholelithiasis.  4. Punctate stones versus renal vascular calcifications in the left  kidney.  5. Thyroid nodules. Recommend thyroid US (ref: J Am Coll Radiol.  2015 Feb;12(2): 143-50).  6. Large hiatal hernia.  7. Aortic atherosclerosis (ICD10-I70.0). Coronary artery  calcification.  8. Enlarged pulmonary arteries, indicative of pulmonary arterial  hypertension.    Electronically Signed  By: Lorin Picket M.D.  On: 12/31/2019 10:47      -------------------------------------------------------------------------- A&P:  Suspected Acute Bronchitis vs Sinusitis, possible for benign viral etiology at onset - now concern with progression of symptoms, consider 2nd sickening and cannot rule out bacterial infection. - Reassuring without high risk symptoms - Afebrile, without dyspnea  UTD on COVID19 vaccine 10/2019  UTI as well without complication. Based on history  1. Start empiric Augmentin antibiotic 1 pill twice a day for 10 days for both bronchitis/UTI 2. Start Tessalon Perls take 1 capsule up to 3 times a day as needed for cough 3. Use existing nasal spray as needed for congestion 4. Supportive care, hydration, oral decongestant PRN if need 5. No confirmed COPD on CT - therefore, will not add prednisone at this time, she can f/u with Pulmonology if need  Meds ordered this encounter  Medications  . amoxicillin-clavulanate (AUGMENTIN) 875-125 MG tablet    Sig: Take 1 tablet by mouth 2 (two) times daily.    Dispense:  20 tablet    Refill:  0  . benzonatate (TESSALON) 100 MG capsule    Sig: Take 1 capsule (100 mg total) by  mouth 3 (three) times daily as needed for cough.    Dispense:  30 capsule    Refill:  0   OPTIONAL RECOMMENDED self quarantine for patient safety for PREVENTION ONLY. It is not required based on current clinical symptoms. If they were to develop fever or worsening shortness of breath, then emphasis on REQUIRED quarantine for up to 7-14 days that could be resolved if fever free >3 days AND if symptoms improving after 7 days.   If symptoms do not resolve or significantly improve OR if WORSENING - fever / cough - or worsening shortness of breath - then should contact us and seek advice on next steps in treatment at home vs where/when to seek care at Urgent Care or Hospital ED for further intervention and possible testing if indicated.  Patient verbalizes understanding with the above medical recommendations including the limitation of remote medical advice.  Specific follow-up / call-back criteria were given for patient to follow-up or seek medical care more urgently if needed.   - Time spent in direct consultation with patient on phone: 9 minutes  Nobie Putnam, [facility] 01/15/2020, 8:48 AM

## 2020-01-26 DIAGNOSIS — R918 Other nonspecific abnormal finding of lung field: Secondary | ICD-10-CM | POA: Diagnosis not present

## 2020-01-26 DIAGNOSIS — R05 Cough: Secondary | ICD-10-CM | POA: Diagnosis not present

## 2020-01-26 DIAGNOSIS — R0600 Dyspnea, unspecified: Secondary | ICD-10-CM | POA: Diagnosis not present

## 2020-01-26 DIAGNOSIS — Z03818 Encounter for observation for suspected exposure to other biological agents ruled out: Secondary | ICD-10-CM | POA: Diagnosis not present

## 2020-01-26 DIAGNOSIS — Z01818 Encounter for other preprocedural examination: Secondary | ICD-10-CM | POA: Diagnosis not present

## 2020-01-27 ENCOUNTER — Other Ambulatory Visit: Payer: Self-pay | Admitting: Family Medicine

## 2020-01-27 DIAGNOSIS — I10 Essential (primary) hypertension: Secondary | ICD-10-CM

## 2020-01-27 MED ORDER — AMLODIPINE BESYLATE 5 MG PO TABS: ORAL_TABLET | ORAL | 1 refills | Status: DC

## 2020-01-27 NOTE — Telephone Encounter (Signed)
[place] with [facility] is calling for the pt to request a 90 day amLODipine (NORVASC) 5 MG tablet   [place], [place] [employer] Phone:  [phone]  Fax:  [phone]

## 2020-01-27 NOTE — Telephone Encounter (Signed)
Requested Prescriptions  Pending Prescriptions Disp Refills  . amLODipine (NORVASC) 5 MG tablet 90 tablet 1    Sig: TAKE 1 TABLET(5 MG) BY MOUTH DAILY     Cardiovascular:  Calcium Channel Blockers Passed - 01/27/2020 12:50 PM      Passed - Last BP in normal range    BP Readings from Last 1 Encounters:  11/14/19 121/64         Passed - Valid encounter within last 6 months    Recent Outpatient Visits          1 week ago Acute bronchitis, unspecified organism   [facility] [place], Devonne Doughty, DO   2 months ago Pre-diabetes   [place], DO   8 months ago Annual physical exam   [facility] Olin Hauser, DO   8 months ago [place], DO   1 year ago Elevated hemoglobin A1c   [place], Devonne Doughty, [place]

## 2020-03-04 DIAGNOSIS — L720 Epidermal cyst: Secondary | ICD-10-CM | POA: Diagnosis not present

## 2020-03-04 DIAGNOSIS — Z859 Personal history of malignant neoplasm, unspecified: Secondary | ICD-10-CM | POA: Diagnosis not present

## 2020-03-04 DIAGNOSIS — Z872 Personal history of diseases of the skin and subcutaneous tissue: Secondary | ICD-10-CM | POA: Diagnosis not present

## 2020-03-04 DIAGNOSIS — L821 Other seborrheic keratosis: Secondary | ICD-10-CM | POA: Diagnosis not present

## 2020-03-04 DIAGNOSIS — L578 Other skin changes due to chronic exposure to nonionizing radiation: Secondary | ICD-10-CM | POA: Diagnosis not present

## 2020-05-03 DIAGNOSIS — Z01818 Encounter for other preprocedural examination: Secondary | ICD-10-CM | POA: Diagnosis not present

## 2020-05-03 DIAGNOSIS — M4134 Thoracogenic scoliosis, thoracic region: Secondary | ICD-10-CM | POA: Diagnosis not present

## 2020-05-03 DIAGNOSIS — R0600 Dyspnea, unspecified: Secondary | ICD-10-CM | POA: Diagnosis not present

## 2020-05-03 DIAGNOSIS — R942 Abnormal results of pulmonary function studies: Secondary | ICD-10-CM | POA: Diagnosis not present

## 2020-05-10 ENCOUNTER — Other Ambulatory Visit: Payer: Self-pay

## 2020-05-10 ENCOUNTER — Other Ambulatory Visit: Payer: Medicare Other

## 2020-05-10 DIAGNOSIS — R7303 Prediabetes: Secondary | ICD-10-CM

## 2020-05-10 DIAGNOSIS — D509 Iron deficiency anemia, unspecified: Secondary | ICD-10-CM | POA: Diagnosis not present

## 2020-05-10 DIAGNOSIS — Z0000 Encounter for general adult medical examination without abnormal findings: Secondary | ICD-10-CM

## 2020-05-10 DIAGNOSIS — E782 Mixed hyperlipidemia: Secondary | ICD-10-CM

## 2020-05-10 DIAGNOSIS — I10 Essential (primary) hypertension: Secondary | ICD-10-CM

## 2020-05-11 LAB — CBC WITH DIFFERENTIAL/PLATELET
Absolute Monocytes: 403 cells/uL (ref 200–950)
Basophils Absolute: 32 cells/uL (ref 0–200)
Basophils Relative: 0.9 %
Eosinophils Absolute: 112 cells/uL (ref 15–500)
Eosinophils Relative: 3.2 %
HCT: 36.6 % (ref 35.0–45.0)
Hemoglobin: 12 g/dL (ref 11.7–15.5)
Lymphs Abs: 837 cells/uL — ABNORMAL LOW (ref 850–3900)
MCH: 33.9 pg — ABNORMAL HIGH (ref 27.0–33.0)
MCHC: 32.8 g/dL (ref 32.0–36.0)
MCV: 103.4 fL — ABNORMAL HIGH (ref 80.0–100.0)
MPV: 11.1 fL (ref 7.5–12.5)
Monocytes Relative: 11.5 %
Neutro Abs: 2118 cells/uL (ref 1500–7800)
Neutrophils Relative %: 60.5 %
Platelets: 169 10*3/uL (ref 140–400)
RBC: 3.54 10*6/uL — ABNORMAL LOW (ref 3.80–5.10)
RDW: 11.7 % (ref 11.0–15.0)
Total Lymphocyte: 23.9 %
WBC: 3.5 10*3/uL — ABNORMAL LOW (ref 3.8–10.8)

## 2020-05-11 LAB — COMPLETE METABOLIC PANEL WITH GFR
AG Ratio: 1.7 (calc) (ref 1.0–2.5)
ALT: 10 U/L (ref 6–29)
AST: 16 U/L (ref 10–35)
Albumin: 4 g/dL (ref 3.6–5.1)
Alkaline phosphatase (APISO): 27 U/L — ABNORMAL LOW (ref 37–153)
BUN: 17 mg/dL (ref 7–25)
CO2: 28 mmol/L (ref 20–32)
Calcium: 9.5 mg/dL (ref 8.6–10.4)
Chloride: 102 mmol/L (ref 98–110)
Creat: 0.64 mg/dL (ref 0.60–0.93)
GFR, Est African American: 98 mL/min/{1.73_m2} (ref >=60)
GFR, Est Non African American: 85 mL/min/{1.73_m2} (ref >=60)
Globulin: 2.3 g/dL (calc) (ref 1.9–3.7)
Glucose, Bld: 94 mg/dL (ref 65–99)
Potassium: 4.5 mmol/L (ref 3.5–5.3)
Sodium: 137 mmol/L (ref 135–146)
Total Bilirubin: 0.6 mg/dL (ref 0.2–1.2)
Total Protein: 6.3 g/dL (ref 6.1–8.1)

## 2020-05-11 LAB — HEMOGLOBIN A1C
Hgb A1c MFr Bld: 5.7 % of total Hgb — ABNORMAL HIGH (ref <5.7)
Mean Plasma Glucose: 117 (calc)
eAG (mmol/L): 6.5 (calc)

## 2020-05-11 LAB — LIPID PANEL
Cholesterol: 173 mg/dL (ref <200)
HDL: 75 mg/dL (ref >=50)
LDL Cholesterol (Calc): 84 mg/dL (calc)
Non-HDL Cholesterol (Calc): 98 mg/dL (calc) (ref <130)
Total CHOL/HDL Ratio: 2.3 (calc) (ref <5.0)
Triglycerides: 62 mg/dL (ref <150)

## 2020-05-11 LAB — TSH: TSH: 1.51 mIU/L (ref 0.40–4.50)

## 2020-05-17 ENCOUNTER — Other Ambulatory Visit: Payer: Self-pay | Admitting: Family Medicine

## 2020-05-17 ENCOUNTER — Other Ambulatory Visit: Payer: Self-pay

## 2020-05-17 ENCOUNTER — Encounter: Payer: Self-pay | Admitting: Family Medicine

## 2020-05-17 ENCOUNTER — Ambulatory Visit (INDEPENDENT_AMBULATORY_CARE_PROVIDER_SITE_OTHER): Payer: Medicare Other | Admitting: Family Medicine

## 2020-05-17 VITALS — BP 113/54 | HR 59 | Temp 97.7°F | Resp 16 | Ht 64.0 in | Wt 139.0 lb

## 2020-05-17 DIAGNOSIS — E782 Mixed hyperlipidemia: Secondary | ICD-10-CM | POA: Diagnosis not present

## 2020-05-17 DIAGNOSIS — I10 Essential (primary) hypertension: Secondary | ICD-10-CM | POA: Diagnosis not present

## 2020-05-17 DIAGNOSIS — D509 Iron deficiency anemia, unspecified: Secondary | ICD-10-CM

## 2020-05-17 DIAGNOSIS — R7303 Prediabetes: Secondary | ICD-10-CM

## 2020-05-17 DIAGNOSIS — Z0000 Encounter for general adult medical examination without abnormal findings: Secondary | ICD-10-CM

## 2020-05-17 DIAGNOSIS — H9113 Presbycusis, bilateral: Secondary | ICD-10-CM

## 2020-05-17 DIAGNOSIS — M810 Age-related osteoporosis without current pathological fracture: Secondary | ICD-10-CM

## 2020-05-17 DIAGNOSIS — Z1159 Encounter for screening for other viral diseases: Secondary | ICD-10-CM

## 2020-05-17 DIAGNOSIS — M41124 Adolescent idiopathic scoliosis, thoracic region: Secondary | ICD-10-CM

## 2020-05-17 NOTE — Patient Instructions (Addendum)
Thank you for coming to the office today.  1. Chemistry - Normal results, including electrolytes, kidney and liver function. Normal fasting blood sugar   2. Hemoglobin A1c (Diabetes screening) - 5.7, slightly in range of Pre-Diabetes (>5.7 to 6.4) - stable from 5.6 to 5.9  3. TSH Thyroid Function Tests - Normal.  4. Cholesterol - Controlled on Pravastatin 20mg   5. CBC Blood Counts - Mild low WBC and RBC but normal Hgb  ------------  Please schedule and return for a NURSE ONLY VISIT for VACCINE - Approximately around Sept / Oct - Need High Dose Flu Vaccine  -----------------  When you hear the breaking news that eligible for COVID19 vaccine booster - recommend checking with [facility] or [facility] or [place] website or can notify us if you need help.  ----------------------------------   Please schedule a Follow-up Appointment to: Return in about 1 year (around 05/17/2021) for Annual Physical.  If you have any other questions or concerns, please feel free to call the office or send a message through [place]. You may also schedule an earlier appointment if necessary.  Additionally, you may be receiving a survey about your experience at our office within a few days to 1 week by e-mail or mail. We value your feedback.  Nobie Putnam, DO [place]

## 2020-05-17 NOTE — Assessment & Plan Note (Signed)
Mild elevated A1c at 5.7 Attributed to diet No other significant risk factors  Plan:  1. Not on any therapy currently  2. Encourage improved lifestyle - reviewed basics of low carb, low sugar diet, continue improving regular exercise

## 2020-05-17 NOTE — Assessment & Plan Note (Signed)
Well-controlled HTN - Home BP readings normal  No known complications   Plan:  1. Continue current BP regimen Losartan-HCTZ 100-25mg daily, Metoprolol 25mg BID, Amlodipine 5mg daily 2. Encourage improved lifestyle - low sodium diet, regular exercise 3.  Continue monitor BP outside office, bring readings to next visit, if persistently >140/90 or new symptoms notify office sooner 

## 2020-05-17 NOTE — Assessment & Plan Note (Signed)
Stable without fracture Followed by [facility] Dr Gabriel Carina On Prolia, 1st injection 10/2017 Due for updated DEXA

## 2020-05-17 NOTE — Assessment & Plan Note (Signed)
Improved Hearing aids bilateral

## 2020-05-17 NOTE — Assessment & Plan Note (Signed)
Chronic problem since adolescent, thoracic/lumbar Previously followed with Back / Scoliosis specialist in [facility]  Suspected underlying cause of her dyspnea thought to be secondary to lung restriction with scoliosis, followed by [facility] / Cardiology now

## 2020-05-17 NOTE — Progress Notes (Signed)
Subjective:    Patient ID: Rebecca Mitchell, female    DOB: [birth year], 79 y.o.   MRN: [id number]  Rebecca Mitchell is a 79 y.o. female presenting on 05/17/2020 for Annual Exam   HPI   Here for Annual Physical and lab Review.  Elevated A1c Previous elevated A1c 5.6 to 5.9 Last lab result 5.7, with overall stable results. CBGs:Not checking, not indicated Meds:Never on meds Currently on ARB Lifestyle: - Diet (Improving her balanceddiet less sweets and reduce baked goods, not adding as much sugar to sweet tea, more water now - she is still working on this) - Exercise (less regular exercise now) Denies hypoglycemia  CHRONIC HTN: Reportsno new concerns. Checks BP at home normalrange Current Meds -Amlodipine 64m daily, Losartan-HCTZ 100-219mdaily, Metoprolol 2561mID Reports good compliance, took meds today. Tolerating well, w/o complaints.  Chronic Scoliosis / ChronicBack Pain - Reports chronic problem with scoliosis since adolescent. Has been re-established with Dr Max[place]ennis [place] [facility] [place], muscle spasm or soreness. - She has concerns with breathing see below  Anemia On iron 3 x week, doing well, prior problem. Last lab 04/2020, was normal Hgb  History of Breast Cancer Mammogram surveillance per [facility] Previously on Letrozole  Severe Osteoporosis [facility] SolGabriel Carinaon prolia, followed with DEXA Monitoring - [facility] and has DEXA scheduled at that time  Dsypnea - likely secondary to Scoliosis Followed by Dr FleRaul DeleFeliz Beamhas had PFT breathing test, she has plenty of oxygen but still gets short of breath at times, she says likely due to her scoliosis, and will follow with them. She can stop and rest for a minute and keep active. Followed by Dr CalClayborn Bignesscardiac work up, evaluation of dyspnea, thought to be related to scoliosis  Reduced Hearing,  Bilateral Has hearing aid bilateral  Health Maintenance:  Due routine Hep C, check next year  Due return for Flu vaccine  UTD COVID19 vaccine   [facility] 05/17/2020 01/15/2020 06/17/2019  Decreased Interest 0 0 0  Down, Depressed, Hopeless 0 0 0  PHQ - 2 Score 0 0 0    Past Medical History:  Diagnosis Date  . Anemia   . [place]   left breast lumpectomy with rad tx  . Hyperlipidemia   . Malignant neoplasm of lower-inner quadrant of left female breast ([place]   5 mm,T1a, N0; ER/ PR positive, HER-2/neu negative  . Osteoporosis   . Personal history of radiation therapy 2016   left breast ca   Past Surgical History:  Procedure Laterality Date  . BREAST BIOPSY Left 08/2014   invasive mammary carcinoma  . BREAST LUMPECTOMY Left 10/01/2014   invasive mammary carcinoma and DCIS with clear margins  . BREAST SURGERY Left 10/01/2014   lumpectomy  . COLONOSCOPY  2010  . COLONOSCOPY WITH PROPOFOL N/A 09/21/2015   Procedure: COLONOSCOPY WITH PROPOFOL;  Surgeon: SeeChristene LyeD;  Location: [facility] ENDOSCOPY;  Service: Endoscopy;  Laterality: N/A;  . UPPER GI ENDOSCOPY  2014   Social History   Socioeconomic History  . Marital status: Married    Spouse name: Not on file  . Number of children: Not on file  . Years of education: Not on file  . Highest education level: Bachelor's degree (e.g., BA, AB, BS)  Occupational History  . Not on file  Tobacco Use  . Smoking status:  Never Smoker  . Smokeless tobacco: Never Used  Vaping Use  . Vaping Use: Never used  Substance and Sexual Activity  . Alcohol use: No    Alcohol/week: 0.0 standard drinks  . Drug use: No  . Sexual activity: Not on file  Other Topics Concern  . Not on file  Social History Narrative  . Not on file   Social Determinants of Health   Financial Resource Strain:   . Difficulty of Paying Living Expenses: Not on file  Food Insecurity:   . Worried About Paediatric nurse in the Last Year: Not on file  . Ran Out of Food in the Last Year: Not on file  Transportation Needs:   . Lack of Transportation (Medical): Not on file  . Lack of Transportation (Non-Medical): Not on file  Physical Activity:   . Days of Exercise per Week: Not on file  . Minutes of Exercise per Session: Not on file  Stress:   . Feeling of Stress : Not on file  Social Connections:   . Frequency of Communication with Friends and Family: Not on file  . Frequency of Social Gatherings with Friends and Family: Not on file  . Attends Religious Services: Not on file  . Active Member of Clubs or Organizations: Not on file  . Attends Archivist Meetings: Not on file  . Marital Status: Not on file  Intimate Partner Violence:   . Fear of Current or Ex-Partner: Not on file  . Emotionally Abused: Not on file  . Physically Abused: Not on file  . Sexually Abused: Not on file   Family History  Problem Relation Age of Onset  . Breast cancer Sister 47  . Heart disease Mother   . Heart disease Father    Current Outpatient Medications on File Prior to Visit  Medication Sig  . amLODipine (NORVASC) 5 MG tablet TAKE 1 TABLET(5 MG) BY MOUTH DAILY  . Ascorbic Acid (VITAMIN C) 1000 MG tablet Take 1,000 mg by mouth daily.  Marland Kitchen aspirin EC 81 MG tablet Take 81 mg by mouth daily.  . calcium carbonate 100 mg/ml SUSP Take by mouth.  . Cholecalciferol (VITAMIN D3) 10 MCG (400 UNIT) CAPS Take by mouth.  . co-enzyme Q-10 30 MG capsule Take 30 mg by mouth 3 (three) times daily.  Marland Kitchen denosumab (PROLIA) 60 MG/ML SOSY injection INJECT 1 ML( 60 MG) UNDER THE SKIN EVERY 6 MONTHS  . FERROUS SULFATE PO Take 1 tablet by mouth once a week.  . fluticasone (FLONASE) 50 MCG/ACT nasal spray USE 2 SPRAYS IN EACH NOSTRIL ONCE DAILY FOR UP TO 4 TO 6 WEEKS, THEN AS NEEDED FOR ALLERGIES  . letrozole (FEMARA) 2.5 MG tablet TAKE 1 TABLET BY MOUTH  DAILY  . loratadine (CLARITIN) 10 MG tablet Take 1 tablet (10 mg  total) by mouth daily. For up to 1 month, then as needed for seasonal allergies.  Marland Kitchen losartan-hydrochlorothiazide (HYZAAR) 100-25 MG tablet TAKE 1 TABLET BY MOUTH DAILY  . Lutein-Zeaxanthin 15-0.7 MG CAPS Take 1 capsule by mouth daily.  . metoprolol tartrate (LOPRESSOR) 25 MG tablet TAKE 1 TABLET(25 MG) BY MOUTH TWICE DAILY  . Multiple Vitamin (MULTIVITAMIN) tablet Take 1 tablet by mouth daily.  . Omega-3 Fatty Acids (FISH OIL PO) Take 1 capsule by mouth daily.  . pravastatin (PRAVACHOL) 20 MG tablet TAKE 1 TABLET BY MOUTH  DAILY  . Saccharomyces boulardii (PROBIOTIC) 250 MG CAPS Take 250 mg by mouth daily.  Marland Kitchen albuterol (  VENTOLIN HFA) 108 (90 Base) MCG/ACT inhaler    No current facility-administered medications on file prior to visit.    Review of Systems Per HPI unless specifically indicated above      Objective:    BP (!) 113/54   Pulse (!) 59   Temp 97.7 F (36.5 C) (Temporal)   Resp 16   Ht '5\' 4"'  (1.626 m)   Wt 139 lb (63 kg)   SpO2 99%   BMI 23.86 kg/m   Wt Readings from Last 3 Encounters:  05/17/20 139 lb (63 kg)  11/14/19 136 lb 9.6 oz (62 kg)  09/01/19 139 lb 8 oz (63.3 kg)    Physical Exam Vitals and nursing note reviewed.  Constitutional:      General: She is not in acute distress.    Appearance: She is well-developed. She is not diaphoretic.     Comments: Well-appearing, comfortable, cooperative, thin elderly female   HENT:     Head: Normocephalic and atraumatic.     Ears:     Comments: Reduced hearing, has hearing aids Eyes:     General:        Right eye: No discharge.        Left eye: No discharge.     Conjunctiva/sclera: Conjunctivae normal.     Pupils: Pupils are equal, round, and reactive to light.  Neck:     Thyroid: No thyromegaly.  Cardiovascular:     Rate and Rhythm: Normal rate and regular rhythm.     Heart sounds: Normal heart sounds. No murmur heard.   Pulmonary:     Effort: Pulmonary effort is normal. No respiratory distress.      Breath sounds: Normal breath sounds. No wheezing or rales.     Comments: Good air movement bilateral, R side some reduced Abdominal:     General: Bowel sounds are normal. There is no distension.     Palpations: Abdomen is soft. There is no mass.     Tenderness: There is no abdominal tenderness.  Musculoskeletal:        General: No tenderness. Normal range of motion.     Cervical back: Normal range of motion and neck supple.     Right lower leg: No edema.     Left lower leg: No edema.     Comments: Stable scoliosis with significant thoracic dextroscoliosis based on curve, non tender, no muscle spasm   Upper / Lower Extremities: - Normal muscle tone, strength bilateral upper extremities 5/5, lower extremities 5/5  Bilateral varicose veins, no swelling, non tender  Lymphadenopathy:     Cervical: No cervical adenopathy.  Skin:    General: Skin is warm and dry.     Findings: No erythema or rash.  Neurological:     Mental Status: She is alert and oriented to person, place, and time.     Comments: Distal sensation intact to light touch all extremities  Psychiatric:        Behavior: Behavior normal.     Comments: Well groomed, good eye contact, normal speech and thoughts      Results for orders placed or performed in visit on 05/10/20  TSH  Result Value Ref Range   TSH 1.51 0.40 - 4.50 mIU/L  Lipid panel  Result Value Ref Range   Cholesterol 173 <200 mg/dL   HDL 75 > OR = 50 mg/dL   Triglycerides 62 <150 mg/dL   LDL Cholesterol (Calc) 84 mg/dL (calc)   Total CHOL/HDL Ratio 2.3 <5.0 (calc)  Non-HDL Cholesterol (Calc) 98 <130 mg/dL (calc)  COMPLETE METABOLIC PANEL WITH GFR  Result Value Ref Range   Glucose, Bld 94 65 - 99 mg/dL   BUN 17 7 - 25 mg/dL   Creat 0.64 0.60 - 0.93 mg/dL   GFR, Est Non African American 85 > OR = 60 mL/min/1.71m   GFR, Est African American 98 > OR = 60 mL/min/1.79m  BUN/Creatinine Ratio NOT APPLICABLE 6 - 22 (calc)   Sodium 137 135 - 146 mmol/L    Potassium 4.5 3.5 - 5.3 mmol/L   Chloride 102 98 - 110 mmol/L   CO2 28 20 - 32 mmol/L   Calcium 9.5 8.6 - 10.4 mg/dL   Total Protein 6.3 6.1 - 8.1 g/dL   Albumin 4.0 3.6 - 5.1 g/dL   Globulin 2.3 1.9 - 3.7 g/dL (calc)   AG Ratio 1.7 1.0 - 2.5 (calc)   Total Bilirubin 0.6 0.2 - 1.2 mg/dL   Alkaline phosphatase (APISO) 27 (L) 37 - 153 U/L   AST 16 10 - 35 U/L   ALT 10 6 - 29 U/L  CBC with Differential/Platelet  Result Value Ref Range   WBC 3.5 (L) 3.8 - 10.8 Thousand/uL   RBC 3.54 (L) 3.80 - 5.10 Million/uL   Hemoglobin 12.0 11.7 - 15.5 g/dL   HCT 36.6 35 - 45 %   MCV 103.4 (H) 80.0 - 100.0 fL   MCH 33.9 (H) 27.0 - 33.0 pg   MCHC 32.8 32.0 - 36.0 g/dL   RDW 11.7 11.0 - 15.0 %   Platelets 169 140 - 400 Thousand/uL   MPV 11.1 7.5 - 12.5 fL   Neutro Abs 2,118 1,500 - 7,800 cells/uL   Lymphs Abs 837 (L) 850 - 3,900 cells/uL   Absolute Monocytes 403 200 - 950 cells/uL   Eosinophils Absolute 112 15 - 500 cells/uL   Basophils Absolute 32 0 - 200 cells/uL   Neutrophils Relative % 60.5 %   Total Lymphocyte 23.9 %   Monocytes Relative 11.5 %   Eosinophils Relative 3.2 %   Basophils Relative 0.9 %  Hemoglobin A1c  Result Value Ref Range   Hgb A1c MFr Bld 5.7 (H) <5.7 % of total Hgb   Mean Plasma Glucose 117 (calc)   eAG (mmol/L) 6.5 (calc)      Assessment & Plan:   Problem List Items Addressed This Visit    Scoliosis    Chronic problem since adolescent, thoracic/lumbar [place] dysp[place] thought to be secondary to lung restriction with scoliosis, [facility]      Presbycusis    Improved Hearing aids bilateral      Pre-diabetes    Mild elevated A1c at 5.7 Attributed to diet No other significant risk factors  Plan:  1. Not on any therapy currently  2. Encourage improved lifestyle - reviewed basics of low carb, low sugar diet, continue improving regular  exercise      OP (osteoporosis)    Stable without fracture [facility] SoGabriel Carinan Prolia, 1st injection 10/2017 Due for updated DEXA       HLD (hyperlipidemia)    Controlled cholesterol on statin and lifestyle Last lipid panel 04/2020  Plan: 1. Continue current meds - pravastatin 2085maily 2. Encourage improved lifestyle - low carb/cholesterol, reduce portion size, continue improving regular exercise      Essential hypertension    Well-controlled  HTN - Home BP readings normal  No known complications   Plan:  1. Continue current BP regimen Losartan-HCTZ 100-76m daily, Metoprolol 269mBID, Amlodipine 41m49maily 2. Encourage improved lifestyle - low sodium diet, regular exercise 3.  Continue monitor BP outside office, bring readings to next visit, if persistently >140/90 or new symptoms notify office sooner      Absolute anemia    Stable, normalized Hgb On PO OTC Iron intermittently       Other Visit Diagnoses    Annual physical exam    -  Primary      Updated Health Maintenance information - Return for high dose Flu Vaccine when ready Reviewed recent lab results with patient Encouraged improvement to lifestyle with diet and exercise Maintain weight   No orders of the defined types were placed in this encounter.   Follow up plan: Return in about 1 year (around 05/17/2021) for Annual Physical.   Future labs ordered for 05/17/21 add Hep C TSH next year  AleNobie PutnamO [place] 05/17/2020, 9:46 AM

## 2020-05-17 NOTE — Assessment & Plan Note (Signed)
Stable, normalized Hgb On PO OTC Iron intermittently

## 2020-05-17 NOTE — Assessment & Plan Note (Addendum)
Controlled cholesterol on statin and lifestyle Last lipid panel 04/2020  Plan: 1. Continue current meds - pravastatin 20mg  daily 2. Encourage improved lifestyle - low carb/cholesterol, reduce portion size, continue improving regular exercise

## 2020-05-26 ENCOUNTER — Other Ambulatory Visit: Payer: Self-pay | Admitting: Family Medicine

## 2020-05-26 DIAGNOSIS — I10 Essential (primary) hypertension: Secondary | ICD-10-CM

## 2020-05-26 NOTE — Telephone Encounter (Signed)
Requested Prescriptions  Pending Prescriptions Disp Refills  . metoprolol tartrate (LOPRESSOR) 25 MG tablet [Pharmacy Med Name: Metoprolol Tartrate 25 MG Oral Tablet] 180 tablet 1    Sig: TAKE 1 TABLET BY MOUTH  TWICE DAILY     Cardiovascular:  Beta Blockers Passed - 05/26/2020  6:37 AM      Passed - Last BP in normal range    BP Readings from Last 1 Encounters:  05/17/20 (!) 113/54         Passed - Last Heart Rate in normal range    Pulse Readings from Last 1 Encounters:  05/17/20 (!) 59         Passed - Valid encounter within last 6 months    Recent Outpatient Visits          1 week ago Annual physical exam   [place], [place]   4 months ago Acute bronchitis, unspecified organism   [facility] [place], Devonne Doughty, DO   6 months ago Pre-diabetes   [facility] [place], Devonne Doughty, DO   1 year ago Annual physical exam   [facility] Olin Hauser, DO   1 year ago [place], DO      Future Appointments            In 12 months [place], Devonne Doughty, [facility], [facility]

## 2020-06-07 ENCOUNTER — Ambulatory Visit (INDEPENDENT_AMBULATORY_CARE_PROVIDER_SITE_OTHER): Payer: Medicare Other | Admitting: Family Medicine

## 2020-06-07 ENCOUNTER — Encounter: Payer: Self-pay | Admitting: Family Medicine

## 2020-06-07 ENCOUNTER — Ambulatory Visit
Admission: RE | Admit: 2020-06-07 | Discharge: 2020-06-07 | Disposition: A | Discharge status: Home / Self Care | Payer: Medicare Other | Source: Ambulatory Visit | Attending: Family Medicine | Admitting: Family Medicine

## 2020-06-07 ENCOUNTER — Other Ambulatory Visit: Payer: Self-pay

## 2020-06-07 ENCOUNTER — Ambulatory Visit
Admission: RE | Admit: 2020-06-07 | Discharge: 2020-06-07 | Disposition: A | Discharge status: Home / Self Care | Payer: Medicare Other | Attending: Family Medicine | Admitting: Family Medicine

## 2020-06-07 VITALS — BP 115/55 | HR 79 | Resp 18

## 2020-06-07 DIAGNOSIS — M25562 Pain in left knee: Secondary | ICD-10-CM | POA: Diagnosis not present

## 2020-06-07 DIAGNOSIS — S82192A Other fracture of upper end of left tibia, initial encounter for closed fracture: Secondary | ICD-10-CM | POA: Diagnosis not present

## 2020-06-07 DIAGNOSIS — S82115A Nondisplaced fracture of left tibial spine, initial encounter for closed fracture: Secondary | ICD-10-CM | POA: Diagnosis not present

## 2020-06-07 DIAGNOSIS — S82102A Unspecified fracture of upper end of left tibia, initial encounter for closed fracture: Secondary | ICD-10-CM | POA: Insufficient documentation

## 2020-06-07 DIAGNOSIS — S82292A Other fracture of shaft of left tibia, initial encounter for closed fracture: Secondary | ICD-10-CM | POA: Diagnosis not present

## 2020-06-07 NOTE — Patient Instructions (Addendum)
We have taken xrays of your left knee.  We will give you a copy of your xrays to take with you.  It is important for you to proceed to [facility] in [place], [place] for further orthopedic evaluation/treatment.  You will receive a survey after today's visit either digitally by e-mail or paper by [employer]. Your experiences and feedback matter to [place].  Please respond so we know how we are doing as we provide care for you.  Call us with any questions/concerns/needs.  It is my goal to be available to you for your health concerns.  Thanks for choosing me to be a partner in your healthcare needs!  Harlin Rain, FNP-C Family Nurse Practitioner [facility] Phone: [phone]

## 2020-06-07 NOTE — Assessment & Plan Note (Signed)
See closed fracture of left proximal tibia AP

## 2020-06-07 NOTE — Progress Notes (Signed)
Subjective:    Patient ID: Rebecca Mitchell, female    DOB: 12/12/1940, 79 y.o.   MRN: [id number]  Rebecca Mitchell is a 79 y.o. female presenting on 06/07/2020 for Knee Pain (pt fell and injured her left knee stepping up on the curve x2 days. The pt fell and came down on both knees. She complains of intermittent pain that radiates from the Left knee down only when she applies pressure on the leg. Swollen, brusied with diffficutly bearing weight.)   HPI  Ms. Bernardy presents to clinic for left knee pain x 2 days.  Reports was stepping up onto the curb on 06/05/2020 and she missed the curb and landed on both of her knees.  Has bruised both of them but the left knee has been causing her pain with ambulation, has been using a wheelchair or a walker to get around at home, sleeping in her recliner.  Has increased swelling and ecchymosis to left knee.  Has not taken anything for her symptoms.  Denies any other injury/pain, hitting her head, or LOC.  Depression screen [facility] 2/9 05/17/2020 01/15/2020 06/17/2019  Decreased Interest 0 0 0  Down, Depressed, Hopeless 0 0 0  PHQ - 2 Score 0 0 0    Social History   Tobacco Use  . Smoking status: Never Smoker  . Smokeless tobacco: Never Used  Vaping Use  . Vaping Use: Never used  Substance Use Topics  . Alcohol use: No    Alcohol/week: 0.0 standard drinks  . Drug use: No    Review of Systems  Constitutional: Negative.   HENT: Negative.   Eyes: Negative.   Respiratory: Negative.   Cardiovascular: Negative.   Gastrointestinal: Negative.   Endocrine: Negative.   Genitourinary: Negative.   Musculoskeletal: Positive for arthralgias and joint swelling. Negative for back pain, neck pain and neck stiffness.  Skin: Negative.   Allergic/Immunologic: Negative.   Neurological: Negative.   Hematological: Negative.   Psychiatric/Behavioral: Negative.    Per HPI unless specifically indicated above     Objective:    BP (!) 115/55 (BP Location: Left Arm, Patient  Position: Sitting, Cuff Size: Normal)   Pulse 79   Resp 18   SpO2 98%   Wt Readings from Last 3 Encounters:  05/17/20 139 lb (63 kg)  11/14/19 136 lb 9.6 oz (62 kg)  09/01/19 139 lb 8 oz (63.3 kg)    Physical Exam Vitals reviewed.  Constitutional:      General: She is not in acute distress.    Appearance: Normal appearance. She is well-developed and well-groomed. She is not ill-appearing or toxic-appearing.  HENT:     Head: Normocephalic and atraumatic.     Nose:     Comments: Lizbeth Bark is in place, covering mouth and nose. Eyes:     General: Lids are normal. Vision grossly intact.        Right eye: No discharge.        Left eye: No discharge.     Extraocular Movements: Extraocular movements intact.     Conjunctiva/sclera: Conjunctivae normal.     Pupils: Pupils are equal, round, and reactive to light.  Cardiovascular:     Rate and Rhythm: Normal rate and regular rhythm.     Pulses: Normal pulses.          Dorsalis pedis pulses are 2+ on the right side and 2+ on the left side.     Heart sounds: Normal heart sounds. No murmur heard.  No  friction rub. No gallop.   Pulmonary:     Effort: Pulmonary effort is normal. No respiratory distress.     Breath sounds: Normal breath sounds.  Musculoskeletal:        General: Swelling and tenderness present.     Right knee: Swelling and ecchymosis present. Normal range of motion. No tenderness. Normal pulse.     Left knee: Swelling and ecchymosis present. Decreased range of motion. Tenderness present. Normal pulse.     Right lower leg: No swelling or tenderness. No edema.     Left lower leg: No swelling or tenderness. No edema.     Comments: Left knee with ecchymosis and swelling throughout.  Unable to perform ROM exercises due to swelling and pain with any ROM.    Skin:    General: Skin is warm and dry.     Capillary Refill: Capillary refill takes less than 2 seconds.  Neurological:     General: No focal deficit present.     Mental  Status: She is alert and oriented to person, place, and time.  Psychiatric:        Attention and Perception: Attention and perception normal.        Mood and Affect: Mood and affect normal.        Speech: Speech normal.        Behavior: Behavior normal. Behavior is cooperative.        Thought Content: Thought content normal.        Cognition and Memory: Cognition and memory normal.        Judgment: Judgment normal.    Results for orders placed or performed in visit on 05/10/20  TSH  Result Value Ref Range   TSH 1.51 0.40 - 4.50 mIU/L  Lipid panel  Result Value Ref Range   Cholesterol 173 <200 mg/dL   HDL 75 > OR = 50 mg/dL   Triglycerides 62 <150 mg/dL   LDL Cholesterol (Calc) 84 mg/dL (calc)   Total CHOL/HDL Ratio 2.3 <5.0 (calc)   Non-HDL Cholesterol (Calc) 98 <130 mg/dL (calc)  COMPLETE METABOLIC PANEL WITH GFR  Result Value Ref Range   Glucose, Bld 94 65 - 99 mg/dL   BUN 17 7 - 25 mg/dL   Creat 0.64 0.60 - 0.93 mg/dL   GFR, Est Non African American 85 > OR = 60 mL/min/1.43m2   GFR, Est African American 98 > OR = 60 mL/min/1.48m2   BUN/Creatinine Ratio NOT APPLICABLE 6 - 22 (calc)   Sodium 137 135 - 146 mmol/L   Potassium 4.5 3.5 - 5.3 mmol/L   Chloride 102 98 - 110 mmol/L   CO2 28 20 - 32 mmol/L   Calcium 9.5 8.6 - 10.4 mg/dL   Total Protein 6.3 6.1 - 8.1 g/dL   Albumin 4.0 3.6 - 5.1 g/dL   Globulin 2.3 1.9 - 3.7 g/dL (calc)   AG Ratio 1.7 1.0 - 2.5 (calc)   Total Bilirubin 0.6 0.2 - 1.2 mg/dL   Alkaline phosphatase (APISO) 27 (L) 37 - 153 U/L   AST 16 10 - 35 U/L   ALT 10 6 - 29 U/L  CBC with Differential/Platelet  Result Value Ref Range   WBC 3.5 (L) 3.8 - 10.8 Thousand/uL   RBC 3.54 (L) 3.80 - 5.10 Million/uL   Hemoglobin 12.0 11.7 - 15.5 g/dL   HCT 36.6 35 - 45 %   MCV 103.4 (H) 80.0 - 100.0 fL   MCH 33.9 (H) 27.0 - 33.0 pg  MCHC 32.8 32.0 - 36.0 g/dL   RDW 11.7 11.0 - 15.0 %   Platelets 169 140 - 400 Thousand/uL   MPV 11.1 7.5 - 12.5 fL   Neutro Abs  2,118 1,500 - 7,800 cells/uL   Lymphs Abs 837 (L) 850 - 3,900 cells/uL   Absolute Monocytes 403 200 - 950 cells/uL   Eosinophils Absolute 112 15 - 500 cells/uL   Basophils Absolute 32 0 - 200 cells/uL   Neutrophils Relative % 60.5 %   Total Lymphocyte 23.9 %   Monocytes Relative 11.5 %   Eosinophils Relative 3.2 %   Basophils Relative 0.9 %  Hemoglobin A1c  Result Value Ref Range   Hgb A1c MFr Bld 5.7 (H) <5.7 % of total Hgb   Mean Plasma Glucose 117 (calc)   eAG (mmol/L) 6.5 (calc)      Assessment & Plan:   Problem List Items Addressed This Visit      Musculoskeletal and Integument   Closed fracture of left proximal tibia - Primary    Left proximal tibia with a closed fracture.  Xray images printed and provided to patient, instructed to proceed to [facility] for further treatment plan.  Patient verbalized understanding.  Plan: 1. Xray images provided to patient 2. To proceed to [facility] for further treatment        Other   Acute pain of left knee    See closed fracture of left proximal tibia AP      Relevant Orders   DG Knee Complete 4 Views Left      No orders of the defined types were placed in this encounter.  Follow up plan: Return if symptoms worsen or fail to improve.   Harlin Rain, [place] Family Nurse Practitioner [facility] 06/07/2020, 5:05 PM

## 2020-06-07 NOTE — Assessment & Plan Note (Signed)
Left proximal tibia with a closed fracture.  Xray images printed and provided to patient, instructed to proceed to [facility] for further treatment plan.  Patient verbalized understanding.  Plan: 1. Xray images provided to patient 2. To proceed to [facility] for further treatment

## 2020-06-08 ENCOUNTER — Telehealth: Payer: Self-pay | Admitting: *Deleted

## 2020-06-08 NOTE — Telephone Encounter (Signed)
Diane from [facility] radiology- call report on Knee X-ray from 9/20. Result is in and can be viewed in Epic- office notified.

## 2020-06-14 ENCOUNTER — Other Ambulatory Visit: Payer: Self-pay | Admitting: Orthopedic Surgery

## 2020-06-14 DIAGNOSIS — S82115A Nondisplaced fracture of left tibial spine, initial encounter for closed fracture: Secondary | ICD-10-CM

## 2020-06-15 DIAGNOSIS — M810 Age-related osteoporosis without current pathological fracture: Secondary | ICD-10-CM | POA: Diagnosis not present

## 2020-06-18 ENCOUNTER — Encounter (INDEPENDENT_AMBULATORY_CARE_PROVIDER_SITE_OTHER): Payer: Self-pay

## 2020-06-18 ENCOUNTER — Other Ambulatory Visit: Payer: Self-pay

## 2020-06-18 ENCOUNTER — Ambulatory Visit
Admission: RE | Admit: 2020-06-18 | Discharge: 2020-06-18 | Disposition: A | Discharge status: Home / Self Care | Payer: Medicare Other | Source: Ambulatory Visit | Attending: Orthopedic Surgery | Admitting: Orthopedic Surgery

## 2020-06-18 DIAGNOSIS — S82202A Unspecified fracture of shaft of left tibia, initial encounter for closed fracture: Secondary | ICD-10-CM | POA: Diagnosis not present

## 2020-06-18 DIAGNOSIS — S82115A Nondisplaced fracture of left tibial spine, initial encounter for closed fracture: Secondary | ICD-10-CM | POA: Insufficient documentation

## 2020-06-18 DIAGNOSIS — S8255XA Nondisplaced fracture of medial malleolus of left tibia, initial encounter for closed fracture: Secondary | ICD-10-CM | POA: Diagnosis not present

## 2020-06-23 DIAGNOSIS — S82115A Nondisplaced fracture of left tibial spine, initial encounter for closed fracture: Secondary | ICD-10-CM | POA: Diagnosis not present

## 2020-06-28 ENCOUNTER — Other Ambulatory Visit: Payer: Self-pay | Admitting: Family Medicine

## 2020-06-28 DIAGNOSIS — I10 Essential (primary) hypertension: Secondary | ICD-10-CM

## 2020-07-06 ENCOUNTER — Other Ambulatory Visit: Payer: Self-pay

## 2020-07-06 ENCOUNTER — Ambulatory Visit (INDEPENDENT_AMBULATORY_CARE_PROVIDER_SITE_OTHER): Payer: Medicare Other

## 2020-07-06 DIAGNOSIS — Z23 Encounter for immunization: Secondary | ICD-10-CM

## 2020-07-06 NOTE — Progress Notes (Signed)
High

## 2020-07-13 DIAGNOSIS — S82115A Nondisplaced fracture of left tibial spine, initial encounter for closed fracture: Secondary | ICD-10-CM | POA: Diagnosis not present

## 2020-07-26 ENCOUNTER — Other Ambulatory Visit: Payer: Self-pay | Admitting: Family Medicine

## 2020-07-26 DIAGNOSIS — I10 Essential (primary) hypertension: Secondary | ICD-10-CM

## 2020-07-26 NOTE — Telephone Encounter (Signed)
Requested Prescriptions  Pending Prescriptions Disp Refills   losartan-hydrochlorothiazide (HYZAAR) 100-25 MG tablet [Pharmacy Med Name: Losartan Potassium-HCTZ 100-25 MG Oral Tablet] 90 tablet 1    Sig: TAKE 1 TABLET BY MOUTH  DAILY     Cardiovascular: ARB + Diuretic Combos Passed - 07/26/2020 12:07 PM      Passed - K in normal range and within 180 days    Potassium  Date Value Ref Range Status  05/10/2020 4.5 3.5 - 5.3 mmol/L Final  09/21/2014 4.1 3.5 - 5.1 mmol/L Final         Passed - Na in normal range and within 180 days    Sodium  Date Value Ref Range Status  05/10/2020 137 135 - 146 mmol/L Final  02/08/2016 142 134 - 144 mmol/L Final         Passed - Cr in normal range and within 180 days    Creat  Date Value Ref Range Status  05/10/2020 0.64 0.60 - 0.93 mg/dL Final    Comment:    For patients >82 years of age, the reference limit for Creatinine is approximately 13% higher for people identified as African-American. .          Passed - Ca in normal range and within 180 days    Calcium  Date Value Ref Range Status  05/10/2020 9.5 8.6 - 10.4 mg/dL Final         Passed - Patient is not pregnant      Passed - Last BP in normal range    BP Readings from Last 1 Encounters:  06/07/20 (!) 115/55         Passed - Valid encounter within last 6 months    Recent Outpatient Visits          1 month ago Other closed fracture of proximal end of left tibia, initial encounter   [place], FNP   2 months ago Annual physical exam   [facility] Olin Hauser, DO   6 months ago Acute bronchitis, unspecified organism   [facility] [place], Devonne Doughty, DO   8 months ago Pre-diabetes   [facility] [place], Devonne Doughty, DO   1 year ago Annual physical exam   [facility] Olin Hauser, DO      Future Appointments            In 10 months  [place], Devonne Doughty, [facility], [facility]

## 2020-07-27 DIAGNOSIS — S82115D Nondisplaced fracture of left tibial spine, subsequent encounter for closed fracture with routine healing: Secondary | ICD-10-CM | POA: Diagnosis not present

## 2020-08-03 DIAGNOSIS — S82115D Nondisplaced fracture of left tibial spine, subsequent encounter for closed fracture with routine healing: Secondary | ICD-10-CM | POA: Diagnosis not present

## 2020-08-05 DIAGNOSIS — S82115D Nondisplaced fracture of left tibial spine, subsequent encounter for closed fracture with routine healing: Secondary | ICD-10-CM | POA: Diagnosis not present

## 2020-08-10 DIAGNOSIS — S82115D Nondisplaced fracture of left tibial spine, subsequent encounter for closed fracture with routine healing: Secondary | ICD-10-CM | POA: Diagnosis not present

## 2020-08-17 DIAGNOSIS — S82115D Nondisplaced fracture of left tibial spine, subsequent encounter for closed fracture with routine healing: Secondary | ICD-10-CM | POA: Diagnosis not present

## 2020-08-19 DIAGNOSIS — S82115D Nondisplaced fracture of left tibial spine, subsequent encounter for closed fracture with routine healing: Secondary | ICD-10-CM | POA: Diagnosis not present

## 2020-08-24 DIAGNOSIS — S82115D Nondisplaced fracture of left tibial spine, subsequent encounter for closed fracture with routine healing: Secondary | ICD-10-CM | POA: Diagnosis not present

## 2020-09-23 ENCOUNTER — Other Ambulatory Visit: Payer: Self-pay | Admitting: General Surgery

## 2020-09-23 DIAGNOSIS — Z1231 Encounter for screening mammogram for malignant neoplasm of breast: Secondary | ICD-10-CM

## 2020-09-23 DIAGNOSIS — H524 Presbyopia: Secondary | ICD-10-CM | POA: Diagnosis not present

## 2020-09-23 DIAGNOSIS — H52223 Regular astigmatism, bilateral: Secondary | ICD-10-CM | POA: Diagnosis not present

## 2020-09-23 DIAGNOSIS — H43813 Vitreous degeneration, bilateral: Secondary | ICD-10-CM | POA: Diagnosis not present

## 2020-09-23 DIAGNOSIS — H2513 Age-related nuclear cataract, bilateral: Secondary | ICD-10-CM | POA: Diagnosis not present

## 2020-09-23 DIAGNOSIS — H5203 Hypermetropia, bilateral: Secondary | ICD-10-CM | POA: Diagnosis not present

## 2020-09-23 DIAGNOSIS — H33311 Horseshoe tear of retina without detachment, right eye: Secondary | ICD-10-CM | POA: Diagnosis not present

## 2020-09-24 DIAGNOSIS — H43813 Vitreous degeneration, bilateral: Secondary | ICD-10-CM | POA: Diagnosis not present

## 2020-09-24 DIAGNOSIS — H33311 Horseshoe tear of retina without detachment, right eye: Secondary | ICD-10-CM | POA: Diagnosis not present

## 2020-09-24 DIAGNOSIS — H2513 Age-related nuclear cataract, bilateral: Secondary | ICD-10-CM | POA: Diagnosis not present

## 2020-09-29 DIAGNOSIS — H33311 Horseshoe tear of retina without detachment, right eye: Secondary | ICD-10-CM | POA: Diagnosis not present

## 2020-10-07 DIAGNOSIS — M810 Age-related osteoporosis without current pathological fracture: Secondary | ICD-10-CM | POA: Diagnosis not present

## 2020-10-15 DIAGNOSIS — M810 Age-related osteoporosis without current pathological fracture: Secondary | ICD-10-CM | POA: Diagnosis not present

## 2020-10-26 ENCOUNTER — Other Ambulatory Visit: Payer: Self-pay

## 2020-10-27 DIAGNOSIS — H33311 Horseshoe tear of retina without detachment, right eye: Secondary | ICD-10-CM | POA: Diagnosis not present

## 2020-10-27 DIAGNOSIS — H43813 Vitreous degeneration, bilateral: Secondary | ICD-10-CM | POA: Diagnosis not present

## 2020-10-28 ENCOUNTER — Ambulatory Visit
Admission: RE | Admit: 2020-10-28 | Discharge: 2020-10-28 | Disposition: A | Discharge status: Home / Self Care | Payer: Medicare Other | Source: Ambulatory Visit | Attending: General Surgery | Admitting: General Surgery

## 2020-10-28 ENCOUNTER — Other Ambulatory Visit: Payer: Self-pay

## 2020-10-28 DIAGNOSIS — Z1231 Encounter for screening mammogram for malignant neoplasm of breast: Secondary | ICD-10-CM | POA: Insufficient documentation

## 2020-11-04 DIAGNOSIS — Z853 Personal history of malignant neoplasm of breast: Secondary | ICD-10-CM | POA: Diagnosis not present

## 2020-11-28 ENCOUNTER — Other Ambulatory Visit: Payer: Self-pay | Admitting: Family Medicine

## 2020-11-28 DIAGNOSIS — I10 Essential (primary) hypertension: Secondary | ICD-10-CM

## 2020-11-28 DIAGNOSIS — E785 Hyperlipidemia, unspecified: Secondary | ICD-10-CM

## 2020-11-28 NOTE — Telephone Encounter (Signed)
Requested Prescriptions  Pending Prescriptions Disp Refills  . metoprolol tartrate (LOPRESSOR) 25 MG tablet [Pharmacy Med Name: Metoprolol Tartrate 25 MG Oral Tablet] 180 tablet 1    Sig: TAKE 1 TABLET BY MOUTH  TWICE DAILY     Cardiovascular:  Beta Blockers Failed - 11/28/2020  5:00 AM      Failed - Valid encounter within last 6 months    Recent Outpatient Visits          5 months ago Other closed fracture of proximal end of left tibia, initial encounter   [place], FNP   6 months ago Annual physical exam   [facility] Olin Hauser, DO   10 months ago Acute bronchitis, unspecified organism   [place], Devonne Doughty, DO   1 year ago Pre-diabetes   [place], DO   1 year ago Annual physical exam   [place], DO      Future Appointments            In 5 months [place], Devonne Doughty, [facility], [place] BP in normal range    BP Readings from Last 1 Encounters:  06/07/20 (!) 115/55         Passed - Last Heart Rate in normal range    Pulse Readings from Last 1 Encounters:  06/07/20 79         . pravastatin (PRAVACHOL) 20 MG tablet [Pharmacy Med Name: Pravastatin Sodium 20 MG Oral Tablet] 90 tablet 1    Sig: TAKE 1 TABLET BY MOUTH  DAILY     Cardiovascular:  Antilipid - Statins Passed - 11/28/2020  5:00 AM      Passed - Total Cholesterol in normal range and within 360 days    Cholesterol, Total  Date Value Ref Range Status  02/08/2016 165 100 - 199 mg/dL Final   Cholesterol  Date Value Ref Range Status  05/10/2020 173 <200 mg/dL Final         Passed - LDL in normal range and within 360 days    LDL Cholesterol (Calc)  Date Value Ref Range Status  05/10/2020 84 mg/dL (calc) Final    Comment:    Reference range: <100 . Desirable range <100 mg/dL for  primary prevention;   <70 mg/dL for patients with CHD or diabetic patients  with > or = 2 CHD risk factors. Marland Kitchen LDL-C is now calculated using the Martin-Hopkins  calculation, which is a validated novel method providing  better accuracy than the Friedewald equation in the  estimation of LDL-C.  Cresenciano Genre et al. Annamaria Helling. 4888;916(94): 2061-2068  (http://education.[employer].com/faq/FAQ164)          Passed - HDL in normal range and within 360 days    HDL  Date Value Ref Range Status  05/10/2020 75 > OR = 50 mg/dL Final  02/08/2016 79 >39 mg/dL Final         Passed - Triglycerides in normal range and within 360 days    Triglycerides  Date Value Ref Range Status  05/10/2020 62 <150 mg/dL Final         Passed - Patient is not pregnant      Passed - Valid encounter within last 12 months    Recent Outpatient Visits  5 months ago Other closed fracture of proximal end of left tibia, initial encounter   [place], FNP   6 months ago Annual physical exam   [facility] Olin Hauser, DO   10 months ago Acute bronchitis, unspecified organism   [place], Devonne Doughty, DO   1 year ago Pre-diabetes   [facility] [place], Devonne Doughty, DO   1 year ago Annual physical exam   [facility] Olin Hauser, DO      Future Appointments            In 5 months [place], Devonne Doughty, [facility], [facility]

## 2020-11-30 DIAGNOSIS — M810 Age-related osteoporosis without current pathological fracture: Secondary | ICD-10-CM | POA: Diagnosis not present

## 2020-11-30 DIAGNOSIS — Z9181 History of falling: Secondary | ICD-10-CM | POA: Diagnosis not present

## 2020-11-30 DIAGNOSIS — Z8781 Personal history of (healed) traumatic fracture: Secondary | ICD-10-CM | POA: Diagnosis not present

## 2020-12-20 ENCOUNTER — Other Ambulatory Visit: Payer: Self-pay | Admitting: Family Medicine

## 2020-12-20 DIAGNOSIS — I10 Essential (primary) hypertension: Secondary | ICD-10-CM

## 2020-12-24 DIAGNOSIS — M810 Age-related osteoporosis without current pathological fracture: Secondary | ICD-10-CM | POA: Diagnosis not present

## 2021-01-21 ENCOUNTER — Telehealth: Payer: Self-pay | Admitting: Family Medicine

## 2021-01-21 NOTE — Telephone Encounter (Signed)
Copied from [place] [id number]. Topic: Medicare AWV >> Jan 21, 2021  1:34 PM Cher Nakai R wrote: Reason for CRM:  Left message for patient to call back and schedule the Medicare Annual Wellness Visit (AWV) virtually or by telephone.  Last AWV 06/17/2019  Please schedule at anytime with [facility].  40 minute appointment  Any questions, please call me at [phone]

## 2021-01-24 ENCOUNTER — Other Ambulatory Visit: Payer: Self-pay | Admitting: Family Medicine

## 2021-01-27 ENCOUNTER — Other Ambulatory Visit: Payer: Self-pay

## 2021-01-27 ENCOUNTER — Telehealth (INDEPENDENT_AMBULATORY_CARE_PROVIDER_SITE_OTHER): Payer: Medicare Other | Admitting: Family Medicine

## 2021-01-27 ENCOUNTER — Encounter: Payer: Self-pay | Admitting: Family Medicine

## 2021-01-27 VITALS — Ht 63.0 in | Wt 137.0 lb

## 2021-01-27 DIAGNOSIS — R300 Dysuria: Secondary | ICD-10-CM | POA: Diagnosis not present

## 2021-01-27 DIAGNOSIS — N3001 Acute cystitis with hematuria: Secondary | ICD-10-CM | POA: Diagnosis not present

## 2021-01-27 LAB — POCT URINALYSIS DIPSTICK
Bilirubin, UA: NEGATIVE
Glucose, UA: NEGATIVE
Ketones, UA: NEGATIVE
Nitrite, UA: POSITIVE
Protein, UA: POSITIVE — AB
Spec Grav, UA: 1.02 (ref 1.010–1.025)
Urobilinogen, UA: 0.2 E.U./dL
pH, UA: 6 (ref 5.0–8.0)

## 2021-01-27 MED ORDER — CEPHALEXIN 500 MG PO CAPS: 500.0000 mg | ORAL_CAPSULE | Freq: Three times a day (TID) | ORAL | 0 refills | Status: DC

## 2021-01-27 NOTE — Patient Instructions (Addendum)
  1. You have a Urinary Tract Infection - this is very common, your symptoms are reassuring and you should get better within 1 week on the antibiotics - Start Keflex '500mg'$  3 times daily for next 7 days, complete entire course, even if feeling better - We sent urine for a culture, we will call you within next few days if we need to change antibiotics - Please drink plenty of fluids, improve hydration over next 1 week  If symptoms worsening, developing nausea / vomiting, worsening back pain, fevers / chills / sweats, then please return for re-evaluation sooner.  If you take AZO OTC - limit this to 2-3 days MAX to avoid affecting kidneys  D-Mannose is a natural supplement that can actually help bind to urinary bacteria and reduce their effectiveness it can help prevent UTI from forming, and may reduce some symptoms. It likely cannot cure an active UTI but it is worth a try and good to prevent them with. Try '500mg'$  twice a day at a full dose if you want, or check package instructions for more info    Please schedule a Follow-up Appointment to: Return if symptoms worsen or fail to improve.  If you have any other questions or concerns, please feel free to call the office or send a message through [place]. You may also schedule an earlier appointment if necessary.  Additionally, you may be receiving a survey about your experience at our office within a few days to 1 week by e-mail or mail. We value your feedback.  Nobie Putnam, DO [place]

## 2021-01-27 NOTE — Progress Notes (Signed)
Virtual Visit via Telephone The purpose of this virtual visit is to provide medical care while limiting exposure to the novel coronavirus (COVID19) for both patient and office staff.  Consent was obtained for phone visit:  Yes.   Answered questions that patient had about telehealth interaction:  Yes.   I discussed the limitations, risks, security and privacy concerns of performing an evaluation and management service by telephone. I also discussed with the patient that there may be a patient responsible charge related to this service. The patient expressed understanding and agreed to proceed.  Patient Location: Home Provider Location: [facility] (Office)  Participants in virtual visit: - Patient: Shirley Decamp. [place] - CMA: Orinda Kenner, CMA - Provider: Dr [place]  ---------------------------------------------------------------------- Chief Complaint  Patient presents with  . Urinary Frequency    S: Reviewed CMA documentation. I have called patient and gathered additional HPI as follows:  UTI / Urinary Urgency / Dysuria Reports that symptoms started several days ago, with UTI symptoms bladder pressure and discomfort, burning dysuria. Not improving. Drinking water. Not taking OTC medications.  Denies any high risk travel to areas of current concern for COVID19. Denies any known or suspected exposure to person with or possibly with COVID19.  Denies any fevers, chills, sweats, body ache, cough, shortness of breath, sinus pain or pressure, headache, abdominal pain, diarrhea  Past Medical History:  Diagnosis Date  . Anemia   . Breast cancer ([place]) 09/2014   left breast lumpectomy with rad tx  . Hyperlipidemia   . Malignant neoplasm of lower-inner quadrant of left female breast ([place]) 07/2014   5 mm,T1a, N0; ER/ PR positive, HER-2/neu negative  . Osteoporosis   . Personal history of radiation therapy 2016   left breast ca   Social History   Tobacco Use  .  Smoking status: Never Smoker  . Smokeless tobacco: Never Used  Vaping Use  . Vaping Use: Never used  Substance Use Topics  . Alcohol use: No    Alcohol/week: 0.0 standard drinks  . Drug use: No    Current Outpatient Medications:  .  albuterol (VENTOLIN HFA) 108 (90 Base) MCG/ACT inhaler, , Disp: , Rfl:  .  amLODipine (NORVASC) 5 MG tablet, TAKE 1 TABLET BY MOUTH  DAILY, Disp: 90 tablet, Rfl: 0 .  Ascorbic Acid (VITAMIN C) 1000 MG tablet, Take 1,000 mg by mouth daily., Disp: , Rfl:  .  aspirin EC 81 MG tablet, Take 81 mg by mouth daily., Disp: , Rfl:  .  calcium carbonate 100 mg/ml SUSP, Take by mouth., Disp: , Rfl:  .  cephALEXin (KEFLEX) 500 MG capsule, Take 1 capsule (500 mg total) by mouth 3 (three) times daily. For 7 days, Disp: 21 capsule, Rfl: 0 .  Cholecalciferol (VITAMIN D3) 10 MCG (400 UNIT) CAPS, Take by mouth., Disp: , Rfl:  .  co-enzyme Q-10 30 MG capsule, Take 30 mg by mouth 3 (three) times daily., Disp: , Rfl:  .  denosumab (PROLIA) 60 MG/ML SOSY injection, INJECT 1 ML( 60 MG) UNDER THE SKIN EVERY 6 MONTHS, Disp: , Rfl:  .  FERROUS SULFATE PO, Take 1 tablet by mouth once a week., Disp: , Rfl:  .  fluticasone (FLONASE) 50 MCG/ACT nasal spray, USE 2 SPRAYS IN EACH NOSTRIL ONCE DAILY FOR UP TO 4 TO 6 WEEKS, THEN AS NEEDED FOR ALLERGIES, Disp: 16 g, Rfl: 5 .  loratadine (CLARITIN) 10 MG tablet, Take 1 tablet (10 mg total) by mouth daily. For up to  1 month, then as needed for seasonal allergies., Disp: 30 tablet, Rfl: 3 .  losartan-hydrochlorothiazide (HYZAAR) 100-25 MG tablet, TAKE 1 TABLET BY MOUTH  DAILY, Disp: 90 tablet, Rfl: 1 .  Lutein-Zeaxanthin 15-0.7 MG CAPS, Take 1 capsule by mouth daily., Disp: , Rfl:  .  metoprolol tartrate (LOPRESSOR) 25 MG tablet, TAKE 1 TABLET BY MOUTH  TWICE DAILY, Disp: 180 tablet, Rfl: 1 .  Multiple Vitamin (MULTIVITAMIN) tablet, Take 1 tablet by mouth daily., Disp: , Rfl:  .  Omega-3 Fatty Acids (FISH OIL PO), Take 1 capsule by mouth daily.,  Disp: , Rfl:  .  pravastatin (PRAVACHOL) 20 MG tablet, TAKE 1 TABLET BY MOUTH  DAILY, Disp: 90 tablet, Rfl: 1 .  Saccharomyces boulardii (PROBIOTIC) 250 MG CAPS, Take 250 mg by mouth daily., Disp: , Rfl:   Depression screen [facility] 2/9 01/27/2021 05/17/2020 01/15/2020  Decreased Interest 0 0 0  Down, Depressed, Hopeless 0 0 0  PHQ - 2 Score 0 0 0  Altered sleeping 0 - -  Tired, decreased energy 0 - -  Change in appetite 0 - -  Feeling bad or failure about yourself  0 - -  Trouble concentrating 0 - -  Moving slowly or fidgety/restless 0 - -  Suicidal thoughts 0 - -  PHQ-9 Score 0 - -  Difficult doing work/chores Not difficult at all - -    GAD 7 : Generalized Anxiety Score 01/27/2021  Nervous, Anxious, on Edge 0  Control/stop worrying 0  Worry too much - different things 0  Trouble relaxing 0  Restless 0  Easily annoyed or irritable 0  Afraid - awful might happen 0  Total GAD 7 Score 0  Anxiety Difficulty Not difficult at all    -------------------------------------------------------------------------- O: No physical exam performed due to remote telephone encounter.  Lab results reviewed.  Recent Results (from the past 2160 hour(s))  POCT Urinalysis Dipstick     Status: Abnormal   Collection Time: 01/27/21 10:37 AM  Result Value Ref Range   Color, UA Orange    Clarity, UA Cloudy    Glucose, UA Negative Negative   Bilirubin, UA Negative    Ketones, UA Negative    Spec Grav, UA 1.020 1.010 - 1.025   Blood, UA Moderate ++    pH, UA 6.0 5.0 - 8.0   Protein, UA Positive (A) Negative   Urobilinogen, UA 0.2 0.2 or 1.0 E.U./dL   Nitrite, UA Positive    Leukocytes, UA Large (3+) (A) Negative   Appearance     Odor      -------------------------------------------------------------------------- A&P:  Problem List Items Addressed This Visit   None   Visit Diagnoses    Acute cystitis with hematuria    -  Primary   Relevant Medications   cephALEXin (KEFLEX) 500 MG capsule    Other Relevant Orders   Urine Culture   Dysuria       Relevant Orders   POCT Urinalysis Dipstick (Completed)     Clinically consistent with UTI and confirmed on UA. No recent UTIs or abx courses.  No concern for pyelo today (no systemic symptoms, neg fever, back pain, n/v).  Plan: 1. UA / micro - pos nitrite, pos leuks, RBC pos 2. Ordered Urine culture 3. Keflex 564m TID x 7 days 4. Improve PO hydration 5. RTC if no improvement 1-2 weeks, red flags given to return sooner    Meds ordered this encounter  Medications  . cephALEXin (KEFLEX) 500 MG capsule  Sig: Take 1 capsule (500 mg total) by mouth 3 (three) times daily. For 7 days    Dispense:  21 capsule    Refill:  0    Follow-up: - Return PRN  Patient verbalizes understanding with the above medical recommendations including the limitation of remote medical advice.  Specific follow-up and call-back criteria were given for patient to follow-up or seek medical care more urgently if needed.   - Time spent in direct consultation with patient on phone: 7 minutes  Nobie Putnam, [facility] 01/27/2021, 11:38 AM

## 2021-01-29 LAB — URINE CULTURE
MICRO NUMBER:: 11882667
SPECIMEN QUALITY:: ADEQUATE

## 2021-02-03 ENCOUNTER — Telehealth: Payer: Self-pay

## 2021-02-03 NOTE — Telephone Encounter (Signed)
There is a discrepancy with the rx. The requested one Valsartan/HCTZ 160/25 is not what we have listed in chart. I have not rx that one. Unless it was changed due to a back order or out of stock issue.  She was previously on Losartan-HCTZ 100-25mg  daily.  Can you call her to clarify which one she needs?  Let me know. Thanks  Nobie Putnam, [facility] 02/03/2021, 7:15 PM

## 2021-02-03 NOTE — Telephone Encounter (Signed)
Pt is  Out of medication  And requesting a refill on Valsartan/HCTZ tab 160-25 [facility]  In [place]

## 2021-02-04 ENCOUNTER — Other Ambulatory Visit: Payer: Self-pay

## 2021-02-04 DIAGNOSIS — I10 Essential (primary) hypertension: Secondary | ICD-10-CM

## 2021-02-04 MED ORDER — LOSARTAN POTASSIUM-HCTZ 100-25 MG PO TABS: 1.0000 | ORAL_TABLET | Freq: Every day | ORAL | 1 refills | Status: DC

## 2021-02-04 NOTE — Telephone Encounter (Signed)
I have spoken with the patient earlier today and got clarification about the medication.   She said that the last time she tried to fill the losartan, the pharmacy was out so they substituted it..   When she set for a refill, Dr. Raliegh Ip had denied it because he was not aware of the change.   She told me that she wants to remain on the Losartan, so a refill was went.   No further actions are needed at this time.

## 2021-02-04 NOTE — Telephone Encounter (Signed)
Attempted to reach the office - regarding the below medication. Pt states that it was denied and she would like to know why?   Pt states that she was on Losartan and the mail order sent Valsartan. Pt thought the script has been changed to Valsartan. Pt states she just needs to know what she is supposed to take.   Pt states that she will be going out of town today and she has missed two days already.  Requesting 2 month supply [name]   [facility]- [phone]

## 2021-03-07 DIAGNOSIS — C44311 Basal cell carcinoma of skin of nose: Secondary | ICD-10-CM | POA: Diagnosis not present

## 2021-03-07 DIAGNOSIS — L578 Other skin changes due to chronic exposure to nonionizing radiation: Secondary | ICD-10-CM | POA: Diagnosis not present

## 2021-03-07 DIAGNOSIS — D485 Neoplasm of uncertain behavior of skin: Secondary | ICD-10-CM | POA: Diagnosis not present

## 2021-03-07 DIAGNOSIS — Z872 Personal history of diseases of the skin and subcutaneous tissue: Secondary | ICD-10-CM | POA: Diagnosis not present

## 2021-03-07 DIAGNOSIS — Z859 Personal history of malignant neoplasm, unspecified: Secondary | ICD-10-CM | POA: Diagnosis not present

## 2021-03-27 ENCOUNTER — Other Ambulatory Visit: Payer: Self-pay | Admitting: Family Medicine

## 2021-03-27 DIAGNOSIS — I10 Essential (primary) hypertension: Secondary | ICD-10-CM

## 2021-03-27 NOTE — Telephone Encounter (Signed)
Requested Prescriptions  Pending Prescriptions Disp Refills  . amLODipine (NORVASC) 5 MG tablet [Pharmacy Med Name: amLODIPine Besylate 5 MG Oral Tablet] 90 tablet 0    Sig: TAKE 1 TABLET BY MOUTH  DAILY     Cardiovascular:  Calcium Channel Blockers Passed - 03/27/2021  8:11 AM      Passed - Last BP in normal range    BP Readings from Last 1 Encounters:  06/07/20 (!) 115/55         Passed - Valid encounter within last 6 months    Recent Outpatient Visits          1 month ago Acute cystitis with hematuria   [place], DO   9 months ago Other closed fracture of proximal end of left tibia, initial encounter   [place], FNP   10 months ago Annual physical exam   [facility] Olin Hauser, DO   1 year ago Acute bronchitis, unspecified organism   [place], Devonne Doughty, DO   1 year ago [facility] [place], Devonne Doughty, DO      Future Appointments            In 1 month [place], Devonne Doughty, [facility], [facility]

## 2021-03-31 ENCOUNTER — Other Ambulatory Visit: Payer: Self-pay

## 2021-03-31 DIAGNOSIS — I10 Essential (primary) hypertension: Secondary | ICD-10-CM

## 2021-03-31 MED ORDER — LOSARTAN POTASSIUM-HCTZ 100-25 MG PO TABS: 1.0000 | ORAL_TABLET | Freq: Every day | ORAL | 3 refills | Status: DC

## 2021-04-04 DIAGNOSIS — C4491 Basal cell carcinoma of skin, unspecified: Secondary | ICD-10-CM | POA: Diagnosis not present

## 2021-04-04 DIAGNOSIS — C44311 Basal cell carcinoma of skin of nose: Secondary | ICD-10-CM | POA: Diagnosis not present

## 2021-05-16 ENCOUNTER — Other Ambulatory Visit: Payer: Self-pay

## 2021-05-16 DIAGNOSIS — Z1159 Encounter for screening for other viral diseases: Secondary | ICD-10-CM

## 2021-05-16 DIAGNOSIS — R7303 Prediabetes: Secondary | ICD-10-CM

## 2021-05-16 DIAGNOSIS — E782 Mixed hyperlipidemia: Secondary | ICD-10-CM

## 2021-05-16 DIAGNOSIS — Z0000 Encounter for general adult medical examination without abnormal findings: Secondary | ICD-10-CM

## 2021-05-16 DIAGNOSIS — I10 Essential (primary) hypertension: Secondary | ICD-10-CM

## 2021-05-16 DIAGNOSIS — D509 Iron deficiency anemia, unspecified: Secondary | ICD-10-CM

## 2021-05-17 ENCOUNTER — Other Ambulatory Visit: Payer: Self-pay

## 2021-05-17 ENCOUNTER — Other Ambulatory Visit: Payer: Medicare Other

## 2021-05-17 DIAGNOSIS — E782 Mixed hyperlipidemia: Secondary | ICD-10-CM | POA: Diagnosis not present

## 2021-05-17 DIAGNOSIS — I10 Essential (primary) hypertension: Secondary | ICD-10-CM | POA: Diagnosis not present

## 2021-05-17 DIAGNOSIS — R7303 Prediabetes: Secondary | ICD-10-CM | POA: Diagnosis not present

## 2021-05-17 DIAGNOSIS — Z1159 Encounter for screening for other viral diseases: Secondary | ICD-10-CM | POA: Diagnosis not present

## 2021-05-17 DIAGNOSIS — D509 Iron deficiency anemia, unspecified: Secondary | ICD-10-CM | POA: Diagnosis not present

## 2021-05-18 LAB — LIPID PANEL
Cholesterol: 157 mg/dL (ref <200)
HDL: 68 mg/dL (ref >=50)
LDL Cholesterol (Calc): 73 mg/dL (calc)
Non-HDL Cholesterol (Calc): 89 mg/dL (calc) (ref <130)
Total CHOL/HDL Ratio: 2.3 (calc) (ref <5.0)
Triglycerides: 82 mg/dL (ref <150)

## 2021-05-18 LAB — HEPATITIS C ANTIBODY
Hepatitis C Ab: NONREACTIVE
SIGNAL TO CUT-OFF: 0 (ref <1.00)

## 2021-05-18 LAB — CBC WITH DIFFERENTIAL/PLATELET
Absolute Monocytes: 270 cells/uL (ref 200–950)
Basophils Absolute: 30 cells/uL (ref 0–200)
Basophils Relative: 1.1 %
Eosinophils Absolute: 119 cells/uL (ref 15–500)
Eosinophils Relative: 4.4 %
HCT: 34.6 % — ABNORMAL LOW (ref 35.0–45.0)
Hemoglobin: 11.2 g/dL — ABNORMAL LOW (ref 11.7–15.5)
Lymphs Abs: 969 cells/uL (ref 850–3900)
MCH: 36 pg — ABNORMAL HIGH (ref 27.0–33.0)
MCHC: 32.4 g/dL (ref 32.0–36.0)
MCV: 111.3 fL — ABNORMAL HIGH (ref 80.0–100.0)
MPV: 11.1 fL (ref 7.5–12.5)
Monocytes Relative: 10 %
Neutro Abs: 1312 cells/uL — ABNORMAL LOW (ref 1500–7800)
Neutrophils Relative %: 48.6 %
Platelets: 195 10*3/uL (ref 140–400)
RBC: 3.11 10*6/uL — ABNORMAL LOW (ref 3.80–5.10)
RDW: 12.5 % (ref 11.0–15.0)
Total Lymphocyte: 35.9 %
WBC: 2.7 10*3/uL — ABNORMAL LOW (ref 3.8–10.8)

## 2021-05-18 LAB — COMPLETE METABOLIC PANEL WITH GFR
AG Ratio: 2 (calc) (ref 1.0–2.5)
ALT: 11 U/L (ref 6–29)
AST: 13 U/L (ref 10–35)
Albumin: 4.2 g/dL (ref 3.6–5.1)
Alkaline phosphatase (APISO): 29 U/L — ABNORMAL LOW (ref 37–153)
BUN: 21 mg/dL (ref 7–25)
CO2: 28 mmol/L (ref 20–32)
Calcium: 9.9 mg/dL (ref 8.6–10.4)
Chloride: 104 mmol/L (ref 98–110)
Creat: 0.72 mg/dL (ref 0.60–0.95)
Globulin: 2.1 g/dL (calc) (ref 1.9–3.7)
Glucose, Bld: 97 mg/dL (ref 65–99)
Potassium: 3.8 mmol/L (ref 3.5–5.3)
Sodium: 140 mmol/L (ref 135–146)
Total Bilirubin: 0.7 mg/dL (ref 0.2–1.2)
Total Protein: 6.3 g/dL (ref 6.1–8.1)
eGFR: 84 mL/min/{1.73_m2} (ref >=60)

## 2021-05-18 LAB — HEMOGLOBIN A1C
Hgb A1c MFr Bld: 5.5 % of total Hgb (ref <5.7)
Mean Plasma Glucose: 111 mg/dL
eAG (mmol/L): 6.2 mmol/L

## 2021-05-18 LAB — TSH: TSH: 1.53 mIU/L (ref 0.40–4.50)

## 2021-05-24 ENCOUNTER — Ambulatory Visit (INDEPENDENT_AMBULATORY_CARE_PROVIDER_SITE_OTHER): Payer: Medicare Other | Admitting: Family Medicine

## 2021-05-24 ENCOUNTER — Other Ambulatory Visit: Payer: Self-pay

## 2021-05-24 ENCOUNTER — Encounter: Payer: Self-pay | Admitting: Family Medicine

## 2021-05-24 VITALS — BP 134/66 | HR 80 | Ht 63.0 in | Wt 140.2 lb

## 2021-05-24 DIAGNOSIS — D509 Iron deficiency anemia, unspecified: Secondary | ICD-10-CM

## 2021-05-24 DIAGNOSIS — Z0000 Encounter for general adult medical examination without abnormal findings: Secondary | ICD-10-CM

## 2021-05-24 DIAGNOSIS — I10 Essential (primary) hypertension: Secondary | ICD-10-CM

## 2021-05-24 DIAGNOSIS — R350 Frequency of micturition: Secondary | ICD-10-CM | POA: Diagnosis not present

## 2021-05-24 DIAGNOSIS — R7303 Prediabetes: Secondary | ICD-10-CM | POA: Diagnosis not present

## 2021-05-24 DIAGNOSIS — N3001 Acute cystitis with hematuria: Secondary | ICD-10-CM

## 2021-05-24 DIAGNOSIS — E782 Mixed hyperlipidemia: Secondary | ICD-10-CM | POA: Diagnosis not present

## 2021-05-24 LAB — POCT URINALYSIS DIPSTICK
Bilirubin, UA: NEGATIVE
Glucose, UA: NEGATIVE
Ketones, UA: POSITIVE
Nitrite, UA: POSITIVE
Protein, UA: POSITIVE — AB
Spec Grav, UA: 1.02 (ref 1.010–1.025)
Urobilinogen, UA: 0.2 E.U./dL
pH, UA: 6 (ref 5.0–8.0)

## 2021-05-24 MED ORDER — CEPHALEXIN 500 MG PO CAPS: 500.0000 mg | ORAL_CAPSULE | Freq: Three times a day (TID) | ORAL | 0 refills | Status: DC

## 2021-05-24 NOTE — Patient Instructions (Addendum)
Thank you for coming to the office today.  Refills within next 1-3 months, let me know if need help.  Labs printed.  Start Keflex antibiotic, since 4 months since last time. Stay tuned if Urine culture shows something that requires a change.  Shingles Shot Shingrix at pharmacy if interested, 2 doses 2-6 months apart  Future COVID booster.  Flu Shot  Mild low anemia, so increase iron to 2-3 times a week if tolerated.  Please schedule a Follow-up Appointment to: Return in about 1 year (around 05/24/2022) for 1 year fasting lab only then 1 week later Annual Physical.  If you have any other questions or concerns, please feel free to call the office or send a message through [place]. You may also schedule an earlier appointment if necessary.  Additionally, you may be receiving a survey about your experience at our office within a few days to 1 week by e-mail or mail. We value your feedback.  Nobie Putnam, DO [place]

## 2021-05-24 NOTE — Progress Notes (Signed)
Subjective:    Patient ID: Laural Mitchell, female    DOB: 1941-05-06, 80 y.o.   MRN: [id number]  Rebecca Mitchell is a 80 y.o. female presenting on 05/24/2021 for Annual Exam and Urinary Frequency   HPI  Here for Annual Physical and lab Review.   Elevated A1c Improved A1c to 5.5 CBGs: Not checking, not indicated Meds: Never on meds Currently on ARB Lifestyle: - Diet (Improving her balanced diet less sweets and reduce baked goods, not adding as much sugar to sweet tea, more water now - she is still working on this) - Exercise (less regular exercise now) Denies hypoglycemia   CHRONIC HTN: Reports no new concerns. Checks BP at home normal range Current Meds - Amlodipine 63m daily, Losartan-HCTZ 100-225mdaily, Metoprolol 2558mID Reports good compliance, took meds today. Tolerating well, w/o complaints.   Chronic Scoliosis / Chronic Back Pain - Reports chronic problem with scoliosis since adolescent. Has been re-established with [place] [facility]ennis [facility] [facility] [place], muscle spasm or soreness.   Anemia On iron 3 x week, doing well, prior problem. Last Hgb mild reduced Hgb 12 to 11.2 She missed some doses of iron supplement.   History of Breast Cancer Mammogram surveillance per [facility] Previously on Letrozole   Severe Osteoporosis [facility] SolGabriel Carinaon prolia, followed with DEXA Monitoring   Dsypnea - likely secondary to Scoliosis [facility], she has plenty of oxygen but still gets short of breath at times, she says likely due to her scoliosis, and will follow with them. She can stop and rest for a minute and keep active. Followed by Dr CalClayborn Bignesscardiac work up, evaluation of dyspnea, thought to be related to scoliosis   Reduced Hearing, Bilateral Has hearing aid bilateral   Health Maintenance:    UTD COVID19 vaccine  UTI Onset yesterday with  some dysuria burning, some bladder pressure discomfort. Drinking more water. Similar to prior UTI from 01/2021 was last one, treated with Keflex and it resolved.  Dermatology Dr BenAubery Lappingmoved basal cell carcinoma off nose in June.  [facility] 05/24/2021 01/27/2021 05/17/2020  Decreased Interest 0 0 0  Down, Depressed, Hopeless 0 0 0  PHQ - 2 Score 0 0 0  Altered sleeping 0 0 -  Tired, decreased energy 1 0 -  Change in appetite 0 0 -  Feeling bad or failure about yourself  0 0 -  Trouble concentrating 0 0 -  Moving slowly or fidgety/restless 0 0 -  Suicidal thoughts 0 0 -  PHQ-9 Score 1 0 -  Difficult doing work/chores Not difficult at all Not difficult at all -    Past Medical History:  Diagnosis Date   Anemia    [place]   left breast lumpectomy with rad tx   Hyperlipidemia    Malignant neoplasm of lower-inner quadrant of left female breast ([place]   5 mm,T1a, N0; ER/ PR positive, HER-2/neu negative   Osteoporosis    Personal history of radiation therapy 2016   left breast ca   Past Surgical History:  Procedure Laterality Date   BREAST BIOPSY Left 08/2014   invasive mammary carcinoma   BREAST LUMPECTOMY Left 10/01/2014   invasive mammary carcinoma and DCIS with clear margins   BREAST SURGERY Left 10/01/2014   lumpectomy   COLONOSCOPY  2010   COLONOSCOPY WITH PROPOFOL  N/A 09/21/2015   Procedure: COLONOSCOPY WITH PROPOFOL;  Surgeon: Christene Lye, MD;  Location: [facility] ENDOSCOPY;  Service: Endoscopy;  Laterality: N/A;   UPPER GI ENDOSCOPY  2014   Social History   Socioeconomic History   Marital status: Married    Spouse name: Not on file   Number of children: Not on file   Years of education: Not on file   Highest education level: Bachelor's degree (e.g., BA, AB, BS)  Occupational History   Not on file  Tobacco Use   Smoking status: Never   Smokeless tobacco: Never  Vaping Use   Vaping Use: Never used   Substance and Sexual Activity   Alcohol use: No    Alcohol/week: 0.0 standard drinks   Drug use: No   Sexual activity: Not on file  Other Topics Concern   Not on file  Social History Narrative   Not on file   Social Determinants of Health   Financial Resource Strain: Not on file  Food Insecurity: Not on file  Transportation Needs: Not on file  Physical Activity: Not on file  Stress: Not on file  Social Connections: Not on file  Intimate Partner Violence: Not on file   Family History  Problem Relation Age of Onset   Breast cancer Sister 33   Heart disease Mother    Heart disease Father    Current Outpatient Medications on File Prior to Visit  Medication Sig   albuterol (VENTOLIN HFA) 108 (90 Base) MCG/ACT inhaler    amLODipine (NORVASC) 5 MG tablet TAKE 1 TABLET BY MOUTH  DAILY   Ascorbic Acid (VITAMIN C) 1000 MG tablet Take 1,000 mg by mouth daily.   aspirin EC 81 MG tablet Take 81 mg by mouth daily.   calcium carbonate 100 mg/ml SUSP Take by mouth.   Cholecalciferol (VITAMIN D3) 10 MCG (400 UNIT) CAPS Take by mouth.   co-enzyme Q-10 30 MG capsule Take 30 mg by mouth 3 (three) times daily.   denosumab (PROLIA) 60 MG/ML SOSY injection INJECT 1 ML( 60 MG) UNDER THE SKIN EVERY 6 MONTHS   FERROUS SULFATE PO Take 1 tablet by mouth once a week.   fluticasone (FLONASE) 50 MCG/ACT nasal spray USE 2 SPRAYS IN EACH NOSTRIL ONCE DAILY FOR UP TO 4 TO 6 WEEKS, THEN AS NEEDED FOR ALLERGIES   loratadine (CLARITIN) 10 MG tablet Take 1 tablet (10 mg total) by mouth daily. For up to 1 month, then as needed for seasonal allergies.   losartan-hydrochlorothiazide (HYZAAR) 100-25 MG tablet Take 1 tablet by mouth daily.   Lutein-Zeaxanthin 15-0.7 MG CAPS Take 1 capsule by mouth daily.   metoprolol tartrate (LOPRESSOR) 25 MG tablet TAKE 1 TABLET BY MOUTH  TWICE DAILY   Multiple Vitamin (MULTIVITAMIN) tablet Take 1 tablet by mouth daily.   Omega-3 Fatty Acids (FISH OIL PO) Take 1 capsule by mouth  daily.   pravastatin (PRAVACHOL) 20 MG tablet TAKE 1 TABLET BY MOUTH  DAILY   Saccharomyces boulardii (PROBIOTIC) 250 MG CAPS Take 250 mg by mouth daily.   No current facility-administered medications on file prior to visit.    Review of Systems  Constitutional:  Negative for activity change, appetite change, chills, diaphoresis, fatigue and fever.  HENT:  Negative for congestion and hearing loss.   Eyes:  Negative for visual disturbance.  Respiratory:  Negative for cough, chest tightness, shortness of breath and wheezing.   Cardiovascular:  Negative for chest pain, palpitations and leg swelling.  Gastrointestinal:  Negative  for abdominal pain, constipation, diarrhea, nausea and vomiting.  Genitourinary:  Positive for dysuria. Negative for frequency and hematuria.  Musculoskeletal:  Negative for arthralgias and neck pain.  Skin:  Negative for rash.  Neurological:  Negative for dizziness, weakness, light-headedness, numbness and headaches.  Hematological:  Negative for adenopathy.  Psychiatric/Behavioral:  Negative for behavioral problems, dysphoric mood and sleep disturbance.   Per HPI unless specifically indicated above      Objective:    BP 134/66   Pulse 80   Ht '5\' 3"'  (1.6 m)   Wt 140 lb 3.2 oz (63.6 kg)   SpO2 98%   BMI 24.84 kg/m   Wt Readings from Last 3 Encounters:  05/24/21 140 lb 3.2 oz (63.6 kg)  01/27/21 137 lb (62.1 kg)  05/17/20 139 lb (63 kg)    Physical Exam Vitals and nursing note reviewed.  Constitutional:      General: She is not in acute distress.    Appearance: She is well-developed. She is not diaphoretic.     Comments: Well-appearing, comfortable, cooperative  HENT:     Head: Normocephalic and atraumatic.  Eyes:     General:        Right eye: No discharge.        Left eye: No discharge.     Conjunctiva/sclera: Conjunctivae normal.     Pupils: Pupils are equal, round, and reactive to light.  Neck:     Thyroid: No thyromegaly.     Vascular: No  carotid bruit.  Cardiovascular:     Rate and Rhythm: Normal rate and regular rhythm.     Pulses: Normal pulses.     Heart sounds: Normal heart sounds. No murmur heard. Pulmonary:     Effort: Pulmonary effort is normal. No respiratory distress.     Breath sounds: Normal breath sounds. No wheezing or rales.  Abdominal:     General: Bowel sounds are normal. There is no distension.     Palpations: Abdomen is soft. There is no mass.     Tenderness: There is no abdominal tenderness.  Musculoskeletal:        General: No tenderness. Normal range of motion.     Cervical back: Normal range of motion and neck supple.     Right lower leg: No edema.     Left lower leg: No edema.     Comments: Upper / Lower Extremities: - Normal muscle tone, strength bilateral upper extremities 5/5, lower extremities 5/5  Lymphadenopathy:     Cervical: No cervical adenopathy.  Skin:    General: Skin is warm and dry.     Findings: No erythema or rash.  Neurological:     Mental Status: She is alert and oriented to person, place, and time.     Comments: Distal sensation intact to light touch all extremities  Psychiatric:        Mood and Affect: Mood normal.        Behavior: Behavior normal.        Thought Content: Thought content normal.     Comments: Well groomed, good eye contact, normal speech and thoughts      Results for orders placed or performed in visit on 05/24/21  POCT Urinalysis Dipstick  Result Value Ref Range   Color, UA Yellow    Clarity, UA Cloudy    Glucose, UA Negative Negative   Bilirubin, UA Negative    Ketones, UA Positive    Spec Grav, UA 1.020 1.010 - 1.025   Blood,  UA Large +++    pH, UA 6.0 5.0 - 8.0   Protein, UA Positive (A) Negative   Urobilinogen, UA 0.2 0.2 or 1.0 E.U./dL   Nitrite, UA Positive    Leukocytes, UA Large (3+) (A) Negative   Appearance     Odor        Assessment & Plan:   Problem List Items Addressed This Visit     Pre-diabetes   HLD  (hyperlipidemia)   Essential hypertension   Absolute anemia   Other Visit Diagnoses     Annual physical exam    -  Primary   Urinary frequency       Relevant Orders   POCT Urinalysis Dipstick (Completed)   Urine Culture   Acute cystitis with hematuria       Relevant Medications   cephALEXin (KEFLEX) 500 MG capsule       Updated Health Maintenance information Future Flu Shot, high dose when ready Future COVID booster Consider Shingrix, offered. Reviewed recent lab results with patient A1c controlled Continue current statin therapy HTN controlled on medications Encouraged improvement to lifestyle with diet and exercise Goal maintain healthy weight.  Continue with specialists as discussed above in HPI  UTI, acute Prior UTI 01/2021, resolved w/ Keflex., Klebsiella, reviewed culture. Acute episode onset 24 hours Start Keflex treatment today, pending Urine culture will adjust if needed.  Orders Placed This Encounter  Procedures   Urine Culture   POCT Urinalysis Dipstick      Meds ordered this encounter  Medications   cephALEXin (KEFLEX) 500 MG capsule    Sig: Take 1 capsule (500 mg total) by mouth 3 (three) times daily. For 7 days    Dispense:  21 capsule    Refill:  0      Follow up plan: Return in about 1 year (around 05/24/2022) for 1 year fasting lab only then 1 week later Annual Physical.  Nobie Putnam, DO [facility] 05/24/2021, 9:18 AM

## 2021-05-27 LAB — URINE CULTURE
MICRO NUMBER:: 12336287
SPECIMEN QUALITY:: ADEQUATE

## 2021-05-29 ENCOUNTER — Other Ambulatory Visit: Payer: Self-pay | Admitting: Family Medicine

## 2021-05-29 DIAGNOSIS — E785 Hyperlipidemia, unspecified: Secondary | ICD-10-CM

## 2021-05-29 DIAGNOSIS — I10 Essential (primary) hypertension: Secondary | ICD-10-CM

## 2021-05-29 NOTE — Telephone Encounter (Signed)
Requested Prescriptions  Pending Prescriptions Disp Refills  . metoprolol tartrate (LOPRESSOR) 25 MG tablet [Pharmacy Med Name: Metoprolol Tartrate 25 MG Oral Tablet] 180 tablet 1    Sig: TAKE 1 TABLET BY MOUTH  TWICE DAILY     Cardiovascular:  Beta Blockers Passed - 05/29/2021  6:28 AM      Passed - Last BP in normal range    BP Readings from Last 1 Encounters:  05/24/21 134/66         Passed - Last Heart Rate in normal range    Pulse Readings from Last 1 Encounters:  05/24/21 80         Passed - Valid encounter within last 6 months    Recent Outpatient Visits          5 days ago Annual physical exam   [place], DO   4 months ago Acute cystitis with hematuria   [place], DO   11 months ago Other closed fracture of proximal end of left tibia, initial encounter   [place], FNP   1 year ago Annual physical exam   [facility] Olin Hauser, DO   1 year ago Acute bronchitis, unspecified organism   [place], Devonne Doughty, DO      Future Appointments            In 1 year [place], Devonne Doughty, [facility], [place]           . pravastatin (PRAVACHOL) 20 MG tablet [Pharmacy Med Name: Pravastatin Sodium 20 MG Oral Tablet] 90 tablet 3    Sig: TAKE 1 TABLET BY MOUTH  DAILY     Cardiovascular:  Antilipid - Statins Passed - 05/29/2021  6:28 AM      Passed - Total Cholesterol in normal range and within 360 days    Cholesterol, Total  Date Value Ref Range Status  02/08/2016 165 100 - 199 mg/dL Final   Cholesterol  Date Value Ref Range Status  05/17/2021 157 <200 mg/dL Final         Passed - LDL in normal range and within 360 days    LDL Cholesterol (Calc)  Date Value Ref Range Status  05/17/2021 73 mg/dL (calc) Final    Comment:    Reference range: <100 . Desirable range <100  mg/dL for primary prevention;   <70 mg/dL for patients with CHD or diabetic patients  with > or = 2 CHD risk factors. Marland Kitchen LDL-C is now calculated using the Martin-Hopkins  calculation, which is a validated novel method providing  better accuracy than the Friedewald equation in the  estimation of LDL-C.  Cresenciano Genre et al. Annamaria Helling. [id number]): 2061-2068  (http://education.[employer].com/faq/FAQ164)          Passed - HDL in normal range and within 360 days    HDL  Date Value Ref Range Status  05/17/2021 68 > OR = 50 mg/dL Final  02/08/2016 79 >39 mg/dL Final         Passed - Triglycerides in normal range and within 360 days    Triglycerides  Date Value Ref Range Status  05/17/2021 82 <150 mg/dL Final         Passed - Patient is not pregnant      Passed - Valid encounter within last 12 months    Recent Outpatient Visits  5 days ago Annual physical exam   [place], DO   4 months ago Acute cystitis with hematuria   [place], DO   11 months ago Other closed fracture of proximal end of left tibia, initial encounter   [facility], Lupita Raider, FNP   1 year ago Annual physical exam   [facility] Olin Hauser, DO   1 year ago Acute bronchitis, unspecified organism   [place], Devonne Doughty, DO      Future Appointments            In 1 year [place] [facility], Devonne Doughty, [facility], [facility]

## 2021-06-13 ENCOUNTER — Other Ambulatory Visit (HOSPITAL_COMMUNITY): Payer: Self-pay | Admitting: Specialist

## 2021-06-13 ENCOUNTER — Other Ambulatory Visit: Payer: Self-pay | Admitting: Specialist

## 2021-06-13 DIAGNOSIS — R911 Solitary pulmonary nodule: Secondary | ICD-10-CM | POA: Diagnosis not present

## 2021-06-13 DIAGNOSIS — R0609 Other forms of dyspnea: Secondary | ICD-10-CM | POA: Diagnosis not present

## 2021-06-14 ENCOUNTER — Ambulatory Visit (INDEPENDENT_AMBULATORY_CARE_PROVIDER_SITE_OTHER): Payer: Medicare Other

## 2021-06-14 VITALS — Ht 63.0 in | Wt 140.0 lb

## 2021-06-14 DIAGNOSIS — Z0000 Encounter for general adult medical examination without abnormal findings: Secondary | ICD-10-CM | POA: Diagnosis not present

## 2021-06-14 NOTE — Progress Notes (Signed)
I connected with Rebecca Mitchell today by telephone and verified that I am speaking with the correct person using two identifiers. Location patient: home Location provider: work Persons participating in the virtual visit: Aislinn, Feliz LPN.   I discussed the limitations, risks, security and privacy concerns of performing an evaluation and management service by telephone and the availability of in person appointments. I also discussed with the patient that there may be a patient responsible charge related to this service. The patient expressed understanding and verbally consented to this telephonic visit.    Interactive audio and video telecommunications were attempted between this provider and patient, however failed, due to patient having technical difficulties OR patient did not have access to video capability.  We continued and completed visit with audio only.     Vital signs may be patient reported or missing.  Subjective:   Rebecca Mitchell is a 80 y.o. female who presents for Medicare Annual (Subsequent) preventive examination.  Review of Systems     Cardiac Risk Factors include: advanced age (>39mn, >>60women);dyslipidemia;hypertension     Objective:    Today's Vitals   06/14/21 1356  Weight: 140 lb (63.5 kg)  Height: '5\' 3"'  (1.6 m)   Body mass index is 24.8 kg/m.  Advanced Directives 06/14/2021 09/01/2019 06/17/2019 08/26/2018 05/14/2018 10/10/2017 08/23/2017  Does Patient Have a Medical Advance Directive? No No No No No No No  Copy of Healthcare Power of Attorney in Chart? - - - - - - -  Would patient like information on creating a medical advance directive? - No - Patient declined - No - Patient declined No - Patient declined No - Patient declined No - Patient declined    Current Medications (verified) Outpatient Encounter Medications as of 06/14/2021  Medication Sig   albuterol (VENTOLIN HFA) 108 (90 Base) MCG/ACT inhaler    amLODipine (NORVASC) 5 MG tablet TAKE 1  TABLET BY MOUTH  DAILY   Ascorbic Acid (VITAMIN C) 1000 MG tablet Take 1,000 mg by mouth daily.   aspirin EC 81 MG tablet Take 81 mg by mouth daily.   calcium carbonate 100 mg/ml SUSP Take by mouth.   Cholecalciferol (VITAMIN D3) 10 MCG (400 UNIT) CAPS Take by mouth.   co-enzyme Q-10 30 MG capsule Take 30 mg by mouth 3 (three) times daily.   FERROUS SULFATE PO Take 1 tablet by mouth once a week.   fluticasone (FLONASE) 50 MCG/ACT nasal spray USE 2 SPRAYS IN EACH NOSTRIL ONCE DAILY FOR UP TO 4 TO 6 WEEKS, THEN AS NEEDED FOR ALLERGIES   loratadine (CLARITIN) 10 MG tablet Take 1 tablet (10 mg total) by mouth daily. For up to 1 month, then as needed for seasonal allergies.   losartan-hydrochlorothiazide (HYZAAR) 100-25 MG tablet Take 1 tablet by mouth daily.   Lutein-Zeaxanthin 15-0.7 MG CAPS Take 1 capsule by mouth daily.   metoprolol tartrate (LOPRESSOR) 25 MG tablet TAKE 1 TABLET BY MOUTH  TWICE DAILY   Multiple Vitamin (MULTIVITAMIN) tablet Take 1 tablet by mouth daily.   Omega-3 Fatty Acids (FISH OIL PO) Take 1 capsule by mouth daily.   pravastatin (PRAVACHOL) 20 MG tablet TAKE 1 TABLET BY MOUTH  DAILY   Saccharomyces boulardii (PROBIOTIC) 250 MG CAPS Take 250 mg by mouth daily.   zoledronic acid (RECLAST) 5 MG/100ML SOLN injection Inject 5 mg into the vein once.   cephALEXin (KEFLEX) 500 MG capsule Take 1 capsule (500 mg total) by mouth 3 (three) times daily. For 7  days (Patient not taking: Reported on 06/14/2021)   denosumab (PROLIA) 60 MG/ML SOSY injection INJECT 1 ML( 60 MG) UNDER THE SKIN EVERY 6 MONTHS (Patient not taking: Reported on 06/14/2021)   No facility-administered encounter medications on file as of 06/14/2021.    Allergies (verified) Patient has no known allergies.   History: Past Medical History:  Diagnosis Date   Anemia    Breast cancer ([place]) 09/2014   left breast lumpectomy with rad tx   Hyperlipidemia    Malignant neoplasm of lower-inner quadrant of left female  breast ([place]) 07/2014   5 mm,T1a, N0; ER/ PR positive, HER-2/neu negative   Osteoporosis    Personal history of radiation therapy 2016   left breast ca   Past Surgical History:  Procedure Laterality Date   BREAST BIOPSY Left 08/2014   invasive mammary carcinoma   BREAST LUMPECTOMY Left 10/01/2014   invasive mammary carcinoma and DCIS with clear margins   BREAST SURGERY Left 10/01/2014   lumpectomy   COLONOSCOPY  2010   COLONOSCOPY WITH PROPOFOL N/A 09/21/2015   Procedure: COLONOSCOPY WITH PROPOFOL;  Surgeon: Christene Lye, MD;  Location: [facility] ENDOSCOPY;  Service: Endoscopy;  Laterality: N/A;   UPPER GI ENDOSCOPY  2014   Family History  Problem Relation Age of Onset   Breast cancer Sister 14   Heart disease Mother    Heart disease Father    Social History   Socioeconomic History   Marital status: Married    Spouse name: Not on file   Number of children: Not on file   Years of education: Not on file   Highest education level: Bachelor's degree (e.g., BA, AB, BS)  Occupational History   Not on file  Tobacco Use   Smoking status: Never   Smokeless tobacco: Never  Vaping Use   Vaping Use: Never used  Substance and Sexual Activity   Alcohol use: No    Alcohol/week: 0.0 standard drinks   Drug use: No   Sexual activity: Not on file  Other Topics Concern   Not on file  Social History Narrative   Not on file   Social Determinants of Health   Financial Resource Strain: Low Risk    Difficulty of Paying Living Expenses: Not hard at all  Food Insecurity: No Food Insecurity   Worried About Charity fundraiser in the Last Year: Never true   Ran Out of Food in the Last Year: Never true  Transportation Needs: No Transportation Needs   Lack of Transportation (Medical): No   Lack of Transportation (Non-Medical): No  Physical Activity: Sufficiently Active   Days of Exercise per Week: 2 days   Minutes of Exercise per Session: 90 min  Stress: No Stress Concern Present    Feeling of Stress : Not at all  Social Connections: Not on file    Tobacco Counseling Counseling given: Not Answered   Clinical Intake:  Pre-visit preparation completed: Yes  Pain : No/denies pain     Nutritional Status: BMI of 19-24  Normal Nutritional Risks: None Diabetes: No  How often do you need to have someone help you when you read instructions, pamphlets, or other written materials from your doctor or pharmacy?: 1 - Never What is the last grade level you completed in school?: college  Diabetic? no  Interpreter Needed?: No  Information entered by :: NAllen LPN   Activities of Daily Living In your present state of health, do you have any difficulty performing the following activities: 06/14/2021  Hearing? Y  Comment wears hearing aides  Vision? N  Difficulty concentrating or making decisions? N  Walking or climbing stairs? N  Dressing or bathing? N  Doing errands, shopping? N  Preparing Food and eating ? N  Using the Toilet? N  In the past six months, have you accidently leaked urine? N  Do you have problems with loss of bowel control? N  Managing your Medications? N  Managing your Finances? N  Housekeeping or managing your Housekeeping? N  Some recent data might be hidden    Patient Care Team: Olin Hauser, DO as PCP - General (Family Medicine) Noreene Filbert, MD as Referring Physician (Radiation Oncology) Bary Castilla Forest Gleason, MD (General Surgery) Jannet Mantis, MD (Dermatology) Gabriel Carina Betsey Holiday, MD as Physician Assistant (Endocrinology)  Indicate any recent Medical Services you may have received from other than [facility] providers in the past year (date may be approximate).     Assessment:   This is a routine wellness examination for Hlee.  Hearing/Vision screen Vision Screening - Comments:: Regular eye exams, Dr. Matilde Sprang  Dietary issues and exercise activities discussed: Current Exercise Habits: Structured exercise class, Type of  exercise: yoga;calisthenics, Time (Minutes): > 60, Frequency (Times/Week): 2, Weekly Exercise (Minutes/Week): 0   Goals Addressed             This Visit's Progress    Patient Stated       06/14/2021, stay healthy       Depression Screen PHQ 2/9 Scores 06/14/2021 05/24/2021 01/27/2021 05/17/2020 01/15/2020 06/17/2019 05/16/2019  PHQ - 2 Score 0 0 0 0 0 0 0  PHQ- 9 Score - 1 0 - - - -    Fall Risk Fall Risk  06/14/2021 05/24/2021 01/27/2021 05/17/2020 01/15/2020  Falls in the past year? 1 1 0 0 0  Comment missed the curb - - - -  Number falls in past yr: 0 1 0 0 0  Injury with Fall? 1 0 0 0 0  Risk for fall due to : Medication side effect - - - -  Follow up Falls evaluation completed;Education provided;Falls prevention discussed Falls evaluation completed Falls evaluation completed Falls evaluation completed Falls evaluation completed    FALL RISK PREVENTION PERTAINING TO THE HOME:  Any stairs in or around the home? Yes  If so, are there any without handrails? No  Home free of loose throw rugs in walkways, pet beds, electrical cords, etc? Yes  Adequate lighting in your home to reduce risk of falls? Yes   ASSISTIVE DEVICES UTILIZED TO PREVENT FALLS:  Life alert? No  Use of a cane, walker or w/c? No  Grab bars in the bathroom? No  Shower chair or bench in shower? Yes  Elevated toilet seat or a handicapped toilet? Yes   TIMED UP AND GO:  Was the test performed? No .      Cognitive Function:     6CIT Screen 06/14/2021 05/01/2017  What Year? 0 points 0 points  What month? 0 points 0 points  What time? 0 points 0 points  Count back from 20 0 points 0 points  Months in reverse 0 points 0 points  Repeat phrase 0 points 0 points  Total Score 0 0    Immunizations Immunization History  Administered Date(s) Administered   Fluad Quad(high Dose 65+) 05/16/2019, 07/06/2020   Influenza, High Dose Seasonal PF 08/15/2016, 06/13/2017, 06/20/2018   Influenza,inj,Quad PF,6+ Mos  06/14/2015   Influenza,inj,quad, With Preservative 06/19/2019   PFIZER(Purple Top)SARS-COV-2 Vaccination  10/03/2019, 10/24/2019   Pneumococcal Polysaccharide-23 08/12/2012    TDAP status: Up to date  Flu Vaccine status: Due, Education has been provided regarding the importance of this vaccine. Advised may receive this vaccine at local pharmacy or Health Dept. Aware to provide a copy of the vaccination record if obtained from local pharmacy or Health Dept. Verbalized acceptance and understanding.  Pneumococcal vaccine status: Up to date  Covid-19 vaccine status: Completed vaccines  Qualifies for Shingles Vaccine? Yes   Zostavax completed No   Shingrix Completed?: No.    Education has been provided regarding the importance of this vaccine. Patient has been advised to call insurance company to determine out of pocket expense if they have not yet received this vaccine. Advised may also receive vaccine at local pharmacy or Health Dept. Verbalized acceptance and understanding.  Screening Tests Health Maintenance  Topic Date Due   Zoster Vaccines- Shingrix (1 of 2) Never done   COVID-19 Vaccine (3 - Pfizer risk series) 11/21/2019   INFLUENZA VACCINE  04/18/2021   TETANUS/TDAP  03/25/2024   DEXA SCAN  Completed   HPV VACCINES  Aged Out    Health Maintenance  Health Maintenance Due  Topic Date Due   Zoster Vaccines- Shingrix (1 of 2) Never done   COVID-19 Vaccine (3 - Pfizer risk series) 11/21/2019   INFLUENZA VACCINE  04/18/2021    Colorectal cancer screening: No longer required.   Mammogram status: Completed 10/28/2020. Repeat every year  Bone Density status: due  Lung Cancer Screening: (Low Dose CT Chest recommended if Age 83-80 years, 30 pack-year currently smoking OR have quit w/in 15years.) does not qualify.   Lung Cancer Screening Referral: no  Additional Screening:  Hepatitis C Screening: does not qualify;   Vision Screening: Recommended annual ophthalmology exams  for early detection of glaucoma and other disorders of the eye. Is the patient up to date with their annual eye exam?  Yes  Who is the provider or what is the name of the office in which the patient attends annual eye exams? Dr. Matilde Sprang If pt is not established with a provider, would they like to be referred to a provider to establish care? No .   Dental Screening: Recommended annual dental exams for proper oral hygiene  Community Resource Referral / Chronic Care Management: CRR required this visit?  No   CCM required this visit?  No      Plan:     I have personally reviewed and noted the following in the patient's chart:   Medical and social history Use of alcohol, tobacco or illicit drugs  Current medications and supplements including opioid prescriptions.  Functional ability and status Nutritional status Physical activity Advanced directives List of other physicians Hospitalizations, surgeries, and ER visits in previous 12 months Vitals Screenings to include cognitive, depression, and falls Referrals and appointments  In addition, I have reviewed and discussed with patient certain preventive protocols, quality metrics, and best practice recommendations. A written personalized care plan for preventive services as well as general preventive health recommendations were provided to patient.     Kellie Simmering, LPN   [id number]   Nurse Notes:

## 2021-06-14 NOTE — Patient Instructions (Signed)
Rebecca Mitchell , Thank you for taking time to come for your Medicare Wellness Visit. I appreciate your ongoing commitment to your health goals. Please review the following plan we discussed and let me know if I can assist you in the future.   Screening recommendations/referrals: Colonoscopy: not required Mammogram: completed 10/28/2020 Bone Density: due Recommended yearly ophthalmology/optometry visit for glaucoma screening and checkup Recommended yearly dental visit for hygiene and checkup  Vaccinations: Influenza vaccine: scheduled 07/11/2021 Pneumococcal vaccine: completed 03/25/2014 Tdap vaccine: completed 03/25/2014, due 03/25/2024 Shingles vaccine: discussed   Covid-19: 07/22/2020, 10/24/2019, 10/03/2019  Advanced directives: Advance directive discussed with you today.  Conditions/risks identified: none  Next appointment: Follow up in one year for your annual wellness visit    Preventive Care 65 Years and Older, Female Preventive care refers to lifestyle choices and visits with your health care provider that can promote health and wellness. What does preventive care include? A yearly physical exam. This is also called an annual well check. Dental exams once or twice a year. Routine eye exams. Ask your health care provider how often you should have your eyes checked. Personal lifestyle choices, including: Daily care of your teeth and gums. Regular physical activity. Eating a healthy diet. Avoiding tobacco and drug use. Limiting alcohol use. Practicing safe sex. Taking low-dose aspirin every day. Taking vitamin and mineral supplements as recommended by your health care provider. What happens during an annual well check? The services and screenings done by your health care provider during your annual well check will depend on your age, overall health, lifestyle risk factors, and family history of disease. Counseling  Your health care provider may ask you questions about your: Alcohol  use. Tobacco use. Drug use. Emotional well-being. Home and relationship well-being. Sexual activity. Eating habits. History of falls. Memory and ability to understand (cognition). Work and work Statistician. Reproductive health. Screening  You may have the following tests or measurements: Height, weight, and BMI. Blood pressure. Lipid and cholesterol levels. These may be checked every 5 years, or more frequently if you are over 77 years old. Skin check. Lung cancer screening. You may have this screening every year starting at age 8 if you have a 30-pack-year history of smoking and currently smoke or have quit within the past 15 years. Fecal occult blood test (FOBT) of the stool. You may have this test every year starting at age 29. Flexible sigmoidoscopy or colonoscopy. You may have a sigmoidoscopy every 5 years or a colonoscopy every 10 years starting at age 7. Hepatitis C blood test. Hepatitis B blood test. Sexually transmitted disease (STD) testing. Diabetes screening. This is done by checking your blood sugar (glucose) after you have not eaten for a while (fasting). You may have this done every 1-3 years. Bone density scan. This is done to screen for osteoporosis. You may have this done starting at age 45. Mammogram. This may be done every 1-2 years. Talk to your health care provider about how often you should have regular mammograms. Talk with your health care provider about your test results, treatment options, and if necessary, the need for more tests. Vaccines  Your health care provider may recommend certain vaccines, such as: Influenza vaccine. This is recommended every year. Tetanus, diphtheria, and acellular pertussis (Tdap, Td) vaccine. You may need a Td booster every 10 years. Zoster vaccine. You may need this after age 27. Pneumococcal 13-valent conjugate (PCV13) vaccine. One dose is recommended after age 38. Pneumococcal polysaccharide (PPSV23) vaccine. One dose is  recommended after age 63. Talk to your health care provider about which screenings and vaccines you need and how often you need them. This information is not intended to replace advice given to you by your health care provider. Make sure you discuss any questions you have with your health care provider. Document Released: 10/01/2015 Document Revised: 05/24/2016 Document Reviewed: 07/06/2015 Elsevier Interactive Patient Education  2017 [place] Prevention in the Home Falls can cause injuries. They can happen to people of all ages. There are many things you can do to make your home safe and to help prevent falls. What can I do on the outside of my home? Regularly fix the edges of walkways and driveways and fix any cracks. Remove anything that might make you trip as you walk through a door, such as a raised step or threshold. Trim any bushes or trees on the path to your home. Use bright outdoor lighting. Clear any walking paths of anything that might make someone trip, such as rocks or tools. Regularly check to see if handrails are loose or broken. Make sure that both sides of any steps have handrails. Any raised decks and porches should have guardrails on the edges. Have any leaves, snow, or ice cleared regularly. Use sand or salt on walking paths during winter. Clean up any spills in your garage right away. This includes oil or grease spills. What can I do in the bathroom? Use night lights. Install grab bars by the toilet and in the tub and shower. Do not use towel bars as grab bars. Use non-skid mats or decals in the tub or shower. If you need to sit down in the shower, use a plastic, non-slip stool. Keep the floor dry. Clean up any water that spills on the floor as soon as it happens. Remove soap buildup in the tub or shower regularly. Attach bath mats securely with double-sided non-slip rug tape. Do not have throw rugs and other things on the floor that can make you  trip. What can I do in the bedroom? Use night lights. Make sure that you have a light by your bed that is easy to reach. Do not use any sheets or blankets that are too big for your bed. They should not hang down onto the floor. Have a firm chair that has side arms. You can use this for support while you get dressed. Do not have throw rugs and other things on the floor that can make you trip. What can I do in the kitchen? Clean up any spills right away. Avoid walking on wet floors. Keep items that you use a lot in easy-to-reach places. If you need to reach something above you, use a strong step stool that has a grab bar. Keep electrical cords out of the way. Do not use floor polish or wax that makes floors slippery. If you must use wax, use non-skid floor wax. Do not have throw rugs and other things on the floor that can make you trip. What can I do with my stairs? Do not leave any items on the stairs. Make sure that there are handrails on both sides of the stairs and use them. Fix handrails that are broken or loose. Make sure that handrails are as long as the stairways. Check any carpeting to make sure that it is firmly attached to the stairs. Fix any carpet that is loose or worn. Avoid having throw rugs at the top or bottom of the stairs. If you  do have throw rugs, attach them to the floor with carpet tape. Make sure that you have a light switch at the top of the stairs and the bottom of the stairs. If you do not have them, ask someone to add them for you. What else can I do to help prevent falls? Wear shoes that: Do not have high heels. Have rubber bottoms. Are comfortable and fit you well. Are closed at the toe. Do not wear sandals. If you use a stepladder: Make sure that it is fully opened. Do not climb a closed stepladder. Make sure that both sides of the stepladder are locked into place. Ask someone to hold it for you, if possible. Clearly mark and make sure that you can  see: Any grab bars or handrails. First and last steps. Where the edge of each step is. Use tools that help you move around (mobility aids) if they are needed. These include: Canes. Walkers. Scooters. Crutches. Turn on the lights when you go into a dark area. Replace any light bulbs as soon as they burn out. Set up your furniture so you have a clear path. Avoid moving your furniture around. If any of your floors are uneven, fix them. If there are any pets around you, be aware of where they are. Review your medicines with your doctor. Some medicines can make you feel dizzy. This can increase your chance of falling. Ask your doctor what other things that you can do to help prevent falls. This information is not intended to replace advice given to you by your health care provider. Make sure you discuss any questions you have with your health care provider. Document Released: 07/01/2009 Document Revised: 02/10/2016 Document Reviewed: 10/09/2014 Elsevier Interactive Patient Education  2017 [employer].

## 2021-06-16 ENCOUNTER — Other Ambulatory Visit: Payer: Self-pay | Admitting: Family Medicine

## 2021-06-16 DIAGNOSIS — I10 Essential (primary) hypertension: Secondary | ICD-10-CM

## 2021-06-21 DIAGNOSIS — M25473 Effusion, unspecified ankle: Secondary | ICD-10-CM | POA: Diagnosis not present

## 2021-06-21 DIAGNOSIS — M419 Scoliosis, unspecified: Secondary | ICD-10-CM | POA: Diagnosis not present

## 2021-06-21 DIAGNOSIS — Z0000 Encounter for general adult medical examination without abnormal findings: Secondary | ICD-10-CM | POA: Diagnosis not present

## 2021-06-21 DIAGNOSIS — I10 Essential (primary) hypertension: Secondary | ICD-10-CM | POA: Diagnosis not present

## 2021-06-21 DIAGNOSIS — I208 Other forms of angina pectoris: Secondary | ICD-10-CM | POA: Diagnosis not present

## 2021-06-21 DIAGNOSIS — J984 Other disorders of lung: Secondary | ICD-10-CM | POA: Diagnosis not present

## 2021-06-21 DIAGNOSIS — E782 Mixed hyperlipidemia: Secondary | ICD-10-CM | POA: Diagnosis not present

## 2021-06-22 ENCOUNTER — Other Ambulatory Visit: Payer: Self-pay

## 2021-06-22 ENCOUNTER — Ambulatory Visit
Admission: RE | Admit: 2021-06-22 | Discharge: 2021-06-22 | Disposition: A | Discharge status: Home / Self Care | Payer: Medicare Other | Source: Ambulatory Visit | Attending: Specialist | Admitting: Specialist

## 2021-06-22 DIAGNOSIS — R918 Other nonspecific abnormal finding of lung field: Secondary | ICD-10-CM | POA: Diagnosis not present

## 2021-06-22 DIAGNOSIS — R911 Solitary pulmonary nodule: Secondary | ICD-10-CM | POA: Insufficient documentation

## 2021-06-22 DIAGNOSIS — I700 Atherosclerosis of aorta: Secondary | ICD-10-CM | POA: Diagnosis not present

## 2021-07-11 ENCOUNTER — Other Ambulatory Visit: Payer: Self-pay

## 2021-07-11 ENCOUNTER — Ambulatory Visit (INDEPENDENT_AMBULATORY_CARE_PROVIDER_SITE_OTHER): Payer: Medicare Other

## 2021-07-11 VITALS — Wt 140.0 lb

## 2021-07-11 DIAGNOSIS — Z23 Encounter for immunization: Secondary | ICD-10-CM

## 2021-07-22 ENCOUNTER — Other Ambulatory Visit: Payer: Self-pay | Admitting: Specialist

## 2021-07-22 DIAGNOSIS — E041 Nontoxic single thyroid nodule: Secondary | ICD-10-CM

## 2021-07-27 ENCOUNTER — Other Ambulatory Visit: Payer: Self-pay | Admitting: Family Medicine

## 2021-07-27 ENCOUNTER — Other Ambulatory Visit: Payer: Self-pay

## 2021-07-27 ENCOUNTER — Encounter: Payer: Self-pay | Admitting: Internal Medicine

## 2021-07-27 ENCOUNTER — Ambulatory Visit (INDEPENDENT_AMBULATORY_CARE_PROVIDER_SITE_OTHER): Payer: Medicare Other | Admitting: Internal Medicine

## 2021-07-27 VITALS — BP 120/67 | HR 73 | Temp 97.5°F | Resp 16 | Ht 63.0 in | Wt 143.4 lb

## 2021-07-27 DIAGNOSIS — I10 Essential (primary) hypertension: Secondary | ICD-10-CM

## 2021-07-27 DIAGNOSIS — R3989 Other symptoms and signs involving the genitourinary system: Secondary | ICD-10-CM

## 2021-07-27 DIAGNOSIS — R300 Dysuria: Secondary | ICD-10-CM | POA: Diagnosis not present

## 2021-07-27 DIAGNOSIS — N3000 Acute cystitis without hematuria: Secondary | ICD-10-CM | POA: Diagnosis not present

## 2021-07-27 LAB — POCT URINALYSIS DIPSTICK
Bilirubin, UA: NEGATIVE
Glucose, UA: NEGATIVE
Ketones, UA: NEGATIVE
Nitrite, UA: NEGATIVE
Protein, UA: NEGATIVE
Spec Grav, UA: 1.02 (ref 1.010–1.025)
Urobilinogen, UA: 0.2 E.U./dL
pH, UA: 5 (ref 5.0–8.0)

## 2021-07-27 MED ORDER — NITROFURANTOIN MONOHYD MACRO 100 MG PO CAPS: 100.0000 mg | ORAL_CAPSULE | Freq: Two times a day (BID) | ORAL | 0 refills | Status: DC

## 2021-07-27 NOTE — Patient Instructions (Signed)

## 2021-07-27 NOTE — Progress Notes (Signed)
HPI  Pt presents to the clinic today with c/o bladder pressure and dysuria. She reports her symptoms started this morning. She denies urgency, frequency, blood in her urine, vaginal symptoms, fever, chills, nausea or low back pain. She has not tried anything OTC for her symptoms.    Review of Systems  Past Medical History:  Diagnosis Date   Anemia    Breast cancer ([place]) 09/2014   left breast lumpectomy with rad tx   Hyperlipidemia    Malignant neoplasm of lower-inner quadrant of left female breast ([place]) 07/2014   5 mm,T1a, N0; ER/ PR positive, HER-2/neu negative   Osteoporosis    Personal history of radiation therapy 2016   left breast ca    Family History  Problem Relation Age of Onset   Breast cancer Sister 57   Heart disease Mother    Heart disease Father     Social History   Socioeconomic History   Marital status: Married    Spouse name: Not on file   Number of children: Not on file   Years of education: Not on file   Highest education level: Bachelor's degree (e.g., BA, AB, BS)  Occupational History   Not on file  Tobacco Use   Smoking status: Never   Smokeless tobacco: Never  Vaping Use   Vaping Use: Never used  Substance and Sexual Activity   Alcohol use: No    Alcohol/week: 0.0 standard drinks   Drug use: No   Sexual activity: Not on file  Other Topics Concern   Not on file  Social History Narrative   Not on file   Social Determinants of Health   Financial Resource Strain: Low Risk    Difficulty of Paying Living Expenses: Not hard at all  Food Insecurity: No Food Insecurity   Worried About Charity fundraiser in the Last Year: Never true   Ran Out of Food in the Last Year: Never true  Transportation Needs: No Transportation Needs   Lack of Transportation (Medical): No   Lack of Transportation (Non-Medical): No  Physical Activity: Sufficiently Active   Days of Exercise per Week: 2 days   Minutes of Exercise per Session: 90 min  Stress: No Stress  Concern Present   Feeling of Stress : Not at all  Social Connections: Not on file  Intimate Partner Violence: Not on file    No Known Allergies   Constitutional: Denies fever, malaise, fatigue, headache or abrupt weight changes.   GU: Pt reports bladder pressure and pain with urination. Denies urgency, frequency, burning sensation, blood in urine, odor or discharge. Skin: Denies redness, rashes, lesions or ulcercations.   No other specific complaints in a complete review of systems (except as listed in HPI above).    Objective:   Physical Exam BP 120/67 (BP Location: Right Arm, Patient Position: Sitting, Cuff Size: Normal)   Pulse 73   Temp (!) 97.5 F (36.4 C) (Temporal)   Resp 16   Ht '5\' 3"'  (1.6 m)   Wt 143 lb 6.4 oz (65 kg)   SpO2 99%   BMI 25.40 kg/m   Wt Readings from Last 3 Encounters:  07/11/21 140 lb (63.5 kg)  06/14/21 140 lb (63.5 kg)  05/24/21 140 lb 3.2 oz (63.6 kg)    General: Appears her stated age, well developed, well nourished in NAD. Cardiovascular: Normal rate and rhythm. S1,S2 noted.   Pulmonary/Chest: Normal effort and positive vesicular breath sounds. No respiratory distress. No wheezes, rales or ronchi  noted.  Abdomen: Soft and nontender. Normal bowel sounds. No distention or masses noted.   No CVA tenderness.        Assessment & Plan:   Bladder Pressure, Dysuria secondary to UTI  Urinalysis: 3+ leuks, 3+ blood Will send urine culture eRx sent if for Macrobid 100 mg BID x 5 days OK to take AZO OTC Drink plenty of fluids  RTC as needed or if symptoms persist. Webb Silversmith, NP This visit occurred during the SARS-CoV-2 public health emergency.  Safety protocols were in place, including screening questions prior to the visit, additional usage of staff PPE, and extensive cleaning of exam room while observing appropriate contact time as indicated for disinfecting solutions.

## 2021-07-27 NOTE — Telephone Encounter (Signed)
Medication refilled 03/31/2021  #90 with 3 refills.

## 2021-07-28 NOTE — Telephone Encounter (Signed)
Patient called in meed Rx today sent to  [facility], [address] AT [place] Phone:  [phone]  Fax:  [phone]

## 2021-07-28 NOTE — Telephone Encounter (Signed)
Call to patient- need to verify pharmacy- original Rx sent to mail order- request today is local. Left message to return call to office.

## 2021-07-29 LAB — URINE CULTURE
MICRO NUMBER:: 12617829
SPECIMEN QUALITY:: ADEQUATE

## 2021-08-01 ENCOUNTER — Other Ambulatory Visit: Payer: Self-pay

## 2021-08-01 ENCOUNTER — Ambulatory Visit
Admission: RE | Admit: 2021-08-01 | Discharge: 2021-08-01 | Disposition: A | Discharge status: Home / Self Care | Payer: Medicare Other | Source: Ambulatory Visit | Attending: Specialist | Admitting: Specialist

## 2021-08-01 DIAGNOSIS — E042 Nontoxic multinodular goiter: Secondary | ICD-10-CM | POA: Diagnosis not present

## 2021-08-01 DIAGNOSIS — E041 Nontoxic single thyroid nodule: Secondary | ICD-10-CM | POA: Diagnosis not present

## 2021-08-24 DIAGNOSIS — E042 Nontoxic multinodular goiter: Secondary | ICD-10-CM | POA: Diagnosis not present

## 2021-08-24 DIAGNOSIS — M810 Age-related osteoporosis without current pathological fracture: Secondary | ICD-10-CM | POA: Diagnosis not present

## 2021-09-06 DIAGNOSIS — E042 Nontoxic multinodular goiter: Secondary | ICD-10-CM | POA: Diagnosis not present

## 2021-09-07 DIAGNOSIS — E041 Nontoxic single thyroid nodule: Secondary | ICD-10-CM | POA: Diagnosis not present

## 2021-09-22 DIAGNOSIS — H524 Presbyopia: Secondary | ICD-10-CM | POA: Diagnosis not present

## 2021-09-22 DIAGNOSIS — H5203 Hypermetropia, bilateral: Secondary | ICD-10-CM | POA: Diagnosis not present

## 2021-09-22 DIAGNOSIS — H2513 Age-related nuclear cataract, bilateral: Secondary | ICD-10-CM | POA: Diagnosis not present

## 2021-09-22 DIAGNOSIS — H43813 Vitreous degeneration, bilateral: Secondary | ICD-10-CM | POA: Diagnosis not present

## 2021-09-22 DIAGNOSIS — H33311 Horseshoe tear of retina without detachment, right eye: Secondary | ICD-10-CM | POA: Diagnosis not present

## 2021-09-22 DIAGNOSIS — H52223 Regular astigmatism, bilateral: Secondary | ICD-10-CM | POA: Diagnosis not present

## 2021-09-22 DIAGNOSIS — H18892 Other specified disorders of cornea, left eye: Secondary | ICD-10-CM | POA: Diagnosis not present

## 2021-09-26 ENCOUNTER — Other Ambulatory Visit: Payer: Self-pay | Admitting: General Surgery

## 2021-09-26 DIAGNOSIS — Z1231 Encounter for screening mammogram for malignant neoplasm of breast: Secondary | ICD-10-CM

## 2021-10-06 ENCOUNTER — Encounter: Payer: Self-pay | Admitting: Internal Medicine

## 2021-10-06 ENCOUNTER — Ambulatory Visit: Payer: Self-pay | Admitting: *Deleted

## 2021-10-06 ENCOUNTER — Ambulatory Visit (INDEPENDENT_AMBULATORY_CARE_PROVIDER_SITE_OTHER): Payer: Medicare Other | Admitting: Internal Medicine

## 2021-10-06 DIAGNOSIS — J069 Acute upper respiratory infection, unspecified: Secondary | ICD-10-CM

## 2021-10-06 MED ORDER — PREDNISONE 10 MG PO TABS: ORAL_TABLET | ORAL | 0 refills | Status: DC

## 2021-10-06 MED ORDER — BENZONATATE 200 MG PO CAPS: 200.0000 mg | ORAL_CAPSULE | Freq: Three times a day (TID) | ORAL | 0 refills | Status: DC | PRN

## 2021-10-06 NOTE — Telephone Encounter (Signed)
°  Chief Complaint: cough Symptoms: cough, cloudy sputum Frequency: started this past weekend- OTC not helping Pertinent Negatives: Patient denies fever Disposition: [] ED /[] Urgent Care (no appt availability in office) / [x] Appointment(In office/virtual)/ []  [place] [facility]/ [] Home Care/ [] Refused Recommended Disposition /[] [place] [facility]/ []  Follow-up with PCP Additional Notes:

## 2021-10-06 NOTE — Progress Notes (Signed)
Virtual Visit via Telephone Note  I connected with Rebecca Mitchell on 10/06/21 at  4:00 PM EST by telephone and verified that I am speaking with the correct person using two identifiers.  Location: Patient: Home Provider: Office  Person's participating in this telephone call: Webb Silversmith, NP and Mindi Curling.   I discussed the limitations, risks, security and privacy concerns of performing an evaluation and management service by telephone and the availability of in person appointments. I also discussed with the patient that there may be a patient responsible charge related to this service. The patient expressed understanding and agreed to proceed.   History of Present Illness:  Pt reports nasal congestion, scratchy throat, and cough. This started 5 days ago. She is blowing clear mucous out of her nose. She denies difficulty swallowing. The cough is productive of cloudy muscous.  She has some intermittent shortness of breath.  She denies headache, dizziness, vision changes, runny nose, ear pain, sore throat, chest pain, nausea, vomiting, diarrhea, fever, chills or body aches.  She has not had sick contacts that she is aware of.  She has tried Dextromethorphan OTC with minimal relief of symptoms.  She has no history of asthma or COPD.  She took a COVID test at home which was negative.   Past Medical History:  Diagnosis Date   Anemia    Breast cancer ([place]) 09/2014   left breast lumpectomy with rad tx   Hyperlipidemia    Malignant neoplasm of lower-inner quadrant of left female breast ([place]) 07/2014   5 mm,T1a, N0; ER/ PR positive, HER-2/neu negative   Osteoporosis    Personal history of radiation therapy 2016   left breast ca    Current Outpatient Medications  Medication Sig Dispense Refill   benzonatate (TESSALON) 200 MG capsule Take 1 capsule (200 mg total) by mouth 3 (three) times daily as needed for cough. 30 capsule 0   predniSONE (DELTASONE) 10 MG tablet Take 6 tabs on day 1, 5 tabs  on day 2, 4 tabs on day 3, 3 tabs on day 4, 2 tabs on day 5, 1 tab on day 6 21 tablet 0   albuterol (VENTOLIN HFA) 108 (90 Base) MCG/ACT inhaler      amLODipine (NORVASC) 5 MG tablet TAKE 1 TABLET BY MOUTH  DAILY 90 tablet 3   Ascorbic Acid (VITAMIN C) 1000 MG tablet Take 1,000 mg by mouth daily.     aspirin EC 81 MG tablet Take 81 mg by mouth daily.     calcium carbonate 100 mg/ml SUSP Take by mouth.     Cholecalciferol (VITAMIN D3) 10 MCG (400 UNIT) CAPS Take by mouth.     co-enzyme Q-10 30 MG capsule Take 30 mg by mouth 3 (three) times daily.     FERROUS SULFATE PO Take 1 tablet by mouth once a week.     fluticasone (FLONASE) 50 MCG/ACT nasal spray USE 2 SPRAYS IN EACH NOSTRIL ONCE DAILY FOR UP TO 4 TO 6 WEEKS, THEN AS NEEDED FOR ALLERGIES 16 g 5   loratadine (CLARITIN) 10 MG tablet Take 1 tablet (10 mg total) by mouth daily. For up to 1 month, then as needed for seasonal allergies. 30 tablet 3   losartan-hydrochlorothiazide (HYZAAR) 100-25 MG tablet TAKE 1 TABLET BY MOUTH DAILY 90 tablet 3   Lutein-Zeaxanthin 15-0.7 MG CAPS Take 1 capsule by mouth daily.     metoprolol tartrate (LOPRESSOR) 25 MG tablet TAKE 1 TABLET BY MOUTH  TWICE DAILY 180 tablet  1   Multiple Vitamin (MULTIVITAMIN) tablet Take 1 tablet by mouth daily.     nitrofurantoin, macrocrystal-monohydrate, (MACROBID) 100 MG capsule Take 1 capsule (100 mg total) by mouth 2 (two) times daily. 10 capsule 0   Omega-3 Fatty Acids (FISH OIL PO) Take 1 capsule by mouth daily.     pravastatin (PRAVACHOL) 20 MG tablet TAKE 1 TABLET BY MOUTH  DAILY 90 tablet 3   Saccharomyces boulardii (PROBIOTIC) 250 MG CAPS Take 250 mg by mouth daily.     zoledronic acid (RECLAST) 5 MG/100ML SOLN injection Inject 5 mg into the vein once.     No current facility-administered medications for this visit.    No Known Allergies  Family History  Problem Relation Age of Onset   Breast cancer Sister 17   Heart disease Mother    Heart disease Father      Social History   Socioeconomic History   Marital status: Married    Spouse name: Not on file   Number of children: Not on file   Years of education: Not on file   Highest education level: Bachelor's degree (e.g., BA, AB, BS)  Occupational History   Not on file  Tobacco Use   Smoking status: Never   Smokeless tobacco: Never  Vaping Use   Vaping Use: Never used  Substance and Sexual Activity   Alcohol use: No    Alcohol/week: 0.0 standard drinks   Drug use: No   Sexual activity: Not on file  Other Topics Concern   Not on file  Social History Narrative   Not on file   Social Determinants of Health   Financial Resource Strain: Low Risk    Difficulty of Paying Living Expenses: Not hard at all  Food Insecurity: No Food Insecurity   Worried About Charity fundraiser in the Last Year: Never true   Ran Out of Food in the Last Year: Never true  Transportation Needs: No Transportation Needs   Lack of Transportation (Medical): No   Lack of Transportation (Non-Medical): No  Physical Activity: Sufficiently Active   Days of Exercise per Week: 2 days   Minutes of Exercise per Session: 90 min  Stress: No Stress Concern Present   Feeling of Stress : Not at all  Social Connections: Not on file  Intimate Partner Violence: Not on file     Constitutional: Denies fever, malaise, fatigue, headache or abrupt weight changes.  HEENT: Patient reports nasal congestion, scratchy throat. Denies eye pain, eye redness, ear pain, ringing in the ears, wax buildup, runny nose,  bloody nose, or sore throat. Respiratory: Patient reports cough and shortness of breath.  Denies difficulty breathing, or sputum production.   Cardiovascular: Denies chest pain, chest tightness, palpitations or swelling in the hands or feet.  Gastrointestinal: Denies abdominal pain, bloating, constipation, diarrhea or blood in the stool.   No other specific complaints in a complete review of systems (except as listed in  HPI above).   Observations/Objective:   Wt Readings from Last 3 Encounters:  07/27/21 143 lb 6.4 oz (65 kg)  07/11/21 140 lb (63.5 kg)  06/14/21 140 lb (63.5 kg)    General: In NAD. HEENT: Nose: Congestion noted; Throat/Mouth: Hoarseness noted.  Pulmonary/Chest: Dry cough noted.  No respiratory distress.   BMET    Component Value Date/Time   NA 140 05/17/2021 0757   NA 142 02/08/2016 0820   K 3.8 05/17/2021 0757   K 4.1 09/21/2014 1028   CL 104 05/17/2021 0757  CO2 28 05/17/2021 0757   GLUCOSE 97 05/17/2021 0757   BUN 21 05/17/2021 0757   BUN 22 02/08/2016 0820   CREATININE 0.72 05/17/2021 0757   CALCIUM 9.9 05/17/2021 0757   GFRNONAA 85 05/10/2020 0830   GFRAA 98 05/10/2020 0830    Lipid Panel     Component Value Date/Time   CHOL 157 05/17/2021 0757   CHOL 165 02/08/2016 0820   TRIG 82 05/17/2021 0757   HDL 68 05/17/2021 0757   HDL 79 02/08/2016 0820   CHOLHDL 2.3 05/17/2021 0757   VLDL 12 12/20/2016 0001   LDLCALC 73 05/17/2021 0757    CBC    Component Value Date/Time   WBC 2.7 (L) 05/17/2021 0757   RBC 3.11 (L) 05/17/2021 0757   HGB 11.2 (L) 05/17/2021 0757   HGB 12.5 02/08/2016 0820   HCT 34.6 (L) 05/17/2021 0757   HCT 36.4 02/08/2016 0820   PLT 195 05/17/2021 0757   PLT 188 02/08/2016 0820   MCV 111.3 (H) 05/17/2021 0757   MCV 92 02/08/2016 0820   MCH 36.0 (H) 05/17/2021 0757   MCHC 32.4 05/17/2021 0757   RDW 12.5 05/17/2021 0757   RDW 13.4 02/08/2016 0820   LYMPHSABS 969 05/17/2021 0757   LYMPHSABS 1.3 02/08/2016 0820   MONOABS 504 12/20/2016 0001   EOSABS 119 05/17/2021 0757   EOSABS 0.2 02/08/2016 0820   BASOSABS 30 05/17/2021 0757   BASOSABS 0.1 02/08/2016 0820    Hgb A1C Lab Results  Component Value Date   HGBA1C 5.5 05/17/2021       Assessment and Plan:  Nasal Congestion, Cough, Shortness of Breath:  No indication for antibiotics at this time Rx for Pred taper x6 days Rx for Tessalon Perles 200 mg 3 times a day as  needed Advised her if she was worse over the weekend to seek care at urgent care If no improvement by Monday, would consider Azithromycin x5 days  Follow Up Instructions:    I discussed the assessment and treatment plan with the patient. The patient was provided an opportunity to ask questions and all were answered. The patient agreed with the plan and demonstrated an understanding of the instructions.   The patient was advised to call back or seek an in-person evaluation if the symptoms worsen or if the condition fails to improve as anticipated.  I provided 5:37 minutes of non-face-to-face time during this encounter.   Webb Silversmith, NP

## 2021-10-06 NOTE — Patient Instructions (Signed)

## 2021-10-06 NOTE — Telephone Encounter (Signed)
Summary: cough almost a week getting worst   Patient called in trying to get an appointment with Dr Raliegh Ip for a deep cough she have been having almost a week say that it has now gotten into her chest and she is concerned. Per patient she have used several OTC medication with no ease please call Ph# [phone]      Reason for Disposition  [1] Continuous (nonstop) coughing interferes with work or school AND [2] no improvement using cough treatment per Care Advice  Answer Assessment - Initial Assessment Questions 1. ONSET: "When did the cough begin?"      Started over the week end 2. SEVERITY: "How bad is the cough today?"      Cough all afternoon - herd to control 3. SPUTUM: "Describe the color of your sputum" (none, dry cough; clear, white, yellow, green)     cloudy 4. HEMOPTYSIS: "Are you coughing up any blood?" If so ask: "How much?" (flecks, streaks, tablespoons, etc.)     no 5. DIFFICULTY BREATHING: "Are you having difficulty breathing?" If Yes, ask: "How bad is it?" (e.g., mild, moderate, severe)    - MILD: No SOB at rest, mild SOB with walking, speaks normally in sentences, can lie down, no retractions, pulse < 100.    - MODERATE: SOB at rest, SOB with minimal exertion and prefers to sit, cannot lie down flat, speaks in phrases, mild retractions, audible wheezing, pulse 100-120.    - SEVERE: Very SOB at rest, speaks in single words, struggling to breathe, sitting hunched forward, retractions, pulse > 120      At times hard to get breath from coughing- can feel herself breathing- almost like asthma 6. FEVER: "Do you have a fever?" If Yes, ask: "What is your temperature, how was it measured, and when did it start?"     no 7. CARDIAC HISTORY: "Do you have any history of heart disease?" (e.g., heart attack, congestive heart failure)      *No Answer* 8. LUNG HISTORY: "Do you have any history of lung disease?"  (e.g., pulmonary embolus, asthma, emphysema)     *No Answer* 9. PE RISK FACTORS:  "Do you have a history of blood clots?" (or: recent major surgery, recent prolonged travel, bedridden)     *No Answer* 10. OTHER SYMPTOMS: "Do you have any other symptoms?" (e.g., runny nose, wheezing, chest pain)       Some nasal congestion 11. PREGNANCY: "Is there any chance you are pregnant?" "When was your last menstrual period?"       *No Answer* 12. TRAVEL: "Have you traveled out of the country in the last month?" (e.g., travel history, exposures)       *No Answer*  Protocols used: Cough - Acute Productive-A-AH

## 2021-10-10 DIAGNOSIS — M810 Age-related osteoporosis without current pathological fracture: Secondary | ICD-10-CM | POA: Diagnosis not present

## 2021-10-17 DIAGNOSIS — E042 Nontoxic multinodular goiter: Secondary | ICD-10-CM | POA: Diagnosis not present

## 2021-10-17 DIAGNOSIS — M810 Age-related osteoporosis without current pathological fracture: Secondary | ICD-10-CM | POA: Diagnosis not present

## 2021-11-04 ENCOUNTER — Ambulatory Visit
Admission: RE | Admit: 2021-11-04 | Discharge: 2021-11-04 | Disposition: A | Discharge status: Home / Self Care | Payer: Medicare Other | Source: Ambulatory Visit | Attending: General Surgery | Admitting: General Surgery

## 2021-11-04 ENCOUNTER — Other Ambulatory Visit: Payer: Self-pay

## 2021-11-04 DIAGNOSIS — Z1231 Encounter for screening mammogram for malignant neoplasm of breast: Secondary | ICD-10-CM | POA: Diagnosis not present

## 2021-11-07 ENCOUNTER — Ambulatory Visit: Payer: Self-pay

## 2021-11-07 ENCOUNTER — Encounter: Payer: Self-pay | Admitting: Family Medicine

## 2021-11-07 ENCOUNTER — Ambulatory Visit (INDEPENDENT_AMBULATORY_CARE_PROVIDER_SITE_OTHER): Payer: Medicare Other | Admitting: Family Medicine

## 2021-11-07 ENCOUNTER — Other Ambulatory Visit: Payer: Self-pay

## 2021-11-07 VITALS — Wt 143.0 lb

## 2021-11-07 DIAGNOSIS — R300 Dysuria: Secondary | ICD-10-CM | POA: Diagnosis not present

## 2021-11-07 DIAGNOSIS — N3001 Acute cystitis with hematuria: Secondary | ICD-10-CM | POA: Diagnosis not present

## 2021-11-07 LAB — POCT URINALYSIS DIPSTICK
Bilirubin, UA: NEGATIVE
Glucose, UA: NEGATIVE
Ketones, UA: NEGATIVE
Nitrite, UA: NEGATIVE
Protein, UA: POSITIVE — AB
Spec Grav, UA: 1.015 (ref 1.010–1.025)
Urobilinogen, UA: 0.2 E.U./dL
pH, UA: 7 (ref 5.0–8.0)

## 2021-11-07 MED ORDER — CEPHALEXIN 500 MG PO CAPS: 500.0000 mg | ORAL_CAPSULE | Freq: Three times a day (TID) | ORAL | 0 refills | Status: DC

## 2021-11-07 NOTE — Telephone Encounter (Signed)
Patient called in and stated that she have been having pressure in her abdomen area and was going back and forth to the bathroom every two hrs all night and want to go to the office and leave a urine sample. Per patient she is holding her urine so she can go in and leave the sample. Please call patient at Ph#  [phone]     Chief Complaint: Frequency, back pain Symptoms: Above Frequency: Started last night Pertinent Negatives: Patient denies burning Disposition: [] ED /[] Urgent Care (no appt availability in office) / [] Appointment(In office/virtual)/ []  [place] [facility]/ [] Home Care/ [] Refused Recommended Disposition /[] [place] [facility]/ []  Follow-up with PCP Additional Notes: Rachel scheduled Virtual visit for today.  Answer Assessment - Initial Assessment Questions 1. SYMPTOM: "What's the main symptom you're concerned about?" (e.g., frequency, incontinence)     Frequency 2. ONSET: "When did the  symptoms  start?"     Last night 3. PAIN: "Is there any pain?" If Yes, ask: "How bad is it?" (Scale: 1-10; mild, moderate, severe)     Now - Mild pressure 4. CAUSE: "What do you think is causing the symptoms?"     UTI 5. OTHER SYMPTOMS: "Do you have any other symptoms?" (e.g., fever, flank pain, blood in urine, pain with urination)     Frequency and back pain 6. PREGNANCY: "Is there any chance you are pregnant?" "When was your last menstrual period?"     No  Protocols used: Urinary Symptoms-A-AH

## 2021-11-07 NOTE — Patient Instructions (Signed)
1. You have a Urinary Tract Infection - this is very common, your symptoms are reassuring and you should get better within 1 week on the antibiotics - Start Keflex 500mg  3 times daily for next 7 days, complete entire course, even if feeling better - We sent urine for a culture, we will call you within next few days if we need to change antibiotics - Please drink plenty of fluids, improve hydration over next 1 week  If symptoms worsening, developing nausea / vomiting, worsening back pain, fevers / chills / sweats, then please return for re-evaluation sooner.  If you take AZO OTC - limit this to 2-3 days MAX to avoid affecting kidneys  D-Mannose is a natural supplement that can actually help bind to urinary bacteria and reduce their effectiveness it can help prevent UTI from forming, and may reduce some symptoms. It likely cannot cure an active UTI but it is worth a try and good to prevent them with. Try 500mg  twice a day at a full dose if you want, or check package instructions for more info   Please schedule a Follow-up Appointment to: No follow-ups on file.  If you have any other questions or concerns, please feel free to call the office or send a message through [facility] [place]. You may also schedule an earlier appointment if necessary.  Additionally, you may be receiving a survey about your experience at our office within a few days to 1 week by e-mail or mail. We value your feedback.  Nobie Putnam, DO [place]

## 2021-11-07 NOTE — Progress Notes (Signed)
Virtual Visit via Telephone The purpose of this virtual visit is to provide medical care while limiting exposure to the novel coronavirus (COVID19) for both patient and office staff.  Consent was obtained for phone visit:  Yes.   Answered questions that patient had about telehealth interaction:  Yes.   I discussed the limitations, risks, security and privacy concerns of performing an evaluation and management service by telephone. I also discussed with the patient that there may be a patient responsible charge related to this service. The patient expressed understanding and agreed to proceed.  Patient Location: Home Provider Location: [facility] (Office)  Participants in virtual visit: - Patient: Rebecca Mitchell  - CMA: Orinda Kenner, [place] - Provider: Dr Parks Ranger  ---------------------------------------------------------------------- Chief Complaint  Patient presents with   Dysuria    S: Reviewed CMA documentation. I have called patient and gathered additional HPI as follows:  Acute Cystitis UTI Reports that symptoms started within past 1 week similar to prior UTI, with dysuria and urinary pressure. Previous UTI 07/2021 treated with Macrobid for Enterococcus F, and also previously 01/2021 treated with Keflex for Klebsiella Has symptoms similar to previous infection Tolerating PO without issue  Denies any fevers, chills, sweats, body ache, cough, shortness of breath, sinus pain or pressure, headache, abdominal pain, diarrhea  Past Medical History:  Diagnosis Date   Anemia    Breast cancer ([place]) 09/2014   left breast lumpectomy with rad tx   Hyperlipidemia    Malignant neoplasm of lower-inner quadrant of left female breast ([place]) 07/2014   5 mm,T1a, N0; ER/ PR positive, HER-2/neu negative   Osteoporosis    Personal history of radiation therapy 2016   left breast ca   Social History   Tobacco Use   Smoking status: Never   Smokeless tobacco: Never  Vaping  Use   Vaping Use: Never used  Substance Use Topics   Alcohol use: No    Alcohol/week: 0.0 standard drinks   Drug use: No    Current Outpatient Medications:    albuterol (VENTOLIN HFA) 108 (90 Base) MCG/ACT inhaler, , Disp: , Rfl:    amLODipine (NORVASC) 5 MG tablet, TAKE 1 TABLET BY MOUTH  DAILY, Disp: 90 tablet, Rfl: 3   Ascorbic Acid (VITAMIN C) 1000 MG tablet, Take 1,000 mg by mouth daily., Disp: , Rfl:    aspirin EC 81 MG tablet, Take 81 mg by mouth daily., Disp: , Rfl:    benzonatate (TESSALON) 200 MG capsule, Take 1 capsule (200 mg total) by mouth 3 (three) times daily as needed for cough., Disp: 30 capsule, Rfl: 0   calcium carbonate 100 mg/ml SUSP, Take by mouth., Disp: , Rfl:    cephALEXin (KEFLEX) 500 MG capsule, Take 1 capsule (500 mg total) by mouth 3 (three) times daily. For 7 days, Disp: 21 capsule, Rfl: 0   Cholecalciferol (VITAMIN D3) 10 MCG (400 UNIT) CAPS, Take by mouth., Disp: , Rfl:    co-enzyme Q-10 30 MG capsule, Take 30 mg by mouth 3 (three) times daily., Disp: , Rfl:    FERROUS SULFATE PO, Take 1 tablet by mouth once a week., Disp: , Rfl:    fluticasone (FLONASE) 50 MCG/ACT nasal spray, USE 2 SPRAYS IN EACH NOSTRIL ONCE DAILY FOR UP TO 4 TO 6 WEEKS, THEN AS NEEDED FOR ALLERGIES, Disp: 16 g, Rfl: 5   loratadine (CLARITIN) 10 MG tablet, Take 1 tablet (10 mg total) by mouth daily. For up to 1 month, then as needed  for seasonal allergies., Disp: 30 tablet, Rfl: 3   losartan-hydrochlorothiazide (HYZAAR) 100-25 MG tablet, TAKE 1 TABLET BY MOUTH DAILY, Disp: 90 tablet, Rfl: 3   Lutein-Zeaxanthin 15-0.7 MG CAPS, Take 1 capsule by mouth daily., Disp: , Rfl:    metoprolol tartrate (LOPRESSOR) 25 MG tablet, TAKE 1 TABLET BY MOUTH  TWICE DAILY, Disp: 180 tablet, Rfl: 1   Multiple Vitamin (MULTIVITAMIN) tablet, Take 1 tablet by mouth daily., Disp: , Rfl:    nitrofurantoin, macrocrystal-monohydrate, (MACROBID) 100 MG capsule, Take 1 capsule (100 mg total) by mouth 2 (two) times  daily., Disp: 10 capsule, Rfl: 0   Omega-3 Fatty Acids (FISH OIL PO), Take 1 capsule by mouth daily., Disp: , Rfl:    pravastatin (PRAVACHOL) 20 MG tablet, TAKE 1 TABLET BY MOUTH  DAILY, Disp: 90 tablet, Rfl: 3   predniSONE (DELTASONE) 10 MG tablet, Take 6 tabs on day 1, 5 tabs on day 2, 4 tabs on day 3, 3 tabs on day 4, 2 tabs on day 5, 1 tab on day 6, Disp: 21 tablet, Rfl: 0   Saccharomyces boulardii (PROBIOTIC) 250 MG CAPS, Take 250 mg by mouth daily., Disp: , Rfl:    zoledronic acid (RECLAST) 5 MG/100ML SOLN injection, Inject 5 mg into the vein once., Disp: , Rfl:   Depression screen [facility] 2/9 07/27/2021 06/14/2021 05/24/2021  Decreased Interest 0 0 0  Down, Depressed, Hopeless 0 0 0  PHQ - 2 Score 0 0 0  Altered sleeping 0 - 0  Tired, decreased energy 1 - 1  Change in appetite 0 - 0  Feeling bad or failure about yourself  0 - 0  Trouble concentrating 0 - 0  Moving slowly or fidgety/restless 0 - 0  Suicidal thoughts 0 - 0  PHQ-9 Score 1 - 1  Difficult doing work/chores Not difficult at all - Not difficult at all    GAD 7 : Generalized Anxiety Score 07/27/2021 05/24/2021 01/27/2021  Nervous, Anxious, on Edge 0 0 0  Control/stop worrying 1 0 0  Worry too much - different things 1 1 0  Trouble relaxing 0 0 0  Restless 0 0 0  Easily annoyed or irritable 0 0 0  Afraid - awful might happen 1 0 0  Total GAD 7 Score 3 1 0  Anxiety Difficulty Not difficult at all Not difficult at all Not difficult at all    -------------------------------------------------------------------------- O: No physical exam performed due to remote telephone encounter.  Lab results reviewed.  Recent Results (from the past 2160 hour(s))  POCT Urinalysis Dipstick     Status: Abnormal   Collection Time: 11/07/21 11:03 AM  Result Value Ref Range   Color, UA Yellow    Clarity, UA Cloudy    Glucose, UA Negative Negative   Bilirubin, UA Negative    Ketones, UA Negative    Spec Grav, UA 1.015 1.010 - 1.025   Blood,  UA Large +++    pH, UA 7.0 5.0 - 8.0   Protein, UA Positive (A) Negative   Urobilinogen, UA 0.2 0.2 or 1.0 E.U./dL   Nitrite, UA Negative    Leukocytes, UA Large (3+) (A) Negative   Appearance     Odor      -------------------------------------------------------------------------- A&P:  Problem List Items Addressed This Visit   None Visit Diagnoses     Acute cystitis with hematuria    -  Primary   Relevant Medications   cephALEXin (KEFLEX) 500 MG capsule   Dysuria  Relevant Orders   POCT Urinalysis Dipstick (Completed)   Urine Culture      Clinically consistent with UTI and confirmed on UA. No recent UTIs or abx courses.  Last UTI 07/2021 see above HPI No concern for pyelo today (no systemic symptoms, neg fever, back pain, n/v).  Plan: UA consistent with UTI 2. Ordered Urine culture 3. Keflex 533m TID x 7 days 4. Improve PO hydration 5. RTC if no improvement 1-2 weeks, red flags given to return sooner   Meds ordered this encounter  Medications   cephALEXin (KEFLEX) 500 MG capsule    Sig: Take 1 capsule (500 mg total) by mouth 3 (three) times daily. For 7 days    Dispense:  21 capsule    Refill:  0   Orders Placed This Encounter  Procedures   Urine Culture   POCT Urinalysis Dipstick     Follow-up: - Return as needed  Patient verbalizes understanding with the above medical recommendations including the limitation of remote medical advice.  Specific follow-up and call-back criteria were given for patient to follow-up or seek medical care more urgently if needed.   - Time spent in direct consultation with patient on phone: 7 minutes   ANobie Putnam DCastle RockGroup 11/07/2021, 2:35 PM

## 2021-11-08 ENCOUNTER — Telehealth: Payer: Medicare Other | Admitting: Family Medicine

## 2021-11-09 DIAGNOSIS — E042 Nontoxic multinodular goiter: Secondary | ICD-10-CM | POA: Diagnosis not present

## 2021-11-09 LAB — URINE CULTURE
MICRO NUMBER:: 13032602
SPECIMEN QUALITY:: ADEQUATE

## 2021-11-10 DIAGNOSIS — Z872 Personal history of diseases of the skin and subcutaneous tissue: Secondary | ICD-10-CM | POA: Diagnosis not present

## 2021-11-10 DIAGNOSIS — I781 Nevus, non-neoplastic: Secondary | ICD-10-CM | POA: Diagnosis not present

## 2021-11-10 DIAGNOSIS — Z85828 Personal history of other malignant neoplasm of skin: Secondary | ICD-10-CM | POA: Diagnosis not present

## 2021-11-10 DIAGNOSIS — Z853 Personal history of malignant neoplasm of breast: Secondary | ICD-10-CM | POA: Diagnosis not present

## 2021-11-10 DIAGNOSIS — L578 Other skin changes due to chronic exposure to nonionizing radiation: Secondary | ICD-10-CM | POA: Diagnosis not present

## 2021-11-10 DIAGNOSIS — Z859 Personal history of malignant neoplasm, unspecified: Secondary | ICD-10-CM | POA: Diagnosis not present

## 2021-11-10 DIAGNOSIS — M810 Age-related osteoporosis without current pathological fracture: Secondary | ICD-10-CM | POA: Diagnosis not present

## 2021-11-10 NOTE — Progress Notes (Signed)
Attempted to call pt, left a message to return my call.

## 2021-11-12 DIAGNOSIS — E042 Nontoxic multinodular goiter: Secondary | ICD-10-CM | POA: Diagnosis not present

## 2021-11-23 DIAGNOSIS — M810 Age-related osteoporosis without current pathological fracture: Secondary | ICD-10-CM | POA: Diagnosis not present

## 2021-11-28 ENCOUNTER — Encounter: Payer: Self-pay | Admitting: Internal Medicine

## 2021-11-28 ENCOUNTER — Ambulatory Visit (INDEPENDENT_AMBULATORY_CARE_PROVIDER_SITE_OTHER): Payer: Medicare Other | Admitting: Internal Medicine

## 2021-11-28 ENCOUNTER — Ambulatory Visit: Payer: Self-pay

## 2021-11-28 DIAGNOSIS — N3000 Acute cystitis without hematuria: Secondary | ICD-10-CM | POA: Diagnosis not present

## 2021-11-28 DIAGNOSIS — N3001 Acute cystitis with hematuria: Secondary | ICD-10-CM | POA: Diagnosis not present

## 2021-11-28 LAB — POCT URINALYSIS DIPSTICK
Bilirubin, UA: NEGATIVE
Glucose, UA: NEGATIVE
Ketones, UA: NEGATIVE
Nitrite, UA: NEGATIVE
Protein, UA: NEGATIVE
Spec Grav, UA: <=1.005 — AB (ref 1.010–1.025)
Urobilinogen, UA: 0.2 E.U./dL
pH, UA: 7 (ref 5.0–8.0)

## 2021-11-28 MED ORDER — SULFAMETHOXAZOLE-TRIMETHOPRIM 400-80 MG PO TABS: 1.0000 | ORAL_TABLET | Freq: Two times a day (BID) | ORAL | 0 refills | Status: DC

## 2021-11-28 NOTE — Telephone Encounter (Signed)
Summary: [place] UTI advice - burning, frequency urinating  ? Pt is calling with Frequency urinating, pressure, and burinng for one day. No available appts. Pt would like to come to the office to drop off a sample. Please advise   ?  ? ?Chief Complaint: Urinary frequency, burning ?Symptoms: Above ?Frequency: Started yesterday ?Pertinent Negatives: Patient denies blood in urine ?Disposition: '[]'$ ED /'[]'$ Urgent Care (no appt availability in office) / '[]'$ Appointment(In office/virtual)/ '[]'$  [place] [facility]/ '[]'$ Home Care/ '[]'$ Refused Recommended Disposition /'[]'$ [place] [facility]/ '[]'$  Follow-up with PCP ?Additional Notes: Requesting to be worked in today. Please advise pt.  ?Answer Assessment - Initial Assessment Questions ?1. SYMPTOM: "What's the main symptom you're concerned about?" (e.g., frequency, incontinence) ?    Burning, frequency ?2. ONSET: "When did the  symptoms  start?" ?    Yesterday ?3. PAIN: "Is there any pain?" If Yes, ask: "How bad is it?" (Scale: 1-10; mild, moderate, severe) ?    8 ?4. CAUSE: "What do you think is causing the symptoms?" ?    UTI ?5. OTHER SYMPTOMS: "Do you have any other symptoms?" (e.g., fever, flank pain, blood in urine, pain with urination) ?    No ?6. PREGNANCY: "Is there any chance you are pregnant?" "When was your last menstrual period?" ?    No ? ?Protocols used: Urinary Symptoms-A-AH ? ?

## 2021-11-28 NOTE — Patient Instructions (Signed)

## 2021-11-28 NOTE — Progress Notes (Signed)
Virtual Visit via Telephone Note  I connected with Rebecca Mitchell on 11/28/21 at  4:00 PM EDT by telephone and verified that I am speaking with the correct person using two identifiers.  Location: Patient: Home Provider: Office  Person's participating in this Telephone Call: Webb Silversmith NP and Mindi Curling   I discussed the limitations, risks, security and privacy concerns of performing an evaluation and management service by telephone and the availability of in person appointments. I also discussed with the patient that there may be a patient responsible charge related to this service. The patient expressed understanding and agreed to proceed.   History of Present Illness:  Pt reports cloudy urine, urinary frequency, hesitancy, dysuria and low back pain. This started yesterday.  She denies urgency, blood in her urine, fever, chills, nausea or vomiting.  She was seen 2/20 for the same, urine grew E. coli, treated with Keflex.   Past Medical History:  Diagnosis Date   Anemia    Breast cancer ([place]) 09/2014   left breast lumpectomy with rad tx   Hyperlipidemia    Malignant neoplasm of lower-inner quadrant of left female breast ([place]) 07/2014   5 mm,T1a, N0; ER/ PR positive, HER-2/neu negative   Osteoporosis    Personal history of radiation therapy 2016   left breast ca    Current Outpatient Medications  Medication Sig Dispense Refill   albuterol (VENTOLIN HFA) 108 (90 Base) MCG/ACT inhaler      amLODipine (NORVASC) 5 MG tablet TAKE 1 TABLET BY MOUTH  DAILY 90 tablet 3   Ascorbic Acid (VITAMIN C) 1000 MG tablet Take 1,000 mg by mouth daily.     aspirin EC 81 MG tablet Take 81 mg by mouth daily.     benzonatate (TESSALON) 200 MG capsule Take 1 capsule (200 mg total) by mouth 3 (three) times daily as needed for cough. 30 capsule 0   calcium carbonate 100 mg/ml SUSP Take by mouth.     cephALEXin (KEFLEX) 500 MG capsule Take 1 capsule (500 mg total) by mouth 3 (three) times daily. For 7  days 21 capsule 0   Cholecalciferol (VITAMIN D3) 10 MCG (400 UNIT) CAPS Take by mouth.     co-enzyme Q-10 30 MG capsule Take 30 mg by mouth 3 (three) times daily.     FERROUS SULFATE PO Take 1 tablet by mouth once a week.     fluticasone (FLONASE) 50 MCG/ACT nasal spray USE 2 SPRAYS IN EACH NOSTRIL ONCE DAILY FOR UP TO 4 TO 6 WEEKS, THEN AS NEEDED FOR ALLERGIES 16 g 5   loratadine (CLARITIN) 10 MG tablet Take 1 tablet (10 mg total) by mouth daily. For up to 1 month, then as needed for seasonal allergies. 30 tablet 3   losartan-hydrochlorothiazide (HYZAAR) 100-25 MG tablet TAKE 1 TABLET BY MOUTH DAILY 90 tablet 3   Lutein-Zeaxanthin 15-0.7 MG CAPS Take 1 capsule by mouth daily.     metoprolol tartrate (LOPRESSOR) 25 MG tablet TAKE 1 TABLET BY MOUTH  TWICE DAILY 180 tablet 1   Multiple Vitamin (MULTIVITAMIN) tablet Take 1 tablet by mouth daily.     nitrofurantoin, macrocrystal-monohydrate, (MACROBID) 100 MG capsule Take 1 capsule (100 mg total) by mouth 2 (two) times daily. 10 capsule 0   Omega-3 Fatty Acids (FISH OIL PO) Take 1 capsule by mouth daily.     pravastatin (PRAVACHOL) 20 MG tablet TAKE 1 TABLET BY MOUTH  DAILY 90 tablet 3   predniSONE (DELTASONE) 10 MG tablet Take 6  tabs on day 1, 5 tabs on day 2, 4 tabs on day 3, 3 tabs on day 4, 2 tabs on day 5, 1 tab on day 6 21 tablet 0   Saccharomyces boulardii (PROBIOTIC) 250 MG CAPS Take 250 mg by mouth daily.     zoledronic acid (RECLAST) 5 MG/100ML SOLN injection Inject 5 mg into the vein once.     No current facility-administered medications for this visit.    No Known Allergies  Family History  Problem Relation Age of Onset   Breast cancer Sister 73   Heart disease Mother    Heart disease Father     Social History   Socioeconomic History   Marital status: Married    Spouse name: Not on file   Number of children: Not on file   Years of education: Not on file   Highest education level: Bachelor's degree (e.g., BA, AB, BS)   Occupational History   Not on file  Tobacco Use   Smoking status: Never   Smokeless tobacco: Never  Vaping Use   Vaping Use: Never used  Substance and Sexual Activity   Alcohol use: No    Alcohol/week: 0.0 standard drinks   Drug use: No   Sexual activity: Not on file  Other Topics Concern   Not on file  Social History Narrative   Not on file   Social Determinants of Health   Financial Resource Strain: Low Risk    Difficulty of Paying Living Expenses: Not hard at all  Food Insecurity: No Food Insecurity   Worried About Charity fundraiser in the Last Year: Never true   Ran Out of Food in the Last Year: Never true  Transportation Needs: No Transportation Needs   Lack of Transportation (Medical): No   Lack of Transportation (Non-Medical): No  Physical Activity: Sufficiently Active   Days of Exercise per Week: 2 days   Minutes of Exercise per Session: 90 min  Stress: No Stress Concern Present   Feeling of Stress : Not at all  Social Connections: Not on file  Intimate Partner Violence: Not on file     Constitutional: Denies fever, malaise, fatigue, headache or abrupt weight changes.  Respiratory: Denies difficulty breathing, shortness of breath, cough or sputum production.   Cardiovascular: Denies chest pain, chest tightness, palpitations or swelling in the hands or feet.  Gastrointestinal: Denies abdominal pain, bloating, constipation, diarrhea or blood in the stool.  GU: Patient reports urinary frequency, dysuria and cloudy urine.  Denies urgency, burning sensation, blood in urine, odor or discharge. Musculoskeletal: Patient reports low back pain.  Denies decrease in range of motion, difficulty with gait, or joint swelling.  Skin: Denies redness, rashes, lesions or ulcercations.    No other specific complaints in a complete review of systems (except as listed in HPI above).    Observations/Objective:  Wt Readings from Last 3 Encounters:  11/07/21 143 lb (64.9 kg)   07/27/21 143 lb 6.4 oz (65 kg)  07/11/21 140 lb (63.5 kg)    General: In NAD. Pulmonary/Chest: Normal effort. No respiratory distress.  Neurological: Alert and oriented.   BMET    Component Value Date/Time   NA 140 05/17/2021 0757   NA 142 02/08/2016 0820   K 3.8 05/17/2021 0757   K 4.1 09/21/2014 1028   CL 104 05/17/2021 0757   CO2 28 05/17/2021 0757   GLUCOSE 97 05/17/2021 0757   BUN 21 05/17/2021 0757   BUN 22 02/08/2016 0820   CREATININE  0.72 05/17/2021 0757   CALCIUM 9.9 05/17/2021 0757   GFRNONAA 85 05/10/2020 0830   GFRAA 98 05/10/2020 0830    Lipid Panel     Component Value Date/Time   CHOL 157 05/17/2021 0757   CHOL 165 02/08/2016 0820   TRIG 82 05/17/2021 0757   HDL 68 05/17/2021 0757   HDL 79 02/08/2016 0820   CHOLHDL 2.3 05/17/2021 0757   VLDL 12 12/20/2016 0001   LDLCALC 73 05/17/2021 0757    CBC    Component Value Date/Time   WBC 2.7 (L) 05/17/2021 0757   RBC 3.11 (L) 05/17/2021 0757   HGB 11.2 (L) 05/17/2021 0757   HGB 12.5 02/08/2016 0820   HCT 34.6 (L) 05/17/2021 0757   HCT 36.4 02/08/2016 0820   PLT 195 05/17/2021 0757   PLT 188 02/08/2016 0820   MCV 111.3 (H) 05/17/2021 0757   MCV 92 02/08/2016 0820   MCH 36.0 (H) 05/17/2021 0757   MCHC 32.4 05/17/2021 0757   RDW 12.5 05/17/2021 0757   RDW 13.4 02/08/2016 0820   LYMPHSABS 969 05/17/2021 0757   LYMPHSABS 1.3 02/08/2016 0820   MONOABS 504 12/20/2016 0001   EOSABS 119 05/17/2021 0757   EOSABS 0.2 02/08/2016 0820   BASOSABS 30 05/17/2021 0757   BASOSABS 0.1 02/08/2016 0820    Hgb A1C Lab Results  Component Value Date   HGBA1C 5.5 05/17/2021        Assessment and Plan:  UTI:  Urinalysis: 2+ blood, 2+ leuks  We will send urine culture Push fluids Rx for Septra 400-80 mg twice daily x7 days  Return precautions discussed Follow Up Instructions:    I discussed the assessment and treatment plan with the patient. The patient was provided an opportunity to ask questions  and all were answered. The patient agreed with the plan and demonstrated an understanding of the instructions.   The patient was advised to call back or seek an in-person evaluation if the symptoms worsen or if the condition fails to improve as anticipated.  I provided 5:10 minutes of non-face-to-face time during this encounter.   Webb Silversmith, NP

## 2021-11-29 ENCOUNTER — Ambulatory Visit: Payer: Self-pay

## 2021-11-29 ENCOUNTER — Other Ambulatory Visit: Payer: Self-pay | Admitting: Family Medicine

## 2021-11-29 DIAGNOSIS — I10 Essential (primary) hypertension: Secondary | ICD-10-CM

## 2021-11-29 NOTE — Telephone Encounter (Signed)
Requested Prescriptions  ?Pending Prescriptions Disp Refills  ?? metoprolol tartrate (LOPRESSOR) 25 MG tablet [Pharmacy Med Name: Metoprolol Tartrate 25 MG Oral Tablet] 180 tablet 1  ?  Sig: TAKE 1 TABLET BY MOUTH  TWICE DAILY  ?  ? Cardiovascular:  Beta Blockers Passed - 11/29/2021  5:27 AM  ?  ?  Passed - Last BP in normal range  ?  BP Readings from Last 1 Encounters:  ?07/27/21 120/67  ?   ?  ?  Passed - Last Heart Rate in normal range  ?  Pulse Readings from Last 1 Encounters:  ?07/27/21 73  ?   ?  ?  Passed - Valid encounter within last 6 months  ?  Recent Outpatient Visits   ?      ? Yesterday Acute cystitis without hematuria  ? [facility] [place] [place], NP  ? 3 weeks ago Acute cystitis with hematuria  ? [place], DO  ? 1 month ago Viral URI with cough  ? [place], NP  ? 4 months ago Dysuria  ? [facility] Garc[place], Coralie Keens, NP  ? 6 months ago Annual physical exam  ? [place], DO  ?  ?  ?Future Appointments   ?        ? In 6 months Karamalegos, Devonne Doughty, DO [facility], [place]  ? In 6 months  [facility], [place]  ?  ? ?  ?  ?  ? ?

## 2021-11-29 NOTE — Telephone Encounter (Signed)
?  Chief Complaint: Medication reaction ?Symptoms: shaking, shoulder pain, arm numbness ?Frequency: last night ?Pertinent Negatives: Patient denies chest pain ,sob, fever ?Disposition: '[]'$ ED /'[]'$ Urgent Care (no appt availability in office) / '[]'$ Appointment(In office/virtual)/ '[]'$  [place] [facility]/ '[]'$ Home Care/ '[]'$ Refused Recommended Disposition /'[]'$ [place] Mobile [facility]/ '[x]'$  Follow-up with PCP ?Additional Notes: Pt started new antibiotic after OV yesterday. Pt started with above s/s soon after taking medication. Pt would like a new medication prescribed. Pt read medication information and states that these side effects are possible with this medication. ? ? ?Reason for Disposition ? [1] Caller has URGENT medicine question about med that PCP or specialist prescribed AND [2] triager unable to answer question ? ?Answer Assessment - Initial Assessment Questions ?1. NAME of MEDICATION: "What medicine are you calling about?" ?    Septra ?2. QUESTION: "What is your question?" (e.g., double dose of medicine, side effect) ?    Pt thinks it is causing side effects - Shoulder pain, numbness, tingling, body shaking. ?3. PRESCRIBING HCP: "Who prescribed it?" Reason: if prescribed by specialist, call should be referred to that group. ?    Ms. Bailty ?4. SYMPTOMS: "Do you have any symptoms?" ?    Shoulder pain, numbness, tingling, body shaking. ? ?5. SEVERITY: If symptoms are present, ask "Are they mild, moderate or severe?" ?    moderate ?6. PREGNANCY:  "Is there any chance that you are pregnant?" "When was your last menstrual period?" ?    Na ? ?Answer Assessment - Initial Assessment Questions ?1. ONSET: "When did the pain start?" ?    Last night ?2. LOCATION: "Where is the pain located?" ?    Both shoulders and arms ?3. PAIN: "How bad is the pain?" (Scale 1-10; or mild, moderate, severe) ?  - MILD (1-3): doesn't interfere with normal activities ?  - MODERATE (4-7): interferes with normal activities (e.g., work or school) or  awakens from sleep ?  - SEVERE (8-10): excruciating pain, unable to do any normal activities, unable to move arm at all due to pain ?    moderate ?4. WORK OR EXERCISE: "Has there been any recent work or exercise that involved this part of the body?" ?    no ?5. CAUSE: "What do you think is causing the shoulder pain?" ?    Medication side effect ?6. OTHER SYMPTOMS: "Do you have any other symptoms?" (e.g., neck pain, swelling, rash, fever, numbness, weakness) ?    Numbness in both hands ?7. PREGNANCY: "Is there any chance you are pregnant?" "When was your last menstrual period?" ?    na ? ?Protocols used: Shoulder Pain-A-AH, Medication Question Call-A-AH ? ?

## 2021-11-29 NOTE — Telephone Encounter (Signed)
Those are not typical symptoms of a medication reaction to the medication that I gave her.  It is likely just coincidental.  I would recommend she continue with medication and see how it goes.  If her symptoms persist we can change the medication however the most likely common reaction to this medication is going to be a rash. ?

## 2021-11-29 NOTE — Telephone Encounter (Signed)
Pt advised.  She states she is going to give it one more night and see if she tolerates it.  ? ?Thanks,  ? ?-Mickel Baas  ?

## 2021-11-30 LAB — URINE CULTURE
MICRO NUMBER:: 13123662
SPECIMEN QUALITY:: ADEQUATE

## 2021-12-05 DIAGNOSIS — E042 Nontoxic multinodular goiter: Secondary | ICD-10-CM | POA: Diagnosis not present

## 2021-12-13 DIAGNOSIS — K642 Third degree hemorrhoids: Secondary | ICD-10-CM | POA: Diagnosis not present

## 2021-12-27 DIAGNOSIS — E042 Nontoxic multinodular goiter: Secondary | ICD-10-CM | POA: Diagnosis not present

## 2021-12-27 DIAGNOSIS — Z79899 Other long term (current) drug therapy: Secondary | ICD-10-CM | POA: Diagnosis not present

## 2021-12-27 DIAGNOSIS — Z7982 Long term (current) use of aspirin: Secondary | ICD-10-CM | POA: Diagnosis not present

## 2021-12-28 DIAGNOSIS — M810 Age-related osteoporosis without current pathological fracture: Secondary | ICD-10-CM | POA: Diagnosis not present

## 2022-02-24 DIAGNOSIS — J449 Chronic obstructive pulmonary disease, unspecified: Secondary | ICD-10-CM | POA: Diagnosis not present

## 2022-02-24 DIAGNOSIS — Z79899 Other long term (current) drug therapy: Secondary | ICD-10-CM | POA: Diagnosis not present

## 2022-02-24 DIAGNOSIS — D497 Neoplasm of unspecified behavior of endocrine glands and other parts of nervous system: Secondary | ICD-10-CM | POA: Diagnosis not present

## 2022-02-24 DIAGNOSIS — E042 Nontoxic multinodular goiter: Secondary | ICD-10-CM | POA: Diagnosis not present

## 2022-02-24 DIAGNOSIS — I10 Essential (primary) hypertension: Secondary | ICD-10-CM | POA: Diagnosis not present

## 2022-02-24 DIAGNOSIS — E049 Nontoxic goiter, unspecified: Secondary | ICD-10-CM | POA: Diagnosis not present

## 2022-02-24 DIAGNOSIS — C73 Malignant neoplasm of thyroid gland: Secondary | ICD-10-CM | POA: Diagnosis not present

## 2022-03-07 DIAGNOSIS — E890 Postprocedural hypothyroidism: Secondary | ICD-10-CM | POA: Diagnosis not present

## 2022-03-07 DIAGNOSIS — Z09 Encounter for follow-up examination after completed treatment for conditions other than malignant neoplasm: Secondary | ICD-10-CM | POA: Diagnosis not present

## 2022-03-08 ENCOUNTER — Other Ambulatory Visit: Payer: Self-pay | Admitting: Family Medicine

## 2022-03-08 DIAGNOSIS — I10 Essential (primary) hypertension: Secondary | ICD-10-CM

## 2022-03-08 DIAGNOSIS — E785 Hyperlipidemia, unspecified: Secondary | ICD-10-CM

## 2022-03-08 NOTE — Telephone Encounter (Signed)
Requested Prescriptions  Pending Prescriptions Disp Refills  . amLODipine (NORVASC) 5 MG tablet [Pharmacy Med Name: amLODIPine Besylate 5 MG Oral Tablet] 100 tablet 2    Sig: TAKE 1 TABLET BY MOUTH  DAILY     Cardiovascular: Calcium Channel Blockers 2 Failed - 03/08/2022  6:32 AM      Failed - Valid encounter within last 6 months    Recent Outpatient Visits          3 months ago Acute cystitis without hematuria   [facility] [place], Coralie Keens, NP   4 months ago Acute cystitis with hematuria   [place], DO   5 months ago Viral URI with cough   [facility] [place], Coralie Keens, NP   7 months ago [facility] [place], Coralie Keens, NP   9 months ago Annual physical exam   [facility] [place], Devonne Doughty, DO      Future Appointments            In 2 months [place], Devonne Doughty, DO [facility], [place]   In 3 months  [facility], [place] BP in normal range    BP Readings from Last 1 Encounters:  07/27/21 120/67         Passed - Last Heart Rate in normal range    Pulse Readings from Last 1 Encounters:  07/27/21 73         . pravastatin (PRAVACHOL) 20 MG tablet [Pharmacy Med Name: Pravastatin Sodium 20 MG Oral Tablet] 100 tablet 2    Sig: TAKE 1 TABLET BY MOUTH  DAILY     Cardiovascular:  Antilipid - Statins Failed - 03/08/2022  6:32 AM      Failed - Lipid Panel in normal range within the last 12 months    Cholesterol, Total  Date Value Ref Range Status  02/08/2016 165 100 - 199 mg/dL Final   Cholesterol  Date Value Ref Range Status  05/17/2021 157 <200 mg/dL Final   LDL Cholesterol (Calc)  Date Value Ref Range Status  05/17/2021 73 mg/dL (calc) Final    Comment:    Reference range: <100 . Desirable range <100 mg/dL for primary prevention;   <70 mg/dL for patients with CHD or diabetic patients  with > or =  2 CHD risk factors. Marland Kitchen LDL-C is now calculated using the Martin-Hopkins  calculation, which is a validated novel method providing  better accuracy than the Friedewald equation in the  estimation of LDL-C.  Cresenciano Genre et al. Annamaria Helling. 2500;370(48): 2061-2068  (http://education.[employer].com/faq/FAQ164)    HDL  Date Value Ref Range Status  05/17/2021 68 > OR = 50 mg/dL Final  02/08/2016 79 >39 mg/dL Final   Triglycerides  Date Value Ref Range Status  05/17/2021 82 <150 mg/dL Final         Passed - Patient is not pregnant      Passed - Valid encounter within last 12 months    Recent Outpatient Visits          3 months ago Acute cystitis without hematuria   [place], NP   4 months ago Acute cystitis with hematuria   [place], DO   5 months ago Viral URI with cough   [place],  Coralie Keens, NP   7 months ago [facility] [place], Coralie Keens, NP   9 months ago Annual physical exam   [facility] [place], DO      Future Appointments            In 2 months [place], Devonne Doughty, DO [facility], [place]   In 3 months  [facility], [place]           . metoprolol tartrate (LOPRESSOR) 25 MG tablet [Pharmacy Med Name: Metoprolol Tartrate 25 MG Oral Tablet] 200 tablet 2    Sig: TAKE 1 TABLET BY MOUTH TWICE  DAILY     Cardiovascular:  Beta Blockers Failed - 03/08/2022  6:32 AM      Failed - Valid encounter within last 6 months    Recent Outpatient Visits          3 months ago Acute cystitis without hematuria   [facility] [place], Coralie Keens, NP   4 months ago Acute cystitis with hematuria   [place], DO   5 months ago Viral URI with cough   [facility], Coralie Keens, NP   7 months ago [facility], Coralie Keens, NP   9 months ago Annual physical exam   [facility] [place], Devonne Doughty, DO      Future Appointments            In 2 months [place], Devonne Doughty, [facility], [place]   In 3 months  [facility], [place] BP in normal range    BP Readings from Last 1 Encounters:  07/27/21 120/67         Passed - Last Heart Rate in normal range    Pulse Readings from Last 1 Encounters:  07/27/21 73

## 2022-04-24 ENCOUNTER — Ambulatory Visit: Payer: Self-pay

## 2022-04-24 ENCOUNTER — Encounter: Payer: Self-pay | Admitting: Family Medicine

## 2022-04-24 ENCOUNTER — Ambulatory Visit (INDEPENDENT_AMBULATORY_CARE_PROVIDER_SITE_OTHER): Payer: Medicare Other | Admitting: Family Medicine

## 2022-04-24 VITALS — BP 125/61 | HR 65 | Ht 63.0 in | Wt 135.2 lb

## 2022-04-24 DIAGNOSIS — M41124 Adolescent idiopathic scoliosis, thoracic region: Secondary | ICD-10-CM | POA: Diagnosis not present

## 2022-04-24 DIAGNOSIS — M5442 Lumbago with sciatica, left side: Secondary | ICD-10-CM | POA: Diagnosis not present

## 2022-04-24 MED ORDER — GABAPENTIN 100 MG PO CAPS: ORAL_CAPSULE | ORAL | 0 refills | Status: DC

## 2022-04-24 MED ORDER — PREDNISONE 20 MG PO TABS
ORAL_TABLET | ORAL | 0 refills | Status: DC
Start: 2022-04-24 — End: 2022-05-08

## 2022-04-24 NOTE — Patient Instructions (Addendum)
Thank you for coming to the office today.  Start Prednisone taper over 1 week  Start Gabapentin '100mg'$  capsules, take at night for 2-3 nights only, and then increase to 2 times a day for a few days, and then may increase to 3 times a day, it may make you drowsy, if helps significantly at night only, then you can increase instead to 3 capsules at night, instead of 3 times a day - In the future if needed, we can significantly increase the dose if tolerated well, some common doses are '300mg'$  three times a day up to '600mg'$  three times a day, usually it takes several weeks or months to get to higher doses  Recommend to start taking Tylenol Extra Strength '500mg'$  tabs - take 1 to 2 tabs per dose (max '1000mg'$ ) every 6-8 hours for pain (take regularly, don't skip a dose for next 7 days), max 24 hour daily dose is 6 tablets or '3000mg'$ . In the future you can repeat the same everyday Tylenol course for 1-2 weeks at a time.   We can refer to Dr Harlow Mares or his office if not improved in 2-4 weeks  Please schedule a Follow-up Appointment to: Return if symptoms worsen or fail to improve.  If you have any other questions or concerns, please feel free to call the office or send a message through [place]. You may also schedule an earlier appointment if necessary.  Additionally, you may be receiving a survey about your experience at our office within a few days to 1 week by e-mail or mail. We value your feedback.  Nobie Putnam, DO [place]

## 2022-04-24 NOTE — Telephone Encounter (Signed)
    Chief Complaint: Left leg pain with walking. Symptoms: Above Frequency: 2 weeks ago Pertinent Negatives: Patient denies swelling Disposition: '[]'$ ED /'[]'$ Urgent Care (no appt availability in office) / '[x]'$ Appointment(In office/virtual)/ '[]'$  [place] [facility]/ '[]'$ Home Care/ '[]'$ Refused Recommended Disposition /'[]'$ [facility]/ '[]'$  Follow-up with PCP Additional Notes:   Reason for Disposition  [1] MODERATE pain (e.g., interferes with normal activities, limping) AND [2] present > 3 days  Answer Assessment - Initial Assessment Questions 1. ONSET: "When did the pain start?"      2 weeks ago 2. LOCATION: "Where is the pain located?"      Left leg - hip to ankle 3. PAIN: "How bad is the pain?"    (Scale 1-10; or mild, moderate, severe)   -  MILD (1-3): doesn't interfere with normal activities    -  MODERATE (4-7): interferes with normal activities (e.g., work or school) or awakens from sleep, limping    -  SEVERE (8-10): excruciating pain, unable to do any normal activities, unable to walk     Moderate to severe 4. WORK OR EXERCISE: "Has there been any recent work or exercise that involved this part of the body?"      No 5. CAUSE: "What do you think is causing the leg pain?"     Unsure - old fracture 6. OTHER SYMPTOMS: "Do you have any other symptoms?" (e.g., chest pain, back pain, breathing difficulty, swelling, rash, fever, numbness, weakness)     Hurts to walk 7. PREGNANCY: "Is there any chance you are pregnant?" "When was your last menstrual period?"     No  Protocols used: Leg Pain-A-AH

## 2022-04-24 NOTE — Progress Notes (Signed)
Subjective:    Patient ID: Rebecca Mitchell, female    DOB: 07/13/1941, 81 y.o.   MRN: [id number]  Rebecca Mitchell is a 81 y.o. female presenting on 04/24/2022 for Leg Pain   HPI  Left Leg Pain Reports 2 weeks ago with severe left hip and leg pain, radiating into lower leg, seemed to sudden occur and she stayed off of her legs for a period of time, and it did eventually improve and then it returned again this weekend. She said currently cannot put weight on left leg due to pain and she can only ambulate with the cane. No pain at rest. She takes Tylenol Ext str and ibuprofen PRN Admits tingling numbness paresthesia in Left leg. If prolonged sitting She denies any burning pain.  Future [facility] Dr Harlow Mares if not improving.      07/27/2021    8:41 AM 06/14/2021    2:04 PM 05/24/2021    8:59 AM  Depression screen PHQ 2/9  Decreased Interest 0 0 0  Down, Depressed, Hopeless 0 0 0  PHQ - 2 Score 0 0 0  Altered sleeping 0  0  Tired, decreased energy 1  1  Change in appetite 0  0  Feeling bad or failure about yourself  0  0  Trouble concentrating 0  0  Moving slowly or fidgety/restless 0  0  Suicidal thoughts 0  0  PHQ-9 Score 1  1  Difficult doing work/chores Not difficult at all  Not difficult at all    Social History   Tobacco Use   Smoking status: Never   Smokeless tobacco: Never  Vaping Use   Vaping Use: Never used  Substance Use Topics   Alcohol use: No    Alcohol/week: 0.0 standard drinks of alcohol   Drug use: No    Review of Systems Per HPI unless specifically indicated above     Objective:    BP 125/61   Pulse 65   Ht '5\' 3"'$  (1.6 m)   Wt 135 lb 3.2 oz (61.3 kg)   SpO2 99%   BMI 23.95 kg/m   Wt Readings from Last 3 Encounters:  04/24/22 135 lb 3.2 oz (61.3 kg)  11/07/21 143 lb (64.9 kg)  07/27/21 143 lb 6.4 oz (65 kg)    Physical Exam Vitals and nursing note reviewed.  Constitutional:      General: She is not in acute distress.    Appearance: Normal  appearance. She is well-developed. She is not diaphoretic.     Comments: Well-appearing, comfortable, cooperative  HENT:     Head: Normocephalic and atraumatic.  Eyes:     General:        Right eye: No discharge.        Left eye: No discharge.     Conjunctiva/sclera: Conjunctivae normal.  Cardiovascular:     Rate and Rhythm: Normal rate.  Pulmonary:     Effort: Pulmonary effort is normal.  Musculoskeletal:     Comments: Left hip flexion and extension full intact ROM, rotation int / ext intact as well.  She has difficulty with weight bearing on Left side due to acute pain, using cane and assistance for ambulation  Skin:    General: Skin is warm and dry.     Findings: No erythema or rash.  Neurological:     Mental Status: She is alert and oriented to person, place, and time.  Psychiatric:        Mood and Affect:  Mood normal.        Behavior: Behavior normal.        Thought Content: Thought content normal.     Comments: Well groomed, good eye contact, normal speech and thoughts    Results for orders placed or performed in visit on 11/28/21  Urine Culture   Specimen: Urine  Result Value Ref Range   MICRO NUMBER: [id number]    SPECIMEN QUALITY: Adequate    Sample Source URINE, CLEAN CATCH    STATUS: FINAL    ISOLATE 1: Escherichia coli (A)       Susceptibility   Escherichia coli - URINE CULTURE, REFLEX    AMOX/CLAVULANIC <=2 Sensitive     AMPICILLIN <=2 Sensitive     AMPICILLIN/SULBACTAM <=2 Sensitive     CEFAZOLIN* <=4 Not Reportable      * For infections other than uncomplicated UTI caused by E. coli, K. pneumoniae or P. mirabilis: Cefazolin is resistant if MIC > or = 8 mcg/mL. (Distinguishing susceptible versus intermediate for isolates with MIC < or = 4 mcg/mL requires additional testing.) For uncomplicated UTI caused by E. coli, K. pneumoniae or P. mirabilis: Cefazolin is susceptible if MIC <32 mcg/mL and predicts susceptible to the oral agents cefaclor,  cefdinir, cefpodoxime, cefprozil, cefuroxime, cephalexin and loracarbef.     CEFTAZIDIME <=1 Sensitive     CEFEPIME <=1 Sensitive     CEFTRIAXONE <=1 Sensitive     CIPROFLOXACIN <=0.25 Sensitive     LEVOFLOXACIN <=0.12 Sensitive     GENTAMICIN <=1 Sensitive     IMIPENEM <=0.25 Sensitive     NITROFURANTOIN <=16 Sensitive     PIP/TAZO <=4 Sensitive     TOBRAMYCIN <=1 Sensitive     TRIMETH/SULFA* <=20 Sensitive      * For infections other than uncomplicated UTI caused by E. coli, K. pneumoniae or P. mirabilis: Cefazolin is resistant if MIC > or = 8 mcg/mL. (Distinguishing susceptible versus intermediate for isolates with MIC < or = 4 mcg/mL requires additional testing.) For uncomplicated UTI caused by E. coli, K. pneumoniae or P. mirabilis: Cefazolin is susceptible if MIC <32 mcg/mL and predicts susceptible to the oral agents cefaclor, cefdinir, cefpodoxime, cefprozil, cefuroxime, cephalexin and loracarbef. Legend: S = Susceptible  I = Intermediate R = Resistant  NS = Not susceptible * = Not tested  NR = Not reported **NN = See antimicrobic comments   POCT urinalysis dipstick  Result Value Ref Range   Color, UA     Clarity, UA     Glucose, UA Negative Negative   Bilirubin, UA Negative    Ketones, UA Negative    Spec Grav, UA <=1.005 (A) 1.010 - 1.025   Blood, UA Moderate    pH, UA 7.0 5.0 - 8.0   Protein, UA Negative Negative   Urobilinogen, UA 0.2 0.2 or 1.0 E.U./dL   Nitrite, UA Negative    Leukocytes, UA Moderate (2+) (A) Negative   Appearance     Odor        Assessment & Plan:   Problem List Items Addressed This Visit     Scoliosis   Relevant Medications   predniSONE (DELTASONE) 20 MG tablet   gabapentin (NEURONTIN) 100 MG capsule   Other Visit Diagnoses     Acute left-sided low back pain with left-sided sciatica    -  Primary   Relevant Medications   predniSONE (DELTASONE) 20 MG tablet   gabapentin (NEURONTIN) 100 MG capsule       Acute on  chronic L LBP with associated L sciatica. Suspect likely due to muscle spasm/strain, without known injury or trauma.  In setting of known chronic LBP with DJD, with scoliosis advanced - No red flag symptoms. Negative SLR for radiculopathy - Inadequate conservative therapy   Plan: 1. Rx Prednisone taper and Gabapentin course for daily use dose titration increase 2. May use Tylenol PRN for breakthrough 3. Encouraged use of heating pad 1-2x daily for now then PRN  Follow-up 4-6 weeks if not improved for re-evaluation, consider X-ray imaging, trial of PT, and possibly referral to Orthopedic return to [facility] Dr Harlow Mares    Meds ordered this encounter  Medications   predniSONE (DELTASONE) 20 MG tablet    Sig: Take daily with food. Start with '60mg'$  (3 pills) x 2 days, then reduce to '40mg'$  (2 pills) x 2 days, then '20mg'$  (1 pill) x 3 days    Dispense:  13 tablet    Refill:  0   gabapentin (NEURONTIN) 100 MG capsule    Sig: Start 1 capsule daily at bedtime, increase by 1 cap every 2-3 days as tolerated up to 3 times a day, or may take 3 at once in evening.    Dispense:  90 capsule    Refill:  0      Follow up plan: Return if symptoms worsen or fail to improve.  Nobie Putnam, [facility] 04/24/2022, 4:12 PM

## 2022-05-08 ENCOUNTER — Telehealth: Payer: Self-pay

## 2022-05-08 DIAGNOSIS — M79605 Pain in left leg: Secondary | ICD-10-CM

## 2022-05-08 DIAGNOSIS — M41124 Adolescent idiopathic scoliosis, thoracic region: Secondary | ICD-10-CM

## 2022-05-08 DIAGNOSIS — M5442 Lumbago with sciatica, left side: Secondary | ICD-10-CM

## 2022-05-08 NOTE — Telephone Encounter (Signed)
Called patient. She has actually improved but not resolved. Continues gabapentin. Does not want to try Tramadol as option offered today. She is okay to see orthopedic, new referral to [facility] Dr Harlow Mares, has seen previously for other problem.  Nobie Putnam, [facility] 05/08/2022, 6:06 PM

## 2022-05-08 NOTE — Telephone Encounter (Signed)
Copied from [place] [phone]. Topic: General - Other >> May 08, 2022 10:26 AM Ludger Nutting wrote: Patient called to let Dr. Raliegh Ip know that her leg pain has not got any better since her visit on 8/7 and wants to know if she needs to continue taking the gabapentin (NEURONTIN) 100 MG capsule. Please advise patient.

## 2022-05-11 DIAGNOSIS — Z85828 Personal history of other malignant neoplasm of skin: Secondary | ICD-10-CM | POA: Diagnosis not present

## 2022-05-11 DIAGNOSIS — Z859 Personal history of malignant neoplasm, unspecified: Secondary | ICD-10-CM | POA: Diagnosis not present

## 2022-05-11 DIAGNOSIS — L578 Other skin changes due to chronic exposure to nonionizing radiation: Secondary | ICD-10-CM | POA: Diagnosis not present

## 2022-05-11 DIAGNOSIS — L570 Actinic keratosis: Secondary | ICD-10-CM | POA: Diagnosis not present

## 2022-05-11 DIAGNOSIS — Z872 Personal history of diseases of the skin and subcutaneous tissue: Secondary | ICD-10-CM | POA: Diagnosis not present

## 2022-05-11 DIAGNOSIS — L821 Other seborrheic keratosis: Secondary | ICD-10-CM | POA: Diagnosis not present

## 2022-05-19 DIAGNOSIS — M545 Low back pain, unspecified: Secondary | ICD-10-CM | POA: Diagnosis not present

## 2022-05-19 DIAGNOSIS — M5416 Radiculopathy, lumbar region: Secondary | ICD-10-CM | POA: Diagnosis not present

## 2022-05-23 ENCOUNTER — Other Ambulatory Visit: Payer: Self-pay

## 2022-05-23 DIAGNOSIS — I10 Essential (primary) hypertension: Secondary | ICD-10-CM

## 2022-05-23 DIAGNOSIS — R7303 Prediabetes: Secondary | ICD-10-CM

## 2022-05-23 DIAGNOSIS — E782 Mixed hyperlipidemia: Secondary | ICD-10-CM

## 2022-05-23 DIAGNOSIS — Z0000 Encounter for general adult medical examination without abnormal findings: Secondary | ICD-10-CM

## 2022-05-24 ENCOUNTER — Other Ambulatory Visit: Payer: Medicare Other

## 2022-05-24 DIAGNOSIS — I10 Essential (primary) hypertension: Secondary | ICD-10-CM | POA: Diagnosis not present

## 2022-05-24 DIAGNOSIS — R7303 Prediabetes: Secondary | ICD-10-CM | POA: Diagnosis not present

## 2022-05-24 DIAGNOSIS — E782 Mixed hyperlipidemia: Secondary | ICD-10-CM | POA: Diagnosis not present

## 2022-05-24 DIAGNOSIS — Z0000 Encounter for general adult medical examination without abnormal findings: Secondary | ICD-10-CM | POA: Diagnosis not present

## 2022-05-25 LAB — CBC WITH DIFFERENTIAL/PLATELET
Absolute Monocytes: 426 cells/uL (ref 200–950)
Basophils Absolute: 19 cells/uL (ref 0–200)
Basophils Relative: 0.6 %
Eosinophils Absolute: 192 cells/uL (ref 15–500)
Eosinophils Relative: 6 %
HCT: 30.7 % — ABNORMAL LOW (ref 35.0–45.0)
Hemoglobin: 10.3 g/dL — ABNORMAL LOW (ref 11.7–15.5)
Lymphs Abs: 701 cells/uL — ABNORMAL LOW (ref 850–3900)
MCH: 35.8 pg — ABNORMAL HIGH (ref 27.0–33.0)
MCHC: 33.6 g/dL (ref 32.0–36.0)
MCV: 106.6 fL — ABNORMAL HIGH (ref 80.0–100.0)
MPV: 11.1 fL (ref 7.5–12.5)
Monocytes Relative: 13.3 %
Neutro Abs: 1862 cells/uL (ref 1500–7800)
Neutrophils Relative %: 58.2 %
Platelets: 177 10*3/uL (ref 140–400)
RBC: 2.88 10*6/uL — ABNORMAL LOW (ref 3.80–5.10)
RDW: 12.5 % (ref 11.0–15.0)
Total Lymphocyte: 21.9 %
WBC: 3.2 10*3/uL — ABNORMAL LOW (ref 3.8–10.8)

## 2022-05-25 LAB — LIPID PANEL
Cholesterol: 150 mg/dL (ref <200)
HDL: 66 mg/dL (ref >=50)
LDL Cholesterol (Calc): 70 mg/dL (calc)
Non-HDL Cholesterol (Calc): 84 mg/dL (calc) (ref <130)
Total CHOL/HDL Ratio: 2.3 (calc) (ref <5.0)
Triglycerides: 59 mg/dL (ref <150)

## 2022-05-25 LAB — HEMOGLOBIN A1C
Hgb A1c MFr Bld: 5.8 % of total Hgb — ABNORMAL HIGH (ref <5.7)
Mean Plasma Glucose: 120 mg/dL
eAG (mmol/L): 6.6 mmol/L

## 2022-05-25 LAB — COMPREHENSIVE METABOLIC PANEL
AG Ratio: 2 (calc) (ref 1.0–2.5)
ALT: 11 U/L (ref 6–29)
AST: 14 U/L (ref 10–35)
Albumin: 4.1 g/dL (ref 3.6–5.1)
Alkaline phosphatase (APISO): 59 U/L (ref 37–153)
BUN: 16 mg/dL (ref 7–25)
CO2: 25 mmol/L (ref 20–32)
Calcium: 9.6 mg/dL (ref 8.6–10.4)
Chloride: 102 mmol/L (ref 98–110)
Creat: 0.65 mg/dL (ref 0.60–0.95)
Globulin: 2.1 g/dL (calc) (ref 1.9–3.7)
Glucose, Bld: 101 mg/dL — ABNORMAL HIGH (ref 65–99)
Potassium: 4.3 mmol/L (ref 3.5–5.3)
Sodium: 137 mmol/L (ref 135–146)
Total Bilirubin: 0.5 mg/dL (ref 0.2–1.2)
Total Protein: 6.2 g/dL (ref 6.1–8.1)

## 2022-05-29 ENCOUNTER — Other Ambulatory Visit: Payer: Self-pay | Admitting: Family Medicine

## 2022-05-29 ENCOUNTER — Ambulatory Visit (INDEPENDENT_AMBULATORY_CARE_PROVIDER_SITE_OTHER): Payer: Medicare Other | Admitting: Family Medicine

## 2022-05-29 ENCOUNTER — Encounter: Payer: Self-pay | Admitting: Family Medicine

## 2022-05-29 VITALS — BP 119/60 | HR 63 | Ht 63.0 in | Wt 142.2 lb

## 2022-05-29 DIAGNOSIS — M810 Age-related osteoporosis without current pathological fracture: Secondary | ICD-10-CM | POA: Diagnosis not present

## 2022-05-29 DIAGNOSIS — Z0000 Encounter for general adult medical examination without abnormal findings: Secondary | ICD-10-CM

## 2022-05-29 DIAGNOSIS — I10 Essential (primary) hypertension: Secondary | ICD-10-CM | POA: Diagnosis not present

## 2022-05-29 DIAGNOSIS — D509 Iron deficiency anemia, unspecified: Secondary | ICD-10-CM | POA: Diagnosis not present

## 2022-05-29 DIAGNOSIS — M41124 Adolescent idiopathic scoliosis, thoracic region: Secondary | ICD-10-CM | POA: Diagnosis not present

## 2022-05-29 DIAGNOSIS — E782 Mixed hyperlipidemia: Secondary | ICD-10-CM

## 2022-05-29 DIAGNOSIS — Z23 Encounter for immunization: Secondary | ICD-10-CM

## 2022-05-29 DIAGNOSIS — R7303 Prediabetes: Secondary | ICD-10-CM

## 2022-05-29 MED ORDER — METOPROLOL TARTRATE 25 MG PO TABS: 25.0000 mg | ORAL_TABLET | Freq: Two times a day (BID) | ORAL | 3 refills | Status: DC

## 2022-05-29 MED ORDER — PRAVASTATIN SODIUM 20 MG PO TABS: 20.0000 mg | ORAL_TABLET | Freq: Every day | ORAL | 3 refills | Status: DC

## 2022-05-29 MED ORDER — LOSARTAN POTASSIUM-HCTZ 100-25 MG PO TABS: 1.0000 | ORAL_TABLET | Freq: Every day | ORAL | 3 refills | Status: DC

## 2022-05-29 MED ORDER — AMLODIPINE BESYLATE 5 MG PO TABS: 5.0000 mg | ORAL_TABLET | Freq: Every day | ORAL | 3 refills | Status: DC

## 2022-05-29 NOTE — Assessment & Plan Note (Signed)
Controlled cholesterol on statin and lifestyle Last lipid panel 05/2022  Plan: 1. Continue current meds - pravastatin '20mg'$  daily 2. Encourage improved lifestyle - low carb/cholesterol, reduce portion size, continue improving regular exercise

## 2022-05-29 NOTE — Patient Instructions (Addendum)
Thank you for coming to the office today.  Recent Labs    05/24/22 0815  HGBA1C 5.8*   Goal to limit excess carbs starches and sugars. But overall it is similar range for the A1c for the past 4 years, 5.5 to 5.9, very mild early pre diabetes, and sometimes even below that range.  Slightly increased anemia. Iron levels slightly reduced. I would suggest taking the Iron Supplement back to 3 times a week. Make sure to take with Vitamin C.  DUE for FASTING BLOOD WORK (no food or drink after midnight before the lab appointment, only water or coffee without cream/sugar on the morning of)  SCHEDULE "Lab Only" visit in the morning at the clinic for lab draw in 1 YEAR  - Make sure Lab Only appointment is at about 1 week before your next appointment, so that results will be available  For Lab Results, once available within 2-3 days of blood draw, you can can log in to MyChart online to view your results and a brief explanation. Also, we can discuss results at next follow-up visit.   Please schedule a Follow-up Appointment to: Return in about 1 year (around 05/30/2023) for 1 year fasting lab only then 1 week later Annual Physical.  If you have any other questions or concerns, please feel free to call the office or send a message through [place]. You may also schedule an earlier appointment if necessary.  Additionally, you may be receiving a survey about your experience at our office within a few days to 1 week by e-mail or mail. We value your feedback.  Nobie Putnam, DO [place]

## 2022-05-29 NOTE — Assessment & Plan Note (Signed)
Mild elevated A1c at 5.8 Attributed to diet No other significant risk factors  Plan:  1. Not on any therapy currently  2. Encourage improved lifestyle - reviewed basics of low carb, low sugar diet, continue improving regular exercise

## 2022-05-29 NOTE — Assessment & Plan Note (Signed)
Well-controlled HTN - Home BP readings normal  No known complications   Plan:  1. Continue current BP regimen Losartan-HCTZ 100-'25mg'$  daily, Metoprolol '25mg'$  BID, Amlodipine '5mg'$  daily 2. Encourage improved lifestyle - low sodium diet, regular exercise 3.  Continue monitor BP outside office, bring readings to next visit, if persistently >140/90 or new symptoms notify office sooner

## 2022-05-29 NOTE — Progress Notes (Unsigned)
Subjective:    Patient ID: Rebecca Mitchell, female    DOB: 11-14-1940, 81 y.o.   MRN: [id number]  Rebecca Mitchell is a 81 y.o. female presenting on 05/29/2022 for Annual Exam   HPI  Here for Annual Physical and lab Review.   Elevated A1c Elevated A1c to 5.8 on last lab, 5.5 to 5.9 range previously. CBGs: Not checking, not indicated Meds: Never on meds Currently on ARB Lifestyle: - Diet (Improving her balanced diet less sweets and reduce baked goods, not adding as much sugar to sweet tea, more water now - she is still working on this) - Exercise (less regular exercise now) Denies hypoglycemia  HYPERLIPIDEMIA: - Reports no concerns. Last lipid panel 05/2022, controlled LDL 70 - Currently taking Pravastatin 12m daily, tolerating well without side effects or myalgias   CHRONIC HTN: Reports no new concerns. Checks BP at home normal range Current Meds - Amlodipine 5105mdaily, Losartan-HCTZ 100-2553maily, Metoprolol 43m42mD Reports good compliance, took meds today. Tolerating well, w/o complaints.   Chronic Scoliosis / Chronic Back Pain - Reports chronic problem with scoliosis since adolescent. Has been re-established with Dr Max [facility] without pain, muscle spasm or soreness.  She has returned to [facility] see Dr CaroCaprice Redd overall improved with her back and leg pain sciatica. She is no longer required to use her cane. May do more Physical Therapy upcoming.   Anemia On iron 1 x week, doing well, prior problem. Last Hgb mild reduced Hgb 11 to 10.3 She missed some doses of iron supplement. Denies any fatigue or weakness or symptoms   History of Breast Cancer Mammogram surveillance per [facility] Previously on Letrozole   Severe [facility] Solu[facility]riel Carinan prolia, followed with DEXA Monitoring   Dsypnea - likely secondary to Scoliosis [facility]  had PFT breathing test, she has plenty of oxygen but still gets short of breath at times, she says likely due to her scoliosis, and will follow with them. She can stop and rest for a minute and keep active. Followed by Dr CallClayborn Bignessardiac work up, evaluation of dyspnea, thought to be related to scoliosis   Reduced Hearing, Bilateral Has hearing aid bilateral   Health Maintenance:    UTD COVID19 vaccine  Due Prevnar 20 today   Dermatology Dr BeniAubery Lappingoved basal cell carcinoma off nose prior year     05/29/2022    9:02 AM 04/24/2022    4:18 PM 07/27/2021    8:41 AM  Depression screen PHQ 2/9  Decreased Interest 0 0 0  Down, Depressed, Hopeless 0 0 0  PHQ - 2 Score 0 0 0  Altered sleeping 0 0 0  Tired, decreased energy '1 1 1  ' Change in appetite 0 0 0  Feeling bad or failure about yourself  0 0 0  Trouble concentrating 0 0 0  Moving slowly or fidgety/restless 0 0 0  Suicidal thoughts 0 0 0  PHQ-9 Score '1 1 1  ' Difficult doing work/chores Not difficult at all Not difficult at all Not difficult at all    Past Medical History:  Diagnosis Date  . Anemia   . Breast cancer (HCC)[place]/2016   left breast lumpectomy with rad tx  . Hyperlipidemia   . Malignant neoplasm of lower-inner quadrant of left female breast (HCC)[place]/2015   5 mm,T1a, N0; ER/ PR positive, HER-2/neu negative  .  Osteoporosis   . Personal history of radiation therapy 2016   left breast ca   Past Surgical History:  Procedure Laterality Date  . BREAST BIOPSY Left 08/2014   invasive mammary carcinoma  . BREAST LUMPECTOMY Left 10/01/2014   invasive mammary carcinoma and DCIS with clear margins  . BREAST SURGERY Left 10/01/2014   lumpectomy  . COLONOSCOPY  2010  . COLONOSCOPY WITH PROPOFOL N/A 09/21/2015   Procedure: COLONOSCOPY WITH PROPOFOL;  Surgeon: Christene Lye, MD;  Location: [facility] ENDOSCOPY;  Service: Endoscopy;  Laterality: N/A;  . UPPER GI ENDOSCOPY  2014   Social History   Socioeconomic  History  . Marital status: Married    Spouse name: Not on file  . Number of children: Not on file  . Years of education: Not on file  . Highest education level: Bachelor's degree (e.g., BA, AB, BS)  Occupational History  . Not on file  Tobacco Use  . Smoking status: Never  . Smokeless tobacco: Never  Vaping Use  . Vaping Use: Never used  Substance and Sexual Activity  . Alcohol use: No    Alcohol/week: 0.0 standard drinks of alcohol  . Drug use: No  . Sexual activity: Not on file  Other Topics Concern  . Not on file  Social History Narrative  . Not on file   Social Determinants of Health   Financial Resource Strain: Low Risk  (06/14/2021)   Overall Financial Resource Strain (CARDIA)   . Difficulty of Paying Living Expenses: Not hard at all  Food Insecurity: No Food Insecurity (06/14/2021)   Hunger Vital Sign   . Worried About Charity fundraiser in the Last Year: Never true   . Ran Out of Food in the Last Year: Never true  Transportation Needs: No Transportation Needs (06/14/2021)   PRAPARE - Transportation   . Lack of Transportation (Medical): No   . Lack of Transportation (Non-Medical): No  Physical Activity: Sufficiently Active (06/14/2021)   Exercise Vital Sign   . Days of Exercise per Week: 2 days   . Minutes of Exercise per Session: 90 min  Stress: No Stress Concern Present (06/14/2021)   [place]   . Feeling of Stress : Not at all  Social Connections: Socially Integrated (05/14/2018)   Social Connection and Isolation Panel [NHANES]   . Frequency of Communication with Friends and Family: More than three times a week   . Frequency of Social Gatherings with Friends and Family: More than three times a week   . Attends Religious Services: More than 4 times per year   . Active Member of Clubs or Organizations: Yes   . Attends Archivist Meetings: More than 4 times per year   . Marital Status:  Married  Human resources officer Violence: Not At Risk (05/14/2018)   Humiliation, Afraid, Rape, and Kick questionnaire   . Fear of Current or Ex-Partner: No   . Emotionally Abused: No   . Physically Abused: No   . Sexually Abused: No   Family History  Problem Relation Age of Onset  . Breast cancer Sister 46  . Heart disease Mother   . Heart disease Father    Current Outpatient Medications on File Prior to Visit  Medication Sig  . albuterol (VENTOLIN HFA) 108 (90 Base) MCG/ACT inhaler   . Ascorbic Acid (VITAMIN C) 1000 MG tablet Take 1,000 mg by mouth daily.  Marland Kitchen aspirin EC 81 MG tablet Take 81 mg  by mouth daily.  . calcium carbonate 100 mg/ml SUSP Take by mouth.  . Cholecalciferol (VITAMIN D3) 10 MCG (400 UNIT) CAPS Take by mouth.  . co-enzyme Q-10 30 MG capsule Take 30 mg by mouth 3 (three) times daily.  Marland Kitchen FERROUS SULFATE PO Take 1 tablet by mouth once a week.  . fluticasone (FLONASE) 50 MCG/ACT nasal spray USE 2 SPRAYS IN EACH NOSTRIL ONCE DAILY FOR UP TO 4 TO 6 WEEKS, THEN AS NEEDED FOR ALLERGIES  . gabapentin (NEURONTIN) 100 MG capsule Start 1 capsule daily at bedtime, increase by 1 cap every 2-3 days as tolerated up to 3 times a day, or may take 3 at once in evening.  . loratadine (CLARITIN) 10 MG tablet Take 1 tablet (10 mg total) by mouth daily. For up to 1 month, then as needed for seasonal allergies.  . Lutein-Zeaxanthin 15-0.7 MG CAPS Take 1 capsule by mouth daily.  . Multiple Vitamin (MULTIVITAMIN) tablet Take 1 tablet by mouth daily.  . Omega-3 Fatty Acids (FISH OIL PO) Take 1 capsule by mouth daily.  . Saccharomyces boulardii (PROBIOTIC) 250 MG CAPS Take 250 mg by mouth daily.  . zoledronic acid (RECLAST) 5 MG/100ML SOLN injection Inject 5 mg into the vein once.   No current facility-administered medications on file prior to visit.    Review of Systems  Constitutional:  Negative for activity change, appetite change, chills, diaphoresis, fatigue and fever.  HENT:  Negative for  congestion and hearing loss.   Eyes:  Negative for visual disturbance.  Respiratory:  Negative for cough, chest tightness, shortness of breath and wheezing.   Cardiovascular:  Negative for chest pain, palpitations and leg swelling.  Gastrointestinal:  Negative for abdominal pain, constipation, diarrhea, nausea and vomiting.  Genitourinary:  Negative for dysuria, frequency and hematuria.  Musculoskeletal:  Negative for arthralgias and neck pain.  Skin:  Negative for rash.  Neurological:  Negative for dizziness, weakness, light-headedness, numbness and headaches.  Hematological:  Negative for adenopathy.  Psychiatric/Behavioral:  Negative for behavioral problems, dysphoric mood and sleep disturbance.    Per HPI unless specifically indicated above      Objective:    BP 119/60   Pulse 63   Ht '5\' 3"'  (1.6 m)   Wt 142 lb 3.2 oz (64.5 kg)   SpO2 99%   BMI 25.19 kg/m   Wt Readings from Last 3 Encounters:  05/29/22 142 lb 3.2 oz (64.5 kg)  04/24/22 135 lb 3.2 oz (61.3 kg)  11/07/21 143 lb (64.9 kg)    Physical Exam Vitals and nursing note reviewed.  Constitutional:      General: She is not in acute distress.    Appearance: She is well-developed. She is not diaphoretic.     Comments: Well-appearing, comfortable, cooperative  HENT:     Head: Normocephalic and atraumatic.  Eyes:     General:        Right eye: No discharge.        Left eye: No discharge.     Conjunctiva/sclera: Conjunctivae normal.     Pupils: Pupils are equal, round, and reactive to light.  Neck:     Thyroid: No thyromegaly.  Cardiovascular:     Rate and Rhythm: Normal rate and regular rhythm.     Pulses: Normal pulses.     Heart sounds: Normal heart sounds. No murmur heard. Pulmonary:     Effort: Pulmonary effort is normal. No respiratory distress.     Breath sounds: Normal breath sounds. No wheezing  or rales.  Abdominal:     General: Bowel sounds are normal. There is no distension.     Palpations: Abdomen  is soft. There is no mass.     Tenderness: There is no abdominal tenderness.  Musculoskeletal:        General: No tenderness. Normal range of motion.     Cervical back: Normal range of motion and neck supple.     Right lower leg: No edema.     Left lower leg: No edema.     Comments: Upper / Lower Extremities: - Normal muscle tone, strength bilateral upper extremities 5/5, lower extremities 5/5  Significant scoliosis of back.  Lymphadenopathy:     Cervical: No cervical adenopathy.  Skin:    General: Skin is warm and dry.     Findings: No erythema or rash.     Comments: Varicose veins lower ext  Neurological:     Mental Status: She is alert and oriented to person, place, and time.     Comments: Distal sensation intact to light touch all extremities  Psychiatric:        Mood and Affect: Mood normal.        Behavior: Behavior normal.        Thought Content: Thought content normal.     Comments: Well groomed, good eye contact, normal speech and thoughts     Results for orders placed or performed in visit on 05/23/22  Lipid panel  Result Value Ref Range   Cholesterol 150 <200 mg/dL   HDL 66 > OR = 50 mg/dL   Triglycerides 59 <150 mg/dL   LDL Cholesterol (Calc) 70 mg/dL (calc)   Total CHOL/HDL Ratio 2.3 <5.0 (calc)   Non-HDL Cholesterol (Calc) 84 <130 mg/dL (calc)  Comprehensive Metabolic Panel (CMET)  Result Value Ref Range   Glucose, Bld 101 (H) 65 - 99 mg/dL   BUN 16 7 - 25 mg/dL   Creat 0.65 0.60 - 0.95 mg/dL   BUN/Creatinine Ratio SEE NOTE: 6 - 22 (calc)   Sodium 137 135 - 146 mmol/L   Potassium 4.3 3.5 - 5.3 mmol/L   Chloride 102 98 - 110 mmol/L   CO2 25 20 - 32 mmol/L   Calcium 9.6 8.6 - 10.4 mg/dL   Total Protein 6.2 6.1 - 8.1 g/dL   Albumin 4.1 3.6 - 5.1 g/dL   Globulin 2.1 1.9 - 3.7 g/dL (calc)   AG Ratio 2.0 1.0 - 2.5 (calc)   Total Bilirubin 0.5 0.2 - 1.2 mg/dL   Alkaline phosphatase (APISO) 59 37 - 153 U/L   AST 14 10 - 35 U/L   ALT 11 6 - 29 U/L  CBC  with Differential  Result Value Ref Range   WBC 3.2 (L) 3.8 - 10.8 Thousand/uL   RBC 2.88 (L) 3.80 - 5.10 Million/uL   Hemoglobin 10.3 (L) 11.7 - 15.5 g/dL   HCT 30.7 (L) 35.0 - 45.0 %   MCV 106.6 (H) 80.0 - 100.0 fL   MCH 35.8 (H) 27.0 - 33.0 pg   MCHC 33.6 32.0 - 36.0 g/dL   RDW 12.5 11.0 - 15.0 %   Platelets 177 140 - 400 Thousand/uL   MPV 11.1 7.5 - 12.5 fL   Neutro Abs 1,862 1,500 - 7,800 cells/uL   Lymphs Abs 701 (L) 850 - 3,900 cells/uL   Absolute Monocytes 426 200 - 950 cells/uL   Eosinophils Absolute 192 15 - 500 cells/uL   Basophils Absolute 19 0 - 200 cells/uL  Neutrophils Relative % 58.2 %   Total Lymphocyte 21.9 %   Monocytes Relative 13.3 %   Eosinophils Relative 6.0 %   Basophils Relative 0.6 %  HgB A1c  Result Value Ref Range   Hgb A1c MFr Bld 5.8 (H) <5.7 % of total Hgb   Mean Plasma Glucose 120 mg/dL   eAG (mmol/L) 6.6 mmol/L      Assessment & Plan:   Problem List Items Addressed This Visit     Absolute anemia   Essential hypertension    Well-controlled HTN - Home BP readings normal  No known complications   Plan:  1. Continue current BP regimen Losartan-HCTZ 100-34m daily, Metoprolol 276mBID, Amlodipine 40m102maily 2. Encourage improved lifestyle - low sodium diet, regular exercise 3.  Continue monitor BP outside office, bring readings to next visit, if persistently >140/90 or new symptoms notify office sooner      Relevant Medications   amLODipine (NORVASC) 5 MG tablet   losartan-hydrochlorothiazide (HYZAAR) 100-25 MG tablet   metoprolol tartrate (LOPRESSOR) 25 MG tablet   pravastatin (PRAVACHOL) 20 MG tablet   HLD (hyperlipidemia)    Controlled cholesterol on statin and lifestyle Last lipid panel 05/2022  Plan: 1. Continue current meds - pravastatin 60m48mily 2. Encourage improved lifestyle - low carb/cholesterol, reduce portion size, continue improving regular exercise      Relevant Medications   amLODipine (NORVASC) 5 MG tablet    losartan-hydrochlorothiazide (HYZAAR) 100-25 MG tablet   metoprolol tartrate (LOPRESSOR) 25 MG tablet   pravastatin (PRAVACHOL) 20 MG tablet   OP (osteoporosis)   Pre-diabetes    Mild elevated A1c at 5.8 Attributed to diet No other significant risk factors  Plan:  1. Not on any therapy currently  2. Encourage improved lifestyle - reviewed basics of low carb, low sugar diet, continue improving regular exercise      Scoliosis   Other Visit Diagnoses     Annual physical exam    -  Primary   Needs flu shot       Relevant Orders   Flu Vaccine QUAD High Dose(Fluad)   Need for pneumococcal vaccine       Relevant Orders   Pneumococcal conjugate vaccine 20-valent       Updated Health Maintenance information Reviewed recent lab results with patient Encouraged improvement to lifestyle with diet and exercise Goal of weight loss  Flu Shot and Prevnar 20 today  Scoliosis Back Pain Improved, followed by [facility]  Osteoporosis [facility]  Orders Placed This Encounter  Procedures  . Flu Vaccine QUAD High Dose(Fluad)  . Pneumococcal conjugate vaccine 20-valent      Meds ordered this encounter  Medications  . amLODipine (NORVASC) 5 MG tablet    Sig: Take 1 tablet (5 mg total) by mouth daily.    Dispense:  90 tablet    Refill:  3    Add refills. Requesting 1 year supply  . losartan-hydrochlorothiazide (HYZAAR) 100-25 MG tablet    Sig: Take 1 tablet by mouth daily.    Dispense:  90 tablet    Refill:  3    Add refills. Requesting 1 year supply  . metoprolol tartrate (LOPRESSOR) 25 MG tablet    Sig: Take 1 tablet (25 mg total) by mouth 2 (two) times daily.    Dispense:  180 tablet    Refill:  3    Add refills. Requesting 1 year supply  . pravastatin (PRAVACHOL) 20 MG tablet  Sig: Take 1 tablet (20 mg total) by mouth daily.    Dispense:  90 tablet    Refill:  3    Add refills. Requesting 1 year supply      Follow up  plan: Return in about 1 year (around 05/30/2023) for 1 year fasting lab only then 1 week later Annual Physical.  Future labs 1 year.  Nobie Putnam, [facility] 05/29/2022, 9:16 AM

## 2022-06-13 DIAGNOSIS — R911 Solitary pulmonary nodule: Secondary | ICD-10-CM | POA: Diagnosis not present

## 2022-06-13 DIAGNOSIS — R0609 Other forms of dyspnea: Secondary | ICD-10-CM | POA: Diagnosis not present

## 2022-06-13 DIAGNOSIS — M41127 Adolescent idiopathic scoliosis, lumbosacral region: Secondary | ICD-10-CM | POA: Diagnosis not present

## 2022-06-15 DIAGNOSIS — M545 Low back pain, unspecified: Secondary | ICD-10-CM | POA: Insufficient documentation

## 2022-06-15 DIAGNOSIS — M5416 Radiculopathy, lumbar region: Secondary | ICD-10-CM | POA: Insufficient documentation

## 2022-06-19 DIAGNOSIS — M25473 Effusion, unspecified ankle: Secondary | ICD-10-CM | POA: Diagnosis not present

## 2022-06-19 DIAGNOSIS — I2089 Other forms of angina pectoris: Secondary | ICD-10-CM | POA: Diagnosis not present

## 2022-06-19 DIAGNOSIS — D72819 Decreased white blood cell count, unspecified: Secondary | ICD-10-CM | POA: Diagnosis not present

## 2022-06-19 DIAGNOSIS — M419 Scoliosis, unspecified: Secondary | ICD-10-CM | POA: Diagnosis not present

## 2022-06-19 DIAGNOSIS — J984 Other disorders of lung: Secondary | ICD-10-CM | POA: Diagnosis not present

## 2022-06-19 DIAGNOSIS — I10 Essential (primary) hypertension: Secondary | ICD-10-CM | POA: Diagnosis not present

## 2022-06-19 DIAGNOSIS — E782 Mixed hyperlipidemia: Secondary | ICD-10-CM | POA: Diagnosis not present

## 2022-06-20 ENCOUNTER — Ambulatory Visit: Payer: Medicare Other

## 2022-06-21 ENCOUNTER — Ambulatory Visit: Payer: Self-pay | Admitting: *Deleted

## 2022-06-21 ENCOUNTER — Ambulatory Visit (INDEPENDENT_AMBULATORY_CARE_PROVIDER_SITE_OTHER): Payer: Medicare Other | Admitting: Family Medicine

## 2022-06-21 DIAGNOSIS — R300 Dysuria: Secondary | ICD-10-CM | POA: Diagnosis not present

## 2022-06-21 DIAGNOSIS — N3001 Acute cystitis with hematuria: Secondary | ICD-10-CM

## 2022-06-21 LAB — POCT URINALYSIS DIPSTICK
Bilirubin, UA: NEGATIVE
Glucose, UA: NEGATIVE
Ketones, UA: NEGATIVE
Nitrite, UA: NEGATIVE
Protein, UA: POSITIVE — AB
Spec Grav, UA: 1.01 (ref 1.010–1.025)
Urobilinogen, UA: 0.2 E.U./dL
pH, UA: 7.5 (ref 5.0–8.0)

## 2022-06-21 MED ORDER — CEPHALEXIN 500 MG PO CAPS: 500.0000 mg | ORAL_CAPSULE | Freq: Three times a day (TID) | ORAL | 0 refills | Status: DC

## 2022-06-21 NOTE — Progress Notes (Signed)
Virtual Visit via Telephone The purpose of this virtual visit is to provide medical care while limiting exposure to the novel coronavirus (COVID19) for both patient and office staff.  Consent was obtained for phone visit:  Yes.   Answered questions that patient had about telehealth interaction:  Yes.   I discussed the limitations, risks, security and privacy concerns of performing an evaluation and management service by telephone. I also discussed with the patient that there may be a patient responsible charge related to this service. The patient expressed understanding and agreed to proceed.  Patient Location: Home Provider Location: [facility] (Office)  Participants in virtual visit: - Patient: Rebecca Mitchell - CMA: Orinda Kenner, [place] - Provider: Dr Parks Ranger  ---------------------------------------------------------------------- Chief Complaint  Patient presents with   Urinary Tract Infection    S: Reviewed CMA documentation. I have called patient and gathered additional HPI as follows:  UTI Reports that symptoms started last night with dysuria and urgency with urination but less urine comes out, she has not noticed hematuria. Last UTI 10/2021 did not resolve w/ Keflex but in past it has, she had bactrim had some side effects Denies fevers chills nausea vomiting back pain   Past Medical History:  Diagnosis Date   Anemia    Breast cancer ([place]) 09/2014   left breast lumpectomy with rad tx   Hyperlipidemia    Malignant neoplasm of lower-inner quadrant of left female breast ([place]) 07/2014   5 mm,T1a, N0; ER/ PR positive, HER-2/neu negative   Osteoporosis    Personal history of radiation therapy 2016   left breast ca   Social History   Tobacco Use   Smoking status: Never   Smokeless tobacco: Never  Vaping Use   Vaping Use: Never used  Substance Use Topics   Alcohol use: No    Alcohol/week: 0.0 standard drinks of alcohol   Drug use: No    Current  Outpatient Medications:    albuterol (VENTOLIN HFA) 108 (90 Base) MCG/ACT inhaler, , Disp: , Rfl:    amLODipine (NORVASC) 5 MG tablet, Take 1 tablet (5 mg total) by mouth daily., Disp: 90 tablet, Rfl: 3   Ascorbic Acid (VITAMIN C) 1000 MG tablet, Take 1,000 mg by mouth daily., Disp: , Rfl:    aspirin EC 81 MG tablet, Take 81 mg by mouth daily., Disp: , Rfl:    calcium carbonate 100 mg/ml SUSP, Take by mouth., Disp: , Rfl:    Cholecalciferol (VITAMIN D3) 10 MCG (400 UNIT) CAPS, Take by mouth., Disp: , Rfl:    co-enzyme Q-10 30 MG capsule, Take 30 mg by mouth 3 (three) times daily., Disp: , Rfl:    FERROUS SULFATE PO, Take 1 tablet by mouth once a week., Disp: , Rfl:    fluticasone (FLONASE) 50 MCG/ACT nasal spray, USE 2 SPRAYS IN EACH NOSTRIL ONCE DAILY FOR UP TO 4 TO 6 WEEKS, THEN AS NEEDED FOR ALLERGIES, Disp: 16 g, Rfl: 5   gabapentin (NEURONTIN) 100 MG capsule, Start 1 capsule daily at bedtime, increase by 1 cap every 2-3 days as tolerated up to 3 times a day, or may take 3 at once in evening., Disp: 90 capsule, Rfl: 0   loratadine (CLARITIN) 10 MG tablet, Take 1 tablet (10 mg total) by mouth daily. For up to 1 month, then as needed for seasonal allergies., Disp: 30 tablet, Rfl: 3   losartan-hydrochlorothiazide (HYZAAR) 100-25 MG tablet, Take 1 tablet by mouth daily., Disp: 90 tablet, Rfl: 3  Lutein-Zeaxanthin 15-0.7 MG CAPS, Take 1 capsule by mouth daily., Disp: , Rfl:    metoprolol tartrate (LOPRESSOR) 25 MG tablet, Take 1 tablet (25 mg total) by mouth 2 (two) times daily., Disp: 180 tablet, Rfl: 3   Multiple Vitamin (MULTIVITAMIN) tablet, Take 1 tablet by mouth daily., Disp: , Rfl:    Omega-3 Fatty Acids (FISH OIL PO), Take 1 capsule by mouth daily., Disp: , Rfl:    pravastatin (PRAVACHOL) 20 MG tablet, Take 1 tablet (20 mg total) by mouth daily., Disp: 90 tablet, Rfl: 3   Saccharomyces boulardii (PROBIOTIC) 250 MG CAPS, Take 250 mg by mouth daily., Disp: , Rfl:    zoledronic acid (RECLAST)  5 MG/100ML SOLN injection, Inject 5 mg into the vein once., Disp: , Rfl:      05/29/2022    9:02 AM 04/24/2022    4:18 PM 07/27/2021    8:41 AM  Depression screen PHQ 2/9  Decreased Interest 0 0 0  Down, Depressed, Hopeless 0 0 0  PHQ - 2 Score 0 0 0  Altered sleeping 0 0 0  Tired, decreased energy '1 1 1  ' Change in appetite 0 0 0  Feeling bad or failure about yourself  0 0 0  Trouble concentrating 0 0 0  Moving slowly or fidgety/restless 0 0 0  Suicidal thoughts 0 0 0  PHQ-9 Score '1 1 1  ' Difficult doing work/chores Not difficult at all Not difficult at all Not difficult at all       05/29/2022    9:03 AM 04/24/2022    4:18 PM 07/27/2021    8:41 AM 05/24/2021    9:00 AM  GAD 7 : Generalized Anxiety Score  Nervous, Anxious, on Edge 0 0 0 0  Control/stop worrying '1 1 1 ' 0  Worry too much - different things '1 1 1 1  ' Trouble relaxing 0 0 0 0  Restless 0 0 0 0  Easily annoyed or irritable 0 0 0 0  Afraid - awful might happen '1 1 1 ' 0  Total GAD 7 Score '3 3 3 1  ' Anxiety Difficulty Not difficult at all Not difficult at all Not difficult at all Not difficult at all    -------------------------------------------------------------------------- O: No physical exam performed due to remote telephone encounter.  Lab results reviewed.  Recent Results (from the past 2160 hour(s))  Lipid panel     Status: None   Collection Time: 05/24/22  8:15 AM  Result Value Ref Range   Cholesterol 150 <200 mg/dL   HDL 66 > OR = 50 mg/dL   Triglycerides 59 <150 mg/dL   LDL Cholesterol (Calc) 70 mg/dL (calc)    Comment: Reference range: <100 . Desirable range <100 mg/dL for primary prevention;   <70 mg/dL for patients with CHD or diabetic patients  with > or = 2 CHD risk factors. Marland Kitchen LDL-C is now calculated using the Martin-Hopkins  calculation, which is a validated novel method providing  better accuracy than the Friedewald equation in the  estimation of LDL-C.  Cresenciano Genre et al. Annamaria Helling. 7373;668(15):  2061-2068  (http://education.[facility].com/faq/FAQ164)    Total CHOL/HDL Ratio 2.3 <5.0 (calc)   Non-HDL Cholesterol (Calc) 84 <130 mg/dL (calc)    Comment: For patients with diabetes plus 1 major ASCVD risk  factor, treating to a non-HDL-C goal of <100 mg/dL  (LDL-C of <70 mg/dL) is considered a therapeutic  option.   Comprehensive Metabolic Panel (CMET)     Status: Abnormal   Collection Time: 05/24/22  8:15 AM  Result Value Ref Range   Glucose, Bld 101 (H) 65 - 99 mg/dL    Comment: .            Fasting reference interval . For someone without known diabetes, a glucose value between 100 and 125 mg/dL is consistent with prediabetes and should be confirmed with a follow-up test. .    BUN 16 7 - 25 mg/dL   Creat 0.65 0.60 - 0.95 mg/dL   BUN/Creatinine Ratio SEE NOTE: 6 - 22 (calc)    Comment:    Not Reported: BUN and Creatinine are within    reference range. .    Sodium 137 135 - 146 mmol/L   Potassium 4.3 3.5 - 5.3 mmol/L   Chloride 102 98 - 110 mmol/L   CO2 25 20 - 32 mmol/L   Calcium 9.6 8.6 - 10.4 mg/dL   Total Protein 6.2 6.1 - 8.1 g/dL   Albumin 4.1 3.6 - 5.1 g/dL   Globulin 2.1 1.9 - 3.7 g/dL (calc)   AG Ratio 2.0 1.0 - 2.5 (calc)   Total Bilirubin 0.5 0.2 - 1.2 mg/dL   Alkaline phosphatase (APISO) 59 37 - 153 U/L   AST 14 10 - 35 U/L   ALT 11 6 - 29 U/L  CBC with Differential     Status: Abnormal   Collection Time: 05/24/22  8:15 AM  Result Value Ref Range   WBC 3.2 (L) 3.8 - 10.8 Thousand/uL   RBC 2.88 (L) 3.80 - 5.10 Million/uL   Hemoglobin 10.3 (L) 11.7 - 15.5 g/dL   HCT 30.7 (L) 35.0 - 45.0 %   MCV 106.6 (H) 80.0 - 100.0 fL   MCH 35.8 (H) 27.0 - 33.0 pg   MCHC 33.6 32.0 - 36.0 g/dL   RDW 12.5 11.0 - 15.0 %   Platelets 177 140 - 400 Thousand/uL   MPV 11.1 7.5 - 12.5 fL   Neutro Abs 1,862 1,500 - 7,800 cells/uL   Lymphs Abs 701 (L) 850 - 3,900 cells/uL   Absolute Monocytes 426 200 - 950 cells/uL   Eosinophils Absolute 192 15 - 500 cells/uL    Basophils Absolute 19 0 - 200 cells/uL   Neutrophils Relative % 58.2 %   Total Lymphocyte 21.9 %   Monocytes Relative 13.3 %   Eosinophils Relative 6.0 %   Basophils Relative 0.6 %  HgB A1c     Status: Abnormal   Collection Time: 05/24/22  8:15 AM  Result Value Ref Range   Hgb A1c MFr Bld 5.8 (H) <5.7 % of total Hgb    Comment: For someone without known diabetes, a hemoglobin  A1c value between 5.7% and 6.4% is consistent with prediabetes and should be confirmed with a  follow-up test. . For someone with known diabetes, a value <7% indicates that their diabetes is well controlled. A1c targets should be individualized based on duration of diabetes, age, comorbid conditions, and other considerations. . This assay result is consistent with an increased risk of diabetes. . Currently, no consensus exists regarding use of hemoglobin A1c for diagnosis of diabetes for children. .    Mean Plasma Glucose 120 mg/dL   eAG (mmol/L) 6.6 mmol/L  POCT Urinalysis Dipstick     Status: Abnormal   Collection Time: 06/21/22  3:44 PM  Result Value Ref Range   Color, UA Straw    Clarity, UA Cloudy    Glucose, UA Negative Negative   Bilirubin, UA Negative    Ketones, UA  Negative    Spec Grav, UA 1.010 1.010 - 1.025   Blood, UA Moderate ++    pH, UA 7.5 5.0 - 8.0   Protein, UA Positive (A) Negative   Urobilinogen, UA 0.2 0.2 or 1.0 E.U./dL   Nitrite, UA Negative    Leukocytes, UA Moderate (2+) (A) Negative   Appearance     Odor      -------------------------------------------------------------------------- A&P:  Problem List Items Addressed This Visit   None Visit Diagnoses     Dysuria    -  Primary   Relevant Orders   POCT Urinalysis Dipstick (Completed)      Clinically consistent with UTI and confirmed on UA. No recent UTIs or abx courses.  Last UTI 10/2021 and 11/2021- Keflex > then Bactrim with resolution did not tolerate bactrim well No concern for pyelo today (no systemic  symptoms, neg fever, back pain, n/v).  Plan: UA today 2. Ordered Urine culture 3. Keflex 583m TID x 7 days 4. Improve PO hydration 5. RTC if no improvement 1-2 weeks, red flags given to return sooner  If returns UTI would consider repeat Bactrim vs Cipro   No orders of the defined types were placed in this encounter.   Follow-up: - Return in 1 week if not improved  Patient verbalizes understanding with the above medical recommendations including the limitation of remote medical advice.  Specific follow-up and call-back criteria were given for patient to follow-up or seek medical care more urgently if needed.   - Time spent in direct consultation with patient on phone: 6 minutes   ANobie Putnam D[facility] 06/21/2022, 3:49 PM

## 2022-06-21 NOTE — Patient Instructions (Addendum)
1. You have a Urinary Tract Infection - this is very common, your symptoms are reassuring and you should get better within 1 week on the antibiotics - Start Keflex '500mg'$  3 times daily for next 7 days, complete entire course, even if feeling better - We sent urine for a culture, we will call you within next few days if we need to change antibiotics - Please drink plenty of fluids, improve hydration over next 1 week  If symptoms worsening, developing nausea / vomiting, worsening back pain, fevers / chills / sweats, then please return for re-evaluation sooner.  If you take AZO OTC - limit this to 2-3 days MAX to avoid affecting kidneys  D-Mannose is a natural supplement that can actually help bind to urinary bacteria and reduce their effectiveness it can help prevent UTI from forming, and may reduce some symptoms. It likely cannot cure an active UTI but it is worth a try and good to prevent them with. Try '500mg'$  twice a day at a full dose if you want, or check package instructions for more info   Please schedule a Follow-up Appointment to: Return in about 1 week (around 06/28/2022), or if symptoms worsen or fail to improve, for UTI if not resolved.  If you have any other questions or concerns, please feel free to call the office or send a message through [place]. You may also schedule an earlier appointment if necessary.  Additionally, you may be receiving a survey about your experience at our office within a few days to 1 week by e-mail or mail. We value your feedback.  Rebecca Putnam, DO [place]

## 2022-06-21 NOTE — Telephone Encounter (Signed)
  Chief Complaint: urinary pressure, pain/burning, cloudy urine, feels urge- but then unable to urinate Symptoms: Patient wants to come and leave specimen and get medication now Frequency: symptoms started yesterday Pertinent Negatives: Patient denies fever, blood in urine Disposition: '[]'$ ED /'[x]'$ Urgent Care (no appt availability in office) / '[]'$ Appointment(In office/virtual)/ '[]'$  [facility]/ '[]'$ Home Care/ '[]'$ Refused Recommended Disposition /'[]'$ [facility]/ '[]'$  Follow-up with PCP Additional Notes: Patient advised of appointment in am- she states he can't wait- wants to come leave specimen and get treatment now. Patient advised her provider is seeing patients- no open appointment- can go to UC if feels she can't wait. Patient states she will wait 30 minutes to see if she gets office response- and then she is going to UC.

## 2022-06-21 NOTE — Telephone Encounter (Signed)
Only thing we can do would be double book the 4pm apt. If she can drop off earlier urine sample for urinalysis and culture, then we can talk on phone and treat it after 4pm. I am not sure what time we would be free to call since it is a double book apt, probably closer to 430 or later  If that is not feasible she can go to [facility] [place], [place] 06/21/2022, 2:53 PM

## 2022-06-21 NOTE — Telephone Encounter (Signed)
Patient came in and left a sample. She is scheduled for a virtual appt today at 4.

## 2022-06-21 NOTE — Telephone Encounter (Signed)
Patient states she called earlier today to see if she could bring a urinary specimen. Call to office- Apolonio Schneiders did send it to nurse- but  Reason for Disposition  Urinating more frequently than usual (i.e., frequency)  Answer Assessment - Initial Assessment Questions 1. SYMPTOM: "What's the main symptom you're concerned about?" (e.g., frequency, incontinence)     Burning with urination, pressure 2. ONSET: "When did the  symptoms start  start?"     yesterday 3. PAIN: "Is there any pain?" If Yes, ask: "How bad is it?" (Scale: 1-10; mild, moderate, severe)     Burning with urination 4. CAUSE: "What do you think is causing the symptoms?"     UTI 5. OTHER SYMPTOMS: "Do you have any other symptoms?" (e.g., blood in urine, fever, flank pain, pain with urination)     Pressure, frequency 6. PREGNANCY: "Is there any chance you are pregnant?" "When was your last menstrual period?"  Protocols used: Urinary Symptoms-A-AH

## 2022-06-22 ENCOUNTER — Ambulatory Visit: Payer: Medicare Other | Admitting: Internal Medicine

## 2022-06-23 LAB — URINE CULTURE
MICRO NUMBER:: 14007086
SPECIMEN QUALITY:: ADEQUATE

## 2022-07-13 DIAGNOSIS — M5416 Radiculopathy, lumbar region: Secondary | ICD-10-CM | POA: Diagnosis not present

## 2022-07-13 DIAGNOSIS — M545 Low back pain, unspecified: Secondary | ICD-10-CM | POA: Diagnosis not present

## 2022-07-17 ENCOUNTER — Other Ambulatory Visit: Payer: Self-pay | Admitting: Family Medicine

## 2022-07-17 DIAGNOSIS — I10 Essential (primary) hypertension: Secondary | ICD-10-CM

## 2022-07-18 NOTE — Telephone Encounter (Signed)
Unable to refill per protocol, request is too soon. Last refill 05/29/22 for 90 and 3 RF. E-Prescribing Status: Receipt confirmed by pharmacy (05/29/2022 9:26 AM EDT). Will refuse.  Requested Prescriptions  Pending Prescriptions Disp Refills  . losartan-hydrochlorothiazide (HYZAAR) 100-25 MG tablet [Pharmacy Med Name: LOSARTAN/HCTZ 100/25MG TABLETS] 90 tablet 3    Sig: TAKE 1 TABLET BY MOUTH DAILY     Cardiovascular: ARB + Diuretic Combos Failed - 07/17/2022 10:39 AM      Failed - eGFR is 10 or above and within 180 days    GFR, Est African American  Date Value Ref Range Status  05/10/2020 98 > OR = 60 mL/min/1.86m Final   GFR, Est Non African American  Date Value Ref Range Status  05/10/2020 85 > OR = 60 mL/min/1.749mFinal   eGFR  Date Value Ref Range Status  05/17/2021 84 > OR = 60 mL/min/1.7376minal    Comment:    The eGFR is based on the CKD-EPI 2021 equation. To calculate  the new eGFR from a previous Creatinine or Cystatin C result, go to https://www.kidney.org/professionals/ kdoqi/gfr%5Fcalculator          Passed - K in normal range and within 180 days    Potassium  Date Value Ref Range Status  05/24/2022 4.3 3.5 - 5.3 mmol/L Final  09/21/2014 4.1 3.5 - 5.1 mmol/L Final         Passed - Na in normal range and within 180 days    Sodium  Date Value Ref Range Status  05/24/2022 137 135 - 146 mmol/L Final  02/08/2016 142 134 - 144 mmol/L Final         Passed - Cr in normal range and within 180 days    Creat  Date Value Ref Range Status  05/24/2022 0.65 0.60 - 0.95 mg/dL Final         Passed - Patient is not pregnant      Passed - Last BP in normal range    BP Readings from Last 1 Encounters:  05/29/22 119/60         Passed - Valid encounter within last 6 months    Recent Outpatient Visits          3 weeks ago [facility]stit[facility] with [facility]uria   [facility]   1 month ago [facility] physical [facility]   2 months ago Acute left-sided low back pain with left-sided sciatica   [facility]   7 months ago [facility]   8 months ago Acute cystitis with hematuria   [facility]      Future Appointments            [facility]

## 2022-07-20 ENCOUNTER — Telehealth: Payer: Self-pay | Admitting: Family Medicine

## 2022-07-20 DIAGNOSIS — M545 Low back pain, unspecified: Secondary | ICD-10-CM | POA: Diagnosis not present

## 2022-07-20 DIAGNOSIS — M5416 Radiculopathy, lumbar region: Secondary | ICD-10-CM | POA: Diagnosis not present

## 2022-07-20 NOTE — Telephone Encounter (Signed)
Pt stopped by the office needing to have her losartan-hydrochlorothiazide (HYZAAR) 100-25 MG.  Pharm:  [facility] in [place], [place]  (Pt stated that she no longer want to use [facility]).  Pt stated that she is out of this medication.  Pt would like to have a call once it has been sent to the pharmacy.

## 2022-07-21 ENCOUNTER — Other Ambulatory Visit: Payer: Self-pay

## 2022-07-21 DIAGNOSIS — I10 Essential (primary) hypertension: Secondary | ICD-10-CM

## 2022-07-21 MED ORDER — LOSARTAN POTASSIUM-HCTZ 100-25 MG PO TABS: 1.0000 | ORAL_TABLET | Freq: Every day | ORAL | 3 refills | Status: DC

## 2022-07-21 NOTE — Telephone Encounter (Signed)
I have refilled that and sent it to [facility] in [place].   Patient is aware.

## 2022-07-27 DIAGNOSIS — M5416 Radiculopathy, lumbar region: Secondary | ICD-10-CM | POA: Diagnosis not present

## 2022-07-27 DIAGNOSIS — M545 Low back pain, unspecified: Secondary | ICD-10-CM | POA: Diagnosis not present

## 2022-08-15 DIAGNOSIS — K642 Third degree hemorrhoids: Secondary | ICD-10-CM | POA: Diagnosis not present

## 2022-09-21 DIAGNOSIS — H43813 Vitreous degeneration, bilateral: Secondary | ICD-10-CM | POA: Diagnosis not present

## 2022-09-21 DIAGNOSIS — H353121 Nonexudative age-related macular degeneration, left eye, early dry stage: Secondary | ICD-10-CM | POA: Diagnosis not present

## 2022-09-21 DIAGNOSIS — H2513 Age-related nuclear cataract, bilateral: Secondary | ICD-10-CM | POA: Diagnosis not present

## 2022-09-21 DIAGNOSIS — H33311 Horseshoe tear of retina without detachment, right eye: Secondary | ICD-10-CM | POA: Diagnosis not present

## 2022-09-21 DIAGNOSIS — H5203 Hypermetropia, bilateral: Secondary | ICD-10-CM | POA: Diagnosis not present

## 2022-09-21 DIAGNOSIS — H524 Presbyopia: Secondary | ICD-10-CM | POA: Diagnosis not present

## 2022-09-21 DIAGNOSIS — H52223 Regular astigmatism, bilateral: Secondary | ICD-10-CM | POA: Diagnosis not present

## 2022-09-22 ENCOUNTER — Other Ambulatory Visit: Payer: Self-pay | Admitting: General Surgery

## 2022-09-22 DIAGNOSIS — Z1231 Encounter for screening mammogram for malignant neoplasm of breast: Secondary | ICD-10-CM

## 2022-10-10 DIAGNOSIS — E042 Nontoxic multinodular goiter: Secondary | ICD-10-CM | POA: Diagnosis not present

## 2022-10-10 DIAGNOSIS — M810 Age-related osteoporosis without current pathological fracture: Secondary | ICD-10-CM | POA: Diagnosis not present

## 2022-10-16 DIAGNOSIS — M810 Age-related osteoporosis without current pathological fracture: Secondary | ICD-10-CM | POA: Diagnosis not present

## 2022-10-16 DIAGNOSIS — Z9889 Other specified postprocedural states: Secondary | ICD-10-CM | POA: Diagnosis not present

## 2022-10-16 DIAGNOSIS — E041 Nontoxic single thyroid nodule: Secondary | ICD-10-CM | POA: Diagnosis not present

## 2022-11-01 DIAGNOSIS — E041 Nontoxic single thyroid nodule: Secondary | ICD-10-CM | POA: Diagnosis not present

## 2022-11-06 ENCOUNTER — Ambulatory Visit
Admission: RE | Admit: 2022-11-06 | Discharge: 2022-11-06 | Disposition: A | Discharge status: Home / Self Care | Payer: Medicare Other | Source: Ambulatory Visit | Attending: General Surgery | Admitting: General Surgery

## 2022-11-06 DIAGNOSIS — Z1231 Encounter for screening mammogram for malignant neoplasm of breast: Secondary | ICD-10-CM | POA: Diagnosis not present

## 2022-11-14 DIAGNOSIS — Z853 Personal history of malignant neoplasm of breast: Secondary | ICD-10-CM | POA: Diagnosis not present

## 2023-01-01 ENCOUNTER — Ambulatory Visit
Admission: RE | Admit: 2023-01-01 | Discharge: 2023-01-01 | Disposition: A | Discharge status: Home / Self Care | Payer: Medicare Other | Attending: Internal Medicine | Admitting: Internal Medicine

## 2023-01-01 ENCOUNTER — Ambulatory Visit (INDEPENDENT_AMBULATORY_CARE_PROVIDER_SITE_OTHER): Payer: Medicare Other | Admitting: Internal Medicine

## 2023-01-01 ENCOUNTER — Ambulatory Visit
Admission: RE | Admit: 2023-01-01 | Discharge: 2023-01-01 | Disposition: A | Discharge status: Home / Self Care | Payer: Medicare Other | Source: Ambulatory Visit | Attending: Internal Medicine | Admitting: Internal Medicine

## 2023-01-01 VITALS — BP 134/82 | HR 73 | Temp 96.8°F | Wt 142.0 lb

## 2023-01-01 DIAGNOSIS — M25572 Pain in left ankle and joints of left foot: Secondary | ICD-10-CM | POA: Diagnosis not present

## 2023-01-01 DIAGNOSIS — M25472 Effusion, left ankle: Secondary | ICD-10-CM

## 2023-01-01 DIAGNOSIS — M7989 Other specified soft tissue disorders: Secondary | ICD-10-CM | POA: Diagnosis not present

## 2023-01-01 DIAGNOSIS — J301 Allergic rhinitis due to pollen: Secondary | ICD-10-CM

## 2023-01-01 DIAGNOSIS — S9002XA Contusion of left ankle, initial encounter: Secondary | ICD-10-CM

## 2023-01-01 NOTE — Patient Instructions (Signed)
RICE Therapy for Routine Care of Injuries The routine care of many injuries includes rest, ice, compression, and elevation (RICE therapy). RICE therapy is often recommended for injuries to soft tissues, such as muscle strain, sprains, bruises, and overuse injuries. It can also be used for some bone injuries. Using RICE therapy can help to relieve pain and lessen swelling. Supplies needed: Ice. Plastic bag. Towel. Elastic bandage. Pillow or pillows to raise (elevate) the injured body part. How to care for your injury with RICE therapy  Rest Rest your injury. This may help with the healing process. Rest usually involves limiting your normal activities and not using the injured part of your body. Generally, you can return to your normal activities when your health care provider says it is okay and you can do them without much discomfort. If you rest the injury too much, it may not heal as well. Some injuries heal better with early movement instead of resting for too long. Talk with your health care provider about how you should limit your activities and whether you should start range-of-motion exercises for your injury. Ice Ice your injury to lessen swelling and pain. Do not apply ice directly to your skin. Put ice in a plastic bag. Place a towel between your skin and the bag. Leave the ice on for 20 minutes, 2-3 times a day. Use ice on as many days as told by your health care provider. Compression Put pressure (compression) on your injured area to control swelling, give support, and help with discomfort. Compression may be done with an elastic bandage. If an elastic bandage has been applied, follow these general tips: Use the bandage as directed by the maker of the bandage that you are using. Do not wrap the bandage too tightly. That may block (cut off) circulation in the arm or leg in the area below the bandage. If part of your body beyond the bandage becomes blue, numb, cold, swollen, or more  painful, your bandage is probably too tight. If this occurs, remove your bandage and reapply it more loosely. Remove and reapply the bandage every 3-4 hours or as told by your health care provider. See your health care provider if the bandage seems to be making your problems worse rather than better. Elevation Elevate your injured area to lessen swelling and pain. If possible, elevate your injured area at or above the level of your heart or the center of your chest. Contact a health care provider if: Your pain and swelling continue. Your symptoms are getting worse rather than improving. Having these problems may mean that you need further evaluation or imaging tests, such as X-rays or an MRI. Sometimes, X-rays may not show a small broken bone (fracture) until days after the injury happened. Make a follow-up appointment with your health care provider. Ask your health care provider, or the department that is doing the imaging test, when your results will be ready. Get help right away if: You have sudden severe pain at or below the area of your injury. You have redness or increased swelling around your injury. You have tingling or numbness at or below the area of your injury and it does not improve after you remove the elastic bandage. Summary The routine care of many injuries includes rest, ice, compression, and elevation (RICE therapy). Using RICE therapy can help to relieve pain and lessen swelling. RICE therapy is often recommended for injuries to soft tissues, such as muscle strain, sprains, bruises, and overuse injuries. It can also   be used for some bone injuries. Seek medical care if your pain and swelling continue or if your symptoms are getting worse rather than improving. This information is not intended to replace advice given to you by your health care provider. Make sure you discuss any questions you have with your health care provider. Document Revised: 11/10/2019 Document Reviewed:  05/25/2017 Elsevier Patient Education  2021 [employer].  

## 2023-01-01 NOTE — Progress Notes (Signed)
HPI  Pt presents to the clinic today with c/o headache, runny nose and cough. This started yesterday. She is blowing clear mucous out of her nose. The cough is productive of clear mucous. She denies fever, chills or body aches. She has tried Dayquil and cough syrup with minimal relief of symptoms. She has not had sick contacts that she is aware of. She does have seasonal allergies. She has no history of asthma, COPD and does not smoke.  She also reports left ankle pain. She reports this occurred 1 week ago. She reports she twisted her ankle after getting up from sitting for an extended period of time. She describes the pain as a dull and achy. The pain is worse with ambulation. She reports associated swelling and bruising but denies numbness or tingling. She has tried Tylenol and ice with some relief of symptoms.   Review of Systems      Past Medical History:  Diagnosis Date   Anemia    Breast cancer (HCC) 09/2014   left breast lumpectomy with rad tx   Hyperlipidemia    Malignant neoplasm of lower-inner quadrant of left female breast (HCC) 07/2014   5 mm,T1a, N0; ER/ PR positive, HER-2/neu negative   Osteoporosis    Personal history of radiation therapy 2016   left breast ca    Family History  Problem Relation Age of Onset   Breast cancer Sister 20   Heart disease Mother    Heart disease Father     Social History   Socioeconomic History   Marital status: Married    Spouse name: Not on file   Number of children: Not on file   Years of education: Not on file   Highest education level: Bachelor's degree (e.g., BA, AB, BS)  Occupational History   Not on file  Tobacco Use   Smoking status: Never   Smokeless tobacco: Never  Vaping Use   Vaping Use: Never used  Substance and Sexual Activity   Alcohol use: No    Alcohol/week: 0.0 standard drinks of alcohol   Drug use: No   Sexual activity: Not on file  Other Topics Concern   Not on file  Social History Narrative   Not on  file   Social Determinants of Health   Financial Resource Strain: Low Risk  (06/14/2021)   Overall Financial Resource Strain (CARDIA)    Difficulty of Paying Living Expenses: Not hard at all  Food Insecurity: No Food Insecurity (06/14/2021)   Hunger Vital Sign    Worried About Running Out of Food in the Last Year: Never true    Ran Out of Food in the Last Year: Never true  Transportation Needs: No Transportation Needs (06/14/2021)   PRAPARE - Administrator, Civil Service (Medical): No    Lack of Transportation (Non-Medical): No  Physical Activity: Sufficiently Active (06/14/2021)   Exercise Vital Sign    Days of Exercise per Week: 2 days    Minutes of Exercise per Session: 90 min  Stress: No Stress Concern Present (06/14/2021)   [employer] of Occupational Health - Occupational Stress Questionnaire    Feeling of Stress : Not at all  Social Connections: Socially Integrated (05/14/2018)   Social Connection and Isolation Panel [NHANES]    Frequency of Communication with Friends and Family: More than three times a week    Frequency of Social Gatherings with Friends and Family: More than three times a week    Attends Religious Services: More than  4 times per year    Active Member of Clubs or Organizations: Yes    Attends Banker Meetings: More than 4 times per year    Marital Status: Married  Catering manager Violence: Not At Risk (05/14/2018)   Humiliation, Afraid, Rape, and Kick questionnaire    Fear of Current or Ex-Partner: No    Emotionally Abused: No    Physically Abused: No    Sexually Abused: No    No Known Allergies   Constitutional: Positive headache. Denies fatigue, fever or abrupt weight changes.  HEENT:  Positive runny nose. Denies eye redness, eye pain, pressure behind the eyes, facial pain, nasal congestion, ear pain, ringing in the ears, wax buildup or sore throat. Respiratory: Positive cough. Denies difficulty breathing or shortness of  breath.  Cardiovascular: Denies chest pain, chest tightness, palpitations or swelling in the hands or feet.  MSK: Positive left ankle pain and swelling, difficulty with gait.  Denies decrease in range of motion. Skin: Positive for bruising of the left ankle and foot.  Denies redness, rash, lesion or ulceration.  No other specific complaints in a complete review of systems (except as listed in HPI above).  Objective:  BP 134/82 (BP Location: Right Arm, Patient Position: Sitting, Cuff Size: Normal)   Pulse 73   Temp (!) 96.8 F (36 C) (Temporal)   Wt 142 lb (64.4 kg)   SpO2 95%   BMI 25.15 kg/m  Her Wt Readings from Last 3 Encounters:  05/29/22 142 lb 3.2 oz (64.5 kg)  04/24/22 135 lb 3.2 oz (61.3 kg)  11/07/21 143 lb (64.9 kg)     General: Appears her stated age, well developed, well nourished in NAD. HEENT: Head: normal shape and size, no sinus tenderness noted; Eyes: sclera white, no icterus, conjunctiva pink;  Nose: mucosa pink and moist, septum midline; Throat/Mouth: + PND. Teeth present, mucosa pink and moist, no exudate noted, no lesions or ulcerations noted.  Neck: No cervical lymphadenopathy.  Cardiovascular: Normal rate and rhythm. S1,S2 noted.  No murmur, rubs or gallops noted.  Pulmonary/Chest: Normal effort and positive vesicular breath sounds with intermittent expiratory wheeze noted. No respiratory distress. No , rales or ronchi noted.  MSK: Normal flexion, extension and rotation of left ankle.  Pain with palpation of the lateral malleolus and proximal fourth and fifth metatarsals.  1+ swelling noted of the left ankle.  Limping gait. Skin: Bruising noted of the left ankle and foot.     Assessment & Plan:   Allergic Rhinitis:  Advised her to start Zyrtec 10 mg daily OTC She has a leftover Rx for Prednisone that she can take for wheezing No indication for antibiotics at this time  Left Ankle Pain and Swelling:  Likely a sprain but given her pinpoint tenderness  will obtain x-ray of the left ankle to rule out fracture Encouraged rest, ice and elevation  RTC as needed or if symptoms persist.   Nicki Reaper, NP

## 2023-01-03 ENCOUNTER — Telehealth: Payer: Self-pay

## 2023-01-03 DIAGNOSIS — M7989 Other specified soft tissue disorders: Secondary | ICD-10-CM | POA: Insufficient documentation

## 2023-01-03 DIAGNOSIS — M25472 Effusion, left ankle: Secondary | ICD-10-CM | POA: Diagnosis not present

## 2023-01-03 DIAGNOSIS — M25473 Effusion, unspecified ankle: Secondary | ICD-10-CM | POA: Insufficient documentation

## 2023-01-03 DIAGNOSIS — M25572 Pain in left ankle and joints of left foot: Secondary | ICD-10-CM | POA: Diagnosis not present

## 2023-01-03 DIAGNOSIS — M79672 Pain in left foot: Secondary | ICD-10-CM | POA: Diagnosis not present

## 2023-01-03 NOTE — Telephone Encounter (Signed)
-----   Message from Lorre Munroe, NP sent at 01/03/2023 10:29 AM EDT ----- X-ray of the right ankle does show a acute avulsion fracture at the navicular bone.  I need her to go to the [facility] walk-in clinic today for treatment of this.

## 2023-01-03 NOTE — Telephone Encounter (Signed)
Spoke with patient and she will go to [facility].

## 2023-01-08 ENCOUNTER — Ambulatory Visit: Payer: Self-pay | Admitting: *Deleted

## 2023-01-08 DIAGNOSIS — J40 Bronchitis, not specified as acute or chronic: Secondary | ICD-10-CM

## 2023-01-08 MED ORDER — AZITHROMYCIN 250 MG PO TABS
ORAL_TABLET | ORAL | 0 refills | Status: DC
Start: 2023-01-08 — End: 2023-06-04

## 2023-01-08 NOTE — Telephone Encounter (Signed)
Summary: cough   Pt said she was in the office last Monday for a cough and congestion.  The put her on prednisone and she has finished it.  She said she still has a cough.  Please advise.  [phone]     Reason for Disposition  Wheezing is present  Answer Assessment - Initial Assessment Questions 1. ONSET: "When did the cough begin?"      4/13 2. SEVERITY: "How bad is the cough today?"      No better- keeps patient up at night- prednisone didn't seem help 3. SPUTUM: "Describe the color of your sputum" (none, dry cough; clear, white, yellow, green)     Thick- clear to white 4. HEMOPTYSIS: "Are you coughing up any blood?" If so ask: "How much?" (flecks, streaks, tablespoons, etc.)     no 5. DIFFICULTY BREATHING: "Are you having difficulty breathing?" If Yes, ask: "How bad is it?" (e.g., mild, moderate, severe)    - MILD: No SOB at rest, mild SOB with walking, speaks normally in sentences, can lie down, no retractions, pulse < 100.    - MODERATE: SOB at rest, SOB with minimal exertion and prefers to sit, cannot lie down flat, speaks in phrases, mild retractions, audible wheezing, pulse 100-120.    - SEVERE: Very SOB at rest, speaks in single words, struggling to breathe, sitting hunched forward, retractions, pulse > 120      Coughing spells can cause patient to not catch breath 6. FEVER: "Do you have a fever?" If Yes, ask: "What is your temperature, how was it measured, and when did it start?"     no 7. CARDIAC HISTORY: "Do you have any history of heart disease?" (e.g., heart attack, congestive heart failure)      Hypertension  8. LUNG HISTORY: "Do you have any history of lung disease?"  (e.g., pulmonary embolus, asthma, emphysema)     No- wheezing 9. PE RISK FACTORS: "Do you have a history of blood clots?" (or: recent major surgery, recent prolonged travel, bedridden)     no 10. OTHER SYMPTOMS: "Do you have any other symptoms?" (e.g., runny nose, wheezing, chest pain)       Wheezing,  tight in throat/lungs  Protocols used: Cough - Acute Productive-A-AH

## 2023-01-08 NOTE — Addendum Note (Signed)
Addended by: Smitty Cords on: 01/08/2023 05:47 PM   Modules accepted: Orders

## 2023-01-08 NOTE — Telephone Encounter (Signed)
  Chief Complaint: cough- production Symptoms: cough spasm, wheezing Frequency: 4/13 Pertinent Negatives: Patient denies fever Disposition: ED /[] Urgent Care (no appt availability in office) / Appointment(In office/virtual)/  [facility]/ Home Care/ Refused Recommended Disposition /[]  Mobile Bus/  Follow-up with PCP Additional Notes: Call to office- [facility] request call for review - no open appointment open will need to check with provider. Patient will await call back. Patient also went to Ortho provider - Dr Derryl Harbor and they would like a copy of Xray for comparison if possible.

## 2023-01-08 NOTE — Telephone Encounter (Signed)
Called patient. She finished prednisone past week, with opposite effect, it did not improve her cough and she felt out of it. She has improved on Zpak before for cough and bronchitis. I was agreeable to ordering this and sent Zpak to pharmacy locally. She will use OTC cough meds otherwise, and follow up if not improved  Saralyn Pilar, DO [facility] 01/08/2023, 5:47 PM

## 2023-01-10 ENCOUNTER — Ambulatory Visit
Admission: RE | Admit: 2023-01-10 | Discharge: 2023-01-10 | Disposition: A | Discharge status: Home / Self Care | Payer: Medicare Other | Source: Ambulatory Visit | Attending: Family Medicine | Admitting: Family Medicine

## 2023-01-10 ENCOUNTER — Ambulatory Visit
Admission: RE | Admit: 2023-01-10 | Discharge: 2023-01-10 | Disposition: A | Discharge status: Home / Self Care | Payer: Medicare Other | Attending: Family Medicine | Admitting: Family Medicine

## 2023-01-10 ENCOUNTER — Other Ambulatory Visit: Payer: Self-pay | Admitting: Family Medicine

## 2023-01-10 DIAGNOSIS — J40 Bronchitis, not specified as acute or chronic: Secondary | ICD-10-CM

## 2023-01-10 DIAGNOSIS — R059 Cough, unspecified: Secondary | ICD-10-CM | POA: Insufficient documentation

## 2023-01-10 DIAGNOSIS — K449 Diaphragmatic hernia without obstruction or gangrene: Secondary | ICD-10-CM | POA: Diagnosis not present

## 2023-01-11 ENCOUNTER — Telehealth: Payer: Self-pay

## 2023-01-11 NOTE — Telephone Encounter (Signed)
Patient informed of results.  She is still taking the antibiotic but doing better.  Advised her to contact the office next week if any other concerns.

## 2023-01-11 NOTE — Telephone Encounter (Signed)
-----   Message from Smitty Cords, DO sent at 01/10/2023  7:27 PM EDT ----- Please notify with X-ray results.  Chest X-ray is negative for pneumonia. It does show large hiatal hernia. This is unchanged from previous.  She was already prescribed Azithromycin antibiotic. Let me know if has other questions or needs any other help.  She originally saw [place] 4/15 for other issue and had cough.   May need return visit or Urgent Care eval if not improving or notable worsening.  Saralyn Pilar, DO [facility] 01/10/2023, 7:27 PM

## 2023-01-12 ENCOUNTER — Ambulatory Visit: Payer: Medicare Other | Admitting: Family Medicine

## 2023-01-12 DIAGNOSIS — S93402A Sprain of unspecified ligament of left ankle, initial encounter: Secondary | ICD-10-CM | POA: Diagnosis not present

## 2023-01-31 DIAGNOSIS — M810 Age-related osteoporosis without current pathological fracture: Secondary | ICD-10-CM | POA: Diagnosis not present

## 2023-03-07 ENCOUNTER — Ambulatory Visit (INDEPENDENT_AMBULATORY_CARE_PROVIDER_SITE_OTHER): Payer: Medicare Other

## 2023-03-07 VITALS — Ht 63.0 in | Wt 140.0 lb

## 2023-03-07 DIAGNOSIS — Z0000 Encounter for general adult medical examination without abnormal findings: Secondary | ICD-10-CM

## 2023-03-07 NOTE — Patient Instructions (Signed)
Ms. Rebecca Mitchell , Thank you for taking time to come for your Medicare Wellness Visit. I appreciate your ongoing commitment to your health goals. Please review the following plan we discussed and let me know if I can assist you in the future.   These are the goals we discussed:  Goals      DIET - INCREASE WATER INTAKE     Recommend drinking at least 6-8 glasses of water a day      Patient Stated     06/14/2021, stay healthy     Patient Stated     03/07/2023, stay healthy        This is a list of the screening recommended for you and due dates:  Health Maintenance  Topic Date Due   DTaP/Tdap/Td vaccine (1 - Tdap) Never done   Zoster (Shingles) Vaccine (1 of 2) Never done   COVID-19 Vaccine (4 - 2023-24 season) 05/19/2022   Flu Shot  04/19/2023   Medicare Annual Wellness Visit  03/06/2024   Pneumonia Vaccine  Completed   DEXA scan (bone density measurement)  Completed   HPV Vaccine  Aged Out   Hepatitis C Screening  Discontinued    Advanced directives: Advance directive discussed with you today.   Conditions/risks identified: none  Next appointment: Follow up in one year for your annual wellness visit    Preventive Care 65 Years and Older, Female Preventive care refers to lifestyle choices and visits with your health care provider that can promote health and wellness. What does preventive care include? A yearly physical exam. This is also called an annual well check. Dental exams once or twice a year. Routine eye exams. Ask your health care provider how often you should have your eyes checked. Personal lifestyle choices, including: Daily care of your teeth and gums. Regular physical activity. Eating a healthy diet. Avoiding tobacco and drug use. Limiting alcohol use. Practicing safe sex. Taking low-dose aspirin every day. Taking vitamin and mineral supplements as recommended by your health care provider. What happens during an annual well check? The services and screenings  done by your health care provider during your annual well check will depend on your age, overall health, lifestyle risk factors, and family history of disease. Counseling  Your health care provider may ask you questions about your: Alcohol use. Tobacco use. Drug use. Emotional well-being. Home and relationship well-being. Sexual activity. Eating habits. History of falls. Memory and ability to understand (cognition). Work and work Astronomer. Reproductive health. Screening  You may have the following tests or measurements: Height, weight, and BMI. Blood pressure. Lipid and cholesterol levels. These may be checked every 5 years, or more frequently if you are over 64 years old. Skin check. Lung cancer screening. You may have this screening every year starting at age 31 if you have a 30-pack-year history of smoking and currently smoke or have quit within the past 15 years. Fecal occult blood test (FOBT) of the stool. You may have this test every year starting at age 54. Flexible sigmoidoscopy or colonoscopy. You may have a sigmoidoscopy every 5 years or a colonoscopy every 10 years starting at age 16. Hepatitis C blood test. Hepatitis B blood test. Sexually transmitted disease (STD) testing. Diabetes screening. This is done by checking your blood sugar (glucose) after you have not eaten for a while (fasting). You may have this done every 1-3 years. Bone density scan. This is done to screen for osteoporosis. You may have this done starting at age 71.  Mammogram. This may be done every 1-2 years. Talk to your health care provider about how often you should have regular mammograms. Talk with your health care provider about your test results, treatment options, and if necessary, the need for more tests. Vaccines  Your health care provider may recommend certain vaccines, such as: Influenza vaccine. This is recommended every year. Tetanus, diphtheria, and acellular pertussis (Tdap, Td)  vaccine. You may need a Td booster every 10 years. Zoster vaccine. You may need this after age 49. Pneumococcal 13-valent conjugate (PCV13) vaccine. One dose is recommended after age 76. Pneumococcal polysaccharide (PPSV23) vaccine. One dose is recommended after age 67. Talk to your health care provider about which screenings and vaccines you need and how often you need them. This information is not intended to replace advice given to you by your health care provider. Make sure you discuss any questions you have with your health care provider. Document Released: 10/01/2015 Document Revised: 05/24/2016 Document Reviewed: 07/06/2015 Elsevier Interactive Patient Education  2017 [employer] Prevention in the Home Falls can cause injuries. They can happen to people of all ages. There are many things you can do to make your home safe and to help prevent falls. What can I do on the outside of my home? Regularly fix the edges of walkways and driveways and fix any cracks. Remove anything that might make you trip as you walk through a door, such as a raised step or threshold. Trim any bushes or trees on the path to your home. Use bright outdoor lighting. Clear any walking paths of anything that might make someone trip, such as rocks or tools. Regularly check to see if handrails are loose or broken. Make sure that both sides of any steps have handrails. Any raised decks and porches should have guardrails on the edges. Have any leaves, snow, or ice cleared regularly. Use sand or salt on walking paths during winter. Clean up any spills in your garage right away. This includes oil or grease spills. What can I do in the bathroom? Use night lights. Install grab bars by the toilet and in the tub and shower. Do not use towel bars as grab bars. Use non-skid mats or decals in the tub or shower. If you need to sit down in the shower, use a plastic, non-slip stool. Keep the floor dry. Clean up any  water that spills on the floor as soon as it happens. Remove soap buildup in the tub or shower regularly. Attach bath mats securely with double-sided non-slip rug tape. Do not have throw rugs and other things on the floor that can make you trip. What can I do in the bedroom? Use night lights. Make sure that you have a light by your bed that is easy to reach. Do not use any sheets or blankets that are too big for your bed. They should not hang down onto the floor. Have a firm chair that has side arms. You can use this for support while you get dressed. Do not have throw rugs and other things on the floor that can make you trip. What can I do in the kitchen? Clean up any spills right away. Avoid walking on wet floors. Keep items that you use a lot in easy-to-reach places. If you need to reach something above you, use a strong step stool that has a grab bar. Keep electrical cords out of the way. Do not use floor polish or wax that makes floors slippery. If  you must use wax, use non-skid floor wax. Do not have throw rugs and other things on the floor that can make you trip. What can I do with my stairs? Do not leave any items on the stairs. Make sure that there are handrails on both sides of the stairs and use them. Fix handrails that are broken or loose. Make sure that handrails are as long as the stairways. Check any carpeting to make sure that it is firmly attached to the stairs. Fix any carpet that is loose or worn. Avoid having throw rugs at the top or bottom of the stairs. If you do have throw rugs, attach them to the floor with carpet tape. Make sure that you have a light switch at the top of the stairs and the bottom of the stairs. If you do not have them, ask someone to add them for you. What else can I do to help prevent falls? Wear shoes that: Do not have high heels. Have rubber bottoms. Are comfortable and fit you well. Are closed at the toe. Do not wear sandals. If you use a  stepladder: Make sure that it is fully opened. Do not climb a closed stepladder. Make sure that both sides of the stepladder are locked into place. Ask someone to hold it for you, if possible. Clearly mark and make sure that you can see: Any grab bars or handrails. First and last steps. Where the edge of each step is. Use tools that help you move around (mobility aids) if they are needed. These include: Canes. Walkers. Scooters. Crutches. Turn on the lights when you go into a dark area. Replace any light bulbs as soon as they burn out. Set up your furniture so you have a clear path. Avoid moving your furniture around. If any of your floors are uneven, fix them. If there are any pets around you, be aware of where they are. Review your medicines with your doctor. Some medicines can make you feel dizzy. This can increase your chance of falling. Ask your doctor what other things that you can do to help prevent falls. This information is not intended to replace advice given to you by your health care provider. Make sure you discuss any questions you have with your health care provider. Document Released: 07/01/2009 Document Revised: 02/10/2016 Document Reviewed: 10/09/2014 Elsevier Interactive Patient Education  2017 [employer].

## 2023-03-07 NOTE — Progress Notes (Signed)
Subjective:   KAMERA KUNDRAT is a 82 y.o. female who presents for Medicare Annual (Subsequent) preventive examination.  Visit Complete: Virtual  I connected with  ZAYDE BURGUS on 03/07/23 by a audio enabled telemedicine application and verified that I am speaking with the correct person using two identifiers.  Patient Location: Home  Provider Location: Office/Clinic  I discussed the limitations of evaluation and management by telemedicine. The patient expressed understanding and agreed to proceed.    Review of Systems     Cardiac Risk Factors include: advanced age (>62men, >24 women);dyslipidemia;hypertension     Objective:    Today's Vitals   03/07/23 1456  Weight: 140 lb (63.5 kg)  Height: 5\' 3"  (1.6 m)   Body mass index is 24.8 kg/m.     03/07/2023    3:04 PM 06/14/2021    2:02 PM 09/01/2019    1:10 PM 06/17/2019    1:09 PM 08/26/2018    1:24 PM 05/14/2018    4:33 PM 10/10/2017    6:45 AM  Advanced Directives  Does Patient Have a Medical Advance Directive? No No No No No No No  Would patient like information on creating a medical advance directive?   No - Patient declined  No - Patient declined No - Patient declined No - Patient declined    Current Medications (verified) Outpatient Encounter Medications as of 03/07/2023  Medication Sig   albuterol (VENTOLIN HFA) 108 (90 Base) MCG/ACT inhaler    amLODipine (NORVASC) 5 MG tablet Take 1 tablet (5 mg total) by mouth daily.   Ascorbic Acid (VITAMIN C) 1000 MG tablet Take 1,000 mg by mouth daily.   aspirin EC 81 MG tablet Take 81 mg by mouth daily.   calcium carbonate 100 mg/ml SUSP Take by mouth.   Cholecalciferol (VITAMIN D3) 10 MCG (400 UNIT) CAPS Take by mouth.   co-enzyme Q-10 30 MG capsule Take 30 mg by mouth 3 (three) times daily.   FERROUS SULFATE PO Take 1 tablet by mouth once a week.   fluticasone (FLONASE) 50 MCG/ACT nasal spray USE 2 SPRAYS IN EACH NOSTRIL ONCE DAILY FOR UP TO 4 TO 6 WEEKS, THEN AS NEEDED FOR  ALLERGIES   loratadine (CLARITIN) 10 MG tablet Take 1 tablet (10 mg total) by mouth daily. For up to 1 month, then as needed for seasonal allergies.   losartan-hydrochlorothiazide (HYZAAR) 100-25 MG tablet Take 1 tablet by mouth daily.   Lutein-Zeaxanthin 15-0.7 MG CAPS Take 1 capsule by mouth daily.   metoprolol tartrate (LOPRESSOR) 25 MG tablet Take 1 tablet (25 mg total) by mouth 2 (two) times daily.   Omega-3 Fatty Acids (FISH OIL PO) Take 1 capsule by mouth daily.   pravastatin (PRAVACHOL) 20 MG tablet Take 1 tablet (20 mg total) by mouth daily.   Saccharomyces boulardii (PROBIOTIC) 250 MG CAPS Take 250 mg by mouth daily.   zoledronic acid (RECLAST) 5 MG/100ML SOLN injection Inject 5 mg into the vein once.   azithromycin (ZITHROMAX Z-PAK) 250 MG tablet Take 2 tabs (500mg  total) on Day 1. Take 1 tab (250mg ) daily for next 4 days. (Patient not taking: Reported on 03/07/2023)   cephALEXin (KEFLEX) 500 MG capsule Take 1 capsule (500 mg total) by mouth 3 (three) times daily. For 7 days (Patient not taking: Reported on 03/07/2023)   gabapentin (NEURONTIN) 100 MG capsule Start 1 capsule daily at bedtime, increase by 1 cap every 2-3 days as tolerated up to 3 times a day, or may take  3 at once in evening. (Patient not taking: Reported on 03/07/2023)   Multiple Vitamin (MULTIVITAMIN) tablet Take 1 tablet by mouth daily. (Patient not taking: Reported on 03/07/2023)   No facility-administered encounter medications on file as of 03/07/2023.    Allergies (verified) Patient has no known allergies.   History: Past Medical History:  Diagnosis Date   Anemia    Breast cancer (HCC) 09/2014   left breast lumpectomy with rad tx   Hyperlipidemia    Malignant neoplasm of lower-inner quadrant of left female breast (HCC) 07/2014   5 mm,T1a, N0; ER/ PR positive, HER-2/neu negative   Osteoporosis    Personal history of radiation therapy 2016   left breast ca   Past Surgical History:  Procedure Laterality Date    BREAST BIOPSY Left 08/2014   invasive mammary carcinoma   BREAST LUMPECTOMY Left 10/01/2014   invasive mammary carcinoma and DCIS with clear margins   BREAST SURGERY Left 10/01/2014   lumpectomy   COLONOSCOPY  2010   COLONOSCOPY WITH PROPOFOL N/A 09/21/2015   Procedure: COLONOSCOPY WITH PROPOFOL;  Surgeon: Kieth Brightly, MD;  Location: [facility] ENDOSCOPY;  Service: Endoscopy;  Laterality: N/A;   UPPER GI ENDOSCOPY  2014   Family History  Problem Relation Age of Onset   Breast cancer Sister 9   Heart disease Mother    Heart disease Father    Social History   Socioeconomic History   Marital status: Married    Spouse name: Not on file   Number of children: Not on file   Years of education: Not on file   Highest education level: Bachelor's degree (e.g., BA, AB, BS)  Occupational History   Not on file  Tobacco Use   Smoking status: Never   Smokeless tobacco: Never  Vaping Use   Vaping Use: Never used  Substance and Sexual Activity   Alcohol use: No    Alcohol/week: 0.0 standard drinks of alcohol   Drug use: No   Sexual activity: Not on file  Other Topics Concern   Not on file  Social History Narrative   Not on file   Social Determinants of Health   Financial Resource Strain: Low Risk  (03/07/2023)   Overall Financial Resource Strain (CARDIA)    Difficulty of Paying Living Expenses: Not hard at all  Food Insecurity: No Food Insecurity (03/07/2023)   Hunger Vital Sign    Worried About Running Out of Food in the Last Year: Never true    Ran Out of Food in the Last Year: Never true  Transportation Needs: No Transportation Needs (03/07/2023)   PRAPARE - Administrator, Civil Service (Medical): No    Lack of Transportation (Non-Medical): No  Physical Activity: Inactive (03/07/2023)   Exercise Vital Sign    Days of Exercise per Week: 0 days    Minutes of Exercise per Session: 0 min  Stress: No Stress Concern Present (03/07/2023)   [employer] of  Occupational Health - Occupational Stress Questionnaire    Feeling of Stress : Not at all  Social Connections: Patient Declined (03/07/2023)   Social Connection and Isolation Panel [NHANES]    Frequency of Communication with Friends and Family: Patient declined    Frequency of Social Gatherings with Friends and Family: Patient declined    Attends Religious Services: Patient declined    Database administrator or Organizations: Patient declined    Attends Banker Meetings: Patient declined    Marital Status: Patient declined  Tobacco Counseling Counseling given: Not Answered   Clinical Intake:  Pre-visit preparation completed: Yes  Pain : No/denies pain     Nutritional Status: BMI of 19-24  Normal Nutritional Risks: None Diabetes: No  How often do you need to have someone help you when you read instructions, pamphlets, or other written materials from your doctor or pharmacy?: 1 - Never  Interpreter Needed?: No  Information entered by :: NAllen LPN   Activities of Daily Living    03/07/2023    2:58 PM  In your present state of health, do you have any difficulty performing the following activities:  Hearing? 1  Comment wears hearing aids  Vision? 0  Difficulty concentrating or making decisions? 0  Walking or climbing stairs? 0  Dressing or bathing? 0  Doing errands, shopping? 0  Preparing Food and eating ? N  Using the Toilet? N  In the past six months, have you accidently leaked urine? N  Do you have problems with loss of bowel control? N  Managing your Medications? N  Managing your Finances? N  Housekeeping or managing your Housekeeping? N    Patient Care Team: Smitty Cords, DO as PCP - General (Family Medicine) Carmina Miller, MD as Referring Physician (Radiation Oncology) Lemar Livings Merrily Pew, MD (General Surgery) Jesusita Oka, MD (Dermatology) Tedd Sias Marlana Salvage, MD as Physician Assistant (Endocrinology)  Indicate any recent  Medical Services you may have received from other than [facility] providers in the past year (date may be approximate).     Assessment:   This is a routine wellness examination for Janazia.  Hearing/Vision screen Vision Screening - Comments:: Regular eye exams, Dr. Larence Penning  Dietary issues and exercise activities discussed:     Goals Addressed             This Visit's Progress    Patient Stated       03/07/2023, stay healthy       Depression Screen    03/07/2023    3:05 PM 05/29/2022    9:02 AM 04/24/2022    4:18 PM 07/27/2021    8:41 AM 06/14/2021    2:04 PM 05/24/2021    8:59 AM 01/27/2021   11:29 AM  PHQ 2/9 Scores  PHQ - 2 Score 0 0 0 0 0 0 0  PHQ- 9 Score  1 1 1  1  0    Fall Risk    03/07/2023    3:04 PM 05/29/2022    9:02 AM 04/24/2022    4:18 PM 06/14/2021    2:03 PM 05/24/2021    8:59 AM  Fall Risk   Falls in the past year? 0 0 0 1 1  Comment    missed the curb   Number falls in past yr: 0 0 0 0 1  Injury with Fall? 0 0 0 1 0  Risk for fall due to : Medication side effect No Fall Risks Impaired mobility Medication side effect   Follow up Falls prevention discussed;Education provided;Falls evaluation completed Falls evaluation completed Falls evaluation completed Falls evaluation completed;Education provided;Falls prevention discussed Falls evaluation completed    MEDICARE RISK AT HOME:  Medicare Risk at Home - 03/07/23 1504     Any stairs in or around the home? Yes    If so, are there any without handrails? No    Home free of loose throw rugs in walkways, pet beds, electrical cords, etc? Yes    Adequate lighting in your home to reduce risk of falls? Yes  Life alert? No    Use of a cane, walker or w/c? No    Grab bars in the bathroom? Yes    Shower chair or bench in shower? Yes    Elevated toilet seat or a handicapped toilet? Yes             TIMED UP AND GO:  Was the test performed?  No    Cognitive Function:        03/07/2023    3:05 PM 06/14/2021     2:05 PM 05/01/2017    3:08 PM  6CIT Screen  What Year? 0 points 0 points 0 points  What month? 0 points 0 points 0 points  What time? 0 points 0 points 0 points  Count back from 20 2 points 0 points 0 points  Months in reverse 0 points 0 points 0 points  Repeat phrase 2 points 0 points 0 points  Total Score 4 points 0 points 0 points    Immunizations Immunization History  Administered Date(s) Administered   Fluad Quad(high Dose 65+) 05/16/2019, 07/06/2020, 07/11/2021, 05/29/2022   Influenza, High Dose Seasonal PF 08/15/2016, 06/13/2017, 06/20/2018   Influenza,inj,Quad PF,6+ Mos 06/14/2015   Influenza,inj,quad, With Preservative 06/19/2019   PFIZER(Purple Top)SARS-COV-2 Vaccination 10/03/2019, 10/24/2019, 07/22/2020   PNEUMOCOCCAL CONJUGATE-20 05/29/2022   Pneumococcal Polysaccharide-23 08/12/2012    TDAP status: Due, Education has been provided regarding the importance of this vaccine. Advised may receive this vaccine at local pharmacy or Health Dept. Aware to provide a copy of the vaccination record if obtained from local pharmacy or Health Dept. Verbalized acceptance and understanding.  Flu Vaccine status: Up to date  Pneumococcal vaccine status: Up to date  Covid-19 vaccine status: Completed vaccines  Qualifies for Shingles Vaccine? Yes   Zostavax completed No   Shingrix Completed?: No.    Education has been provided regarding the importance of this vaccine. Patient has been advised to call insurance company to determine out of pocket expense if they have not yet received this vaccine. Advised may also receive vaccine at local pharmacy or Health Dept. Verbalized acceptance and understanding.  Screening Tests Health Maintenance  Topic Date Due   DTaP/Tdap/Td (1 - Tdap) Never done   Zoster Vaccines- Shingrix (1 of 2) Never done   COVID-19 Vaccine (4 - 2023-24 season) 05/19/2022   Medicare Annual Wellness (AWV)  06/14/2022   INFLUENZA VACCINE  04/19/2023   Pneumonia Vaccine  64+ Years old  Completed   DEXA SCAN  Completed   HPV VACCINES  Aged Out   Hepatitis C Screening  Discontinued    Health Maintenance  Health Maintenance Due  Topic Date Due   DTaP/Tdap/Td (1 - Tdap) Never done   Zoster Vaccines- Shingrix (1 of 2) Never done   COVID-19 Vaccine (4 - 2023-24 season) 05/19/2022   Medicare Annual Wellness (AWV)  06/14/2022    Colorectal cancer screening: No longer required.   Mammogram status: Completed 11/06/2022. Repeat every year  Bone Density status: Completed 06/12/2017.  Lung Cancer Screening: (Low Dose CT Chest recommended if Age 36-80 years, 20 pack-year currently smoking OR have quit w/in 15years.) does not qualify.   Lung Cancer Screening Referral: no  Additional Screening:  Hepatitis C Screening: does not qualify;   Vision Screening: Recommended annual ophthalmology exams for early detection of glaucoma and other disorders of the eye. Is the patient up to date with their annual eye exam?  Yes  Who is the provider or what is the name of the office  in which the patient attends annual eye exams? Dr. Larence Penning If pt is not established with a provider, would they like to be referred to a provider to establish care? No .   Dental Screening: Recommended annual dental exams for proper oral hygiene  Diabetic Foot Exam: n/a  Community Resource Referral / Chronic Care Management: CRR required this visit?  No   CCM required this visit?  No     Plan:     I have personally reviewed and noted the following in the patient's chart:   Medical and social history Use of alcohol, tobacco or illicit drugs  Current medications and supplements including opioid prescriptions. Patient is not currently taking opioid prescriptions. Functional ability and status Nutritional status Physical activity Advanced directives List of other physicians Hospitalizations, surgeries, and ER visits in previous 12 months Vitals Screenings to include cognitive,  depression, and falls Referrals and appointments  In addition, I have reviewed and discussed with patient certain preventive protocols, quality metrics, and best practice recommendations. A written personalized care plan for preventive services as well as general preventive health recommendations were provided to patient.     Barb Merino, LPN   1/61/0960   After Visit Summary: (Pick Up) Due to this being a telephonic visit, with patients personalized plan was offered to patient and patient has requested to Pick up at office.  Nurse Notes: none

## 2023-03-13 DIAGNOSIS — M9903 Segmental and somatic dysfunction of lumbar region: Secondary | ICD-10-CM | POA: Diagnosis not present

## 2023-03-13 DIAGNOSIS — M9902 Segmental and somatic dysfunction of thoracic region: Secondary | ICD-10-CM | POA: Diagnosis not present

## 2023-03-13 DIAGNOSIS — M4134 Thoracogenic scoliosis, thoracic region: Secondary | ICD-10-CM | POA: Diagnosis not present

## 2023-03-13 DIAGNOSIS — Q763 Congenital scoliosis due to congenital bony malformation: Secondary | ICD-10-CM | POA: Diagnosis not present

## 2023-03-13 DIAGNOSIS — M62838 Other muscle spasm: Secondary | ICD-10-CM | POA: Diagnosis not present

## 2023-03-13 DIAGNOSIS — M9901 Segmental and somatic dysfunction of cervical region: Secondary | ICD-10-CM | POA: Diagnosis not present

## 2023-03-19 DIAGNOSIS — M9902 Segmental and somatic dysfunction of thoracic region: Secondary | ICD-10-CM | POA: Diagnosis not present

## 2023-03-19 DIAGNOSIS — M9901 Segmental and somatic dysfunction of cervical region: Secondary | ICD-10-CM | POA: Diagnosis not present

## 2023-03-19 DIAGNOSIS — M9903 Segmental and somatic dysfunction of lumbar region: Secondary | ICD-10-CM | POA: Diagnosis not present

## 2023-03-19 DIAGNOSIS — M4134 Thoracogenic scoliosis, thoracic region: Secondary | ICD-10-CM | POA: Diagnosis not present

## 2023-03-19 DIAGNOSIS — Q763 Congenital scoliosis due to congenital bony malformation: Secondary | ICD-10-CM | POA: Diagnosis not present

## 2023-03-20 DIAGNOSIS — M4134 Thoracogenic scoliosis, thoracic region: Secondary | ICD-10-CM | POA: Diagnosis not present

## 2023-03-20 DIAGNOSIS — M9901 Segmental and somatic dysfunction of cervical region: Secondary | ICD-10-CM | POA: Diagnosis not present

## 2023-03-20 DIAGNOSIS — M9902 Segmental and somatic dysfunction of thoracic region: Secondary | ICD-10-CM | POA: Diagnosis not present

## 2023-03-20 DIAGNOSIS — Q763 Congenital scoliosis due to congenital bony malformation: Secondary | ICD-10-CM | POA: Diagnosis not present

## 2023-03-20 DIAGNOSIS — M9903 Segmental and somatic dysfunction of lumbar region: Secondary | ICD-10-CM | POA: Diagnosis not present

## 2023-03-26 DIAGNOSIS — M9902 Segmental and somatic dysfunction of thoracic region: Secondary | ICD-10-CM | POA: Diagnosis not present

## 2023-03-26 DIAGNOSIS — M9901 Segmental and somatic dysfunction of cervical region: Secondary | ICD-10-CM | POA: Diagnosis not present

## 2023-03-26 DIAGNOSIS — M9903 Segmental and somatic dysfunction of lumbar region: Secondary | ICD-10-CM | POA: Diagnosis not present

## 2023-03-26 DIAGNOSIS — M4134 Thoracogenic scoliosis, thoracic region: Secondary | ICD-10-CM | POA: Diagnosis not present

## 2023-03-26 DIAGNOSIS — Q763 Congenital scoliosis due to congenital bony malformation: Secondary | ICD-10-CM | POA: Diagnosis not present

## 2023-03-29 ENCOUNTER — Other Ambulatory Visit: Payer: Self-pay | Admitting: Family Medicine

## 2023-03-29 DIAGNOSIS — Q763 Congenital scoliosis due to congenital bony malformation: Secondary | ICD-10-CM | POA: Diagnosis not present

## 2023-03-29 DIAGNOSIS — I10 Essential (primary) hypertension: Secondary | ICD-10-CM

## 2023-03-29 DIAGNOSIS — M4134 Thoracogenic scoliosis, thoracic region: Secondary | ICD-10-CM | POA: Diagnosis not present

## 2023-03-29 DIAGNOSIS — M9903 Segmental and somatic dysfunction of lumbar region: Secondary | ICD-10-CM | POA: Diagnosis not present

## 2023-03-29 DIAGNOSIS — M9902 Segmental and somatic dysfunction of thoracic region: Secondary | ICD-10-CM | POA: Diagnosis not present

## 2023-03-29 DIAGNOSIS — M9901 Segmental and somatic dysfunction of cervical region: Secondary | ICD-10-CM | POA: Diagnosis not present

## 2023-03-29 DIAGNOSIS — E782 Mixed hyperlipidemia: Secondary | ICD-10-CM

## 2023-03-29 NOTE — Telephone Encounter (Signed)
Requested Prescriptions  Pending Prescriptions Disp Refills   metoprolol tartrate (LOPRESSOR) 25 MG tablet [Pharmacy Med Name: Metoprolol Tartrate 25 MG Oral Tablet] 200 tablet 0    Sig: TAKE 1 TABLET BY MOUTH TWICE  DAILY     Cardiovascular:  Beta Blockers Passed - 03/29/2023  4:27 AM      Passed - Last BP in normal range    BP Readings from Last 1 Encounters:  01/01/23 134/82         Passed - Last Heart Rate in normal range    Pulse Readings from Last 1 Encounters:  01/01/23 73         Passed - Valid encounter within last 6 months    Recent Outpatient Visits           2 months ago Acute left ankle pain   [place] [facility] [place], Salvadore Oxford, NP   9 months ago Acute cystitis with hematuria   [place] [facility] [place], Netta Neat, DO   10 months ago Annual physical exam   [place] [facility] Smitty Cords, DO   11 months ago Acute left-sided low back pain with left-sided sciatica   [place] [facility] Smitty Cords, DO   1 year ago Acute cystitis without hematuria   [place] [facility] [place], Salvadore Oxford, NP       Future Appointments             In 2 months Althea Charon, Netta Neat, DO [place] [facility]             pravastatin (PRAVACHOL) 20 MG tablet [Pharmacy Med Name: Pravastatin Sodium 20 MG Oral Tablet] 100 tablet 0    Sig: TAKE 1 TABLET BY MOUTH DAILY     Cardiovascular:  Antilipid - Statins Failed - 03/29/2023  4:27 AM      Failed - Lipid Panel in normal range within the last 12 months    Cholesterol, Total  Date Value Ref Range Status  02/08/2016 165 100 - 199 mg/dL Final   Cholesterol  Date Value Ref Range Status  05/24/2022 150 <200 mg/dL Final   LDL Cholesterol (Calc)  Date Value Ref Range Status  05/24/2022 70 mg/dL (calc) Final    Comment:    Reference range: <100 . Desirable range <100 mg/dL  for primary prevention;   <70 mg/dL for patients with CHD or diabetic patients  with > or = 2 CHD risk factors. Marland Kitchen LDL-C is now calculated using the Martin-Hopkins  calculation, which is a validated novel method providing  better accuracy than the Friedewald equation in the  estimation of LDL-C.  Horald Pollen et al. Lenox Ahr. 1610;960(45): 2061-2068  (http://education.[employer].com/faq/FAQ164)    HDL  Date Value Ref Range Status  05/24/2022 66 > OR = 50 mg/dL Final  40/98/1191 79 >47 mg/dL Final   Triglycerides  Date Value Ref Range Status  05/24/2022 59 <150 mg/dL Final         Passed - Patient is not pregnant      Passed - Valid encounter within last 12 months    Recent Outpatient Visits           2 months ago Acute left ankle pain   [place] [facility] [place], Salvadore Oxford, NP   9 months ago Acute cystitis with hematuria   [facility] [place], Netta Neat,  DO   10 months ago Annual physical exam   [place] [facility] Smitty Cords, DO   11 months ago Acute left-sided low back pain with left-sided sciatica   [place] [facility] Smitty Cords, DO   1 year ago Acute cystitis without hematuria   [place] [place], Salvadore Oxford, NP       Future Appointments             In 2 months Althea Charon, Netta Neat, DO [place] [facility]             amLODipine (NORVASC) 5 MG tablet [Pharmacy Med Name: amLODIPine Besylate 5 MG Oral Tablet] 100 tablet 0    Sig: TAKE 1 TABLET BY MOUTH DAILY     Cardiovascular: Calcium Channel Blockers 2 Passed - 03/29/2023  4:27 AM      Passed - Last BP in normal range    BP Readings from Last 1 Encounters:  01/01/23 134/82         Passed - Last Heart Rate in normal range    Pulse Readings from Last 1 Encounters:  01/01/23 73         Passed - Valid encounter within last 6  months    Recent Outpatient Visits           2 months ago Acute left ankle pain   [place] [facility] [place], Salvadore Oxford, NP   9 months ago Acute cystitis with hematuria   [place] [facility] Althea Charon, Netta Neat, DO   10 months ago Annual physical exam   [place] [facility] Smitty Cords, DO   11 months ago Acute left-sided low back pain with left-sided sciatica   [place] [facility] Smitty Cords, DO   1 year ago Acute cystitis without hematuria   [place] [facility] [place], Salvadore Oxford, NP       Future Appointments             In 2 months Althea Charon, Netta Neat, DO [place] [facility]

## 2023-04-02 DIAGNOSIS — M9902 Segmental and somatic dysfunction of thoracic region: Secondary | ICD-10-CM | POA: Diagnosis not present

## 2023-04-02 DIAGNOSIS — M4134 Thoracogenic scoliosis, thoracic region: Secondary | ICD-10-CM | POA: Diagnosis not present

## 2023-04-02 DIAGNOSIS — M9903 Segmental and somatic dysfunction of lumbar region: Secondary | ICD-10-CM | POA: Diagnosis not present

## 2023-04-02 DIAGNOSIS — M9901 Segmental and somatic dysfunction of cervical region: Secondary | ICD-10-CM | POA: Diagnosis not present

## 2023-04-02 DIAGNOSIS — Q763 Congenital scoliosis due to congenital bony malformation: Secondary | ICD-10-CM | POA: Diagnosis not present

## 2023-04-12 IMAGING — CT CT CHEST W/O CM
1 of 2 series · 15 of 29 positions shown, 19 images · non-contrast
Comparison: 12/31/2019

CLINICAL DATA: Follow-up lung nodule

EXAM:
CT CHEST WITHOUT CONTRAST
TECHNIQUE: Multidetector CT imaging of the chest was performed following the
standard protocol without IV contrast.

[Series 3: thorax · axial · 0.64mm/px · z∈[-472,-216]mm · 15 of 146 slices shown, 19 images]
[im 9/146  mediastinal]
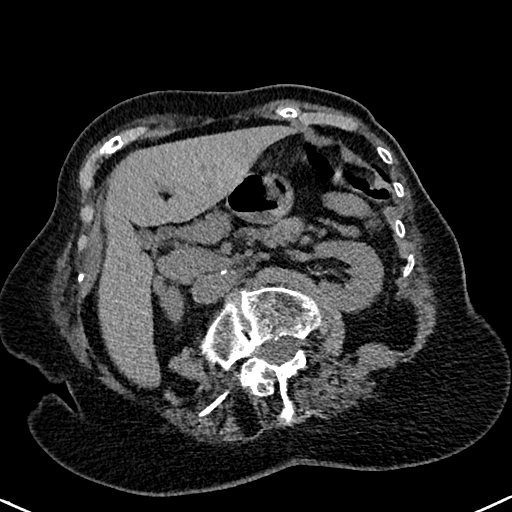
[im 9/146  lung]
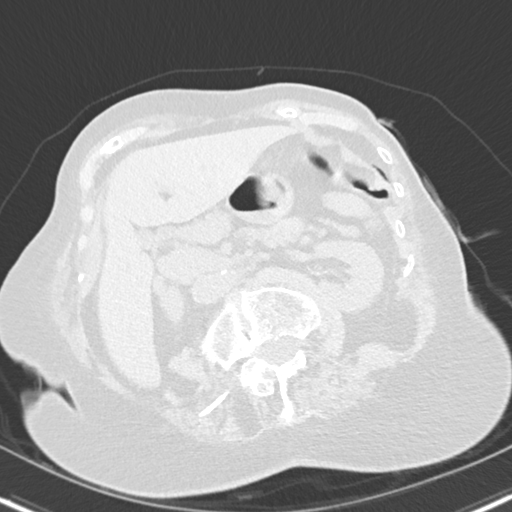
[im 17/146  lung]
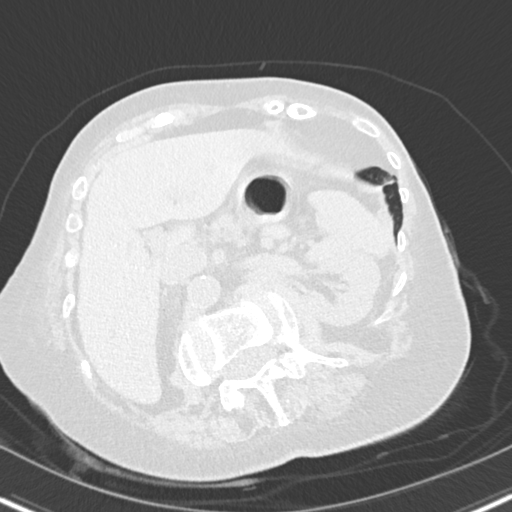
[im 33/146  lung]
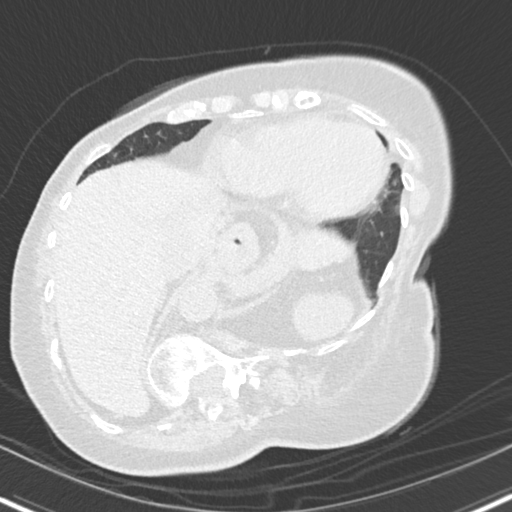
[im 41/146  lung]
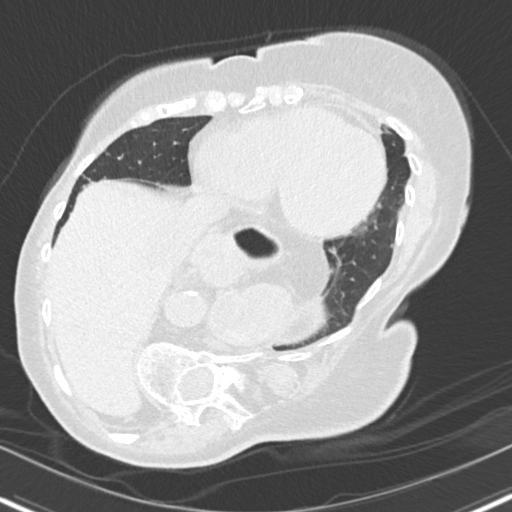
[im 49/146  mediastinal]
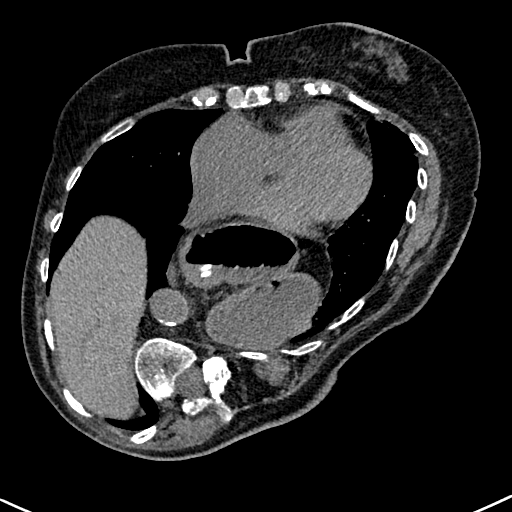
[im 49/146  lung]
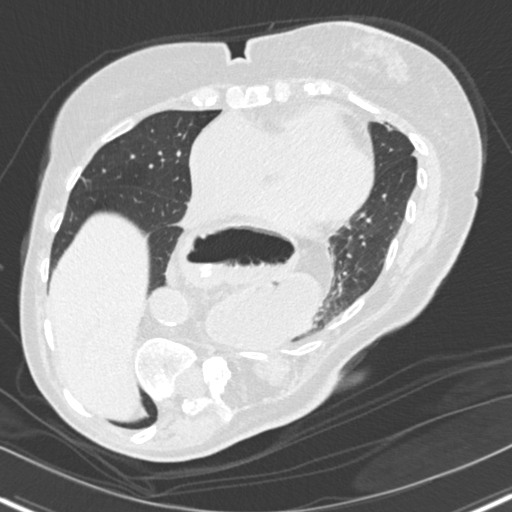
[im 57/146  lung]
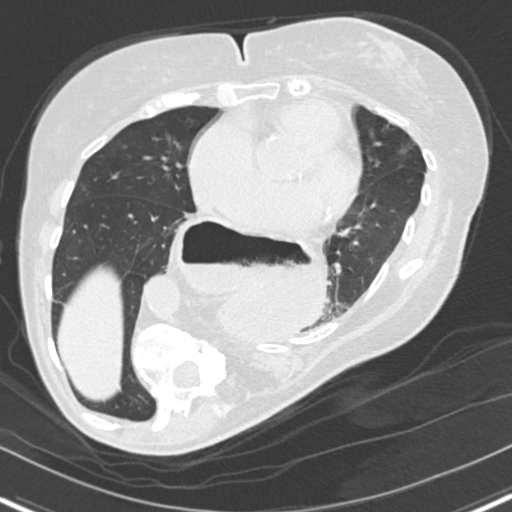
[im 65/146  lung]
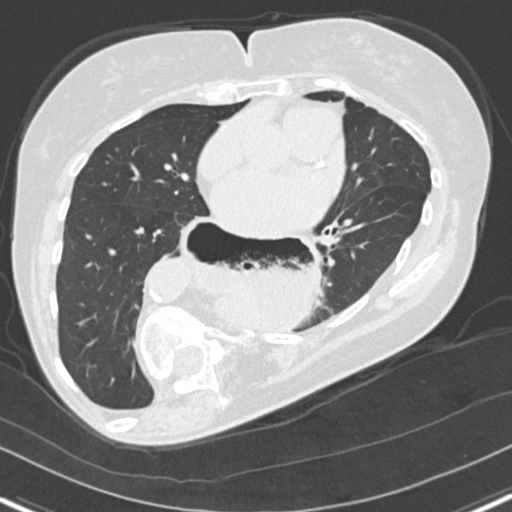
[im 73/146  lung]
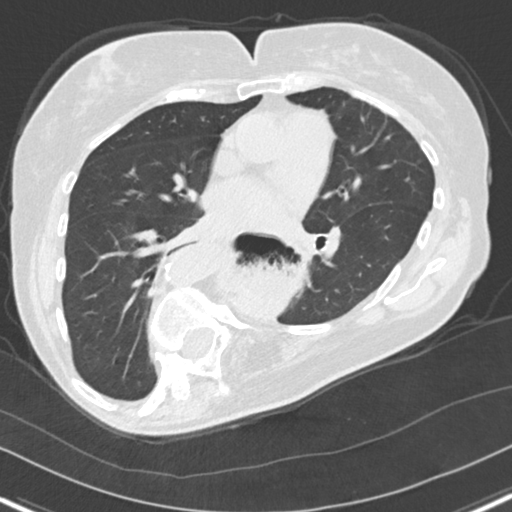
[im 81/146  mediastinal]
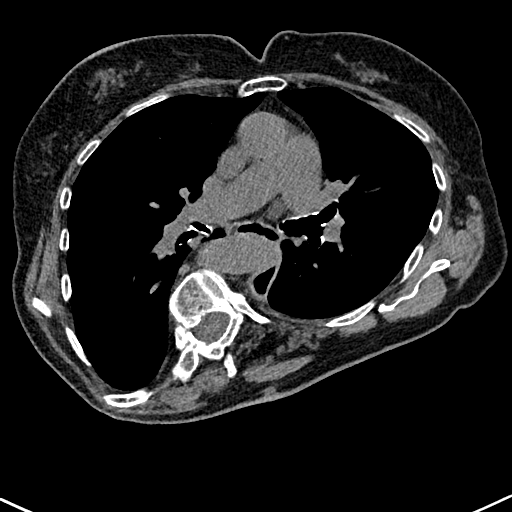
[im 81/146  lung]
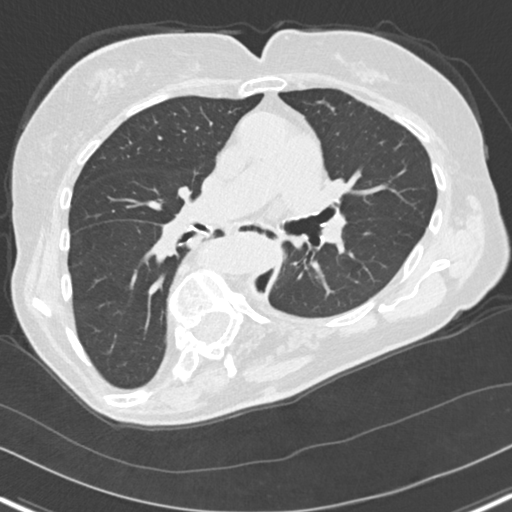
[im 89/146  lung]
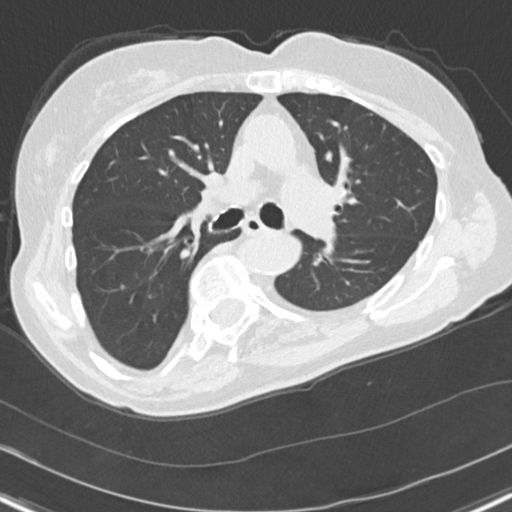
[im 97/146  lung]
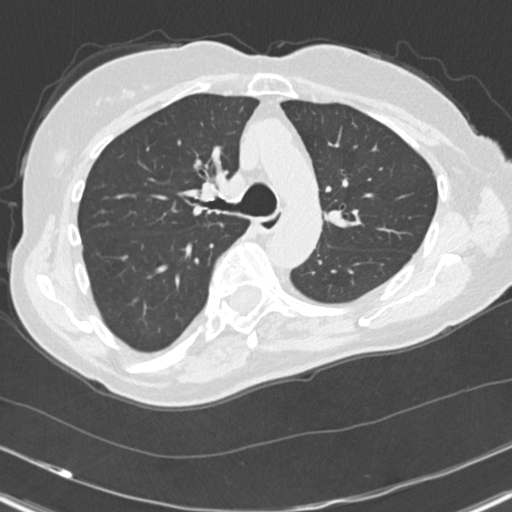
[im 105/146  lung]
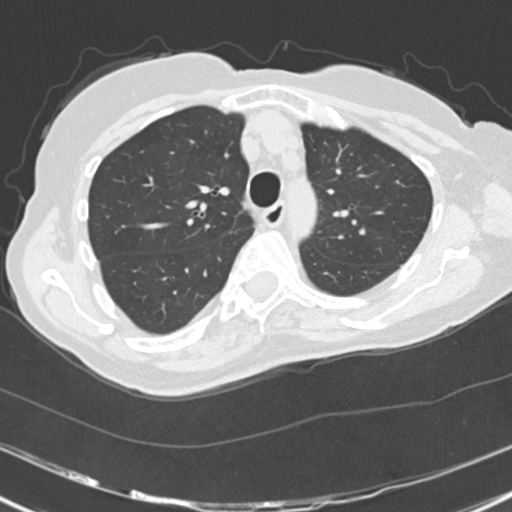
[im 121/146  mediastinal]
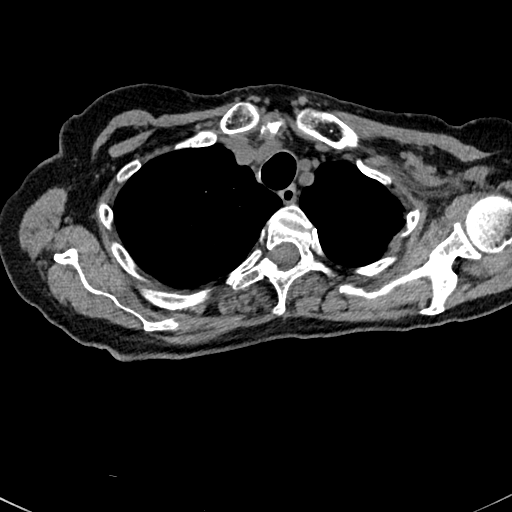
[im 121/146  lung]
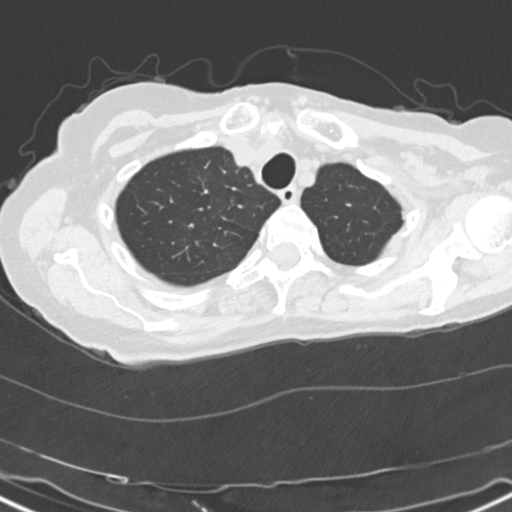
[im 129/146  lung]
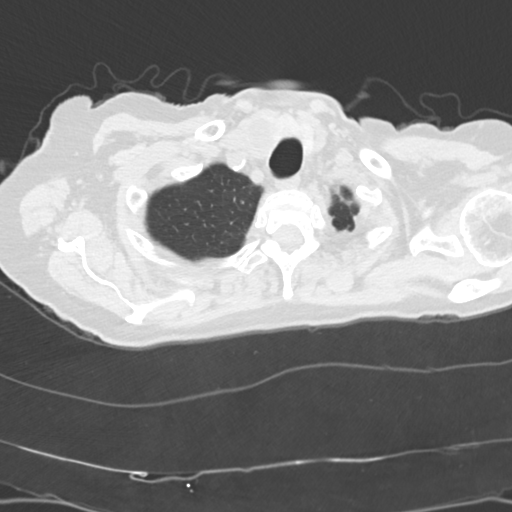
[im 137/146  lung]
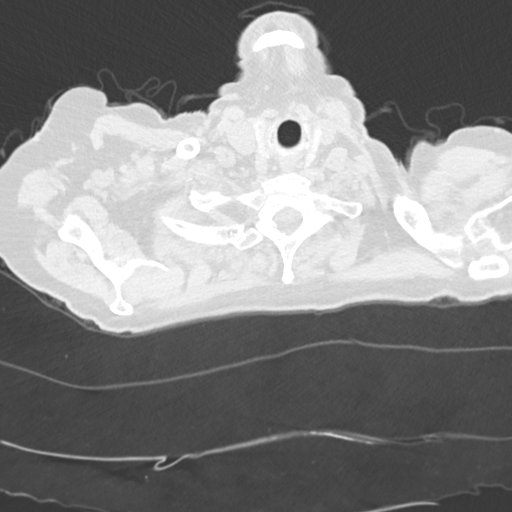

[15 of 29 positions shown; findings below may reference images not displayed]

FINDINGS: Cardiovascular: Aortic atherosclerosis. Normal heart size.
Three-vessel coronary artery calcifications. No pericardial
effusion. Enlargement of the main pulmonary artery measuring up to
3.5 cm in caliber.

Mediastinum/Nodes: No enlarged mediastinal, hilar, or axillary lymph
nodes. Large hiatal hernia with intrathoracic position of the
gastric body and fundus. Unchanged low-attenuation nodules of the
right lobe of the thyroid measuring up to 2.3 cm (series 3, image
18). Trachea, and esophagus demonstrate no significant findings.

Lungs/Pleura: Occasional small pulmonary nodules are stable and
definitively benign, for example a 3 mm subpleural nodule of the
anterior right upper lobe (series 2, image 64). No pleural effusion
or pneumothorax.

Upper Abdomen: No acute abnormality.  Cholelithiasis.

Musculoskeletal: No chest wall mass or suspicious bone lesions
identified. Severe rotary dextroscoliosis of the thoracolumbar spine
with associated disc and facet degenerative disease.
IMPRESSION: 1. Occasional small pulmonary nodules are stable and definitively
benign. No further follow-up or characterization is required.
2. Large hiatal hernia with intrathoracic position of the gastric
body and fundus.
3. Coronary artery disease.
4. Enlargement of the main pulmonary artery, as can be seen in
pulmonary hypertension
5. Cholelithiasis.
6. Unchanged low-attenuation nodules of the right lobe of the
thyroid measuring up to 2.3 cm. As on previous examination,
recommend thyroid US (ref: [HOSPITAL]. [DATE]):

Aortic Atherosclerosis ([id number]).

## 2023-04-16 DIAGNOSIS — Q763 Congenital scoliosis due to congenital bony malformation: Secondary | ICD-10-CM | POA: Diagnosis not present

## 2023-04-16 DIAGNOSIS — M9903 Segmental and somatic dysfunction of lumbar region: Secondary | ICD-10-CM | POA: Diagnosis not present

## 2023-04-16 DIAGNOSIS — M9901 Segmental and somatic dysfunction of cervical region: Secondary | ICD-10-CM | POA: Diagnosis not present

## 2023-04-16 DIAGNOSIS — M4134 Thoracogenic scoliosis, thoracic region: Secondary | ICD-10-CM | POA: Diagnosis not present

## 2023-04-16 DIAGNOSIS — M9902 Segmental and somatic dysfunction of thoracic region: Secondary | ICD-10-CM | POA: Diagnosis not present

## 2023-04-19 DIAGNOSIS — M9903 Segmental and somatic dysfunction of lumbar region: Secondary | ICD-10-CM | POA: Diagnosis not present

## 2023-04-19 DIAGNOSIS — M62838 Other muscle spasm: Secondary | ICD-10-CM | POA: Diagnosis not present

## 2023-04-19 DIAGNOSIS — Q763 Congenital scoliosis due to congenital bony malformation: Secondary | ICD-10-CM | POA: Diagnosis not present

## 2023-04-19 DIAGNOSIS — M4134 Thoracogenic scoliosis, thoracic region: Secondary | ICD-10-CM | POA: Diagnosis not present

## 2023-04-19 DIAGNOSIS — M9902 Segmental and somatic dysfunction of thoracic region: Secondary | ICD-10-CM | POA: Diagnosis not present

## 2023-04-19 DIAGNOSIS — M9901 Segmental and somatic dysfunction of cervical region: Secondary | ICD-10-CM | POA: Diagnosis not present

## 2023-04-23 DIAGNOSIS — Q763 Congenital scoliosis due to congenital bony malformation: Secondary | ICD-10-CM | POA: Diagnosis not present

## 2023-04-23 DIAGNOSIS — M4134 Thoracogenic scoliosis, thoracic region: Secondary | ICD-10-CM | POA: Diagnosis not present

## 2023-04-23 DIAGNOSIS — M9902 Segmental and somatic dysfunction of thoracic region: Secondary | ICD-10-CM | POA: Diagnosis not present

## 2023-04-23 DIAGNOSIS — M9903 Segmental and somatic dysfunction of lumbar region: Secondary | ICD-10-CM | POA: Diagnosis not present

## 2023-04-23 DIAGNOSIS — M9901 Segmental and somatic dysfunction of cervical region: Secondary | ICD-10-CM | POA: Diagnosis not present

## 2023-04-26 DIAGNOSIS — M4134 Thoracogenic scoliosis, thoracic region: Secondary | ICD-10-CM | POA: Diagnosis not present

## 2023-04-26 DIAGNOSIS — M9901 Segmental and somatic dysfunction of cervical region: Secondary | ICD-10-CM | POA: Diagnosis not present

## 2023-04-26 DIAGNOSIS — M9902 Segmental and somatic dysfunction of thoracic region: Secondary | ICD-10-CM | POA: Diagnosis not present

## 2023-04-26 DIAGNOSIS — M9903 Segmental and somatic dysfunction of lumbar region: Secondary | ICD-10-CM | POA: Diagnosis not present

## 2023-04-26 DIAGNOSIS — Q763 Congenital scoliosis due to congenital bony malformation: Secondary | ICD-10-CM | POA: Diagnosis not present

## 2023-04-30 DIAGNOSIS — M9903 Segmental and somatic dysfunction of lumbar region: Secondary | ICD-10-CM | POA: Diagnosis not present

## 2023-04-30 DIAGNOSIS — M9902 Segmental and somatic dysfunction of thoracic region: Secondary | ICD-10-CM | POA: Diagnosis not present

## 2023-04-30 DIAGNOSIS — M4134 Thoracogenic scoliosis, thoracic region: Secondary | ICD-10-CM | POA: Diagnosis not present

## 2023-04-30 DIAGNOSIS — Q763 Congenital scoliosis due to congenital bony malformation: Secondary | ICD-10-CM | POA: Diagnosis not present

## 2023-04-30 DIAGNOSIS — M9901 Segmental and somatic dysfunction of cervical region: Secondary | ICD-10-CM | POA: Diagnosis not present

## 2023-05-03 DIAGNOSIS — M9901 Segmental and somatic dysfunction of cervical region: Secondary | ICD-10-CM | POA: Diagnosis not present

## 2023-05-03 DIAGNOSIS — M4134 Thoracogenic scoliosis, thoracic region: Secondary | ICD-10-CM | POA: Diagnosis not present

## 2023-05-03 DIAGNOSIS — Q763 Congenital scoliosis due to congenital bony malformation: Secondary | ICD-10-CM | POA: Diagnosis not present

## 2023-05-03 DIAGNOSIS — M9902 Segmental and somatic dysfunction of thoracic region: Secondary | ICD-10-CM | POA: Diagnosis not present

## 2023-05-03 DIAGNOSIS — M9903 Segmental and somatic dysfunction of lumbar region: Secondary | ICD-10-CM | POA: Diagnosis not present

## 2023-05-07 DIAGNOSIS — Q763 Congenital scoliosis due to congenital bony malformation: Secondary | ICD-10-CM | POA: Diagnosis not present

## 2023-05-07 DIAGNOSIS — M9902 Segmental and somatic dysfunction of thoracic region: Secondary | ICD-10-CM | POA: Diagnosis not present

## 2023-05-07 DIAGNOSIS — M9903 Segmental and somatic dysfunction of lumbar region: Secondary | ICD-10-CM | POA: Diagnosis not present

## 2023-05-07 DIAGNOSIS — M4134 Thoracogenic scoliosis, thoracic region: Secondary | ICD-10-CM | POA: Diagnosis not present

## 2023-05-07 DIAGNOSIS — M9901 Segmental and somatic dysfunction of cervical region: Secondary | ICD-10-CM | POA: Diagnosis not present

## 2023-05-10 DIAGNOSIS — M9903 Segmental and somatic dysfunction of lumbar region: Secondary | ICD-10-CM | POA: Diagnosis not present

## 2023-05-10 DIAGNOSIS — Q763 Congenital scoliosis due to congenital bony malformation: Secondary | ICD-10-CM | POA: Diagnosis not present

## 2023-05-10 DIAGNOSIS — M9901 Segmental and somatic dysfunction of cervical region: Secondary | ICD-10-CM | POA: Diagnosis not present

## 2023-05-10 DIAGNOSIS — M4134 Thoracogenic scoliosis, thoracic region: Secondary | ICD-10-CM | POA: Diagnosis not present

## 2023-05-10 DIAGNOSIS — M9902 Segmental and somatic dysfunction of thoracic region: Secondary | ICD-10-CM | POA: Diagnosis not present

## 2023-05-14 DIAGNOSIS — Z85828 Personal history of other malignant neoplasm of skin: Secondary | ICD-10-CM | POA: Diagnosis not present

## 2023-05-14 DIAGNOSIS — D485 Neoplasm of uncertain behavior of skin: Secondary | ICD-10-CM | POA: Diagnosis not present

## 2023-05-14 DIAGNOSIS — M4134 Thoracogenic scoliosis, thoracic region: Secondary | ICD-10-CM | POA: Diagnosis not present

## 2023-05-14 DIAGNOSIS — Z872 Personal history of diseases of the skin and subcutaneous tissue: Secondary | ICD-10-CM | POA: Diagnosis not present

## 2023-05-14 DIAGNOSIS — L720 Epidermal cyst: Secondary | ICD-10-CM | POA: Diagnosis not present

## 2023-05-14 DIAGNOSIS — L578 Other skin changes due to chronic exposure to nonionizing radiation: Secondary | ICD-10-CM | POA: Diagnosis not present

## 2023-05-14 DIAGNOSIS — M9901 Segmental and somatic dysfunction of cervical region: Secondary | ICD-10-CM | POA: Diagnosis not present

## 2023-05-14 DIAGNOSIS — C44319 Basal cell carcinoma of skin of other parts of face: Secondary | ICD-10-CM | POA: Diagnosis not present

## 2023-05-14 DIAGNOSIS — M9902 Segmental and somatic dysfunction of thoracic region: Secondary | ICD-10-CM | POA: Diagnosis not present

## 2023-05-14 DIAGNOSIS — Z859 Personal history of malignant neoplasm, unspecified: Secondary | ICD-10-CM | POA: Diagnosis not present

## 2023-05-14 DIAGNOSIS — M9903 Segmental and somatic dysfunction of lumbar region: Secondary | ICD-10-CM | POA: Diagnosis not present

## 2023-05-14 DIAGNOSIS — Q763 Congenital scoliosis due to congenital bony malformation: Secondary | ICD-10-CM | POA: Diagnosis not present

## 2023-05-17 DIAGNOSIS — M9901 Segmental and somatic dysfunction of cervical region: Secondary | ICD-10-CM | POA: Diagnosis not present

## 2023-05-17 DIAGNOSIS — M9903 Segmental and somatic dysfunction of lumbar region: Secondary | ICD-10-CM | POA: Diagnosis not present

## 2023-05-17 DIAGNOSIS — M4134 Thoracogenic scoliosis, thoracic region: Secondary | ICD-10-CM | POA: Diagnosis not present

## 2023-05-17 DIAGNOSIS — Q763 Congenital scoliosis due to congenital bony malformation: Secondary | ICD-10-CM | POA: Diagnosis not present

## 2023-05-17 DIAGNOSIS — M9902 Segmental and somatic dysfunction of thoracic region: Secondary | ICD-10-CM | POA: Diagnosis not present

## 2023-05-22 DIAGNOSIS — Q763 Congenital scoliosis due to congenital bony malformation: Secondary | ICD-10-CM | POA: Diagnosis not present

## 2023-05-22 DIAGNOSIS — M9902 Segmental and somatic dysfunction of thoracic region: Secondary | ICD-10-CM | POA: Diagnosis not present

## 2023-05-22 DIAGNOSIS — M4134 Thoracogenic scoliosis, thoracic region: Secondary | ICD-10-CM | POA: Diagnosis not present

## 2023-05-22 DIAGNOSIS — M9901 Segmental and somatic dysfunction of cervical region: Secondary | ICD-10-CM | POA: Diagnosis not present

## 2023-05-22 DIAGNOSIS — M9903 Segmental and somatic dysfunction of lumbar region: Secondary | ICD-10-CM | POA: Diagnosis not present

## 2023-05-22 DIAGNOSIS — M62838 Other muscle spasm: Secondary | ICD-10-CM | POA: Diagnosis not present

## 2023-05-24 DIAGNOSIS — M9903 Segmental and somatic dysfunction of lumbar region: Secondary | ICD-10-CM | POA: Diagnosis not present

## 2023-05-24 DIAGNOSIS — M4134 Thoracogenic scoliosis, thoracic region: Secondary | ICD-10-CM | POA: Diagnosis not present

## 2023-05-24 DIAGNOSIS — M62838 Other muscle spasm: Secondary | ICD-10-CM | POA: Diagnosis not present

## 2023-05-24 DIAGNOSIS — M9902 Segmental and somatic dysfunction of thoracic region: Secondary | ICD-10-CM | POA: Diagnosis not present

## 2023-05-24 DIAGNOSIS — M9901 Segmental and somatic dysfunction of cervical region: Secondary | ICD-10-CM | POA: Diagnosis not present

## 2023-05-28 ENCOUNTER — Other Ambulatory Visit: Payer: Medicare Other

## 2023-05-28 DIAGNOSIS — R7303 Prediabetes: Secondary | ICD-10-CM

## 2023-05-28 DIAGNOSIS — M62838 Other muscle spasm: Secondary | ICD-10-CM | POA: Diagnosis not present

## 2023-05-28 DIAGNOSIS — D509 Iron deficiency anemia, unspecified: Secondary | ICD-10-CM | POA: Diagnosis not present

## 2023-05-28 DIAGNOSIS — E782 Mixed hyperlipidemia: Secondary | ICD-10-CM | POA: Diagnosis not present

## 2023-05-28 DIAGNOSIS — M4134 Thoracogenic scoliosis, thoracic region: Secondary | ICD-10-CM | POA: Diagnosis not present

## 2023-05-28 DIAGNOSIS — M9901 Segmental and somatic dysfunction of cervical region: Secondary | ICD-10-CM | POA: Diagnosis not present

## 2023-05-28 DIAGNOSIS — Z0000 Encounter for general adult medical examination without abnormal findings: Secondary | ICD-10-CM | POA: Diagnosis not present

## 2023-05-28 DIAGNOSIS — I10 Essential (primary) hypertension: Secondary | ICD-10-CM | POA: Diagnosis not present

## 2023-05-28 DIAGNOSIS — M9902 Segmental and somatic dysfunction of thoracic region: Secondary | ICD-10-CM | POA: Diagnosis not present

## 2023-05-28 DIAGNOSIS — M9903 Segmental and somatic dysfunction of lumbar region: Secondary | ICD-10-CM | POA: Diagnosis not present

## 2023-05-29 LAB — CBC WITH DIFFERENTIAL/PLATELET
Absolute Monocytes: 409 {cells}/uL (ref 200–950)
Basophils Absolute: 20 {cells}/uL (ref 0–200)
Basophils Relative: 0.7 %
Eosinophils Absolute: 70 {cells}/uL (ref 15–500)
Eosinophils Relative: 2.4 %
HCT: 31.4 % — ABNORMAL LOW (ref 35.0–45.0)
Hemoglobin: 10.5 g/dL — ABNORMAL LOW (ref 11.7–15.5)
Lymphs Abs: 876 {cells}/uL (ref 850–3900)
MCH: 37 pg — ABNORMAL HIGH (ref 27.0–33.0)
MCHC: 33.4 g/dL (ref 32.0–36.0)
MCV: 110.6 fL — ABNORMAL HIGH (ref 80.0–100.0)
MPV: 13.1 fL — ABNORMAL HIGH (ref 7.5–12.5)
Monocytes Relative: 14.1 %
Neutro Abs: 1525 {cells}/uL (ref 1500–7800)
Neutrophils Relative %: 52.6 %
Platelets: 123 10*3/uL — ABNORMAL LOW (ref 140–400)
RBC: 2.84 10*6/uL — ABNORMAL LOW (ref 3.80–5.10)
RDW: 13.1 % (ref 11.0–15.0)
Total Lymphocyte: 30.2 %
WBC: 2.9 10*3/uL — ABNORMAL LOW (ref 3.8–10.8)

## 2023-05-29 LAB — COMPLETE METABOLIC PANEL WITHOUT GFR
AG Ratio: 2 (calc) (ref 1.0–2.5)
ALT: 13 U/L (ref 6–29)
AST: 16 U/L (ref 10–35)
Albumin: 4.5 g/dL (ref 3.6–5.1)
Alkaline phosphatase (APISO): 34 U/L — ABNORMAL LOW (ref 37–153)
BUN: 20 mg/dL (ref 7–25)
CO2: 29 mmol/L (ref 20–32)
Calcium: 9.8 mg/dL (ref 8.6–10.4)
Chloride: 103 mmol/L (ref 98–110)
Creat: 0.8 mg/dL (ref 0.60–0.95)
Globulin: 2.2 g/dL (ref 1.9–3.7)
Glucose, Bld: 115 mg/dL — ABNORMAL HIGH (ref 65–99)
Potassium: 3.9 mmol/L (ref 3.5–5.3)
Sodium: 140 mmol/L (ref 135–146)
Total Bilirubin: 0.7 mg/dL (ref 0.2–1.2)
Total Protein: 6.7 g/dL (ref 6.1–8.1)
eGFR: 74 mL/min/1.73m2

## 2023-05-29 LAB — IRON,TIBC AND FERRITIN PANEL
%SAT: 39 % (ref 16–45)
Ferritin: 42 ng/mL (ref 16–288)
Iron: 151 ug/dL (ref 45–160)
TIBC: 387 ug/dL (ref 250–450)

## 2023-05-29 LAB — HEMOGLOBIN A1C
Hgb A1c MFr Bld: 6.2 %{Hb} — ABNORMAL HIGH (ref <5.7)
Mean Plasma Glucose: 131 mg/dL
eAG (mmol/L): 7.3 mmol/L

## 2023-05-29 LAB — TSH: TSH: 3.79 m[IU]/L (ref 0.40–4.50)

## 2023-05-29 LAB — LIPID PANEL
Cholesterol: 165 mg/dL (ref <200)
HDL: 68 mg/dL (ref >=50)
LDL Cholesterol (Calc): 81 mg/dL
Non-HDL Cholesterol (Calc): 97 mg/dL (ref <130)
Total CHOL/HDL Ratio: 2.4 (calc) (ref <5.0)
Triglycerides: 78 mg/dL (ref <150)

## 2023-05-29 LAB — T4, FREE: Free T4: 1.1 ng/dL (ref 0.8–1.8)

## 2023-05-31 DIAGNOSIS — M9903 Segmental and somatic dysfunction of lumbar region: Secondary | ICD-10-CM | POA: Diagnosis not present

## 2023-05-31 DIAGNOSIS — M9902 Segmental and somatic dysfunction of thoracic region: Secondary | ICD-10-CM | POA: Diagnosis not present

## 2023-05-31 DIAGNOSIS — M9901 Segmental and somatic dysfunction of cervical region: Secondary | ICD-10-CM | POA: Diagnosis not present

## 2023-05-31 DIAGNOSIS — M4134 Thoracogenic scoliosis, thoracic region: Secondary | ICD-10-CM | POA: Diagnosis not present

## 2023-05-31 DIAGNOSIS — M62838 Other muscle spasm: Secondary | ICD-10-CM | POA: Diagnosis not present

## 2023-06-04 ENCOUNTER — Other Ambulatory Visit: Payer: Self-pay | Admitting: Family Medicine

## 2023-06-04 ENCOUNTER — Encounter: Payer: Self-pay | Admitting: Family Medicine

## 2023-06-04 ENCOUNTER — Ambulatory Visit (INDEPENDENT_AMBULATORY_CARE_PROVIDER_SITE_OTHER): Payer: Medicare Other | Admitting: Family Medicine

## 2023-06-04 VITALS — BP 122/64 | HR 66 | Ht 61.5 in | Wt 141.2 lb

## 2023-06-04 DIAGNOSIS — R7303 Prediabetes: Secondary | ICD-10-CM | POA: Diagnosis not present

## 2023-06-04 DIAGNOSIS — D509 Iron deficiency anemia, unspecified: Secondary | ICD-10-CM

## 2023-06-04 DIAGNOSIS — E782 Mixed hyperlipidemia: Secondary | ICD-10-CM | POA: Diagnosis not present

## 2023-06-04 DIAGNOSIS — Z0000 Encounter for general adult medical examination without abnormal findings: Secondary | ICD-10-CM

## 2023-06-04 DIAGNOSIS — Z23 Encounter for immunization: Secondary | ICD-10-CM | POA: Diagnosis not present

## 2023-06-04 DIAGNOSIS — M9901 Segmental and somatic dysfunction of cervical region: Secondary | ICD-10-CM | POA: Diagnosis not present

## 2023-06-04 DIAGNOSIS — I10 Essential (primary) hypertension: Secondary | ICD-10-CM

## 2023-06-04 DIAGNOSIS — M9902 Segmental and somatic dysfunction of thoracic region: Secondary | ICD-10-CM | POA: Diagnosis not present

## 2023-06-04 DIAGNOSIS — M41124 Adolescent idiopathic scoliosis, thoracic region: Secondary | ICD-10-CM | POA: Diagnosis not present

## 2023-06-04 DIAGNOSIS — M62838 Other muscle spasm: Secondary | ICD-10-CM | POA: Diagnosis not present

## 2023-06-04 DIAGNOSIS — M9903 Segmental and somatic dysfunction of lumbar region: Secondary | ICD-10-CM | POA: Diagnosis not present

## 2023-06-04 DIAGNOSIS — M4134 Thoracogenic scoliosis, thoracic region: Secondary | ICD-10-CM | POA: Diagnosis not present

## 2023-06-04 NOTE — Progress Notes (Signed)
Subjective:    Patient ID: Rebecca Mitchell, female    DOB: Jan 16, 1941, 82 y.o.   MRN: [id number]  Rebecca Mitchell is a 82 y.o. female presenting on 06/04/2023 for Annual Exam   HPI  Here for Annual Physical and lab Review.   Elevated A1c Elevated A1c to 5.8 up to 6.2 now CBGs: Not checking, not indicated Meds: Never on meds Currently on ARB Lifestyle: - Diet (admits goal to limit excess sugar carb and starch) - Exercise (less regular exercise now) Denies hypoglycemia   HYPERLIPIDEMIA: - Reports no concerns. Last lipid panel 05/2023 TC 165 and LDL 81 - Currently taking Pravastatin 20mg  daily, tolerating well without side effects or myalgias   CHRONIC HTN: Reports no new concerns. Checks BP at home normal range Current Meds - Amlodipine 5mg  daily, Losartan-HCTZ 100-25mg  daily, Metoprolol 25mg  BID Reports good compliance, took meds today. Tolerating well, w/o complaints.   Chronic Scoliosis / Chronic Back Pain - Reports chronic problem with scoliosis since adolescent. Previously followed Dr Sharolyn Douglas (Spine & Scoliosis Specialist in [place]). Also has seen [facility] Dr Noralyn Pick, and some back pain and sciatica She takes Tylenol  x2 once or twice a day if needs She tries to rest and avoid straining her back if possible. She does go to chiropractor still frequently with good results. Doing managing pain and soreness. Admits less walking, worse with walking   Anemia On iron 3x  week, doing well, prior problem. Last Hgb mild reduced Hgb 10.5 is stable. Denies any fatigue or weakness or symptoms   History of Breast Cancer Mammogram surveillance per Gen Surgery Previously on Letrozole   Severe Osteoporosis Followed by [facility] Dr Tedd Sias - on prolia, followed with DEXA Monitoring    Reduced Hearing, Bilateral Has hearing aid bilateral   Health Maintenance:  Flu Shot today  Decline COVID and Shingrix    Dermatology Dr Ebony Cargo removed basal  cell carcinoma off nose prior year     06/04/2023    8:55 AM 03/07/2023    3:05 PM 05/29/2022    9:02 AM  Depression screen PHQ 2/9  Decreased Interest 0 0 0  Down, Depressed, Hopeless 0 0 0  PHQ - 2 Score 0 0 0  Altered sleeping   0  Tired, decreased energy   1  Change in appetite   0  Feeling bad or failure about yourself    0  Trouble concentrating   0  Moving slowly or fidgety/restless   0  Suicidal thoughts   0  PHQ-9 Score   1  Difficult doing work/chores   Not difficult at all    Past Medical History:  Diagnosis Date   Anemia    Breast cancer (HCC) 09/2014   left breast lumpectomy with rad tx   Hyperlipidemia    Malignant neoplasm of lower-inner quadrant of left female breast (HCC) 07/2014   5 mm,T1a, N0; ER/ PR positive, HER-2/neu negative   Osteoporosis    Personal history of radiation therapy 2016   left breast ca   Past Surgical History:  Procedure Laterality Date   BREAST BIOPSY Left 08/2014   invasive mammary carcinoma   BREAST LUMPECTOMY Left 10/01/2014   invasive mammary carcinoma and DCIS with clear margins   BREAST SURGERY Left 10/01/2014   lumpectomy   COLONOSCOPY  2010   COLONOSCOPY WITH PROPOFOL N/A 09/21/2015   Procedure: COLONOSCOPY WITH PROPOFOL;  Surgeon: Kieth Brightly, MD;  Location: [facility] ENDOSCOPY;  Service: Endoscopy;  Laterality: N/A;   UPPER GI ENDOSCOPY  2014   Social History   Socioeconomic History   Marital status: Married    Spouse name: Not on file   Number of children: Not on file   Years of education: Not on file   Highest education level: Bachelor's degree (e.g., BA, AB, BS)  Occupational History   Not on file  Tobacco Use   Smoking status: Never   Smokeless tobacco: Never  Vaping Use   Vaping status: Never Used  Substance and Sexual Activity   Alcohol use: No    Alcohol/week: 0.0 standard drinks of alcohol   Drug use: No   Sexual activity: Not on file  Other Topics Concern   Not on file  Social History  Narrative   Not on file   Social Determinants of Health   Financial Resource Strain: Low Risk  (03/07/2023)   Overall Financial Resource Strain (CARDIA)    Difficulty of Paying Living Expenses: Not hard at all  Food Insecurity: No Food Insecurity (03/07/2023)   Hunger Vital Sign    Worried About Running Out of Food in the Last Year: Never true    Ran Out of Food in the Last Year: Never true  Transportation Needs: No Transportation Needs (03/07/2023)   PRAPARE - Administrator, Civil Service (Medical): No    Lack of Transportation (Non-Medical): No  Physical Activity: Inactive (03/07/2023)   Exercise Vital Sign    Days of Exercise per Week: 0 days    Minutes of Exercise per Session: 0 min  Stress: No Stress Concern Present (03/07/2023)   [employer] of Occupational Health - Occupational Stress Questionnaire    Feeling of Stress : Not at all  Social Connections: Patient Declined (03/07/2023)   Social Connection and Isolation Panel [NHANES]    Frequency of Communication with Friends and Family: Patient declined    Frequency of Social Gatherings with Friends and Family: Patient declined    Attends Religious Services: Patient declined    Database administrator or Organizations: Patient declined    Attends Banker Meetings: Patient declined    Marital Status: Patient declined  Intimate Partner Violence: Not At Risk (03/07/2023)   Humiliation, Afraid, Rape, and Kick questionnaire    Fear of Current or Ex-Partner: No    Emotionally Abused: No    Physically Abused: No    Sexually Abused: No   Family History  Problem Relation Age of Onset   Breast cancer Sister 17   Heart disease Mother    Heart disease Father    Current Outpatient Medications on File Prior to Visit  Medication Sig   albuterol (VENTOLIN HFA) 108 (90 Base) MCG/ACT inhaler    amLODipine (NORVASC) 5 MG tablet TAKE 1 TABLET BY MOUTH DAILY   Ascorbic Acid (VITAMIN C) 1000 MG tablet Take 1,000  mg by mouth daily.   aspirin EC 81 MG tablet Take 81 mg by mouth daily.   calcium carbonate 100 mg/ml SUSP Take by mouth.   Cholecalciferol (VITAMIN D3) 10 MCG (400 UNIT) CAPS Take by mouth.   co-enzyme Q-10 30 MG capsule Take 30 mg by mouth 3 (three) times daily.   FERROUS SULFATE PO Take 1 tablet by mouth once a week.   fluticasone (FLONASE) 50 MCG/ACT nasal spray USE 2 SPRAYS IN EACH NOSTRIL ONCE DAILY FOR UP TO 4 TO 6 WEEKS, THEN AS NEEDED FOR ALLERGIES   losartan-hydrochlorothiazide (HYZAAR) 100-25 MG tablet Take 1 tablet by  mouth daily.   Lutein-Zeaxanthin 15-0.7 MG CAPS Take 1 capsule by mouth daily.   metoprolol tartrate (LOPRESSOR) 25 MG tablet TAKE 1 TABLET BY MOUTH TWICE  DAILY   Omega-3 Fatty Acids (FISH OIL PO) Take 1 capsule by mouth daily.   pravastatin (PRAVACHOL) 20 MG tablet TAKE 1 TABLET BY MOUTH DAILY   Saccharomyces boulardii (PROBIOTIC) 250 MG CAPS Take 250 mg by mouth daily.   zoledronic acid (RECLAST) 5 MG/100ML SOLN injection Inject 5 mg into the vein once.   loratadine (CLARITIN) 10 MG tablet Take 1 tablet (10 mg total) by mouth daily. For up to 1 month, then as needed for seasonal allergies. (Patient not taking: Reported on 06/04/2023)   No current facility-administered medications on file prior to visit.    Review of Systems  Constitutional:  Negative for activity change, appetite change, chills, diaphoresis, fatigue and fever.  HENT:  Negative for congestion and hearing loss.   Eyes:  Negative for visual disturbance.  Respiratory:  Negative for cough, chest tightness, shortness of breath and wheezing.   Cardiovascular:  Negative for chest pain, palpitations and leg swelling.  Gastrointestinal:  Negative for abdominal pain, constipation, diarrhea, nausea and vomiting.  Genitourinary:  Negative for dysuria, frequency and hematuria.  Musculoskeletal:  Positive for back pain. Negative for arthralgias and neck pain.  Skin:  Negative for rash.  Neurological:   Negative for dizziness, weakness, light-headedness, numbness and headaches.  Hematological:  Negative for adenopathy.  Psychiatric/Behavioral:  Negative for behavioral problems, dysphoric mood and sleep disturbance.    Per HPI unless specifically indicated above      Objective:    BP 122/64   Pulse 66   Ht 5' 1.5" (1.562 m)   Wt 141 lb 3.2 oz (64 kg)   SpO2 95%   BMI 26.25 kg/m   Wt Readings from Last 3 Encounters:  06/04/23 141 lb 3.2 oz (64 kg)  03/07/23 140 lb (63.5 kg)  01/01/23 142 lb (64.4 kg)    Physical Exam Vitals and nursing note reviewed.  Constitutional:      General: She is not in acute distress.    Appearance: She is well-developed. She is not diaphoretic.     Comments: Well-appearing, comfortable, cooperative  HENT:     Head: Normocephalic and atraumatic.  Eyes:     General:        Right eye: No discharge.        Left eye: No discharge.     Conjunctiva/sclera: Conjunctivae normal.     Pupils: Pupils are equal, round, and reactive to light.  Neck:     Thyroid: No thyromegaly.  Cardiovascular:     Rate and Rhythm: Normal rate and regular rhythm.     Pulses: Normal pulses.     Heart sounds: Normal heart sounds. No murmur heard. Pulmonary:     Effort: Pulmonary effort is normal. No respiratory distress.     Breath sounds: Normal breath sounds. No wheezing or rales.  Abdominal:     General: Bowel sounds are normal. There is no distension.     Palpations: Abdomen is soft. There is no mass.     Tenderness: There is no abdominal tenderness.  Musculoskeletal:        General: No tenderness.     Cervical back: Normal range of motion and neck supple.     Right lower leg: Edema present.     Left lower leg: Edema present.     Comments: Advanced Dextroscoliosis, stable, chronic  Upper / Lower Extremities: - Normal muscle tone, strength bilateral upper extremities 5/5, lower extremities 5/5  Lymphadenopathy:     Cervical: No cervical adenopathy.  Skin:     General: Skin is warm and dry.     Findings: No erythema or rash.  Neurological:     Mental Status: She is alert and oriented to Mitchell, place, and time.     Comments: Distal sensation intact to light touch all extremities  Psychiatric:        Mood and Affect: Mood normal.        Behavior: Behavior normal.        Thought Content: Thought content normal.     Comments: Well groomed, good eye contact, normal speech and thoughts      Results for orders placed or performed in visit on 05/28/23  Iron, TIBC and Ferritin Panel  Result Value Ref Range   Iron 151 45 - 160 mcg/dL   TIBC 161 096 - 045 mcg/dL (calc)   %SAT 39 16 - 45 % (calc)   Ferritin 42 16 - 288 ng/mL  T4, free  Result Value Ref Range   Free T4 1.1 0.8 - 1.8 ng/dL  TSH  Result Value Ref Range   TSH 3.79 0.40 - 4.50 mIU/L  Hemoglobin A1c  Result Value Ref Range   Hgb A1c MFr Bld 6.2 (H) <5.7 % of total Hgb   Mean Plasma Glucose 131 mg/dL   eAG (mmol/L) 7.3 mmol/L  Lipid panel  Result Value Ref Range   Cholesterol 165 <200 mg/dL   HDL 68 > OR = 50 mg/dL   Triglycerides 78 <409 mg/dL   LDL Cholesterol (Calc) 81 mg/dL (calc)   Total CHOL/HDL Ratio 2.4 <5.0 (calc)   Non-HDL Cholesterol (Calc) 97 <811 mg/dL (calc)  CBC with Differential/Platelet  Result Value Ref Range   WBC 2.9 (L) 3.8 - 10.8 Thousand/uL   RBC 2.84 (L) 3.80 - 5.10 Million/uL   Hemoglobin 10.5 (L) 11.7 - 15.5 g/dL   HCT 91.4 (L) 78.2 - 95.6 %   MCV 110.6 (H) 80.0 - 100.0 fL   MCH 37.0 (H) 27.0 - 33.0 pg   MCHC 33.4 32.0 - 36.0 g/dL   RDW 21.3 08.6 - 57.8 %   Platelets 123 (L) 140 - 400 Thousand/uL   MPV 13.1 (H) 7.5 - 12.5 fL   Neutro Abs 1,525 1,500 - 7,800 cells/uL   Lymphs Abs 876 850 - 3,900 cells/uL   Absolute Monocytes 409 200 - 950 cells/uL   Eosinophils Absolute 70 15 - 500 cells/uL   Basophils Absolute 20 0 - 200 cells/uL   Neutrophils Relative % 52.6 %   Total Lymphocyte 30.2 %   Monocytes Relative 14.1 %   Eosinophils Relative  2.4 %   Basophils Relative 0.7 %  COMPLETE METABOLIC PANEL WITH GFR  Result Value Ref Range   Glucose, Bld 115 (H) 65 - 99 mg/dL   BUN 20 7 - 25 mg/dL   Creat 4.69 6.29 - 5.28 mg/dL   eGFR 74 > OR = 60 UX/LKG/4.01U2   BUN/Creatinine Ratio SEE NOTE: 6 - 22 (calc)   Sodium 140 135 - 146 mmol/L   Potassium 3.9 3.5 - 5.3 mmol/L   Chloride 103 98 - 110 mmol/L   CO2 29 20 - 32 mmol/L   Calcium 9.8 8.6 - 10.4 mg/dL   Total Protein 6.7 6.1 - 8.1 g/dL   Albumin 4.5 3.6 - 5.1 g/dL   Globulin 2.2 1.9 -  3.7 g/dL (calc)   AG Ratio 2.0 1.0 - 2.5 (calc)   Total Bilirubin 0.7 0.2 - 1.2 mg/dL   Alkaline phosphatase (APISO) 34 (L) 37 - 153 U/L   AST 16 10 - 35 U/L   ALT 13 6 - 29 U/L      Assessment & Plan:   Problem List Items Addressed This Visit     Essential hypertension   HLD (hyperlipidemia)   Pre-diabetes   Scoliosis   Other Visit Diagnoses     Annual physical exam    -  Primary   Need for influenza vaccination       Relevant Orders   Flu Vaccine Trivalent High Dose (Fluad)       Updated Health Maintenance information Reviewed recent lab results with patient Encouraged improvement to lifestyle with diet and exercise  Assessment and Plan    Prediabetes Gradual increase in HbA1c over the past 2-3 years, currently at 6.2. Discussed the importance of limiting sugar, carbs, and starches. -Continue lifestyle modifications, particularly limiting sugar, carbs, and starches.  Hyperlipidemia Well controlled on Pravastatin 20mg  daily. -Continue Pravastatin 20mg  daily.  Mild Anemia Stable low iron levels and blood counts over the past 2-3 years. Patient is currently taking iron supplements three times a week. -Continue iron supplements three times a week. -Monitor for new changes to fatigue and other symptoms related to anemia. -also has mild low WBC, PLT, will suggest Hematologist specialist next if persistent concern on repeat lab or if new or worsening  symptoms.  Scoliosis Chronic back pain managed with occasional Tylenol and chiropractic visits. No current need for stronger medications. -Continue current pain management strategy. -Consider stronger medications if pain or limitation becomes too bothersome.  General Health Maintenance -Administer influenza vaccine today. -Continue to consider Shingles and COVID-19 vaccines.  Follow-up in 1 year or sooner if there are new changes to fatigue or other symptoms related to anemia.        No orders of the defined types were placed in this encounter.     Follow up plan: Return in about 1 year (around 06/03/2024) for 1 year fasting lab only then 1 week later Annual Physical.  Future labs 06/03/24  Saralyn Pilar, DO [facility] 06/04/2023, 9:18 AM

## 2023-06-04 NOTE — Patient Instructions (Addendum)
Thank you for coming to the office today.  Lab results look good overall.  Cholesterol is well controlled  Recent Labs    05/28/23 0758  HGBA1C 6.2*   Mild elevated sugar. Goal to limit carb starches sweets.  Blood pressure controlled  Keep on Tylenol as needed for back pain scoliosis. You can take 2 Tylenol up to 3 times a day if needed. Or contact me in future for other medicine options for back pain.  The Blood counts are a bit lower, but they are similar to the past 2 years, keep taking iron, and if you notice a lot of new changes to fatigue and drained symptoms, we can follow up sooner and repeat the blood panel, may consider Hematologist.   DUE for FASTING BLOOD WORK (no food or drink after midnight before the lab appointment, only water or coffee without cream/sugar on the morning of)  SCHEDULE "Lab Only" visit in the morning at the clinic for lab draw in 1 YEAR  - Make sure Lab Only appointment is at about 1 week before your next appointment, so that results will be available  For Lab Results, once available within 2-3 days of blood draw, you can can log in to MyChart online to view your results and a brief explanation. Also, we can discuss results at next follow-up visit.   Please schedule a Follow-up Appointment to: Return in about 1 year (around 06/03/2024) for 1 year fasting lab only then 1 week later Annual Physical.  If you have any other questions or concerns, please feel free to call the office or send a message through MyChart. You may also schedule an earlier appointment if necessary.  Additionally, you may be receiving a survey about your experience at our office within a few days to 1 week by e-mail or mail. We value your feedback.  Saralyn Pilar, DO [facility], [place]

## 2023-06-05 DIAGNOSIS — C4491 Basal cell carcinoma of skin, unspecified: Secondary | ICD-10-CM | POA: Diagnosis not present

## 2023-06-05 DIAGNOSIS — C44319 Basal cell carcinoma of skin of other parts of face: Secondary | ICD-10-CM | POA: Diagnosis not present

## 2023-06-07 DIAGNOSIS — M9902 Segmental and somatic dysfunction of thoracic region: Secondary | ICD-10-CM | POA: Diagnosis not present

## 2023-06-07 DIAGNOSIS — M62838 Other muscle spasm: Secondary | ICD-10-CM | POA: Diagnosis not present

## 2023-06-07 DIAGNOSIS — M4134 Thoracogenic scoliosis, thoracic region: Secondary | ICD-10-CM | POA: Diagnosis not present

## 2023-06-07 DIAGNOSIS — M9903 Segmental and somatic dysfunction of lumbar region: Secondary | ICD-10-CM | POA: Diagnosis not present

## 2023-06-07 DIAGNOSIS — M9901 Segmental and somatic dysfunction of cervical region: Secondary | ICD-10-CM | POA: Diagnosis not present

## 2023-06-11 DIAGNOSIS — M9902 Segmental and somatic dysfunction of thoracic region: Secondary | ICD-10-CM | POA: Diagnosis not present

## 2023-06-11 DIAGNOSIS — M62838 Other muscle spasm: Secondary | ICD-10-CM | POA: Diagnosis not present

## 2023-06-11 DIAGNOSIS — M9901 Segmental and somatic dysfunction of cervical region: Secondary | ICD-10-CM | POA: Diagnosis not present

## 2023-06-11 DIAGNOSIS — M4134 Thoracogenic scoliosis, thoracic region: Secondary | ICD-10-CM | POA: Diagnosis not present

## 2023-06-11 DIAGNOSIS — M9903 Segmental and somatic dysfunction of lumbar region: Secondary | ICD-10-CM | POA: Diagnosis not present

## 2023-06-14 DIAGNOSIS — M62838 Other muscle spasm: Secondary | ICD-10-CM | POA: Diagnosis not present

## 2023-06-14 DIAGNOSIS — M4134 Thoracogenic scoliosis, thoracic region: Secondary | ICD-10-CM | POA: Diagnosis not present

## 2023-06-14 DIAGNOSIS — M9903 Segmental and somatic dysfunction of lumbar region: Secondary | ICD-10-CM | POA: Diagnosis not present

## 2023-06-14 DIAGNOSIS — M9901 Segmental and somatic dysfunction of cervical region: Secondary | ICD-10-CM | POA: Diagnosis not present

## 2023-06-14 DIAGNOSIS — R942 Abnormal results of pulmonary function studies: Secondary | ICD-10-CM | POA: Diagnosis not present

## 2023-06-14 DIAGNOSIS — M41116 Juvenile idiopathic scoliosis, lumbar region: Secondary | ICD-10-CM | POA: Diagnosis not present

## 2023-06-14 DIAGNOSIS — R918 Other nonspecific abnormal finding of lung field: Secondary | ICD-10-CM | POA: Diagnosis not present

## 2023-06-14 DIAGNOSIS — M9902 Segmental and somatic dysfunction of thoracic region: Secondary | ICD-10-CM | POA: Diagnosis not present

## 2023-06-14 DIAGNOSIS — R0609 Other forms of dyspnea: Secondary | ICD-10-CM | POA: Diagnosis not present

## 2023-06-18 DIAGNOSIS — M4134 Thoracogenic scoliosis, thoracic region: Secondary | ICD-10-CM | POA: Diagnosis not present

## 2023-06-18 DIAGNOSIS — M9903 Segmental and somatic dysfunction of lumbar region: Secondary | ICD-10-CM | POA: Diagnosis not present

## 2023-06-18 DIAGNOSIS — M9902 Segmental and somatic dysfunction of thoracic region: Secondary | ICD-10-CM | POA: Diagnosis not present

## 2023-06-18 DIAGNOSIS — M9901 Segmental and somatic dysfunction of cervical region: Secondary | ICD-10-CM | POA: Diagnosis not present

## 2023-06-18 DIAGNOSIS — M62838 Other muscle spasm: Secondary | ICD-10-CM | POA: Diagnosis not present

## 2023-06-21 DIAGNOSIS — M4134 Thoracogenic scoliosis, thoracic region: Secondary | ICD-10-CM | POA: Diagnosis not present

## 2023-06-21 DIAGNOSIS — J45909 Unspecified asthma, uncomplicated: Secondary | ICD-10-CM | POA: Diagnosis not present

## 2023-06-21 DIAGNOSIS — J984 Other disorders of lung: Secondary | ICD-10-CM | POA: Diagnosis not present

## 2023-06-21 DIAGNOSIS — M9902 Segmental and somatic dysfunction of thoracic region: Secondary | ICD-10-CM | POA: Diagnosis not present

## 2023-06-21 DIAGNOSIS — M9903 Segmental and somatic dysfunction of lumbar region: Secondary | ICD-10-CM | POA: Diagnosis not present

## 2023-06-21 DIAGNOSIS — M62838 Other muscle spasm: Secondary | ICD-10-CM | POA: Diagnosis not present

## 2023-06-21 DIAGNOSIS — M419 Scoliosis, unspecified: Secondary | ICD-10-CM | POA: Diagnosis not present

## 2023-06-21 DIAGNOSIS — Q763 Congenital scoliosis due to congenital bony malformation: Secondary | ICD-10-CM | POA: Diagnosis not present

## 2023-06-21 DIAGNOSIS — E782 Mixed hyperlipidemia: Secondary | ICD-10-CM | POA: Diagnosis not present

## 2023-06-21 DIAGNOSIS — M9901 Segmental and somatic dysfunction of cervical region: Secondary | ICD-10-CM | POA: Diagnosis not present

## 2023-06-21 DIAGNOSIS — D72819 Decreased white blood cell count, unspecified: Secondary | ICD-10-CM | POA: Diagnosis not present

## 2023-06-21 DIAGNOSIS — I2089 Other forms of angina pectoris: Secondary | ICD-10-CM | POA: Diagnosis not present

## 2023-06-21 DIAGNOSIS — I10 Essential (primary) hypertension: Secondary | ICD-10-CM | POA: Diagnosis not present

## 2023-06-21 DIAGNOSIS — M25473 Effusion, unspecified ankle: Secondary | ICD-10-CM | POA: Diagnosis not present

## 2023-06-21 DIAGNOSIS — M4104 Infantile idiopathic scoliosis, thoracic region: Secondary | ICD-10-CM | POA: Diagnosis not present

## 2023-06-25 DIAGNOSIS — Q763 Congenital scoliosis due to congenital bony malformation: Secondary | ICD-10-CM | POA: Diagnosis not present

## 2023-06-25 DIAGNOSIS — M4134 Thoracogenic scoliosis, thoracic region: Secondary | ICD-10-CM | POA: Diagnosis not present

## 2023-06-25 DIAGNOSIS — M9902 Segmental and somatic dysfunction of thoracic region: Secondary | ICD-10-CM | POA: Diagnosis not present

## 2023-06-25 DIAGNOSIS — M9903 Segmental and somatic dysfunction of lumbar region: Secondary | ICD-10-CM | POA: Diagnosis not present

## 2023-06-25 DIAGNOSIS — M9901 Segmental and somatic dysfunction of cervical region: Secondary | ICD-10-CM | POA: Diagnosis not present

## 2023-06-28 DIAGNOSIS — M9903 Segmental and somatic dysfunction of lumbar region: Secondary | ICD-10-CM | POA: Diagnosis not present

## 2023-06-28 DIAGNOSIS — M4134 Thoracogenic scoliosis, thoracic region: Secondary | ICD-10-CM | POA: Diagnosis not present

## 2023-06-28 DIAGNOSIS — M9902 Segmental and somatic dysfunction of thoracic region: Secondary | ICD-10-CM | POA: Diagnosis not present

## 2023-06-28 DIAGNOSIS — M9901 Segmental and somatic dysfunction of cervical region: Secondary | ICD-10-CM | POA: Diagnosis not present

## 2023-06-28 DIAGNOSIS — Q763 Congenital scoliosis due to congenital bony malformation: Secondary | ICD-10-CM | POA: Diagnosis not present

## 2023-07-02 DIAGNOSIS — Q763 Congenital scoliosis due to congenital bony malformation: Secondary | ICD-10-CM | POA: Diagnosis not present

## 2023-07-02 DIAGNOSIS — M9901 Segmental and somatic dysfunction of cervical region: Secondary | ICD-10-CM | POA: Diagnosis not present

## 2023-07-02 DIAGNOSIS — M9903 Segmental and somatic dysfunction of lumbar region: Secondary | ICD-10-CM | POA: Diagnosis not present

## 2023-07-02 DIAGNOSIS — M4134 Thoracogenic scoliosis, thoracic region: Secondary | ICD-10-CM | POA: Diagnosis not present

## 2023-07-02 DIAGNOSIS — M9902 Segmental and somatic dysfunction of thoracic region: Secondary | ICD-10-CM | POA: Diagnosis not present

## 2023-07-05 DIAGNOSIS — M9902 Segmental and somatic dysfunction of thoracic region: Secondary | ICD-10-CM | POA: Diagnosis not present

## 2023-07-05 DIAGNOSIS — M9901 Segmental and somatic dysfunction of cervical region: Secondary | ICD-10-CM | POA: Diagnosis not present

## 2023-07-05 DIAGNOSIS — Q763 Congenital scoliosis due to congenital bony malformation: Secondary | ICD-10-CM | POA: Diagnosis not present

## 2023-07-05 DIAGNOSIS — M9903 Segmental and somatic dysfunction of lumbar region: Secondary | ICD-10-CM | POA: Diagnosis not present

## 2023-07-05 DIAGNOSIS — M4134 Thoracogenic scoliosis, thoracic region: Secondary | ICD-10-CM | POA: Diagnosis not present

## 2023-07-09 DIAGNOSIS — M9902 Segmental and somatic dysfunction of thoracic region: Secondary | ICD-10-CM | POA: Diagnosis not present

## 2023-07-09 DIAGNOSIS — M9901 Segmental and somatic dysfunction of cervical region: Secondary | ICD-10-CM | POA: Diagnosis not present

## 2023-07-09 DIAGNOSIS — M4134 Thoracogenic scoliosis, thoracic region: Secondary | ICD-10-CM | POA: Diagnosis not present

## 2023-07-09 DIAGNOSIS — M9903 Segmental and somatic dysfunction of lumbar region: Secondary | ICD-10-CM | POA: Diagnosis not present

## 2023-07-09 DIAGNOSIS — Q763 Congenital scoliosis due to congenital bony malformation: Secondary | ICD-10-CM | POA: Diagnosis not present

## 2023-07-10 ENCOUNTER — Other Ambulatory Visit: Payer: Self-pay | Admitting: Family Medicine

## 2023-07-10 DIAGNOSIS — I10 Essential (primary) hypertension: Secondary | ICD-10-CM

## 2023-07-11 NOTE — Telephone Encounter (Signed)
Requested Prescriptions  Pending Prescriptions Disp Refills   losartan-hydrochlorothiazide (HYZAAR) 100-25 MG tablet [Pharmacy Med Name: LOSARTAN/HCTZ 100/25MG  TABLETS] 90 tablet 0    Sig: TAKE 1 TABLET BY MOUTH DAILY     Cardiovascular: ARB + Diuretic Combos Passed - 07/10/2023 11:25 AM      Passed - K in normal range and within 180 days    Potassium  Date Value Ref Range Status  05/28/2023 3.9 3.5 - 5.3 mmol/L Final  09/21/2014 4.1 3.5 - 5.1 mmol/L Final         Passed - Na in normal range and within 180 days    Sodium  Date Value Ref Range Status  05/28/2023 140 135 - 146 mmol/L Final  02/08/2016 142 134 - 144 mmol/L Final         Passed - Cr in normal range and within 180 days    Creat  Date Value Ref Range Status  05/28/2023 0.80 0.60 - 0.95 mg/dL Final         Passed - eGFR is 10 or above and within 180 days    GFR, Est African American  Date Value Ref Range Status  05/10/2020 98 > OR = 60 mL/min/1.2m2 Final   GFR, Est Non African American  Date Value Ref Range Status  05/10/2020 85 > OR = 60 mL/min/1.26m2 Final   eGFR  Date Value Ref Range Status  05/28/2023 74 > OR = 60 mL/min/1.2m2 Final         Passed - Patient is not pregnant      Passed - Last BP in normal range    BP Readings from Last 1 Encounters:  06/04/23 122/64         Passed - Valid encounter within last 6 months    Recent Outpatient Visits           1 month ago Annual physical exam   [facility] [facility] Smitty Cords, DO   6 months ago Acute left ankle pain   [facility] [place], Salvadore Oxford, NP   1 year ago Acute cystitis with hematuria   [place] Smitty Cords, DO   1 year ago Annual physical exam   [facility] Smitty Cords, DO   1 year ago Acute left-sided low back pain with left-sided sciatica   [place] [facility] [place] Althea Charon,  Netta Neat, DO       Future Appointments             In 11 months Althea Charon, Netta Neat, DO [place] [facility], [facility]

## 2023-07-12 ENCOUNTER — Telehealth: Payer: Self-pay | Admitting: Family Medicine

## 2023-07-12 DIAGNOSIS — M4134 Thoracogenic scoliosis, thoracic region: Secondary | ICD-10-CM | POA: Diagnosis not present

## 2023-07-12 DIAGNOSIS — M9901 Segmental and somatic dysfunction of cervical region: Secondary | ICD-10-CM | POA: Diagnosis not present

## 2023-07-12 DIAGNOSIS — Q763 Congenital scoliosis due to congenital bony malformation: Secondary | ICD-10-CM | POA: Diagnosis not present

## 2023-07-12 DIAGNOSIS — M9902 Segmental and somatic dysfunction of thoracic region: Secondary | ICD-10-CM | POA: Diagnosis not present

## 2023-07-12 DIAGNOSIS — I10 Essential (primary) hypertension: Secondary | ICD-10-CM

## 2023-07-12 DIAGNOSIS — M9903 Segmental and somatic dysfunction of lumbar region: Secondary | ICD-10-CM | POA: Diagnosis not present

## 2023-07-12 MED ORDER — LOSARTAN POTASSIUM-HCTZ 100-25 MG PO TABS: 1.0000 | ORAL_TABLET | Freq: Every day | ORAL | 0 refills | Status: DC

## 2023-07-12 NOTE — Telephone Encounter (Signed)
Pt  stopped by the office said the she went to pick up her medication losartan 100-25 MG  she was told that the medication was not approved  by  the  office not sure if  medication  need  PA.  PT  call back  # is   [phone]

## 2023-07-12 NOTE — Telephone Encounter (Signed)
I called [facility] and pharmacy tech Marena Chancy states they have not received the electronic prescription for Losartan - hydrochlorothiazide. She said she could transfer me to pharmacist to take a verbal.  Pharmacist would not take verbal because [facility] doesn't have OUR office number listed properly.   Will resend prescription electronically and notify patient.

## 2023-07-12 NOTE — Addendum Note (Signed)
Addended by: Shirley Muscat on: 07/12/2023 11:25 AM   Modules accepted: Orders

## 2023-07-16 DIAGNOSIS — M9902 Segmental and somatic dysfunction of thoracic region: Secondary | ICD-10-CM | POA: Diagnosis not present

## 2023-07-16 DIAGNOSIS — M4134 Thoracogenic scoliosis, thoracic region: Secondary | ICD-10-CM | POA: Diagnosis not present

## 2023-07-16 DIAGNOSIS — M9903 Segmental and somatic dysfunction of lumbar region: Secondary | ICD-10-CM | POA: Diagnosis not present

## 2023-07-16 DIAGNOSIS — M9901 Segmental and somatic dysfunction of cervical region: Secondary | ICD-10-CM | POA: Diagnosis not present

## 2023-07-16 DIAGNOSIS — Q763 Congenital scoliosis due to congenital bony malformation: Secondary | ICD-10-CM | POA: Diagnosis not present

## 2023-07-19 DIAGNOSIS — Q763 Congenital scoliosis due to congenital bony malformation: Secondary | ICD-10-CM | POA: Diagnosis not present

## 2023-07-19 DIAGNOSIS — M9903 Segmental and somatic dysfunction of lumbar region: Secondary | ICD-10-CM | POA: Diagnosis not present

## 2023-07-19 DIAGNOSIS — M4134 Thoracogenic scoliosis, thoracic region: Secondary | ICD-10-CM | POA: Diagnosis not present

## 2023-07-19 DIAGNOSIS — M9902 Segmental and somatic dysfunction of thoracic region: Secondary | ICD-10-CM | POA: Diagnosis not present

## 2023-07-19 DIAGNOSIS — M9901 Segmental and somatic dysfunction of cervical region: Secondary | ICD-10-CM | POA: Diagnosis not present

## 2023-07-23 DIAGNOSIS — M62838 Other muscle spasm: Secondary | ICD-10-CM | POA: Diagnosis not present

## 2023-07-23 DIAGNOSIS — M9903 Segmental and somatic dysfunction of lumbar region: Secondary | ICD-10-CM | POA: Diagnosis not present

## 2023-07-23 DIAGNOSIS — M9901 Segmental and somatic dysfunction of cervical region: Secondary | ICD-10-CM | POA: Diagnosis not present

## 2023-07-23 DIAGNOSIS — M4134 Thoracogenic scoliosis, thoracic region: Secondary | ICD-10-CM | POA: Diagnosis not present

## 2023-07-23 DIAGNOSIS — Q763 Congenital scoliosis due to congenital bony malformation: Secondary | ICD-10-CM | POA: Diagnosis not present

## 2023-07-23 DIAGNOSIS — M9902 Segmental and somatic dysfunction of thoracic region: Secondary | ICD-10-CM | POA: Diagnosis not present

## 2023-07-26 DIAGNOSIS — M9903 Segmental and somatic dysfunction of lumbar region: Secondary | ICD-10-CM | POA: Diagnosis not present

## 2023-07-26 DIAGNOSIS — M62838 Other muscle spasm: Secondary | ICD-10-CM | POA: Diagnosis not present

## 2023-07-26 DIAGNOSIS — M9902 Segmental and somatic dysfunction of thoracic region: Secondary | ICD-10-CM | POA: Diagnosis not present

## 2023-07-26 DIAGNOSIS — M4134 Thoracogenic scoliosis, thoracic region: Secondary | ICD-10-CM | POA: Diagnosis not present

## 2023-07-26 DIAGNOSIS — M9901 Segmental and somatic dysfunction of cervical region: Secondary | ICD-10-CM | POA: Diagnosis not present

## 2023-07-30 DIAGNOSIS — M62838 Other muscle spasm: Secondary | ICD-10-CM | POA: Diagnosis not present

## 2023-07-30 DIAGNOSIS — M9902 Segmental and somatic dysfunction of thoracic region: Secondary | ICD-10-CM | POA: Diagnosis not present

## 2023-07-30 DIAGNOSIS — M9903 Segmental and somatic dysfunction of lumbar region: Secondary | ICD-10-CM | POA: Diagnosis not present

## 2023-07-30 DIAGNOSIS — M9901 Segmental and somatic dysfunction of cervical region: Secondary | ICD-10-CM | POA: Diagnosis not present

## 2023-07-30 DIAGNOSIS — M4134 Thoracogenic scoliosis, thoracic region: Secondary | ICD-10-CM | POA: Diagnosis not present

## 2023-08-02 DIAGNOSIS — M4134 Thoracogenic scoliosis, thoracic region: Secondary | ICD-10-CM | POA: Diagnosis not present

## 2023-08-02 DIAGNOSIS — M62838 Other muscle spasm: Secondary | ICD-10-CM | POA: Diagnosis not present

## 2023-08-02 DIAGNOSIS — M9902 Segmental and somatic dysfunction of thoracic region: Secondary | ICD-10-CM | POA: Diagnosis not present

## 2023-08-02 DIAGNOSIS — M9901 Segmental and somatic dysfunction of cervical region: Secondary | ICD-10-CM | POA: Diagnosis not present

## 2023-08-02 DIAGNOSIS — M9903 Segmental and somatic dysfunction of lumbar region: Secondary | ICD-10-CM | POA: Diagnosis not present

## 2023-08-06 DIAGNOSIS — M9901 Segmental and somatic dysfunction of cervical region: Secondary | ICD-10-CM | POA: Diagnosis not present

## 2023-08-06 DIAGNOSIS — M9902 Segmental and somatic dysfunction of thoracic region: Secondary | ICD-10-CM | POA: Diagnosis not present

## 2023-08-06 DIAGNOSIS — M62838 Other muscle spasm: Secondary | ICD-10-CM | POA: Diagnosis not present

## 2023-08-06 DIAGNOSIS — M9903 Segmental and somatic dysfunction of lumbar region: Secondary | ICD-10-CM | POA: Diagnosis not present

## 2023-08-06 DIAGNOSIS — M4134 Thoracogenic scoliosis, thoracic region: Secondary | ICD-10-CM | POA: Diagnosis not present

## 2023-08-09 DIAGNOSIS — M9901 Segmental and somatic dysfunction of cervical region: Secondary | ICD-10-CM | POA: Diagnosis not present

## 2023-08-09 DIAGNOSIS — M9903 Segmental and somatic dysfunction of lumbar region: Secondary | ICD-10-CM | POA: Diagnosis not present

## 2023-08-09 DIAGNOSIS — M62838 Other muscle spasm: Secondary | ICD-10-CM | POA: Diagnosis not present

## 2023-08-09 DIAGNOSIS — M9902 Segmental and somatic dysfunction of thoracic region: Secondary | ICD-10-CM | POA: Diagnosis not present

## 2023-08-09 DIAGNOSIS — M4134 Thoracogenic scoliosis, thoracic region: Secondary | ICD-10-CM | POA: Diagnosis not present

## 2023-08-13 DIAGNOSIS — M62838 Other muscle spasm: Secondary | ICD-10-CM | POA: Diagnosis not present

## 2023-08-13 DIAGNOSIS — M4134 Thoracogenic scoliosis, thoracic region: Secondary | ICD-10-CM | POA: Diagnosis not present

## 2023-08-13 DIAGNOSIS — M9903 Segmental and somatic dysfunction of lumbar region: Secondary | ICD-10-CM | POA: Diagnosis not present

## 2023-08-13 DIAGNOSIS — M9902 Segmental and somatic dysfunction of thoracic region: Secondary | ICD-10-CM | POA: Diagnosis not present

## 2023-08-13 DIAGNOSIS — M9901 Segmental and somatic dysfunction of cervical region: Secondary | ICD-10-CM | POA: Diagnosis not present

## 2023-08-20 ENCOUNTER — Other Ambulatory Visit: Payer: Self-pay

## 2023-08-20 DIAGNOSIS — E782 Mixed hyperlipidemia: Secondary | ICD-10-CM

## 2023-08-20 DIAGNOSIS — Q763 Congenital scoliosis due to congenital bony malformation: Secondary | ICD-10-CM | POA: Diagnosis not present

## 2023-08-20 DIAGNOSIS — M9901 Segmental and somatic dysfunction of cervical region: Secondary | ICD-10-CM | POA: Diagnosis not present

## 2023-08-20 DIAGNOSIS — M62838 Other muscle spasm: Secondary | ICD-10-CM | POA: Diagnosis not present

## 2023-08-20 DIAGNOSIS — M4134 Thoracogenic scoliosis, thoracic region: Secondary | ICD-10-CM | POA: Diagnosis not present

## 2023-08-20 DIAGNOSIS — I10 Essential (primary) hypertension: Secondary | ICD-10-CM

## 2023-08-20 DIAGNOSIS — M9902 Segmental and somatic dysfunction of thoracic region: Secondary | ICD-10-CM | POA: Diagnosis not present

## 2023-08-20 MED ORDER — AMLODIPINE BESYLATE 5 MG PO TABS: 5.0000 mg | ORAL_TABLET | Freq: Every day | ORAL | 0 refills | Status: DC

## 2023-08-20 MED ORDER — METOPROLOL TARTRATE 25 MG PO TABS: 25.0000 mg | ORAL_TABLET | Freq: Two times a day (BID) | ORAL | 0 refills | Status: DC

## 2023-08-20 MED ORDER — PRAVASTATIN SODIUM 20 MG PO TABS: 20.0000 mg | ORAL_TABLET | Freq: Every day | ORAL | 0 refills | Status: DC

## 2023-08-27 DIAGNOSIS — Q763 Congenital scoliosis due to congenital bony malformation: Secondary | ICD-10-CM | POA: Diagnosis not present

## 2023-08-27 DIAGNOSIS — M4134 Thoracogenic scoliosis, thoracic region: Secondary | ICD-10-CM | POA: Diagnosis not present

## 2023-08-27 DIAGNOSIS — M9902 Segmental and somatic dysfunction of thoracic region: Secondary | ICD-10-CM | POA: Diagnosis not present

## 2023-08-27 DIAGNOSIS — M9901 Segmental and somatic dysfunction of cervical region: Secondary | ICD-10-CM | POA: Diagnosis not present

## 2023-08-27 DIAGNOSIS — M62838 Other muscle spasm: Secondary | ICD-10-CM | POA: Diagnosis not present

## 2023-09-03 DIAGNOSIS — M62838 Other muscle spasm: Secondary | ICD-10-CM | POA: Diagnosis not present

## 2023-09-03 DIAGNOSIS — M4134 Thoracogenic scoliosis, thoracic region: Secondary | ICD-10-CM | POA: Diagnosis not present

## 2023-09-03 DIAGNOSIS — M9902 Segmental and somatic dysfunction of thoracic region: Secondary | ICD-10-CM | POA: Diagnosis not present

## 2023-09-03 DIAGNOSIS — M9901 Segmental and somatic dysfunction of cervical region: Secondary | ICD-10-CM | POA: Diagnosis not present

## 2023-09-03 DIAGNOSIS — Q763 Congenital scoliosis due to congenital bony malformation: Secondary | ICD-10-CM | POA: Diagnosis not present

## 2023-09-06 ENCOUNTER — Other Ambulatory Visit: Payer: Self-pay | Admitting: General Surgery

## 2023-09-06 DIAGNOSIS — Z1231 Encounter for screening mammogram for malignant neoplasm of breast: Secondary | ICD-10-CM

## 2023-09-10 DIAGNOSIS — M62838 Other muscle spasm: Secondary | ICD-10-CM | POA: Diagnosis not present

## 2023-09-10 DIAGNOSIS — M9901 Segmental and somatic dysfunction of cervical region: Secondary | ICD-10-CM | POA: Diagnosis not present

## 2023-09-10 DIAGNOSIS — M9902 Segmental and somatic dysfunction of thoracic region: Secondary | ICD-10-CM | POA: Diagnosis not present

## 2023-09-10 DIAGNOSIS — Q763 Congenital scoliosis due to congenital bony malformation: Secondary | ICD-10-CM | POA: Diagnosis not present

## 2023-09-10 DIAGNOSIS — M4134 Thoracogenic scoliosis, thoracic region: Secondary | ICD-10-CM | POA: Diagnosis not present

## 2023-09-17 DIAGNOSIS — M4134 Thoracogenic scoliosis, thoracic region: Secondary | ICD-10-CM | POA: Diagnosis not present

## 2023-09-17 DIAGNOSIS — M62838 Other muscle spasm: Secondary | ICD-10-CM | POA: Diagnosis not present

## 2023-09-17 DIAGNOSIS — Q763 Congenital scoliosis due to congenital bony malformation: Secondary | ICD-10-CM | POA: Diagnosis not present

## 2023-09-17 DIAGNOSIS — M9901 Segmental and somatic dysfunction of cervical region: Secondary | ICD-10-CM | POA: Diagnosis not present

## 2023-09-17 DIAGNOSIS — M9902 Segmental and somatic dysfunction of thoracic region: Secondary | ICD-10-CM | POA: Diagnosis not present

## 2023-10-01 DIAGNOSIS — M9902 Segmental and somatic dysfunction of thoracic region: Secondary | ICD-10-CM | POA: Diagnosis not present

## 2023-10-01 DIAGNOSIS — M62838 Other muscle spasm: Secondary | ICD-10-CM | POA: Diagnosis not present

## 2023-10-01 DIAGNOSIS — M9903 Segmental and somatic dysfunction of lumbar region: Secondary | ICD-10-CM | POA: Diagnosis not present

## 2023-10-01 DIAGNOSIS — M9901 Segmental and somatic dysfunction of cervical region: Secondary | ICD-10-CM | POA: Diagnosis not present

## 2023-10-01 DIAGNOSIS — Q763 Congenital scoliosis due to congenital bony malformation: Secondary | ICD-10-CM | POA: Diagnosis not present

## 2023-10-01 DIAGNOSIS — M4134 Thoracogenic scoliosis, thoracic region: Secondary | ICD-10-CM | POA: Diagnosis not present

## 2023-10-08 DIAGNOSIS — M4134 Thoracogenic scoliosis, thoracic region: Secondary | ICD-10-CM | POA: Diagnosis not present

## 2023-10-08 DIAGNOSIS — M9902 Segmental and somatic dysfunction of thoracic region: Secondary | ICD-10-CM | POA: Diagnosis not present

## 2023-10-08 DIAGNOSIS — M62838 Other muscle spasm: Secondary | ICD-10-CM | POA: Diagnosis not present

## 2023-10-08 DIAGNOSIS — M9903 Segmental and somatic dysfunction of lumbar region: Secondary | ICD-10-CM | POA: Diagnosis not present

## 2023-10-08 DIAGNOSIS — M9901 Segmental and somatic dysfunction of cervical region: Secondary | ICD-10-CM | POA: Diagnosis not present

## 2023-10-11 ENCOUNTER — Other Ambulatory Visit: Payer: Self-pay | Admitting: Family Medicine

## 2023-10-11 DIAGNOSIS — I10 Essential (primary) hypertension: Secondary | ICD-10-CM

## 2023-10-11 NOTE — Telephone Encounter (Signed)
Prescription Request  10/11/2023  LOV: 06/04/2023  What is the name of the medication or equipment? Losartan   Have you contacted your pharmacy to request a refill? Yes   Which pharmacy would you like this sent to?  [facility] - [place], [place] - [address] [facility] [address] [place] [address] [place] [address] Phone: [phone] Fax: [phone]  [id number] [place], [place] - [address] AT [facility] OF [address] [place] [address] [place] [address] Phone: [phone] Fax: [phone]  [facility] - [place], [place] - [address] [place] [address] Phone: [phone] Fax: [phone]    Patient notified that their request is being sent to the clinical staff for review and that they should receive a response within 2 business days.   Please advise at Mobile There is no such number on file (mobile).

## 2023-10-15 DIAGNOSIS — M9903 Segmental and somatic dysfunction of lumbar region: Secondary | ICD-10-CM | POA: Diagnosis not present

## 2023-10-15 DIAGNOSIS — M62838 Other muscle spasm: Secondary | ICD-10-CM | POA: Diagnosis not present

## 2023-10-15 DIAGNOSIS — M9902 Segmental and somatic dysfunction of thoracic region: Secondary | ICD-10-CM | POA: Diagnosis not present

## 2023-10-15 DIAGNOSIS — M4134 Thoracogenic scoliosis, thoracic region: Secondary | ICD-10-CM | POA: Diagnosis not present

## 2023-10-15 DIAGNOSIS — M9901 Segmental and somatic dysfunction of cervical region: Secondary | ICD-10-CM | POA: Diagnosis not present

## 2023-10-27 DIAGNOSIS — D539 Nutritional anemia, unspecified: Secondary | ICD-10-CM | POA: Diagnosis not present

## 2023-10-28 ENCOUNTER — Other Ambulatory Visit: Payer: Self-pay | Admitting: Family Medicine

## 2023-10-28 DIAGNOSIS — E782 Mixed hyperlipidemia: Secondary | ICD-10-CM

## 2023-10-28 DIAGNOSIS — I10 Essential (primary) hypertension: Secondary | ICD-10-CM

## 2023-10-29 NOTE — Telephone Encounter (Signed)
 Requested Prescriptions  Pending Prescriptions Disp Refills   metoprolol  tartrate (LOPRESSOR ) 25 MG tablet [Pharmacy Med Name: Metoprolol  Tartrate 25 MG Oral Tablet] 200 tablet 1    Sig: TAKE 1 TABLET BY MOUTH TWICE  DAILY     Cardiovascular:  Beta Blockers Passed - 10/29/2023  2:53 PM      Passed - Last BP in normal range    BP Readings from Last 1 Encounters:  06/04/23 122/64         Passed - Last Heart Rate in normal range    Pulse Readings from Last 1 Encounters:  06/04/23 66         Passed - Valid encounter within last 6 months    Recent Outpatient Visits           4 months ago Annual physical exam   [place] [facility] [place], Kayleen Party, DO   10 months ago Acute left ankle pain   [place] [facility] [place], Rankin Buzzard, NP   1 year ago Acute cystitis with hematuria   [place] [facility] [place], Kayleen Party, DO   1 year ago Annual physical exam   [place] [facility] Raina Bunting, DO   1 year ago Acute left-sided low back pain with left-sided sciatica   [place] [facility] [place], Kayleen Party, DO       Future Appointments             In 7 months [place], Kayleen Party, DO [place] [facility]             amLODipine  (NORVASC ) 5 MG tablet [Pharmacy Med Name: amLODIPine  Besylate 5 MG Oral Tablet] 100 tablet 1    Sig: TAKE 1 TABLET BY MOUTH DAILY     Cardiovascular: Calcium Channel Blockers 2 Passed - 10/29/2023  2:53 PM      Passed - Last BP in normal range    BP Readings from Last 1 Encounters:  06/04/23 122/64         Passed - Last Heart Rate in normal range    Pulse Readings from Last 1 Encounters:  06/04/23 66         Passed - Valid encounter within last 6 months    Recent Outpatient Visits           4 months ago Annual physical exam   [place] [facility] [place], Kayleen Party,  DO   10 months ago Acute left ankle pain   [place] [facility] [place], Rankin Buzzard, NP   1 year ago Acute cystitis with hematuria   [place] [facility] Raina Bunting, DO   1 year ago Annual physical exam   [place] [facility] Raina Bunting, DO   1 year ago Acute left-sided low back pain with left-sided sciatica   [place] [facility] [place], Kayleen Party, DO       Future Appointments             In 7 months [place], Kayleen Party, DO [facility]             pravastatin  (PRAVACHOL ) 20 MG tablet [Pharmacy Med Name: Pravastatin  Sodium 20 MG Oral Tablet] 100 tablet 1    Sig: TAKE 1 TABLET BY MOUTH DAILY     Cardiovascular:  Antilipid -  Statins Failed - 10/29/2023  2:53 PM      Failed - Lipid Panel in normal range within the last 12 months    Cholesterol, Total  Date Value Ref Range Status  02/08/2016 165 100 - 199 mg/dL Final   Cholesterol  Date Value Ref Range Status  05/28/2023 165 <200 mg/dL Final   LDL Cholesterol (Calc)  Date Value Ref Range Status  05/28/2023 81 mg/dL (calc) Final    Comment:    Reference range: <100 . Desirable range <100 mg/dL for primary prevention;   <70 mg/dL for patients with CHD or diabetic patients  with > or = 2 CHD risk factors. Aaron Aas LDL-C is now calculated using the Martin-Hopkins  calculation, which is a validated novel method providing  better accuracy than the Friedewald equation in the  estimation of LDL-C.  Melinda Sprawls et al. Erroll Heard. 0347;425(95): 2061-2068  (http://education.[employer].com/faq/FAQ164)    HDL  Date Value Ref Range Status  05/28/2023 68 > OR = 50 mg/dL Final  63/87/5643 79 >32 mg/dL Final   Triglycerides  Date Value Ref Range Status  05/28/2023 78 <150 mg/dL Final         Passed - Patient is not pregnant      Passed - Valid encounter within last 12 months    Recent  Outpatient Visits           4 months ago Annual physical exam   [place] [facility] Raina Bunting, DO   10 months ago Acute left ankle pain   [place] [facility] [place], Rankin Buzzard, NP   1 year ago Acute cystitis with hematuria   [place] [facility] Raina Bunting, DO   1 year ago Annual physical exam   [place] [facility] Raina Bunting, DO   1 year ago Acute left-sided low back pain with left-sided sciatica   [place] [facility] [place], Kayleen Party, DO       Future Appointments             In 7 months [place], Kayleen Party, DO  [facility]

## 2023-11-09 ENCOUNTER — Ambulatory Visit
Admission: RE | Admit: 2023-11-09 | Discharge: 2023-11-09 | Disposition: A | Discharge status: Home / Self Care | Payer: Medicare Other | Source: Ambulatory Visit | Attending: General Surgery | Admitting: General Surgery

## 2023-11-09 DIAGNOSIS — Z1231 Encounter for screening mammogram for malignant neoplasm of breast: Secondary | ICD-10-CM | POA: Insufficient documentation

## 2023-11-15 ENCOUNTER — Inpatient Hospital Stay
Admission: EM | Admit: 2023-11-15 | Discharge: 2023-11-19 | DRG: 804 | Disposition: A | Discharge status: Home / Self Care | Payer: Medicare Other | Attending: Hospitalist | Admitting: Hospitalist

## 2023-11-15 ENCOUNTER — Other Ambulatory Visit: Payer: Self-pay

## 2023-11-15 ENCOUNTER — Encounter: Payer: Self-pay | Admitting: Emergency Medicine

## 2023-11-15 ENCOUNTER — Other Ambulatory Visit: Payer: Self-pay | Admitting: Nurse Practitioner

## 2023-11-15 ENCOUNTER — Ambulatory Visit
Admission: RE | Admit: 2023-11-15 | Discharge: 2023-11-15 | Disposition: A | Discharge status: Home / Self Care | Payer: Medicare Other | Source: Ambulatory Visit | Attending: Nurse Practitioner | Admitting: Nurse Practitioner

## 2023-11-15 DIAGNOSIS — E876 Hypokalemia: Secondary | ICD-10-CM | POA: Diagnosis present

## 2023-11-15 DIAGNOSIS — R7303 Prediabetes: Secondary | ICD-10-CM | POA: Diagnosis present

## 2023-11-15 DIAGNOSIS — R195 Other fecal abnormalities: Secondary | ICD-10-CM | POA: Diagnosis not present

## 2023-11-15 DIAGNOSIS — Z803 Family history of malignant neoplasm of breast: Secondary | ICD-10-CM

## 2023-11-15 DIAGNOSIS — I119 Hypertensive heart disease without heart failure: Secondary | ICD-10-CM | POA: Diagnosis present

## 2023-11-15 DIAGNOSIS — E119 Type 2 diabetes mellitus without complications: Secondary | ICD-10-CM

## 2023-11-15 DIAGNOSIS — I2510 Atherosclerotic heart disease of native coronary artery without angina pectoris: Secondary | ICD-10-CM | POA: Diagnosis not present

## 2023-11-15 DIAGNOSIS — Z923 Personal history of irradiation: Secondary | ICD-10-CM | POA: Diagnosis not present

## 2023-11-15 DIAGNOSIS — R0600 Dyspnea, unspecified: Secondary | ICD-10-CM | POA: Diagnosis not present

## 2023-11-15 DIAGNOSIS — I10 Essential (primary) hypertension: Secondary | ICD-10-CM | POA: Diagnosis present

## 2023-11-15 DIAGNOSIS — K649 Unspecified hemorrhoids: Secondary | ICD-10-CM | POA: Diagnosis not present

## 2023-11-15 DIAGNOSIS — Z8249 Family history of ischemic heart disease and other diseases of the circulatory system: Secondary | ICD-10-CM | POA: Diagnosis not present

## 2023-11-15 DIAGNOSIS — M810 Age-related osteoporosis without current pathological fracture: Secondary | ICD-10-CM | POA: Diagnosis present

## 2023-11-15 DIAGNOSIS — D4621 Refractory anemia with excess of blasts 1: Secondary | ICD-10-CM | POA: Diagnosis not present

## 2023-11-15 DIAGNOSIS — E785 Hyperlipidemia, unspecified: Secondary | ICD-10-CM | POA: Diagnosis not present

## 2023-11-15 DIAGNOSIS — R55 Syncope and collapse: Secondary | ICD-10-CM | POA: Diagnosis not present

## 2023-11-15 DIAGNOSIS — D759 Disease of blood and blood-forming organs, unspecified: Secondary | ICD-10-CM | POA: Diagnosis present

## 2023-11-15 DIAGNOSIS — K922 Gastrointestinal hemorrhage, unspecified: Secondary | ICD-10-CM

## 2023-11-15 DIAGNOSIS — K8020 Calculus of gallbladder without cholecystitis without obstruction: Secondary | ICD-10-CM | POA: Diagnosis not present

## 2023-11-15 DIAGNOSIS — D649 Anemia, unspecified: Principal | ICD-10-CM | POA: Diagnosis present

## 2023-11-15 DIAGNOSIS — Z79899 Other long term (current) drug therapy: Secondary | ICD-10-CM

## 2023-11-15 DIAGNOSIS — Z853 Personal history of malignant neoplasm of breast: Secondary | ICD-10-CM

## 2023-11-15 DIAGNOSIS — D696 Thrombocytopenia, unspecified: Secondary | ICD-10-CM

## 2023-11-15 DIAGNOSIS — R002 Palpitations: Secondary | ICD-10-CM | POA: Diagnosis not present

## 2023-11-15 DIAGNOSIS — M419 Scoliosis, unspecified: Secondary | ICD-10-CM | POA: Diagnosis present

## 2023-11-15 DIAGNOSIS — R918 Other nonspecific abnormal finding of lung field: Secondary | ICD-10-CM | POA: Diagnosis not present

## 2023-11-15 DIAGNOSIS — R0602 Shortness of breath: Secondary | ICD-10-CM | POA: Diagnosis not present

## 2023-11-15 LAB — VITAMIN B12: Vitamin B-12: 457 pg/mL (ref 180–914)

## 2023-11-15 LAB — COMPREHENSIVE METABOLIC PANEL
ALT: 11 U/L (ref 0–44)
AST: 19 U/L (ref 15–41)
Albumin: 3.8 g/dL (ref 3.5–5.0)
Alkaline Phosphatase: 41 U/L (ref 38–126)
Anion gap: 11 (ref 5–15)
BUN: 21 mg/dL (ref 8–23)
CO2: 23 mmol/L (ref 22–32)
Calcium: 9.2 mg/dL (ref 8.9–10.3)
Chloride: 103 mmol/L (ref 98–111)
Creatinine, Ser: 0.93 mg/dL (ref 0.44–1.00)
GFR, Estimated: >60 mL/min (ref >60)
Glucose, Bld: 197 mg/dL — ABNORMAL HIGH (ref 70–99)
Potassium: 3.3 mmol/L — ABNORMAL LOW (ref 3.5–5.1)
Sodium: 137 mmol/L (ref 135–145)
Total Bilirubin: 1.3 mg/dL — ABNORMAL HIGH (ref 0.0–1.2)
Total Protein: 6.6 g/dL (ref 6.5–8.1)

## 2023-11-15 LAB — CBC
HCT: 16.4 % — ABNORMAL LOW (ref 36.0–46.0)
Hemoglobin: 5 g/dL — ABNORMAL LOW (ref 12.0–15.0)
MCH: 36.8 pg — ABNORMAL HIGH (ref 26.0–34.0)
MCHC: 30.5 g/dL (ref 30.0–36.0)
MCV: 120.6 fL — ABNORMAL HIGH (ref 80.0–100.0)
Platelets: 68 10*3/uL — ABNORMAL LOW (ref 150–400)
RBC: 1.36 MIL/uL — ABNORMAL LOW (ref 3.87–5.11)
RDW: 24.5 % — ABNORMAL HIGH (ref 11.5–15.5)
WBC: 6.4 10*3/uL (ref 4.0–10.5)
nRBC: 1.7 % — ABNORMAL HIGH (ref 0.0–0.2)

## 2023-11-15 LAB — IRON AND TIBC
Iron: 160 ug/dL (ref 28–170)
Saturation Ratios: 45 % — ABNORMAL HIGH (ref 10.4–31.8)
TIBC: 357 ug/dL (ref 250–450)
UIBC: 197 ug/dL

## 2023-11-15 LAB — RETIC PANEL
Immature Retic Fract: 24.7 % — ABNORMAL HIGH (ref 2.3–15.9)
RBC.: 1.34 MIL/uL — ABNORMAL LOW (ref 3.87–5.11)
Retic Count, Absolute: 33.1 10*3/uL (ref 19.0–186.0)
Retic Ct Pct: 2.5 % (ref 0.4–3.1)
Reticulocyte Hemoglobin: 32.1 pg (ref >27.9)

## 2023-11-15 LAB — CBC WITH DIFFERENTIAL/PLATELET
Abs Immature Granulocytes: 0.39 10*3/uL — ABNORMAL HIGH (ref 0.00–0.07)
Basophils Absolute: 0 10*3/uL (ref 0.0–0.1)
Basophils Relative: 0 %
Eosinophils Absolute: 0 10*3/uL (ref 0.0–0.5)
Eosinophils Relative: 0 %
HCT: 16.5 % — ABNORMAL LOW (ref 36.0–46.0)
Hemoglobin: 5 g/dL — ABNORMAL LOW (ref 12.0–15.0)
Immature Granulocytes: 6 %
Lymphocytes Relative: 16 %
Lymphs Abs: 1 10*3/uL (ref 0.7–4.0)
MCH: 36.5 pg — ABNORMAL HIGH (ref 26.0–34.0)
MCHC: 30.3 g/dL (ref 30.0–36.0)
MCV: 120.4 fL — ABNORMAL HIGH (ref 80.0–100.0)
Monocytes Absolute: 0.7 10*3/uL (ref 0.1–1.0)
Monocytes Relative: 11 %
Neutro Abs: 4.2 10*3/uL (ref 1.7–7.7)
Neutrophils Relative %: 67 %
Platelets: 68 10*3/uL — ABNORMAL LOW (ref 150–400)
RBC: 1.37 MIL/uL — ABNORMAL LOW (ref 3.87–5.11)
RDW: 24.9 % — ABNORMAL HIGH (ref 11.5–15.5)
Smear Review: DECREASED
WBC Morphology: ABNORMAL
WBC: 6.3 10*3/uL (ref 4.0–10.5)
nRBC: 2.4 % — ABNORMAL HIGH (ref 0.0–0.2)

## 2023-11-15 LAB — FERRITIN: Ferritin: 65 ng/mL (ref 11–307)

## 2023-11-15 LAB — FOLATE: Folate: 13.6 ng/mL (ref >5.9)

## 2023-11-15 LAB — LACTATE DEHYDROGENASE: LDH: 165 U/L (ref 98–192)

## 2023-11-15 LAB — PREPARE RBC (CROSSMATCH)

## 2023-11-15 LAB — TECHNOLOGIST SMEAR REVIEW
Plt Morphology: DECREASED
WBC MORPHOLOGY: ABNORMAL

## 2023-11-15 LAB — GLUCOSE, CAPILLARY: Glucose-Capillary: 119 mg/dL — ABNORMAL HIGH (ref 70–99)

## 2023-11-15 LAB — ABO/RH: ABO/RH(D): O POS

## 2023-11-15 MED ORDER — ONDANSETRON HCL 4 MG PO TABS: 4.0000 mg | ORAL_TABLET | Freq: Four times a day (QID) | ORAL | Status: DC | PRN

## 2023-11-15 MED ORDER — PANTOPRAZOLE SODIUM 40 MG IV SOLR
40.0000 mg | Freq: Two times a day (BID) | INTRAVENOUS | Status: DC
  Administered 2023-11-15 – 2023-11-19 (×8): 40 mg via INTRAVENOUS
  Filled 2023-11-15 (×8): qty 10

## 2023-11-15 MED ORDER — ONDANSETRON HCL 4 MG/2ML IJ SOLN: 4.0000 mg | Freq: Four times a day (QID) | INTRAMUSCULAR | Status: DC | PRN

## 2023-11-15 MED ORDER — IOHEXOL 350 MG/ML SOLN
75.0000 mL | Freq: Once | INTRAVENOUS | Status: AC | PRN
  Administered 2023-11-15: 75 mL via INTRAVENOUS

## 2023-11-15 MED ORDER — INSULIN ASPART 100 UNIT/ML IJ SOLN: 0.0000 [IU] | Freq: Three times a day (TID) | INTRAMUSCULAR | Status: DC

## 2023-11-15 MED ORDER — FUROSEMIDE 10 MG/ML IJ SOLN
20.0000 mg | Freq: Once | INTRAMUSCULAR | Status: AC
  Administered 2023-11-15: 20 mg via INTRAVENOUS
  Filled 2023-11-15: qty 4

## 2023-11-15 MED ORDER — SODIUM CHLORIDE 0.9 % IV SOLN
10.0000 mL/h | Freq: Once | INTRAVENOUS | Status: AC
  Administered 2023-11-15: 10 mL/h via INTRAVENOUS

## 2023-11-15 NOTE — ED Triage Notes (Signed)
 Patient to ED via POV for abnormal lab- hgb 4. Patient had labs and CT completed this AM due to SOB. Sob ongoing for a couple weeks. NAD noted. Patient speaking in full sentences.

## 2023-11-15 NOTE — Assessment & Plan Note (Signed)
 Noted new onset anemia as well as severe thrombocytopenia with?  Occult GI bleeding Transient dark stools at home Hemoccult faintly positive in ER though hemorrhoidal disease may be confounding issue We will trend hemoglobin Consider formal GI consultation Monitor

## 2023-11-15 NOTE — Assessment & Plan Note (Signed)
 BP stable Titrate home regimen

## 2023-11-15 NOTE — Assessment & Plan Note (Signed)
 Blood sugar 180s  SSI  A1C Monitor

## 2023-11-15 NOTE — ED Provider Notes (Signed)
 [facility] Provider Note    Event Date/Time   First MD Initiated Contact with Patient 11/15/23 1529     (approximate)   History   Abnormal Lab   HPI Rebecca Mitchell is a 83 y.o. female senting today for anemia.  Patient states for the past several weeks she has had worsening shortness of breath.  It is now at the point where she can only walk about 20 feet before being too out of breath and having to sit down.  She has been evaluated by her primary care provider and cardiologist outpatient and today was found to have hemoglobin of 5.0.  She otherwise with me denies chest pain, fever, cough, congestion, abdominal pain, nausea, vomiting, hematemesis, melena, hematochezia.  She reports prior history of hemorrhoids but states she has not had an issue with those recently.  She states 40 years ago she had a similar episode with her hemoglobin being low and they were unsure if it was related to GI bleeding but she had a negative EGD and colonoscopy and had no recurrence of symptoms after getting blood transfusion and iron transfusion.     Physical Exam   Triage Vital Signs: ED Triage Vitals [11/15/23 1439]  Encounter Vitals Group     BP (!) 127/101     Systolic BP Percentile      Diastolic BP Percentile      Pulse Rate 89     Resp 18     Temp 98.2 F (36.8 C)     Temp Source Oral     SpO2 98 %     Weight 126 lb (57.2 kg)     Height 5\' 3"  (1.6 m)     Head Circumference      Peak Flow      Pain Score 0     Pain Loc      Pain Education      Exclude from Growth Chart     Most recent vital signs: Vitals:   11/15/23 1439 11/15/23 1612  BP: (!) 127/101   Pulse: 89   Resp: 18   Temp: 98.2 F (36.8 C) 100.2 F (37.9 C)  SpO2: 98%    Physical Exam: I have reviewed the vital signs and nursing notes. General: Awake, alert, no acute distress.  Nontoxic appearing. Head:  Atraumatic, normocephalic.   ENT:  EOM intact, PERRL. Oral mucosa is pink and moist  with no lesions. Neck: Neck is supple with full range of motion, No meningeal signs. Cardiovascular:  RRR, No murmurs. Peripheral pulses palpable and equal bilaterally. Respiratory:  Symmetrical chest wall expansion.  No rhonchi, rales, or wheezes.  Good air movement throughout.  No use of accessory muscles.   Musculoskeletal:  No cyanosis or edema. Moving extremities with full ROM Abdomen:  Soft, nontender, nondistended. Neuro:  GCS 15, moving all four extremities, interacting appropriately. Speech clear. Psych:  Calm, appropriate.   Skin:  Pale Appearing   ED Results / Procedures / Treatments   Labs (all labs ordered are listed, but only abnormal results are displayed) Labs Reviewed  COMPREHENSIVE METABOLIC PANEL - Abnormal; Notable for the following components:      Result Value   Potassium 3.3 (*)    Glucose, Bld 197 (*)    Total Bilirubin 1.3 (*)    All other components within normal limits  CBC - Abnormal; Notable for the following components:   RBC 1.36 (*)    Hemoglobin 5.0 (*)  HCT 16.4 (*)    MCV 120.6 (*)    MCH 36.8 (*)    RDW 24.5 (*)    Platelets 68 (*)    nRBC 1.7 (*)    All other components within normal limits  VITAMIN B12  FOLATE  LACTATE DEHYDROGENASE  HAPTOGLOBIN  FERRITIN  IRON AND TIBC  TYPE AND SCREEN  PREPARE RBC (CROSSMATCH)  ABO/RH     EKG    RADIOLOGY    PROCEDURES:  Critical Care performed: Yes, see critical care procedure note(s)  .Critical Care  Performed by: Janith Lima, MD Authorized by: Janith Lima, MD   Critical care provider statement:    Critical care time (minutes):  30   Critical care was necessary to treat or prevent imminent or life-threatening deterioration of the following conditions: Pancytopenia.   Critical care was time spent personally by me on the following activities:  Development of treatment plan with patient or surrogate, discussions with consultants, evaluation of patient's response to  treatment, examination of patient, ordering and review of laboratory studies, ordering and review of radiographic studies, ordering and performing treatments and interventions, pulse oximetry, re-evaluation of patient's condition and review of old charts   I assumed direction of critical care for this patient from another provider in my specialty: no     Care discussed with: admitting provider      MEDICATIONS ORDERED IN ED: Medications  0.9 %  sodium chloride infusion (has no administration in time range)     IMPRESSION / MDM / ASSESSMENT AND PLAN / ED COURSE  I reviewed the triage vital signs and the nursing notes.                              Differential diagnosis includes, but is not limited to, upper GI bleed, lower GI bleed, pancytopenia, idiopathic pancytopenia  Patient's presentation is most consistent with acute presentation with potential threat to life or bodily function.  Patient is an 83 year old female presenting today symptomatic anemia with shortness of breath.  Hemoglobin today of 5.0 which is a dramatic drop from most recent labs 5 months ago.  Vital signs otherwise stable but patient very pale appearing on exam.  Hemoccult testing is equivocal as there does appear to be some blood in the stool however her stool is brown and not melanotic.  She does have a history of hemorrhoids so this may be complicating that.  Given her 2 areas of cytopenia with hemoglobin and platelets, discussed case with hematology.  They recommended adding on iron studies as well as LDH, haptoglobin.  Will admit patient to hospitalist for further care.  Patient was given 2 units PRBCs in the emergency department.  The patient is on the cardiac monitor to evaluate for evidence of arrhythmia and/or significant heart rate changes. Clinical Course as of 11/15/23 1633  Thu Nov 15, 2023  1538 Hemoglobin(!): 5.0 [DW]  1538 Platelets(!): 68 [DW]  1629 Rectal equivocal. While it is positive for blood in  stool, her stool is not melanotic at all in nature. Will consult hematology and admit patient for further evaluation [DW]    Clinical Course User Index [DW] Janith Lima, MD     FINAL CLINICAL IMPRESSION(S) / ED DIAGNOSES   Final diagnoses:  Symptomatic anemia  Thrombocytopenia (HCC)     Rx / DC Orders   ED Discharge Orders     None  Note:  This document was prepared using Dragon voice recognition software and may include unintentional dictation errors.   Janith Lima, MD 11/15/23 [phone]

## 2023-11-15 NOTE — Assessment & Plan Note (Signed)
 Continue statin.

## 2023-11-15 NOTE — Assessment & Plan Note (Signed)
 Progressive dyspnea x 2-week with noted symptomatic anemia-hemoglobin 5 on presentation CT imaging noted for cardiomegaly as well as?  Pulmonary hypertension Will plan for formal 2D echo to assess Appears fairly euvolemic on presentation Diuresis appropriate Cardiology consultation as appropriate Monitor

## 2023-11-15 NOTE — Assessment & Plan Note (Addendum)
 Thrombocytopenia Progressive shortness of breath x 2 weeks with noted hemoglobin of 5 on presentation Platelet count in 60s ? Occult GI bleed is a confounding issue  Unclear etiology as patient denies any recent infections as well as no active chemotherapy use Per Dr. Anner Crete in the ER, case discussed preliminarily with on-call hematology Plan for formal evaluation including LDH, haptoglobin, reticulocyte count, peripheral smear Pending 2 unit PRBC transfusion Trend hemoglobin overnight Follow-up formal hematology recommendations

## 2023-11-15 NOTE — H&P (Addendum)
 History and Physical    Patient: Rebecca Mitchell:[id number] DOB: [birth year] DOA: 11/15/2023 DOS: the patient was seen and examined on 11/15/2023 PCP: Smitty Cords, DO  Patient coming from: Home  Chief Complaint:  Chief Complaint  Patient presents with   Abnormal Lab   HPI: Rebecca Mitchell is a 83 y.o. female with medical history significant of hypertension, hyperlipidemia presenting with shortness of breath, anemia, thrombocytopenia,?  GI bleeding.  Patient reports approximate 2 weeks of increased work of breathing.  Worsening dyspnea on exertion as well as overall fatigue.  Denies any recent infections.  No recent medication changes.  Minimal to mild orthopnea and PND.  Was initially evaluated by cardiology earlier today out of concern.  Holter monitor placed.  No abdominal pain, nausea or vomiting.  Patient does report remote history of similar episode approximately 30 years ago including evaluation with colonoscopy that was otherwise negative.  Denies any NSAID use. Presented to the ER Tmax 100.2, afebrile, hemodynamically stable.  Satting well on room air.  White count 6.4, hemoglobin 5, platelets 68, creatinine 0.93, glucose 187, T. bili 1.3, AST/ALT within normal limits.  CTA of the chest negative for PE.  Noted small airway disease as well as cardiomegaly and central pulmonary vascular congestion concerning for pulmonary hypertension.  Positive cholelithiasis. Review of Systems: As mentioned in the history of present illness. All other systems reviewed and are negative. Past Medical History:  Diagnosis Date   Anemia    Breast cancer (HCC) 09/2014   left breast lumpectomy with rad tx   Hyperlipidemia    Malignant neoplasm of lower-inner quadrant of left female breast (HCC) 07/2014   5 mm,T1a, N0; ER/ PR positive, HER-2/neu negative   Osteoporosis    Personal history of radiation therapy 2016   left breast ca   Past Surgical History:  Procedure Laterality Date   BREAST  BIOPSY Left 08/2014   invasive mammary carcinoma   BREAST LUMPECTOMY Left 10/01/2014   invasive mammary carcinoma and DCIS with clear margins   BREAST SURGERY Left 10/01/2014   lumpectomy   COLONOSCOPY  2010   COLONOSCOPY WITH PROPOFOL N/A 09/21/2015   Procedure: COLONOSCOPY WITH PROPOFOL;  Surgeon: Kieth Brightly, MD;  Location: [facility] ENDOSCOPY;  Service: Endoscopy;  Laterality: N/A;   UPPER GI ENDOSCOPY  2014   Social History:  reports that she has never smoked. She has never used smokeless tobacco. She reports that she does not drink alcohol and does not use drugs.  No Known Allergies  Family History  Problem Relation Age of Onset   Breast cancer Sister 23   Heart disease Mother    Heart disease Father     Prior to Admission medications   Medication Sig Start Date End Date Taking? Authorizing Provider  amLODipine (NORVASC) 5 MG tablet TAKE 1 TABLET BY MOUTH DAILY 10/29/23  Yes Karamalegos, Netta Neat, DO  Ascorbic Acid (VITAMIN C) 1000 MG tablet Take 1,000 mg by mouth daily.   Yes [provider]  calcium carbonate 100 mg/ml SUSP Take by mouth.   Yes [provider]  Cholecalciferol (VITAMIN D3) 10 MCG (400 UNIT) CAPS Take by mouth.   Yes [provider]  co-enzyme Q-10 30 MG capsule Take 30 mg by mouth 3 (three) times daily.   Yes [provider]  FERROUS SULFATE PO Take 1 tablet by mouth once a week.   Yes [provider]  losartan-hydrochlorothiazide (HYZAAR) 100-25 MG tablet TAKE 1 TABLET BY MOUTH DAILY  10/11/23  Yes Karamalegos, Alexander J, DO  Lutein-Zeaxanthin 15-0.7 MG CAPS Take 1 capsule by mouth daily.   Yes [provider]  metoprolol tartrate (LOPRESSOR) 25 MG tablet TAKE 1 TABLET BY MOUTH TWICE  DAILY 10/29/23  Yes Karamalegos, Netta Neat, DO  Omega-3 Fatty Acids (FISH OIL PO) Take 1 capsule by mouth daily.   Yes [provider]  pravastatin (PRAVACHOL) 20 MG tablet TAKE 1 TABLET BY MOUTH DAILY 10/29/23   Yes Karamalegos, Netta Neat, DO  Saccharomyces boulardii (PROBIOTIC) 250 MG CAPS Take 250 mg by mouth daily.   Yes [provider]  loratadine (CLARITIN) 10 MG tablet Take 1 tablet (10 mg total) by mouth daily. For up to 1 month, then as needed for seasonal allergies. Patient not taking: Reported on 06/04/2023 08/01/16   Smitty Cords, DO  zoledronic acid (RECLAST) 5 MG/100ML SOLN injection Inject 5 mg into the vein once. Patient not taking: Reported on 11/15/2023    [provider]    Physical Exam: Vitals:   11/15/23 1439 11/15/23 1612 11/15/23 1630 11/15/23 1700  BP: (!) 127/101  115/61 (!) 126/112  Pulse: 89  87 86  Resp: 18  (!) 25 (!) 25  Temp: 98.2 F (36.8 C) 100.2 F (37.9 C)    TempSrc: Oral Rectal    SpO2: 98%  99% 99%  Weight: 57.2 kg     Height: 5\' 3"  (1.6 m)      Physical Exam Constitutional:      Appearance: She is normal weight.  HENT:     Head: Normocephalic and atraumatic.     Nose: Nose normal.     Mouth/Throat:     Mouth: Mucous membranes are moist.  Eyes:     Pupils: Pupils are equal, round, and reactive to light.  Cardiovascular:     Rate and Rhythm: Normal rate and regular rhythm.  Pulmonary:     Effort: Pulmonary effort is normal.  Abdominal:     General: Bowel sounds are normal.  Musculoskeletal:        General: Normal range of motion.     Cervical back: Normal range of motion.  Skin:    Coloration: Skin is pale.  Neurological:     General: No focal deficit present.  Psychiatric:        Mood and Affect: Mood normal.     Data Reviewed:  There are no new results to review at this time.  CT Angio Chest Pulmonary Embolism (PE) W or WO Contrast CLINICAL DATA:  Shortness of breath on exertion  EXAM: CT ANGIOGRAPHY CHEST WITH CONTRAST  TECHNIQUE: Multidetector CT imaging of the chest was performed using the standard protocol during bolus administration of intravenous contrast. Multiplanar CT image  reconstructions and MIPs were obtained to evaluate the vascular anatomy.  RADIATION DOSE REDUCTION: This exam was performed according to the departmental dose-optimization program which includes automated exposure control, adjustment of the mA and/or kV according to patient size and/or use of iterative reconstruction technique.  CONTRAST:  75mL OMNIPAQUE IOHEXOL 350 MG/ML SOLN  COMPARISON:  06/22/2021  FINDINGS: Cardiovascular: Satisfactory opacification of the pulmonary arteries to the segmental level. No evidence of pulmonary embolism. Central pulmonary arteries are mildly dilated. Thoracic aorta is nonaneurysmal. Scattered atherosclerotic vascular calcifications of the aorta and coronary arteries. Mild cardiomegaly. No pericardial effusion.  Mediastinum/Nodes: No axillary, mediastinal, or hilar lymphadenopathy. Partial thyroidectomy. Trachea within normal limits. Large hiatal hernia.  Lungs/Pleura: Very slight mosaic attenuation throughout both lung fields. Lungs are  otherwise clear. No pleural effusion or pneumothorax.  Upper Abdomen: Cholelithiasis.  No acute findings.  Musculoskeletal: Severe dextroscoliotic curvature of the lower thoracic spine. No acute osseous abnormality. No significant chest wall abnormality is evident.  Review of the MIP images confirms the above findings.  IMPRESSION: 1. No evidence of pulmonary embolism. 2. Very slight mosaic attenuation throughout both lung fields, which can be seen in the setting of small airways disease. 3. Mild cardiomegaly with mild enlargement of the central pulmonary vasculature, which can be seen in the setting of pulmonary arterial hypertension. 4. Large hiatal hernia. 5. Cholelithiasis. 6. Aortic and coronary artery atherosclerosis.  Electronically Signed   By: Duanne Guess D.O.   On: 11/15/2023 14:57  Lab Results  Component Value Date   WBC 6.4 11/15/2023   HGB 5.0 (L) 11/15/2023   HCT 16.4 (L)  11/15/2023   MCV 120.6 (H) 11/15/2023   PLT 68 (L) 11/15/2023   Last metabolic panel Lab Results  Component Value Date   GLUCOSE 197 (H) 11/15/2023   NA 137 11/15/2023   K 3.3 (L) 11/15/2023   CL 103 11/15/2023   CO2 23 11/15/2023   BUN 21 11/15/2023   CREATININE 0.93 11/15/2023   GFRNONAA >60 11/15/2023   CALCIUM 9.2 11/15/2023   PROT 6.6 11/15/2023   ALBUMIN 3.8 11/15/2023   LABGLOB 2.1 02/08/2016   AGRATIO 1.9 02/08/2016   BILITOT 1.3 (H) 11/15/2023   ALKPHOS 41 11/15/2023   AST 19 11/15/2023   ALT 11 11/15/2023   ANIONGAP 11 11/15/2023    Assessment and Plan: Anemia Thrombocytopenia Progressive shortness of breath x 2 weeks with noted hemoglobin of 5 on presentation Platelet count in 60s ? Occult GI bleed is a confounding issue  Unclear etiology as patient denies any recent infections as well as no active chemotherapy use Per Dr. Anner Crete in the ER, case discussed preliminarily with on-call hematology Plan for formal evaluation including LDH, haptoglobin, reticulocyte count, peripheral smear Pending 2 unit PRBC transfusion Trend hemoglobin overnight Follow-up formal hematology recommendations  Suspected GI bleed Noted new onset anemia as well as severe thrombocytopenia with?  Occult GI bleeding Transient dark stools at home Hemoccult faintly positive in ER though hemorrhoidal disease may be confounding issue We will trend hemoglobin Consider formal GI consultation Monitor  Dyspnea Progressive dyspnea x 2-week with noted symptomatic anemia-hemoglobin 5 on presentation CT imaging noted for cardiomegaly as well as?  Pulmonary hypertension Will plan for formal 2D echo to assess Appears fairly euvolemic on presentation Diuresis appropriate Cardiology consultation as appropriate Monitor  Pre-diabetes Blood sugar 180s  SSI  A1C Monitor   HLD (hyperlipidemia) Continue statin  Essential hypertension BP stable  Titrate home regimen      Greater than 50%  was spent in counseling and coordination of care with patient Total encounter time 80 minutes or more   Advance Care Planning:   Code Status: Full Code   Consults: Hematology- GI consultation in am   Family Communication: Husband at the bedside   Severity of Illness: The appropriate patient status for this patient is OBSERVATION. Observation status is judged to be reasonable and necessary in order to provide the required intensity of service to ensure the patient's safety. The patient's presenting symptoms, physical exam findings, and initial radiographic and laboratory data in the context of their medical condition is felt to place them at decreased risk for further clinical deterioration. Furthermore, it is anticipated that the patient will be medically stable for discharge from the hospital  within 2 midnights of admission.   Author: Floydene Flock, MD 11/15/2023 5:39 PM  For on call review [email or web address].

## 2023-11-16 ENCOUNTER — Encounter
Admission: EM | Disposition: A | Discharge status: Home / Self Care | Payer: Self-pay | Source: Home / Self Care | Attending: Hospitalist

## 2023-11-16 ENCOUNTER — Observation Stay
Admit: 2023-11-16 | Discharge: 2023-11-16 | Disposition: A | Discharge status: Home / Self Care | Payer: Medicare Other | Attending: Family Medicine | Admitting: Family Medicine

## 2023-11-16 DIAGNOSIS — Z8249 Family history of ischemic heart disease and other diseases of the circulatory system: Secondary | ICD-10-CM | POA: Diagnosis not present

## 2023-11-16 DIAGNOSIS — Z79899 Other long term (current) drug therapy: Secondary | ICD-10-CM | POA: Diagnosis not present

## 2023-11-16 DIAGNOSIS — Z923 Personal history of irradiation: Secondary | ICD-10-CM | POA: Diagnosis not present

## 2023-11-16 DIAGNOSIS — M810 Age-related osteoporosis without current pathological fracture: Secondary | ICD-10-CM | POA: Diagnosis present

## 2023-11-16 DIAGNOSIS — I119 Hypertensive heart disease without heart failure: Secondary | ICD-10-CM | POA: Diagnosis present

## 2023-11-16 DIAGNOSIS — D696 Thrombocytopenia, unspecified: Secondary | ICD-10-CM

## 2023-11-16 DIAGNOSIS — C50919 Malignant neoplasm of unspecified site of unspecified female breast: Secondary | ICD-10-CM | POA: Diagnosis not present

## 2023-11-16 DIAGNOSIS — E785 Hyperlipidemia, unspecified: Secondary | ICD-10-CM | POA: Diagnosis present

## 2023-11-16 DIAGNOSIS — D539 Nutritional anemia, unspecified: Secondary | ICD-10-CM | POA: Diagnosis not present

## 2023-11-16 DIAGNOSIS — E876 Hypokalemia: Secondary | ICD-10-CM | POA: Diagnosis present

## 2023-11-16 DIAGNOSIS — D649 Anemia, unspecified: Secondary | ICD-10-CM | POA: Diagnosis not present

## 2023-11-16 DIAGNOSIS — D61818 Other pancytopenia: Secondary | ICD-10-CM | POA: Diagnosis not present

## 2023-11-16 DIAGNOSIS — Z803 Family history of malignant neoplasm of breast: Secondary | ICD-10-CM | POA: Diagnosis not present

## 2023-11-16 DIAGNOSIS — D759 Disease of blood and blood-forming organs, unspecified: Secondary | ICD-10-CM | POA: Diagnosis not present

## 2023-11-16 DIAGNOSIS — R195 Other fecal abnormalities: Secondary | ICD-10-CM | POA: Diagnosis present

## 2023-11-16 DIAGNOSIS — K649 Unspecified hemorrhoids: Secondary | ICD-10-CM | POA: Diagnosis present

## 2023-11-16 DIAGNOSIS — R7303 Prediabetes: Secondary | ICD-10-CM | POA: Diagnosis present

## 2023-11-16 DIAGNOSIS — R0600 Dyspnea, unspecified: Secondary | ICD-10-CM | POA: Diagnosis not present

## 2023-11-16 DIAGNOSIS — D4621 Refractory anemia with excess of blasts 1: Secondary | ICD-10-CM | POA: Diagnosis present

## 2023-11-16 DIAGNOSIS — M419 Scoliosis, unspecified: Secondary | ICD-10-CM | POA: Diagnosis present

## 2023-11-16 DIAGNOSIS — Z853 Personal history of malignant neoplasm of breast: Secondary | ICD-10-CM | POA: Diagnosis not present

## 2023-11-16 LAB — BPAM RBC
Blood Product Expiration Date: 202503242359
Blood Product Expiration Date: 202503242359
ISSUE DATE / TIME: 202502271809
ISSUE DATE / TIME: 202502272029
Unit Type and Rh: 5100
Unit Type and Rh: 5100

## 2023-11-16 LAB — TYPE AND SCREEN
ABO/RH(D): O POS
Antibody Screen: NEGATIVE
Unit division: 0
Unit division: 0

## 2023-11-16 LAB — CBC WITH DIFFERENTIAL/PLATELET
Abs Immature Granulocytes: 0.52 10*3/uL — ABNORMAL HIGH (ref 0.00–0.07)
Abs Immature Granulocytes: 0.31 10*3/uL — ABNORMAL HIGH (ref 0.00–0.07)
Basophils Absolute: 0 10*3/uL (ref 0.0–0.1)
Basophils Absolute: 0 10*3/uL (ref 0.0–0.1)
Basophils Relative: 0 %
Basophils Relative: 0 %
Eosinophils Absolute: 0 10*3/uL (ref 0.0–0.5)
Eosinophils Absolute: 0 10*3/uL (ref 0.0–0.5)
Eosinophils Relative: 0 %
Eosinophils Relative: 0 %
HCT: 23.6 % — ABNORMAL LOW (ref 36.0–46.0)
HCT: 24.2 % — ABNORMAL LOW (ref 36.0–46.0)
Hemoglobin: 7.9 g/dL — ABNORMAL LOW (ref 12.0–15.0)
Hemoglobin: 8.1 g/dL — ABNORMAL LOW (ref 12.0–15.0)
Immature Granulocytes: 8 %
Immature Granulocytes: 5 %
Lymphocytes Relative: 15 %
Lymphocytes Relative: 17 %
Lymphs Abs: 1 10*3/uL (ref 0.7–4.0)
Lymphs Abs: 1.1 10*3/uL (ref 0.7–4.0)
MCH: 32.5 pg (ref 26.0–34.0)
MCH: 32.3 pg (ref 26.0–34.0)
MCHC: 33.5 g/dL (ref 30.0–36.0)
MCHC: 33.5 g/dL (ref 30.0–36.0)
MCV: 97.1 fL (ref 80.0–100.0)
MCV: 96.4 fL (ref 80.0–100.0)
Monocytes Absolute: 0.8 10*3/uL (ref 0.1–1.0)
Monocytes Absolute: 0.7 10*3/uL (ref 0.1–1.0)
Monocytes Relative: 13 %
Monocytes Relative: 11 %
Neutro Abs: 3.9 10*3/uL (ref 1.7–7.7)
Neutro Abs: 4.3 10*3/uL (ref 1.7–7.7)
Neutrophils Relative %: 64 %
Neutrophils Relative %: 67 %
Platelets: 52 10*3/uL — ABNORMAL LOW (ref 150–400)
Platelets: 51 10*3/uL — ABNORMAL LOW (ref 150–400)
RBC: 2.43 MIL/uL — ABNORMAL LOW (ref 3.87–5.11)
RBC: 2.51 MIL/uL — ABNORMAL LOW (ref 3.87–5.11)
RDW: 27.4 % — ABNORMAL HIGH (ref 11.5–15.5)
RDW: 27.9 % — ABNORMAL HIGH (ref 11.5–15.5)
Smear Review: DECREASED
WBC Morphology: ABNORMAL
WBC: 6.2 10*3/uL (ref 4.0–10.5)
WBC: 6.4 10*3/uL (ref 4.0–10.5)
nRBC: 1.9 % — ABNORMAL HIGH (ref 0.0–0.2)
nRBC: 1.3 % — ABNORMAL HIGH (ref 0.0–0.2)

## 2023-11-16 LAB — CBC
HCT: 23.6 % — ABNORMAL LOW (ref 36.0–46.0)
Hemoglobin: 7.9 g/dL — ABNORMAL LOW (ref 12.0–15.0)
MCH: 32.2 pg (ref 26.0–34.0)
MCHC: 33.5 g/dL (ref 30.0–36.0)
MCV: 96.3 fL (ref 80.0–100.0)
Platelets: 49 10*3/uL — ABNORMAL LOW (ref 150–400)
RBC: 2.45 MIL/uL — ABNORMAL LOW (ref 3.87–5.11)
RDW: 27.6 % — ABNORMAL HIGH (ref 11.5–15.5)
WBC: 5.6 10*3/uL (ref 4.0–10.5)
nRBC: 1.8 % — ABNORMAL HIGH (ref 0.0–0.2)

## 2023-11-16 LAB — COMPREHENSIVE METABOLIC PANEL
ALT: 10 U/L (ref 0–44)
AST: 15 U/L (ref 15–41)
Albumin: 3.4 g/dL — ABNORMAL LOW (ref 3.5–5.0)
Alkaline Phosphatase: 34 U/L — ABNORMAL LOW (ref 38–126)
Anion gap: 10 (ref 5–15)
BUN: 20 mg/dL (ref 8–23)
CO2: 25 mmol/L (ref 22–32)
Calcium: 8.9 mg/dL (ref 8.9–10.3)
Chloride: 103 mmol/L (ref 98–111)
Creatinine, Ser: 0.73 mg/dL (ref 0.44–1.00)
GFR, Estimated: >60 mL/min (ref >60)
Glucose, Bld: 105 mg/dL — ABNORMAL HIGH (ref 70–99)
Potassium: 3 mmol/L — ABNORMAL LOW (ref 3.5–5.1)
Sodium: 138 mmol/L (ref 135–145)
Total Bilirubin: 1.7 mg/dL — ABNORMAL HIGH (ref 0.0–1.2)
Total Protein: 6 g/dL — ABNORMAL LOW (ref 6.5–8.1)

## 2023-11-16 LAB — ECHOCARDIOGRAM COMPLETE
AR max vel: 2.03 cm2
AV Area VTI: 2 cm2
AV Area mean vel: 1.82 cm2
AV Mean grad: 6 mm[Hg]
AV Peak grad: 11 mm[Hg]
Ao pk vel: 1.66 m/s
Area-P 1/2: 2.7 cm2
Height: 63 in
MV VTI: 3.46 cm2
S' Lateral: 2.2 cm
Weight: 2016 [oz_av]

## 2023-11-16 LAB — PATHOLOGIST SMEAR REVIEW

## 2023-11-16 LAB — BILIRUBIN, FRACTIONATED(TOT/DIR/INDIR)
Bilirubin, Direct: 0.2 mg/dL (ref 0.0–0.2)
Indirect Bilirubin: 1.3 mg/dL — ABNORMAL HIGH (ref 0.3–0.9)
Total Bilirubin: 1.5 mg/dL — ABNORMAL HIGH (ref 0.0–1.2)

## 2023-11-16 LAB — LACTATE DEHYDROGENASE: LDH: 148 U/L (ref 98–192)

## 2023-11-16 LAB — GLUCOSE, CAPILLARY
Glucose-Capillary: 105 mg/dL — ABNORMAL HIGH (ref 70–99)
Glucose-Capillary: 151 mg/dL — ABNORMAL HIGH (ref 70–99)
Glucose-Capillary: 109 mg/dL — ABNORMAL HIGH (ref 70–99)

## 2023-11-16 SURGERY — ESOPHAGOGASTRODUODENOSCOPY (EGD)
Anesthesia: General

## 2023-11-16 MED ORDER — POTASSIUM CHLORIDE CRYS ER 20 MEQ PO TBCR
40.0000 meq | EXTENDED_RELEASE_TABLET | Freq: Once | ORAL | Status: AC
  Administered 2023-11-16: 40 meq via ORAL
  Filled 2023-11-16: qty 2

## 2023-11-16 NOTE — Consult Note (Signed)
 Chief Complaint:  Macrocytic anemia with thrombocytopenia  Referring Provider(s): Dr. Michaelyn Barter  Supervising Physician: Malachy Moan  Patient Status: [facility] - In-pt  History of Present Illness: Rebecca Mitchell is a 83 y.o. female with a history of HTN, HLD, scoliosis, and breast cancer who presented to the [facility] ED on 11/15/23 after outpatient labs revealed Hgb of 5.0. Patient was being evaluated by her PCP for several weeks of shortness of breath and dyspnea on exertion. In the ED H/H was 5.1/16.7 down from 10.5/31.4 on 05/29/23. Patient also with platelets of 68 and MCV of 120.4. She was given 2 units of PRBCs in the ED and Hem/onc was consulted. Iron panel, B12, and folate were WNL; however, peripheral smear showed the presence of schistocytes. Haptoglobin is pending. IR consulted for bone marrow biopsy for further evaluation of anemia.  Patient resting comfortably in bed with husband at the bedside. States that she has been feeling more fatigued and short of breath in the last few weeks. She denies any other concerns of fever/chills, chest pain, N/V, or abdominal pain. Afebrile. VSS.   Patient is Full Code  Past Medical History:  Diagnosis Date   Anemia    Breast cancer (HCC) 09/2014   left breast lumpectomy with rad tx   Hyperlipidemia    Malignant neoplasm of lower-inner quadrant of left female breast (HCC) 07/2014   5 mm,T1a, N0; ER/ PR positive, HER-2/neu negative   Osteoporosis    Personal history of radiation therapy 2016   left breast ca    Past Surgical History:  Procedure Laterality Date   BREAST BIOPSY Left 08/2014   invasive mammary carcinoma   BREAST LUMPECTOMY Left 10/01/2014   invasive mammary carcinoma and DCIS with clear margins   BREAST SURGERY Left 10/01/2014   lumpectomy   COLONOSCOPY  2010   COLONOSCOPY WITH PROPOFOL N/A 09/21/2015   Procedure: COLONOSCOPY WITH PROPOFOL;  Surgeon: Kieth Brightly, MD;  Location: [facility] ENDOSCOPY;  Service:  Endoscopy;  Laterality: N/A;   UPPER GI ENDOSCOPY  2014    Allergies: Patient has no known allergies.  Medications: Prior to Admission medications   Medication Sig Start Date End Date Taking? Authorizing Provider  amLODipine (NORVASC) 5 MG tablet TAKE 1 TABLET BY MOUTH DAILY 10/29/23  Yes Karamalegos, Netta Neat, DO  Ascorbic Acid (VITAMIN C) 1000 MG tablet Take 1,000 mg by mouth daily.   Yes [provider]  calcium carbonate 100 mg/ml SUSP Take by mouth.   Yes [provider]  Cholecalciferol (VITAMIN D3) 10 MCG (400 UNIT) CAPS Take by mouth.   Yes [provider]  co-enzyme Q-10 30 MG capsule Take 30 mg by mouth 3 (three) times daily.   Yes [provider]  FERROUS SULFATE PO Take 1 tablet by mouth once a week.   Yes [provider]  losartan-hydrochlorothiazide (HYZAAR) 100-25 MG tablet TAKE 1 TABLET BY MOUTH DAILY 10/11/23  Yes Karamalegos, Alexander J, DO  Lutein-Zeaxanthin 15-0.7 MG CAPS Take 1 capsule by mouth daily.   Yes [provider]  metoprolol tartrate (LOPRESSOR) 25 MG tablet TAKE 1 TABLET BY MOUTH TWICE  DAILY 10/29/23  Yes Karamalegos, Netta Neat, DO  Omega-3 Fatty Acids (FISH OIL PO) Take 1 capsule by mouth daily.   Yes [provider]  pravastatin (PRAVACHOL) 20 MG tablet TAKE 1 TABLET BY MOUTH DAILY 10/29/23  Yes Karamalegos, Netta Neat, DO  Saccharomyces boulardii (PROBIOTIC) 250 MG CAPS Take 250 mg by mouth daily.  Yes [provider]  loratadine (CLARITIN) 10 MG tablet Take 1 tablet (10 mg total) by mouth daily. For up to 1 month, then as needed for seasonal allergies. Patient not taking: Reported on 06/04/2023 08/01/16   Smitty Cords, DO  zoledronic acid (RECLAST) 5 MG/100ML SOLN injection Inject 5 mg into the vein once. Patient not taking: Reported on 11/15/2023    [provider]     Family History  Problem Relation Age of Onset   Breast cancer Sister 52   Heart disease  Mother    Heart disease Father     Social History   Socioeconomic History   Marital status: Married    Spouse name: Not on file   Number of children: Not on file   Years of education: Not on file   Highest education level: Bachelor's degree (e.g., BA, AB, BS)  Occupational History   Not on file  Tobacco Use   Smoking status: Never   Smokeless tobacco: Never  Vaping Use   Vaping status: Never Used  Substance and Sexual Activity   Alcohol use: No    Alcohol/week: 0.0 standard drinks of alcohol   Drug use: No   Sexual activity: Not on file  Other Topics Concern   Not on file  Social History Narrative   Not on file   Social Drivers of Health   Financial Resource Strain: Low Risk  (06/14/2023)   [facility] from [facility]   Overall Financial Resource Strain (CARDIA)    Difficulty of Paying Living Expenses: Not hard at all  Food Insecurity: No Food Insecurity (11/15/2023)   Hunger Vital Sign    Worried About Running Out of Food in the Last Year: Never true    Ran Out of Food in the Last Year: Never true  Transportation Needs: No Transportation Needs (11/15/2023)   PRAPARE - Administrator, Civil Service (Medical): No    Lack of Transportation (Non-Medical): No  Physical Activity: Inactive (03/07/2023)   Exercise Vital Sign    Days of Exercise per Week: 0 days    Minutes of Exercise per Session: 0 min  Stress: No Stress Concern Present (03/07/2023)   [employer] of Occupational Health - Occupational Stress Questionnaire    Feeling of Stress : Not at all  Social Connections: Patient Declined (11/15/2023)   Social Connection and Isolation Panel [NHANES]    Frequency of Communication with Friends and Family: Patient declined    Frequency of Social Gatherings with Friends and Family: Patient declined    Attends Religious Services: Patient declined    Database administrator or Organizations: Patient declined    Attends Banker  Meetings: Patient declined    Marital Status: Patient declined     Review of Systems  Constitutional:  Positive for fatigue.  Respiratory:  Positive for shortness of breath.   Patient denies any headache, chest pain, abdominal pain, N/V, or fever/chills. All other systems are negative.   Vital Signs: BP 122/61 (BP Location: Left Arm)   Pulse 78   Temp 98.4 F (36.9 C) (Oral)   Resp 18   Ht 5\' 3"  (1.6 m)   Wt 126 lb (57.2 kg)   SpO2 100%   BMI 22.32 kg/m    Physical Exam Vitals reviewed.  Constitutional:      Appearance: Normal appearance.  HENT:     Head: Normocephalic and atraumatic.     Mouth/Throat:     Mouth: Mucous membranes  are moist.     Pharynx: Oropharynx is clear.  Cardiovascular:     Rate and Rhythm: Normal rate and regular rhythm.  Pulmonary:     Effort: Pulmonary effort is normal.  Abdominal:     General: Abdomen is flat.     Palpations: Abdomen is soft.  Musculoskeletal:        General: Normal range of motion.     Cervical back: Normal range of motion.  Skin:    General: Skin is warm and dry.  Neurological:     General: No focal deficit present.     Mental Status: She is alert and oriented to person, place, and time. Mental status is at baseline.  Psychiatric:        Mood and Affect: Mood normal.        Behavior: Behavior normal.        Judgment: Judgment normal.     Imaging: ECHOCARDIOGRAM COMPLETE Result Date: 11/16/2023    ECHOCARDIOGRAM REPORT   Patient Name:   [facility] A Korber Date of Exam: 11/16/2023 Medical Rec #:  [id number]    Height:       63.0 in Accession #:    [id number]   Weight:       126.0 lb Date of Birth:  1941/08/11    BSA:          1.589 m Patient Age:    82 years     BP:           120/63 mmHg Patient Gender: F            HR:           87 bpm. Exam Location:  [facility] Procedure: 2D Echo, Cardiac Doppler and Color Doppler (Both Spectral and Color            Flow Doppler were utilized during procedure). Indications:     Dyspnea R06.00   History:         Patient has no prior history of Echocardiogram examinations.                  Risk Factors:Dyslipidemia.  Sonographer:     Cristela Blue Referring Phys:  [id number] Francoise Schaumann [place] Diagnosing Phys: Marcina Millard MD IMPRESSIONS  1. Left ventricular ejection fraction, by estimation, is 65 to 70%. The left ventricle has normal function. The left ventricle has no regional wall motion abnormalities. Left ventricular diastolic parameters are consistent with Grade I diastolic dysfunction (impaired relaxation).  2. Right ventricular systolic function is normal. The right ventricular size is normal.  3. The mitral valve is normal in structure. Mild to moderate mitral valve regurgitation. No evidence of mitral stenosis.  4. The aortic valve is normal in structure. Aortic valve regurgitation is not visualized. No aortic stenosis is present.  5. The inferior vena cava is normal in size with greater than 50% respiratory variability, suggesting right atrial pressure of 3 mmHg. FINDINGS  Left Ventricle: Left ventricular ejection fraction, by estimation, is 65 to 70%. The left ventricle has normal function. The left ventricle has no regional wall motion abnormalities. Strain imaging was not performed. The left ventricular internal cavity  size was normal in size. There is no left ventricular hypertrophy. Left ventricular diastolic parameters are consistent with Grade I diastolic dysfunction (impaired relaxation). Right Ventricle: The right ventricular size is normal. No increase in right ventricular wall thickness. Right ventricular systolic function is normal. Left Atrium: Left atrial size was normal in size. Right Atrium: Right  atrial size was normal in size. Pericardium: There is no evidence of pericardial effusion. Mitral Valve: The mitral valve is normal in structure. Mild to moderate mitral valve regurgitation. No evidence of mitral valve stenosis. MV peak gradient, 2.4 mmHg. The mean mitral valve gradient is  1.0 mmHg. Tricuspid Valve: The tricuspid valve is normal in structure. Tricuspid valve regurgitation is mild . No evidence of tricuspid stenosis. Aortic Valve: The aortic valve is normal in structure. Aortic valve regurgitation is not visualized. No aortic stenosis is present. Aortic valve mean gradient measures 6.0 mmHg. Aortic valve peak gradient measures 11.0 mmHg. Aortic valve area, by VTI measures 2.00 cm. Pulmonic Valve: The pulmonic valve was normal in structure. Pulmonic valve regurgitation is not visualized. No evidence of pulmonic stenosis. Aorta: The aortic root is normal in size and structure. Venous: The inferior vena cava is normal in size with greater than 50% respiratory variability, suggesting right atrial pressure of 3 mmHg. IAS/Shunts: No atrial level shunt detected by color flow Doppler. Additional Comments: 3D imaging was not performed.  LEFT VENTRICLE PLAX 2D LVIDd:         4.00 cm   Diastology LVIDs:         2.20 cm   LV e' medial:    9.90 cm/s LV PW:         1.00 cm   LV E/e' medial:  7.0 LV IVS:        1.20 cm   LV e' lateral:   11.20 cm/s LVOT diam:     2.00 cm   LV E/e' lateral: 6.2 LV SV:         69 LV SV Index:   43 LVOT Area:     3.14 cm  RIGHT VENTRICLE RV Basal diam:  3.40 cm RV Mid diam:    2.50 cm LEFT ATRIUM           Index        RIGHT ATRIUM           Index LA diam:      3.90 cm 2.45 cm/m   RA Area:     15.40 cm LA Vol (A2C): 41.6 ml 26.18 ml/m  RA Volume:   32.30 ml  20.33 ml/m LA Vol (A4C): 41.0 ml 25.80 ml/m  AORTIC VALVE                     PULMONIC VALVE AV Area (Vmax):    2.03 cm      PR End Diast Vel: 9.24 msec AV Area (Vmean):   1.82 cm AV Area (VTI):     2.00 cm AV Vmax:           165.50 cm/s AV Vmean:          112.000 cm/s AV VTI:            0.344 m AV Peak Grad:      11.0 mmHg AV Mean Grad:      6.0 mmHg LVOT Vmax:         107.00 cm/s LVOT Vmean:        64.800 cm/s LVOT VTI:          0.219 m LVOT/AV VTI ratio: 0.64  AORTA Ao Root diam: 2.80 cm MITRAL VALVE                TRICUSPID VALVE MV Area (PHT): 2.70 cm    TR Peak grad:   37.5 mmHg MV Area VTI:  3.46 cm    TR Vmax:        306.00 cm/s MV Peak grad:  2.4 mmHg MV Mean grad:  1.0 mmHg    SHUNTS MV Vmax:       0.78 m/s    Systemic VTI:  0.22 m MV Vmean:      54.2 cm/s   Systemic Diam: 2.00 cm MV Decel Time: 281 msec MV E velocity: 69.70 cm/s MV A velocity: 89.10 cm/s MV E/A ratio:  0.78 Marcina Millard MD Electronically signed by Marcina Millard MD Signature Date/Time: 11/16/2023/1:15:18 PM    Final    CT Angio Chest Pulmonary Embolism (PE) W or WO Contrast Result Date: 11/15/2023 CLINICAL DATA:  Shortness of breath on exertion EXAM: CT ANGIOGRAPHY CHEST WITH CONTRAST TECHNIQUE: Multidetector CT imaging of the chest was performed using the standard protocol during bolus administration of intravenous contrast. Multiplanar CT image reconstructions and MIPs were obtained to evaluate the vascular anatomy. RADIATION DOSE REDUCTION: This exam was performed according to the departmental dose-optimization program which includes automated exposure control, adjustment of the mA and/or kV according to patient size and/or use of iterative reconstruction technique. CONTRAST:  75mL OMNIPAQUE IOHEXOL 350 MG/ML SOLN COMPARISON:  06/22/2021 FINDINGS: Cardiovascular: Satisfactory opacification of the pulmonary arteries to the segmental level. No evidence of pulmonary embolism. Central pulmonary arteries are mildly dilated. Thoracic aorta is nonaneurysmal. Scattered atherosclerotic vascular calcifications of the aorta and coronary arteries. Mild cardiomegaly. No pericardial effusion. Mediastinum/Nodes: No axillary, mediastinal, or hilar lymphadenopathy. Partial thyroidectomy. Trachea within normal limits. Large hiatal hernia. Lungs/Pleura: Very slight mosaic attenuation throughout both lung fields. Lungs are otherwise clear. No pleural effusion or pneumothorax. Upper Abdomen: Cholelithiasis.  No acute findings.  Musculoskeletal: Severe dextroscoliotic curvature of the lower thoracic spine. No acute osseous abnormality. No significant chest wall abnormality is evident. Review of the MIP images confirms the above findings. IMPRESSION: 1. No evidence of pulmonary embolism. 2. Very slight mosaic attenuation throughout both lung fields, which can be seen in the setting of small airways disease. 3. Mild cardiomegaly with mild enlargement of the central pulmonary vasculature, which can be seen in the setting of pulmonary arterial hypertension. 4. Large hiatal hernia. 5. Cholelithiasis. 6. Aortic and coronary artery atherosclerosis. Electronically Signed   By: Duanne Guess D.O.   On: 11/15/2023 14:57   MM 3D SCREENING MAMMOGRAM BILATERAL BREAST Result Date: 11/12/2023 CLINICAL DATA:  Screening. EXAM: DIGITAL SCREENING BILATERAL MAMMOGRAM WITH TOMOSYNTHESIS AND CAD TECHNIQUE: Bilateral screening digital craniocaudal and mediolateral oblique mammograms were obtained. Bilateral screening digital breast tomosynthesis was performed. The images were evaluated with computer-aided detection. COMPARISON:  Previous exam(s). ACR Breast Density Category c: The breasts are heterogeneously dense, which may obscure small masses. FINDINGS: There are no findings suspicious for malignancy. IMPRESSION: No mammographic evidence of malignancy. A result letter of this screening mammogram will be mailed directly to the patient. RECOMMENDATION: Screening mammogram in one year. (Code:SM-B-01Y) BI-RADS CATEGORY  1: Negative. Electronically Signed   By: Harmon Pier M.D.   On: 11/12/2023 16:34    Labs:  CBC: Recent Labs    05/28/23 0758 11/15/23 1442 11/16/23 0203 11/16/23 0507  WBC 2.9* 6.3  6.4 6.2 5.6  HGB 10.5* 5.0*  5.0* 7.9* 7.9*  HCT 31.4* 16.5*  16.4* 23.6* 23.6*  PLT 123* 68*  68* 52* 49*    COAGS: No results for input(s): "INR", "APTT" in the last 8760 hours.  BMP: Recent Labs    05/28/23 0758 11/15/23 1442  11/16/23 0507  NA 140 137 138  K 3.9 3.3* 3.0*  CL 103 103 103  CO2 29 23 25   GLUCOSE 115* 197* 105*  BUN 20 21 20   CALCIUM 9.8 9.2 8.9  CREATININE 0.80 0.93 0.73  GFRNONAA  --  >60 >60    LIVER FUNCTION TESTS: Recent Labs    05/28/23 0758 11/15/23 1442 11/16/23 0507 11/16/23 1316  BILITOT 0.7 1.3* 1.7* 1.5*  AST 16 19 15   --   ALT 13 11 10   --   ALKPHOS  --  41 34*  --   PROT 6.7 6.6 6.0*  --   ALBUMIN  --  3.8 3.4*  --     TUMOR MARKERS: No results for input(s): "AFPTM", "CEA", "CA199", "CHROMGRNA" in the last 8760 hours.  Assessment and Plan:  Macrocytic anemia with thrombocytopenia: Rebecca Mitchell is a 83 y.o. female with a history of breast cancer, scoliosis, HTN, HLD who presents to [facility] Interventional Radiology department for an image-guided bone marrow biopsy with Dr. Vella Redhead on 11/19/23 . Procedure to be performed under moderate sedation.  Risks and benefits of bone marrow biopsy was discussed with the patient and/or patient's family including, but not limited to bleeding, infection, damage to adjacent structures or low yield requiring additional tests.  All of the questions were answered and there is agreement to proceed.  Consent signed and in chart.   Thank you for allowing our service to participate in Rebecca Mitchell 's care.    Electronically Signed: Jama Flavors, PA-C   11/16/2023, 4:43 PM     I spent a total of 20 Minutes in face to face in clinical consultation, greater than 50% of which was counseling/coordinating care for bone marrow biopsy.    (A copy of this note was sent to the referring provider and the time of visit.)

## 2023-11-16 NOTE — Consult Note (Signed)
 Midge Minium, MD [facility]  [address], [address] [place], [place] [address] Phone: [phone] Fax : [phone]  Consultation  Referring Provider:     Dr. Patrecia Pace Primary Care Physician:  Smitty Cords, DO Primary Gastroenterologist:      Gentry Fitz     Reason for Consultation:     Anemia  Date of Admission:  11/15/2023 Date of Consultation:  11/16/2023         HPI:   Rebecca Mitchell is a 83 y.o. female who has a history of having a colonoscopy by Dr. Evette Cristal in 2017 with a small polyp removed that was found to be normal mucosa.  The patient also had an EGD and colonoscopy by Dr. Bluford Kaufmann back in 2010.  Patient now came to the emergency department with anemia.  She reports that she has been short of breath for the last few weeks and can only walk about 20 feet before feeling that she is short of breath.  The patient had blood work done as an outpatient and was found to have a hemoglobin of 5.0.  She does have a history of hemorrhoids but has not had any issues with it recently.  She had labs sent off that showed:  Component     Latest Ref Rng 05/28/2023 11/15/2023 11/16/2023  Hemoglobin     12.0 - 15.0 g/dL 71.6 (L)  5.0 (L)  7.9 (L)   Hemoglobin       5.0 (L)  7.9 (L)   HCT     36.0 - 46.0 % 31.4 (L)  16.4 (L)  23.6 (L)   HCT       16.5 (L)  23.6 (L)   MCV     80.0 - 100.0 fL 110.6 (H)  120.6 (H)  96.3   MCV       120.4 (H)  97.1    The patient's iron studies showed iron to be normal at 160 with TIBC normal at 357 with saturation high at 45% and the patient's iron was also normal 5 months ago.  Her ferritin was 65.  The patient's platelets have also been going down over the last 5 months with thrombocytopenia of 123 5 months ago and down to 49 today.  The patient's B12 and folate are also normal and the patient haptoglobin is pending.  Past Medical History:  Diagnosis Date   Anemia    Breast cancer (HCC) 09/2014   left breast lumpectomy with rad tx   Hyperlipidemia     Malignant neoplasm of lower-inner quadrant of left female breast (HCC) 07/2014   5 mm,T1a, N0; ER/ PR positive, HER-2/neu negative   Osteoporosis    Personal history of radiation therapy 2016   left breast ca    Past Surgical History:  Procedure Laterality Date   BREAST BIOPSY Left 08/2014   invasive mammary carcinoma   BREAST LUMPECTOMY Left 10/01/2014   invasive mammary carcinoma and DCIS with clear margins   BREAST SURGERY Left 10/01/2014   lumpectomy   COLONOSCOPY  2010   COLONOSCOPY WITH PROPOFOL N/A 09/21/2015   Procedure: COLONOSCOPY WITH PROPOFOL;  Surgeon: Kieth Brightly, MD;  Location: [facility] ENDOSCOPY;  Service: Endoscopy;  Laterality: N/A;   UPPER GI ENDOSCOPY  2014    Prior to Admission medications   Medication Sig Start Date End Date Taking? Authorizing Provider  amLODipine (NORVASC) 5 MG tablet TAKE 1 TABLET BY MOUTH DAILY 10/29/23  Yes Smitty Cords, DO  Ascorbic  Acid (VITAMIN C) 1000 MG tablet Take 1,000 mg by mouth daily.   Yes [provider]  calcium carbonate 100 mg/ml SUSP Take by mouth.   Yes [provider]  Cholecalciferol (VITAMIN D3) 10 MCG (400 UNIT) CAPS Take by mouth.   Yes [provider]  co-enzyme Q-10 30 MG capsule Take 30 mg by mouth 3 (three) times daily.   Yes [provider]  FERROUS SULFATE PO Take 1 tablet by mouth once a week.   Yes [provider]  losartan-hydrochlorothiazide (HYZAAR) 100-25 MG tablet TAKE 1 TABLET BY MOUTH DAILY 10/11/23  Yes Karamalegos, Alexander J, DO  Lutein-Zeaxanthin 15-0.7 MG CAPS Take 1 capsule by mouth daily.   Yes [provider]  metoprolol tartrate (LOPRESSOR) 25 MG tablet TAKE 1 TABLET BY MOUTH TWICE  DAILY 10/29/23  Yes Karamalegos, Netta Neat, DO  Omega-3 Fatty Acids (FISH OIL PO) Take 1 capsule by mouth daily.   Yes [provider]  pravastatin (PRAVACHOL) 20 MG tablet TAKE 1 TABLET BY MOUTH DAILY 10/29/23  Yes Karamalegos, Netta Neat,  DO  Saccharomyces boulardii (PROBIOTIC) 250 MG CAPS Take 250 mg by mouth daily.   Yes [provider]  loratadine (CLARITIN) 10 MG tablet Take 1 tablet (10 mg total) by mouth daily. For up to 1 month, then as needed for seasonal allergies. Patient not taking: Reported on 06/04/2023 08/01/16   Smitty Cords, DO  zoledronic acid (RECLAST) 5 MG/100ML SOLN injection Inject 5 mg into the vein once. Patient not taking: Reported on 11/15/2023    [provider]    Family History  Problem Relation Age of Onset   Breast cancer Sister 2   Heart disease Mother    Heart disease Father      Social History   Tobacco Use   Smoking status: Never   Smokeless tobacco: Never  Vaping Use   Vaping status: Never Used  Substance Use Topics   Alcohol use: No    Alcohol/week: 0.0 standard drinks of alcohol   Drug use: No    Allergies as of 11/15/2023   (No Known Allergies)    Review of Systems:    All systems reviewed and negative except where noted in HPI.   Physical Exam:  Vital signs in last 24 hours: Temp:  [98.2 F (36.8 C)-100.2 F (37.9 C)] 98.6 F (37 C) (02/28 0800) Pulse Rate:  [79-90] 79 (02/28 0800) Resp:  [16-25] 18 (02/28 0800) BP: (112-127)/(53-112) 121/56 (02/28 0800) SpO2:  [95 %-99 %] 95 % (02/28 0800) Weight:  [57.2 kg] 57.2 kg (02/27 1439)   General:   Pleasant, cooperative in NAD Head:  Normocephalic and atraumatic. Eyes:   No icterus.   Conjunctiva pale. PERRLA. Ears:  Normal auditory acuity. Neck:  Supple; no masses or thyroidomegaly Lungs: Respirations even and unlabored. Lungs clear to auscultation bilaterally.   No wheezes, crackles, or rhonchi.  Heart:  Regular rate and rhythm;  Without murmur, clicks, rubs or gallops Abdomen:  Soft, nondistended, nontender. Normal bowel sounds. No appreciable masses or hepatomegaly.  No rebound or guarding.  Rectal:  Not performed. Msk:  Symmetrical without gross deformities.   Extremities:   Without edema, cyanosis or clubbing. Neurologic:  Alert and oriented x3;  grossly normal neurologically. Skin:  Intact without significant lesions or rashes. Cervical Nodes:  No significant cervical adenopathy. Psych:  Alert and cooperative. Normal affect.  LAB RESULTS: Recent Labs    11/15/23 1442 11/16/23 0203 11/16/23 0507  WBC 6.3  6.4 6.2 5.6  HGB 5.0*  5.0* 7.9* 7.9*  HCT 16.5*  16.4* 23.6* 23.6*  PLT 68*  68* 52* 49*   BMET Recent Labs    11/15/23 1442 11/16/23 0507  NA 137 138  K 3.3* 3.0*  CL 103 103  CO2 23 25  GLUCOSE 197* 105*  BUN 21 20  CREATININE 0.93 0.73  CALCIUM 9.2 8.9   LFT Recent Labs    11/16/23 0507  PROT 6.0*  ALBUMIN 3.4*  AST 15  ALT 10  ALKPHOS 34*  BILITOT 1.7*   PT/INR No results for input(s): "LABPROT", "INR" in the last 72 hours.  STUDIES: CT Angio Chest Pulmonary Embolism (PE) W or WO Contrast Result Date: 11/15/2023 CLINICAL DATA:  Shortness of breath on exertion EXAM: CT ANGIOGRAPHY CHEST WITH CONTRAST TECHNIQUE: Multidetector CT imaging of the chest was performed using the standard protocol during bolus administration of intravenous contrast. Multiplanar CT image reconstructions and MIPs were obtained to evaluate the vascular anatomy. RADIATION DOSE REDUCTION: This exam was performed according to the departmental dose-optimization program which includes automated exposure control, adjustment of the mA and/or kV according to patient size and/or use of iterative reconstruction technique. CONTRAST:  75mL OMNIPAQUE IOHEXOL 350 MG/ML SOLN COMPARISON:  06/22/2021 FINDINGS: Cardiovascular: Satisfactory opacification of the pulmonary arteries to the segmental level. No evidence of pulmonary embolism. Central pulmonary arteries are mildly dilated. Thoracic aorta is nonaneurysmal. Scattered atherosclerotic vascular calcifications of the aorta and coronary arteries. Mild cardiomegaly. No pericardial effusion. Mediastinum/Nodes: No axillary,  mediastinal, or hilar lymphadenopathy. Partial thyroidectomy. Trachea within normal limits. Large hiatal hernia. Lungs/Pleura: Very slight mosaic attenuation throughout both lung fields. Lungs are otherwise clear. No pleural effusion or pneumothorax. Upper Abdomen: Cholelithiasis.  No acute findings. Musculoskeletal: Severe dextroscoliotic curvature of the lower thoracic spine. No acute osseous abnormality. No significant chest wall abnormality is evident. Review of the MIP images confirms the above findings. IMPRESSION: 1. No evidence of pulmonary embolism. 2. Very slight mosaic attenuation throughout both lung fields, which can be seen in the setting of small airways disease. 3. Mild cardiomegaly with mild enlargement of the central pulmonary vasculature, which can be seen in the setting of pulmonary arterial hypertension. 4. Large hiatal hernia. 5. Cholelithiasis. 6. Aortic and coronary artery atherosclerosis. Electronically Signed   By: Duanne Guess D.O.   On: 11/15/2023 14:57      Impression / Plan:   Assessment: Principal Problem:   Cytopenia Active Problems:   Anemia   Essential hypertension   HLD (hyperlipidemia)   Pre-diabetes   Suspected GI bleed   Dyspnea   Rebecca Mitchell is a 83 y.o. y/o female with with pancytopenia and decreasing platelets over 13-month.  The patient has no sign of GI bleeding.  The patient denies any sign of GI bleeding.  Plan:  The patient should have a hematology consult since the patient's anemia is in conjunction with thrombocytopenia with an elevated MCV and normal iron studies.  This patient does not need a GI workup.  Nothing further to do from a GI point of view.  I will sign off.  Please call if any further GI concerns or questions.  We would like to thank you for the opportunity to participate in the care of Rebecca Mitchell.   Thank you for involving me in the care of this patient.      LOS: 0 days   Midge Minium, MD, MD. Clementeen Graham 11/16/2023, 8:31  AM,  Pager [phone] 7am-5pm  Check  AMION for 5pm -7am coverage and on weekends   Note: This dictation was prepared with Dragon dictation along with smaller phrase technology. Any transcriptional errors that result from this process are unintentional.

## 2023-11-16 NOTE — Progress Notes (Signed)
  PROGRESS NOTE    SHALAINE PAYSON  WGN:[id number] DOB: 24-Nov-1940 DOA: 11/15/2023 PCP: Smitty Cords, DO  [id number]  LOS: 0 days   Brief hospital course:   Assessment & Plan: ALLAN BACIGALUPI is a 83 y.o. female with medical history significant of hypertension, hyperlipidemia presenting with shortness of breath, anemia, thrombocytopenia,?  GI bleeding.  Patient reports approximate 2 weeks of increased work of breathing.  Worsening dyspnea on exertion as well as overall fatigue.  Denies any recent infections.  No recent medication changes.   Anemia Thrombocytopenia Progressive shortness of breath x 2 weeks with noted hemoglobin of 5 on presentation Platelet count in 60s --iron profile wnl, GI deemed unlikely to be GI bleed. --s/p 2u pRBC --Hematology consulted Plan: --monitor Hgb and transfuse to keep >7 --bone marrow biopsy on Monday  Dyspnea Progressive dyspnea x 2-week with noted symptomatic anemia-hemoglobin 5 on presentation CT imaging noted for cardiomegaly as well as?  Pulmonary hypertension --dyspnea did improve with blood transfusion --Echo  Pre-diabetes --d/c BG checks and SSI  HLD (hyperlipidemia) --resume statin after discharge  Essential hypertension --hold home BP meds for now  Hypokalemia --monitor and supplement PRN   DVT prophylaxis: SCD/Compression stockings Code Status: Full code  Family Communication: husband updated at bedside today Level of care: Med-Surg Dispo:   The patient is from: home Anticipated d/c is to: home Anticipated d/c date is: Monday    Subjective and Interval History:  Pt reported feeling much better after 2u pRBC.   Objective: Vitals:   11/16/23 0517 11/16/23 0800 11/16/23 1628 11/16/23 1931  BP: 120/63 (!) 121/56 122/61 (!) 111/54  Pulse: 87 79 78 85  Resp: 16 18 18 18   Temp: 98.3 F (36.8 C) 98.6 F (37 C) 98.4 F (36.9 C) 97.9 F (36.6 C)  TempSrc: Oral Oral Oral Oral  SpO2: 96% 95% 100% 98%  Weight:       Height:        Intake/Output Summary (Last 24 hours) at 11/16/2023 2023 Last data filed at 11/15/2023 2320 Gross per 24 hour  Intake 420 ml  Output --  Net 420 ml   Filed Weights   11/15/23 1439  Weight: 57.2 kg    Examination:   Constitutional: NAD, AAOx3 HEENT: conjunctivae and lids normal, EOMI CV: No cyanosis.   RESP: normal respiratory effort, on RA Neuro: II - XII grossly intact.   Psych: Normal mood and affect.  Appropriate judgement and reason   Data Reviewed: I have personally reviewed labs and imaging studies  Time spent: 50 minutes  Darlin Priestly, MD [facility] If 7PM-7AM, please contact night-coverage 11/16/2023, 8:23 PM

## 2023-11-16 NOTE — Consult Note (Signed)
 [facility]  [phone] [phone]  ID: [name] OB: [birth year]  MR#: [id number]  VOZ#:[id number]  Patient Care Team: Smitty Cords, DO as PCP - General (Family Medicine) Carmina Miller, MD as Referring Physician (Radiation Oncology) Lemar Livings Merrily Pew, MD (General Surgery) Jesusita Oka, MD (Dermatology) Tedd Sias Marlana Salvage, MD as Physician Assistant (Endocrinology)  REFERRING PROVIDER: Dr. Alvester Morin  REASON FOR REFERRAL: anemia, thrombocytopenia  ASSESSMENT AND PLAN:   Rebecca Mitchell is a 83 y.o. female with pmh of hypertension, hyperlipidemia presented to [facility] on 11/15/2023 for progressively increased shortness of breath.  Further workup showed severe anemia with hemoglobin of 5.  # Macrocytic anemia # Thrombocytopenia  - S/p 2 units of PRBC transfusion.  Hemoglobin has improved to 7.9.  Patient was evaluated with GI and with no signs of GI bleed no further workup was recommended.  -Patient has macrocytic anemia for 2 years which has been progressing along with thrombocytopenia.  Thrombocytopenia was first noted 6 months ago.  As above, iron panel, B12 and folate are normal.  Peripheral smear review, there is presence of schistocytes.  However, LDH is completely normal normal with reticulocyte of 2.5% which is low for the degree of anemia.  Total bili yesterday was 1.3.  Today it is 1.7.  I will obtain bilirubin fractionation.  Haptoglobin is pending.  Not completely fitting hemolysis picture.  I also discussed with the patient in detail about the bone marrow biopsy procedure to rule out any lymphoproliferative disorder/MDS to explain her cytopenias.  Will coordinate with IR  -Recommend CBC with differential monitoring twice a day for now -Continue with supportive care with transfusion as needed  Will continue to follow the patient.  Thank you for the referral.  Patient expressed understanding and was in agreement with this plan.  She also understands that She can call clinic at any time with any questions, concerns, or complaints.   I spent a total of 60 minutes reviewing chart data, face-to-face evaluation with the patient, counseling and coordination of care as detailed above.  HPI: Rebecca Mitchell is a 83 y.o. female with past medical history of hypertension, hyperlipidemia presented to [facility] on 11/15/2023 for progressively increased shortness of breath.    On admission, WBC 6.4, hemoglobin 5, platelets 68.  MCV 120.  Nucleated RBCs.  CBC from 05/28/2023 showed WBC 2.9, hemoglobin 10.5, MCV 110.6 with platelets of 123.  CMP showed mild elevated total bili of 1.3.  LDH normal at 167.  Reticulocyte 2.5% low for the degree of anemia.  B12 457.  Folate normal.  Iron panel normal.  Haptoglobin is pending.  S/p 2 units of PRBC transfusion.  Hemoglobin has improved to 7.9.  Patient was seen today at bedside accompanied with husband.  She reports feeling significantly better after 2 units of blood transfusion.  Shortness of breath is resolved.  Denies any black tarry stools or blood in urine or stools.   REVIEW OF SYSTEMS:   ROS  As per HPI. Otherwise, a complete review of systems is negative.  PAST MEDICAL HISTORY: Past Medical History:  Diagnosis Date   Anemia    Breast cancer (HCC) 09/2014   left breast lumpectomy with rad tx   Hyperlipidemia    Malignant neoplasm of lower-inner quadrant of left female breast (HCC) 07/2014   5 mm,T1a, N0; ER/ PR positive, HER-2/neu negative   Osteoporosis    Personal history of radiation therapy 2016   left breast ca    PAST  SURGICAL HISTORY: Past Surgical History:  Procedure Laterality Date   BREAST BIOPSY Left 08/2014   invasive mammary carcinoma   BREAST LUMPECTOMY Left 10/01/2014   invasive mammary carcinoma and DCIS with clear margins   BREAST SURGERY Left 10/01/2014   lumpectomy   COLONOSCOPY  2010   COLONOSCOPY WITH PROPOFOL N/A 09/21/2015   Procedure: COLONOSCOPY WITH  PROPOFOL;  Surgeon: Kieth Brightly, MD;  Location: [facility] ENDOSCOPY;  Service: Endoscopy;  Laterality: N/A;   UPPER GI ENDOSCOPY  2014    FAMILY HISTORY: Family History  Problem Relation Age of Onset   Breast cancer Sister 41   Heart disease Mother    Heart disease Father     HEALTH MAINTENANCE: Social History   Tobacco Use   Smoking status: Never   Smokeless tobacco: Never  Vaping Use   Vaping status: Never Used  Substance Use Topics   Alcohol use: No    Alcohol/week: 0.0 standard drinks of alcohol   Drug use: No     No Known Allergies  Current Facility-Administered Medications  Medication Dose Route Frequency Provider Last Rate Last Admin   insulin aspart (novoLOG) injection 0-9 Units  0-9 Units Subcutaneous TID WC Floydene Flock, MD       ondansetron [facility]) tablet 4 mg  4 mg Oral Q6H PRN Floydene Flock, MD       Or   ondansetron [facility]) injection 4 mg  4 mg Intravenous Q6H PRN Floydene Flock, MD       pantoprazole (PROTONIX) injection 40 mg  40 mg Intravenous Q12H Floydene Flock, MD   40 mg at 11/15/23 2106    OBJECTIVE: Vitals:   11/16/23 0517 11/16/23 0800  BP: 120/63 (!) 121/56  Pulse: 87 79  Resp: 16 18  Temp: 98.3 F (36.8 C) 98.6 F (37 C)  SpO2: 96% 95%     Body mass index is 22.32 kg/m.      General: Well-developed, well-nourished, no acute distress. Eyes: Pink conjunctiva, anicteric sclera. HEENT: Normocephalic, moist mucous membranes, clear oropharnyx. Lungs: Clear to auscultation bilaterally. Heart: Regular rate and rhythm. No rubs, murmurs, or gallops. Abdomen: Soft, nontender, nondistended. No organomegaly noted, normoactive bowel sounds. Musculoskeletal: No edema, cyanosis, or clubbing. Neuro: Alert, answering all questions appropriately. Cranial nerves grossly intact. Skin: No rashes or petechiae noted. Psych: Normal affect. Lymphatics: No cervical, calvicular, axillary or inguinal LAD.   LAB RESULTS:  Lab Results   Component Value Date   NA 138 11/16/2023   K 3.0 (L) 11/16/2023   CL 103 11/16/2023   CO2 25 11/16/2023   GLUCOSE 105 (H) 11/16/2023   BUN 20 11/16/2023   CREATININE 0.73 11/16/2023   CALCIUM 8.9 11/16/2023   PROT 6.0 (L) 11/16/2023   ALBUMIN 3.4 (L) 11/16/2023   AST 15 11/16/2023   ALT 10 11/16/2023   ALKPHOS 34 (L) 11/16/2023   BILITOT 1.7 (H) 11/16/2023   GFRNONAA >60 11/16/2023   GFRAA 98 05/10/2020    Lab Results  Component Value Date   WBC 5.6 11/16/2023   NEUTROABS 3.9 11/16/2023   HGB 7.9 (L) 11/16/2023   HCT 23.6 (L) 11/16/2023   MCV 96.3 11/16/2023   PLT 49 (L) 11/16/2023    Lab Results  Component Value Date   TIBC 357 11/15/2023   TIBC 387 05/28/2023   FERRITIN 65 11/15/2023   FERRITIN 42 05/28/2023   IRONPCTSAT 45 (H) 11/15/2023   IRONPCTSAT 39 05/28/2023     STUDIES: CT Angio Chest Pulmonary  Embolism (PE) W or WO Contrast Result Date: 11/15/2023 CLINICAL DATA:  Shortness of breath on exertion EXAM: CT ANGIOGRAPHY CHEST WITH CONTRAST TECHNIQUE: Multidetector CT imaging of the chest was performed using the standard protocol during bolus administration of intravenous contrast. Multiplanar CT image reconstructions and MIPs were obtained to evaluate the vascular anatomy. RADIATION DOSE REDUCTION: This exam was performed according to the departmental dose-optimization program which includes automated exposure control, adjustment of the mA and/or kV according to patient size and/or use of iterative reconstruction technique. CONTRAST:  75mL OMNIPAQUE IOHEXOL 350 MG/ML SOLN COMPARISON:  06/22/2021 FINDINGS: Cardiovascular: Satisfactory opacification of the pulmonary arteries to the segmental level. No evidence of pulmonary embolism. Central pulmonary arteries are mildly dilated. Thoracic aorta is nonaneurysmal. Scattered atherosclerotic vascular calcifications of the aorta and coronary arteries. Mild cardiomegaly. No pericardial effusion. Mediastinum/Nodes: No  axillary, mediastinal, or hilar lymphadenopathy. Partial thyroidectomy. Trachea within normal limits. Large hiatal hernia. Lungs/Pleura: Very slight mosaic attenuation throughout both lung fields. Lungs are otherwise clear. No pleural effusion or pneumothorax. Upper Abdomen: Cholelithiasis.  No acute findings. Musculoskeletal: Severe dextroscoliotic curvature of the lower thoracic spine. No acute osseous abnormality. No significant chest wall abnormality is evident. Review of the MIP images confirms the above findings. IMPRESSION: 1. No evidence of pulmonary embolism. 2. Very slight mosaic attenuation throughout both lung fields, which can be seen in the setting of small airways disease. 3. Mild cardiomegaly with mild enlargement of the central pulmonary vasculature, which can be seen in the setting of pulmonary arterial hypertension. 4. Large hiatal hernia. 5. Cholelithiasis. 6. Aortic and coronary artery atherosclerosis. Electronically Signed   By: Duanne Guess D.O.   On: 11/15/2023 14:57   MM 3D SCREENING MAMMOGRAM BILATERAL BREAST Result Date: 11/12/2023 CLINICAL DATA:  Screening. EXAM: DIGITAL SCREENING BILATERAL MAMMOGRAM WITH TOMOSYNTHESIS AND CAD TECHNIQUE: Bilateral screening digital craniocaudal and mediolateral oblique mammograms were obtained. Bilateral screening digital breast tomosynthesis was performed. The images were evaluated with computer-aided detection. COMPARISON:  Previous exam(s). ACR Breast Density Category c: The breasts are heterogeneously dense, which may obscure small masses. FINDINGS: There are no findings suspicious for malignancy. IMPRESSION: No mammographic evidence of malignancy. A result letter of this screening mammogram will be mailed directly to the patient. RECOMMENDATION: Screening mammogram in one year. (Code:SM-B-01Y) BI-RADS CATEGORY  1: Negative. Electronically Signed   By: Harmon Pier M.D.   On: 11/12/2023 16:34    Michaelyn Barter, MD   11/16/2023 10:56  AM

## 2023-11-16 NOTE — Plan of Care (Signed)

## 2023-11-17 DIAGNOSIS — D759 Disease of blood and blood-forming organs, unspecified: Secondary | ICD-10-CM | POA: Diagnosis not present

## 2023-11-17 LAB — GLUCOSE, CAPILLARY: Glucose-Capillary: 102 mg/dL — ABNORMAL HIGH (ref 70–99)

## 2023-11-17 LAB — MAGNESIUM: Magnesium: 1.9 mg/dL (ref 1.7–2.4)

## 2023-11-17 LAB — CBC WITH DIFFERENTIAL/PLATELET
Abs Immature Granulocytes: 0.31 10*3/uL — ABNORMAL HIGH (ref 0.00–0.07)
Basophils Absolute: 0 10*3/uL (ref 0.0–0.1)
Basophils Relative: 0 %
Eosinophils Absolute: 0 10*3/uL (ref 0.0–0.5)
Eosinophils Relative: 0 %
HCT: 24 % — ABNORMAL LOW (ref 36.0–46.0)
Hemoglobin: 7.8 g/dL — ABNORMAL LOW (ref 12.0–15.0)
Immature Granulocytes: 6 %
Lymphocytes Relative: 21 %
Lymphs Abs: 1 10*3/uL (ref 0.7–4.0)
MCH: 32.5 pg (ref 26.0–34.0)
MCHC: 32.5 g/dL (ref 30.0–36.0)
MCV: 100 fL (ref 80.0–100.0)
Monocytes Absolute: 0.6 10*3/uL (ref 0.1–1.0)
Monocytes Relative: 12 %
Neutro Abs: 3 10*3/uL (ref 1.7–7.7)
Neutrophils Relative %: 61 %
Platelets: 49 10*3/uL — ABNORMAL LOW (ref 150–400)
RBC: 2.4 MIL/uL — ABNORMAL LOW (ref 3.87–5.11)
RDW: 27.8 % — ABNORMAL HIGH (ref 11.5–15.5)
Smear Review: DECREASED
WBC: 4.9 10*3/uL (ref 4.0–10.5)
nRBC: 1 % — ABNORMAL HIGH (ref 0.0–0.2)

## 2023-11-17 LAB — BASIC METABOLIC PANEL
Anion gap: 5 (ref 5–15)
BUN: 16 mg/dL (ref 8–23)
CO2: 25 mmol/L (ref 22–32)
Calcium: 8.7 mg/dL — ABNORMAL LOW (ref 8.9–10.3)
Chloride: 108 mmol/L (ref 98–111)
Creatinine, Ser: 0.65 mg/dL (ref 0.44–1.00)
GFR, Estimated: >60 mL/min (ref >60)
Glucose, Bld: 101 mg/dL — ABNORMAL HIGH (ref 70–99)
Potassium: 4.2 mmol/L (ref 3.5–5.1)
Sodium: 138 mmol/L (ref 135–145)

## 2023-11-17 NOTE — Progress Notes (Signed)
  PROGRESS NOTE    TANAISHA PITTMAN  ZOX:[id number] DOB: August 06, 1941 DOA: 11/15/2023 PCP: Smitty Cords, DO  216A/[id number]-AA  LOS: 1 day   Brief hospital course:   Assessment & Plan: LAMEISHA SCHUENEMANN is a 83 y.o. female with medical history significant of hypertension, hyperlipidemia presenting with shortness of breath, anemia, thrombocytopenia,?  GI bleeding.  Patient reports approximate 2 weeks of increased work of breathing.  Worsening dyspnea on exertion as well as overall fatigue.  Denies any recent infections.  No recent medication changes.   Anemia Thrombocytopenia Progressive shortness of breath x 2 weeks with noted hemoglobin of 5 on presentation Platelet count in 60s --iron profile wnl, GI deemed unlikely to be GI bleed. --s/p 2u pRBC --Hematology consulted Plan: --monitor Hgb and transfuse to keep >7 --bone marrow biopsy on Monday  Dyspnea Progressive dyspnea x 2-week with noted symptomatic anemia-hemoglobin 5 on presentation.  2/2 anemia.  Improved with blood transfusion. --transfuse PRN  Pre-diabetes --d/c'ed BG checks and SSI  HLD (hyperlipidemia) --resume statin after discharge  Essential hypertension --hold home BP med  Hypokalemia --monitor and supplement PRN   DVT prophylaxis: SCD/Compression stockings Code Status: Full code  Family Communication: husband updated at bedside today Level of care: Med-Surg Dispo:   The patient is from: home Anticipated d/c is to: home Anticipated d/c date is: Monday    Subjective and Interval History:  Pt reported feeling well, able to walk around nurses' station without dyspnea.   Objective: Vitals:   11/16/23 1628 11/16/23 1931 11/17/23 0325 11/17/23 1510  BP: 122/61 (!) 111/54 107/71 123/62  Pulse: 78 85 77 (!) 102  Resp: 18 18 16 18   Temp: 98.4 F (36.9 C) 97.9 F (36.6 C) 97.6 F (36.4 C) 98.8 F (37.1 C)  TempSrc: Oral Oral Oral Oral  SpO2: 100% 98% 96% 97%  Weight:      Height:       No intake  or output data in the 24 hours ending 11/17/23 1858  Filed Weights   11/15/23 1439  Weight: 57.2 kg    Examination:   Constitutional: NAD, AAOx3 HEENT: conjunctivae and lids normal, EOMI CV: No cyanosis.   RESP: normal respiratory effort, on RA Neuro: II - XII grossly intact.   Psych: Normal mood and affect.  Appropriate judgement and reason   Data Reviewed: I have personally reviewed labs and imaging studies  Time spent: 35 minutes  Darlin Priestly, MD [facility] If 7PM-7AM, please contact night-coverage 11/17/2023, 6:58 PM

## 2023-11-17 NOTE — Plan of Care (Signed)

## 2023-11-17 NOTE — Plan of Care (Signed)
 Problem: Education: Goal: Knowledge of General Education information will improve Description: Including pain rating scale, medication(s)/side effects and non-pharmacologic comfort measures 11/17/2023 1935 by Claire Shown, RN Outcome: Progressing 11/17/2023 0544 by Claire Shown, RN Outcome: Progressing   Problem: Health Behavior/Discharge Planning: Goal: Ability to manage health-related needs will improve 11/17/2023 1935 by Claire Shown, RN Outcome: Progressing 11/17/2023 0544 by Claire Shown, RN Outcome: Progressing   Problem: Clinical Measurements: Goal: Ability to maintain clinical measurements within normal limits will improve 11/17/2023 1935 by Claire Shown, RN Outcome: Progressing 11/17/2023 0544 by Claire Shown, RN Outcome: Progressing Goal: Will remain free from infection 11/17/2023 1935 by Claire Shown, RN Outcome: Progressing 11/17/2023 0544 by Claire Shown, RN Outcome: Progressing Goal: Diagnostic test results will improve 11/17/2023 1935 by Claire Shown, RN Outcome: Progressing 11/17/2023 0544 by Claire Shown, RN Outcome: Progressing Goal: Respiratory complications will improve 11/17/2023 1935 by Claire Shown, RN Outcome: Progressing 11/17/2023 0544 by Claire Shown, RN Outcome: Progressing Goal: Cardiovascular complication will be avoided 11/17/2023 1935 by Claire Shown, RN Outcome: Progressing 11/17/2023 0544 by Claire Shown, RN Outcome: Progressing   Problem: Activity: Goal: Risk for activity intolerance will decrease 11/17/2023 1935 by Claire Shown, RN Outcome: Progressing 11/17/2023 0544 by Claire Shown, RN Outcome: Progressing   Problem: Nutrition: Goal: Adequate nutrition will be maintained 11/17/2023 1935 by Claire Shown, RN Outcome: Progressing 11/17/2023 0544 by Claire Shown, RN Outcome: Progressing   Problem: Coping: Goal: Level of anxiety will decrease 11/17/2023 1935 by Claire Shown, RN Outcome: Progressing 11/17/2023 0544 by Claire Shown, RN Outcome: Progressing   Problem:  Elimination: Goal: Will not experience complications related to bowel motility 11/17/2023 1935 by Claire Shown, RN Outcome: Progressing 11/17/2023 0544 by Claire Shown, RN Outcome: Progressing Goal: Will not experience complications related to urinary retention 11/17/2023 1935 by Claire Shown, RN Outcome: Progressing 11/17/2023 0544 by Claire Shown, RN Outcome: Progressing   Problem: Pain Managment: Goal: General experience of comfort will improve and/or be controlled 11/17/2023 1935 by Claire Shown, RN Outcome: Progressing 11/17/2023 0544 by Claire Shown, RN Outcome: Progressing   Problem: Safety: Goal: Ability to remain free from injury will improve 11/17/2023 1935 by Claire Shown, RN Outcome: Progressing 11/17/2023 0544 by Claire Shown, RN Outcome: Progressing   Problem: Skin Integrity: Goal: Risk for impaired skin integrity will decrease 11/17/2023 1935 by Claire Shown, RN Outcome: Progressing 11/17/2023 0544 by Claire Shown, RN Outcome: Progressing   Problem: Education: Goal: Ability to describe self-care measures that may prevent or decrease complications (Diabetes Survival Skills Education) will improve 11/17/2023 1935 by Claire Shown, RN Outcome: Progressing 11/17/2023 0544 by Claire Shown, RN Outcome: Progressing Goal: Individualized Educational Video(s) 11/17/2023 1935 by Claire Shown, RN Outcome: Progressing 11/17/2023 0544 by Claire Shown, RN Outcome: Progressing   Problem: Coping: Goal: Ability to adjust to condition or change in health will improve 11/17/2023 1935 by Claire Shown, RN Outcome: Progressing 11/17/2023 0544 by Claire Shown, RN Outcome: Progressing   Problem: Fluid Volume: Goal: Ability to maintain a balanced intake and output will improve 11/17/2023 1935 by Claire Shown, RN Outcome: Progressing 11/17/2023 0544 by Claire Shown, RN Outcome: Progressing   Problem: Health Behavior/Discharge Planning: Goal: Ability to identify and utilize available resources and services will  improve 11/17/2023 1935 by Claire Shown, RN Outcome: Progressing 11/17/2023 0544 by Claire Shown, RN Outcome: Progressing Goal: Ability to manage health-related needs will improve 11/17/2023 1935 by Claire Shown, RN Outcome: Progressing 11/17/2023 0544 by Claire Shown, RN Outcome: Progressing   Problem: Metabolic: Goal: Ability to maintain appropriate glucose  levels will improve 11/17/2023 1935 by Claire Shown, RN Outcome: Progressing 11/17/2023 0544 by Claire Shown, RN Outcome: Progressing   Problem: Nutritional: Goal: Maintenance of adequate nutrition will improve 11/17/2023 1935 by Claire Shown, RN Outcome: Progressing 11/17/2023 0544 by Claire Shown, RN Outcome: Progressing Goal: Progress toward achieving an optimal weight will improve 11/17/2023 1935 by Claire Shown, RN Outcome: Progressing 11/17/2023 0544 by Claire Shown, RN Outcome: Progressing   Problem: Skin Integrity: Goal: Risk for impaired skin integrity will decrease 11/17/2023 1935 by Claire Shown, RN Outcome: Progressing 11/17/2023 0544 by Claire Shown, RN Outcome: Progressing   Problem: Tissue Perfusion: Goal: Adequacy of tissue perfusion will improve 11/17/2023 1935 by Claire Shown, RN Outcome: Progressing 11/17/2023 0544 by Claire Shown, RN Outcome: Progressing

## 2023-11-17 NOTE — Plan of Care (Signed)

## 2023-11-18 DIAGNOSIS — D759 Disease of blood and blood-forming organs, unspecified: Secondary | ICD-10-CM | POA: Diagnosis not present

## 2023-11-18 LAB — CBC
HCT: 27.4 % — ABNORMAL LOW (ref 36.0–46.0)
Hemoglobin: 8.9 g/dL — ABNORMAL LOW (ref 12.0–15.0)
MCH: 32.6 pg (ref 26.0–34.0)
MCHC: 32.5 g/dL (ref 30.0–36.0)
MCV: 100.4 fL — ABNORMAL HIGH (ref 80.0–100.0)
Platelets: 52 10*3/uL — ABNORMAL LOW (ref 150–400)
RBC: 2.73 MIL/uL — ABNORMAL LOW (ref 3.87–5.11)
RDW: 27.2 % — ABNORMAL HIGH (ref 11.5–15.5)
WBC: 5.8 10*3/uL (ref 4.0–10.5)
nRBC: 0 % (ref 0.0–0.2)

## 2023-11-18 LAB — HEPATIC FUNCTION PANEL
ALT: 11 U/L (ref 0–44)
AST: 15 U/L (ref 15–41)
Albumin: 3.6 g/dL (ref 3.5–5.0)
Alkaline Phosphatase: 34 U/L — ABNORMAL LOW (ref 38–126)
Bilirubin, Direct: 0.2 mg/dL (ref 0.0–0.2)
Indirect Bilirubin: 1.1 mg/dL — ABNORMAL HIGH (ref 0.3–0.9)
Total Bilirubin: 1.3 mg/dL — ABNORMAL HIGH (ref 0.0–1.2)
Total Protein: 6.6 g/dL (ref 6.5–8.1)

## 2023-11-18 LAB — BASIC METABOLIC PANEL
Anion gap: 7 (ref 5–15)
BUN: 15 mg/dL (ref 8–23)
CO2: 24 mmol/L (ref 22–32)
Calcium: 9.3 mg/dL (ref 8.9–10.3)
Chloride: 106 mmol/L (ref 98–111)
Creatinine, Ser: 0.65 mg/dL (ref 0.44–1.00)
GFR, Estimated: >60 mL/min (ref >60)
Glucose, Bld: 107 mg/dL — ABNORMAL HIGH (ref 70–99)
Potassium: 3.9 mmol/L (ref 3.5–5.1)
Sodium: 137 mmol/L (ref 135–145)

## 2023-11-18 LAB — MAGNESIUM: Magnesium: 2.1 mg/dL (ref 1.7–2.4)

## 2023-11-18 LAB — HAPTOGLOBIN: Haptoglobin: 72 mg/dL (ref 41–333)

## 2023-11-18 NOTE — Plan of Care (Signed)
  Problem: Education: Goal: Knowledge of General Education information will improve Description: Including pain rating scale, medication(s)/side effects and non-pharmacologic comfort measures Outcome: Progressing   Problem: Health Behavior/Discharge Planning: Goal: Ability to manage health-related needs will improve Outcome: Progressing   Problem: Clinical Measurements: Goal: Ability to maintain clinical measurements within normal limits will improve Outcome: Progressing Goal: Will remain free from infection Outcome: Progressing Goal: Diagnostic test results will improve Outcome: Progressing Goal: Respiratory complications will improve Outcome: Progressing Goal: Cardiovascular complication will be avoided Outcome: Progressing   Problem: Activity: Goal: Risk for activity intolerance will decrease Outcome: Progressing   Problem: Nutrition: Goal: Adequate nutrition will be maintained Outcome: Progressing   Problem: Coping: Goal: Level of anxiety will decrease Outcome: Progressing   Problem: Elimination: Goal: Will not experience complications related to bowel motility Outcome: Progressing Goal: Will not experience complications related to urinary retention Outcome: Progressing   Problem: Pain Managment: Goal: General experience of comfort will improve and/or be controlled Outcome: Progressing   Problem: Education: Goal: Ability to describe self-care measures that may prevent or decrease complications (Diabetes Survival Skills Education) will improve Outcome: Progressing Goal: Individualized Educational Video(s) Outcome: Progressing

## 2023-11-18 NOTE — Progress Notes (Signed)
  PROGRESS NOTE    Rebecca Mitchell  ZOX:[id number] DOB: 09/10/1941 DOA: 11/15/2023 PCP: Rebecca Cords, DO  [id number]  LOS: 2 days   Brief hospital course:   Assessment & Plan: Rebecca Mitchell is a 83 y.o. female with medical history significant of hypertension, hyperlipidemia presenting with shortness of breath, anemia, thrombocytopenia,?  GI bleeding.  Patient reports approximate 2 weeks of increased work of breathing.  Worsening dyspnea on exertion as well as overall fatigue.  Denies any recent infections.  No recent medication changes.   Anemia Thrombocytopenia Progressive shortness of breath x 2 weeks with noted hemoglobin of 5 on presentation Platelet count in 60s --iron profile wnl, GI deemed unlikely to be GI bleed. --s/p 2u pRBC --Hematology consulted Plan: --monitor Hgb and transfuse to keep >7 --bone marrow biopsy on Monday  Dyspnea Progressive dyspnea x 2-week with noted symptomatic anemia-hemoglobin 5 on presentation.  2/2 anemia.  Improved with blood transfusion. --transfuse PRN  Pre-diabetes --d/c'ed BG checks and SSI  HLD (hyperlipidemia) --resume statin after discharge  Essential hypertension --hold home BP med  Hypokalemia --monitor and supplement PRN   DVT prophylaxis: SCD/Compression stockings Code Status: Full code  Family Communication: husband updated at bedside today Level of care: Med-Surg Dispo:   The patient is from: home Anticipated d/c is to: home Anticipated d/c date is: Monday    Subjective and Interval History:  Pt walked around the nurse's station with no dyspnea.   Objective: Vitals:   11/18/23 0454 11/18/23 0838 11/18/23 0900 11/18/23 1722  BP: 133/68 129/74 130/60 127/75  Pulse: 84 (!) 106 99 90  Resp: 16 16  16   Temp: 98.3 F (36.8 C) 98.3 F (36.8 C) 97.9 F (36.6 C) 98 F (36.7 C)  TempSrc: Axillary Oral Oral Oral  SpO2: 98% 99% 99% 98%  Weight:      Height:        Intake/Output Summary (Last 24  hours) at 11/18/2023 1745 Last data filed at 11/18/2023 1500 Gross per 24 hour  Intake 0 ml  Output --  Net 0 ml    Filed Weights   11/15/23 1439  Weight: 57.2 kg    Examination:   Constitutional: NAD, AAOx3 HEENT: conjunctivae and lids normal, EOMI CV: No cyanosis.   RESP: normal respiratory effort, on RA Neuro: II - XII grossly intact.   Psych: Normal mood and affect.  Appropriate judgement and reason   Data Reviewed: I have personally reviewed labs and imaging studies  Time spent: 25 minutes  Darlin Priestly, MD [facility] If 7PM-7AM, please contact night-coverage 11/18/2023, 5:45 PM

## 2023-11-19 ENCOUNTER — Inpatient Hospital Stay: Payer: Medicare Other | Admitting: Radiology

## 2023-11-19 ENCOUNTER — Other Ambulatory Visit: Payer: Self-pay | Admitting: Internal Medicine

## 2023-11-19 DIAGNOSIS — D539 Nutritional anemia, unspecified: Secondary | ICD-10-CM

## 2023-11-19 DIAGNOSIS — D61818 Other pancytopenia: Secondary | ICD-10-CM | POA: Diagnosis not present

## 2023-11-19 DIAGNOSIS — D759 Disease of blood and blood-forming organs, unspecified: Secondary | ICD-10-CM | POA: Diagnosis not present

## 2023-11-19 DIAGNOSIS — D696 Thrombocytopenia, unspecified: Secondary | ICD-10-CM

## 2023-11-19 HISTORY — PX: IR BONE MARROW BIOPSY & ASPIRATION: IMG5727

## 2023-11-19 LAB — HEMOGLOBIN AND HEMATOCRIT, BLOOD
HCT: 26.6 % — ABNORMAL LOW (ref 36.0–46.0)
Hemoglobin: 8.7 g/dL — ABNORMAL LOW (ref 12.0–15.0)

## 2023-11-19 MED ORDER — MIDAZOLAM HCL 2 MG/2ML IJ SOLN
INTRAMUSCULAR | Status: AC | PRN
  Administered 2023-11-19: 1 mg via INTRAVENOUS
  Administered 2023-11-19: .5 mg via INTRAVENOUS

## 2023-11-19 MED ORDER — HEPARIN SOD (PORK) LOCK FLUSH 100 UNIT/ML IV SOLN
INTRAVENOUS | Status: AC
Start: 2023-11-19 — End: ?
  Filled 2023-11-19: qty 5

## 2023-11-19 MED ORDER — SODIUM CHLORIDE 0.9 % IV SOLN: INTRAVENOUS | Status: DC

## 2023-11-19 MED ORDER — MIDAZOLAM HCL 2 MG/2ML IJ SOLN
INTRAMUSCULAR | Status: AC
  Filled 2023-11-19: qty 2

## 2023-11-19 MED ORDER — FENTANYL CITRATE (PF) 100 MCG/2ML IJ SOLN
INTRAMUSCULAR | Status: AC | PRN
Start: 2023-11-19 — End: 2023-11-19
  Administered 2023-11-19: 25 ug via INTRAVENOUS
  Administered 2023-11-19: 50 ug via INTRAVENOUS

## 2023-11-19 MED ORDER — FENTANYL CITRATE (PF) 100 MCG/2ML IJ SOLN
INTRAMUSCULAR | Status: AC
  Filled 2023-11-19: qty 2

## 2023-11-19 MED ORDER — HEPARIN SOD (PORK) LOCK FLUSH 100 UNIT/ML IV SOLN
500.0000 [IU] | Freq: Once | INTRAVENOUS | Status: AC
  Administered 2023-11-19: 1 [IU] via INTRAVENOUS

## 2023-11-19 MED ORDER — LIDOCAINE 1 % OPTIME INJ - NO CHARGE
10.0000 mL | Freq: Once | INTRAMUSCULAR | Status: AC
  Administered 2023-11-19: 10 mL
  Filled 2023-11-19: qty 10

## 2023-11-19 NOTE — TOC CM/SW Note (Signed)
 Transition of Care [facility]) - Inpatient Brief Assessment   Patient Details  Name: Rebecca Mitchell MRN: [id number] Date of Birth: 02-27-1941  Transition of Care [facility]) CM/SW Contact:    Chapman Fitch, RN Phone Number: 11/19/2023, 11:07 AM   Clinical Narrative:   Transition of Care [facility]) Screening Note   Patient Details  Name: Rebecca Mitchell Date of Birth: 06-19-1941   Transition of Care [facility]) CM/SW Contact:    Chapman Fitch, RN Phone Number: 11/19/2023, 11:07 AM    Transition of Care Department [facility]) has reviewed patient and no TOC needs have been identified at this time. If new patient transition needs arise, please place a TOC consult.Per MD no TOC needs for discharge     Transition of Care Asessment: Insurance and Status: Insurance coverage has been reviewed Patient has primary care physician: Yes     Prior/Current Home Services: No current home services Social Drivers of Health Review: SDOH reviewed no interventions necessary Readmission risk has been reviewed: Yes Transition of care needs: no transition of care needs at this time

## 2023-11-19 NOTE — Discharge Summary (Addendum)
 Physician Discharge Summary   Rebecca Mitchell  female DOB: 14-Mar-1941  ZOX:[id number]  PCP: Smitty Cords, DO  Admit date: 11/15/2023 Discharge date: 11/19/2023  Admitted From: home Disposition:  home Family updated at bedside prior to discharge. CODE STATUS: Full code  Discharge Instructions     No wound care   Complete by: As directed       Hospital Course:  For full details, please see H&P, progress notes, consult notes and ancillary notes.  Briefly,  Rebecca Mitchell is a 83 y.o. female with medical history significant of hypertension, presenting with shortness of breath, found to have Hgb 5.   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  Hgb 10.5 in Sept 2024.  Platelet count in 60s. --iron profile wnl, GI deemed unlikely to be GI bleed. --s/p 2u pRBC --Hematology consulted, and rec bone marrow biopsy which was performed on 11/19/23 prior to discharge. --f/u with Dr. Alena Bills. Oncology to schedule.   Dyspnea, resolved Progressive dyspnea x 2-week 2/2 anemia.  Resolved with blood transfusion.   Pre-diabetes --d/c'ed BG checks and SSI   HLD (hyperlipidemia) --resume statin after discharge   Essential hypertension --home BP meds held during hospitalization with BP mostly wnl.   --resume home Lopressor after discharge.  Home amlodipine and Hyzaar d/c'ed.   Hypokalemia --monitored and supplemented PRN   Unless noted above, medications under "STOP" list are ones pt was not taking PTA.  Discharge Diagnoses:  Principal Problem:   Cytopenia Active Problems:   Anemia   Suspected GI bleed   Dyspnea   Essential hypertension   HLD (hyperlipidemia)   Pre-diabetes   Thrombocytopenia (HCC)   30 Day Unplanned Readmission Risk Score    Flowsheet Row ED to Hosp-Admission (Current) from 11/15/2023 in [facility]  30 Day Unplanned Readmission Risk Score (%) 7.32 Filed at  11/19/2023 0801       This score is the patient's risk of an unplanned readmission within 30 days of being discharged (0 -100%). The score is based on dignosis, age, lab data, medications, orders, and past utilization.   Low:  0-14.9   Medium: 15-21.9   High: 22-29.9   Extreme: 30 and above         Discharge Instructions:  Allergies as of 11/19/2023   No Known Allergies      Medication List     STOP taking these medications    amLODipine 5 MG tablet Commonly known as: NORVASC   loratadine 10 MG tablet Commonly known as: CLARITIN   losartan-hydrochlorothiazide 100-25 MG tablet Commonly known as: HYZAAR   zoledronic acid 5 MG/100ML Soln injection Commonly known as: RECLAST       TAKE these medications    calcium carbonate 100 mg/ml Susp Take by mouth.   co-enzyme Q-10 30 MG capsule Take 30 mg by mouth 3 (three) times daily.   FERROUS SULFATE PO Take 1 tablet by mouth once a week.   FISH OIL PO Take 1 capsule by mouth daily.   Lutein-Zeaxanthin 15-0.7 MG Caps Take 1 capsule by mouth daily.   metoprolol tartrate 25 MG tablet Commonly known as: LOPRESSOR TAKE 1 TABLET BY MOUTH TWICE  DAILY   pravastatin 20 MG tablet Commonly known as: PRAVACHOL TAKE 1 TABLET BY MOUTH DAILY   Probiotic 250 MG Caps Take 250 mg by mouth daily.   vitamin C 1000 MG tablet Take 1,000 mg by mouth  daily.   Vitamin D3 10 MCG (400 UNIT) Caps Take by mouth.         Follow-up Information     Smitty Cords, DO Follow up in 1 week(s).   Specialty: Family Medicine Contact information: [address] [place] [address] [phone]                 No Known Allergies   The results of significant diagnostics from this hospitalization (including imaging, microbiology, ancillary and laboratory) are listed below for reference.   Consultations:   Procedures/Studies: ECHOCARDIOGRAM COMPLETE Result Date: 11/16/2023    ECHOCARDIOGRAM REPORT   Patient  Name:   Rebecca Mitchell Date of Exam: 11/16/2023 Medical Rec #:  [id number]    Height:       63.0 in Accession #:    [id number]   Weight:       126.0 lb Date of Birth:  07-09-1941    BSA:          1.589 m Patient Age:    82 years     BP:           120/63 mmHg Patient Gender: F            HR:           87 bpm. Exam Location:  [facility] Procedure: 2D Echo, Cardiac Doppler and Color Doppler (Both Spectral and Color            Flow Doppler were utilized during procedure). Indications:     Dyspnea R06.00  History:         Patient has no prior history of Echocardiogram examinations.                  Risk Factors:Dyslipidemia.  Sonographer:     Cristela Blue Referring Phys:  [id number] Rebecca Mitchell Diagnosing Phys: Marcina Millard MD IMPRESSIONS  1. Left ventricular ejection fraction, by estimation, is 65 to 70%. The left ventricle has normal function. The left ventricle has no regional wall motion abnormalities. Left ventricular diastolic parameters are consistent with Grade I diastolic dysfunction (impaired relaxation).  2. Right ventricular systolic function is normal. The right ventricular size is normal.  3. The mitral valve is normal in structure. Mild to moderate mitral valve regurgitation. No evidence of mitral stenosis.  4. The aortic valve is normal in structure. Aortic valve regurgitation is not visualized. No aortic stenosis is present.  5. The inferior vena cava is normal in size with greater than 50% respiratory variability, suggesting right atrial pressure of 3 mmHg. FINDINGS  Left Ventricle: Left ventricular ejection fraction, by estimation, is 65 to 70%. The left ventricle has normal function. The left ventricle has no regional wall motion abnormalities. Strain imaging was not performed. The left ventricular internal cavity  size was normal in size. There is no left ventricular hypertrophy. Left ventricular diastolic parameters are consistent with Grade I diastolic dysfunction (impaired relaxation). Right Ventricle:  The right ventricular size is normal. No increase in right ventricular wall thickness. Right ventricular systolic function is normal. Left Atrium: Left atrial size was normal in size. Right Atrium: Right atrial size was normal in size. Pericardium: There is no evidence of pericardial effusion. Mitral Valve: The mitral valve is normal in structure. Mild to moderate mitral valve regurgitation. No evidence of mitral valve stenosis. MV peak gradient, 2.4 mmHg. The mean mitral valve gradient is 1.0 mmHg. Tricuspid Valve: The tricuspid valve is normal in structure. Tricuspid valve regurgitation is  mild . No evidence of tricuspid stenosis. Aortic Valve: The aortic valve is normal in structure. Aortic valve regurgitation is not visualized. No aortic stenosis is present. Aortic valve mean gradient measures 6.0 mmHg. Aortic valve peak gradient measures 11.0 mmHg. Aortic valve area, by VTI measures 2.00 cm. Pulmonic Valve: The pulmonic valve was normal in structure. Pulmonic valve regurgitation is not visualized. No evidence of pulmonic stenosis. Aorta: The aortic root is normal in size and structure. Venous: The inferior vena cava is normal in size with greater than 50% respiratory variability, suggesting right atrial pressure of 3 mmHg. IAS/Shunts: No atrial level shunt detected by color flow Doppler. Additional Comments: 3D imaging was not performed.  LEFT VENTRICLE PLAX 2D LVIDd:         4.00 cm   Diastology LVIDs:         2.20 cm   LV e' medial:    9.90 cm/s LV PW:         1.00 cm   LV E/e' medial:  7.0 LV IVS:        1.20 cm   LV e' lateral:   11.20 cm/s LVOT diam:     2.00 cm   LV E/e' lateral: 6.2 LV SV:         69 LV SV Index:   43 LVOT Area:     3.14 cm  RIGHT VENTRICLE RV Basal diam:  3.40 cm RV Mid diam:    2.50 cm LEFT ATRIUM           Index        RIGHT ATRIUM           Index LA diam:      3.90 cm 2.45 cm/m   RA Area:     15.40 cm LA Vol (A2C): 41.6 ml 26.18 ml/m  RA Volume:   32.30 ml  20.33 ml/m LA Vol  (A4C): 41.0 ml 25.80 ml/m  AORTIC VALVE                     PULMONIC VALVE AV Area (Vmax):    2.03 cm      PR End Diast Vel: 9.24 msec AV Area (Vmean):   1.82 cm AV Area (VTI):     2.00 cm AV Vmax:           165.50 cm/s AV Vmean:          112.000 cm/s AV VTI:            0.344 m AV Peak Grad:      11.0 mmHg AV Mean Grad:      6.0 mmHg LVOT Vmax:         107.00 cm/s LVOT Vmean:        64.800 cm/s LVOT VTI:          0.219 m LVOT/AV VTI ratio: 0.64  AORTA Ao Root diam: 2.80 cm MITRAL VALVE               TRICUSPID VALVE MV Area (PHT): 2.70 cm    TR Peak grad:   37.5 mmHg MV Area VTI:   3.46 cm    TR Vmax:        306.00 cm/s MV Peak grad:  2.4 mmHg MV Mean grad:  1.0 mmHg    SHUNTS MV Vmax:       0.78 m/s    Systemic VTI:  0.22 m MV Vmean:      54.2 cm/s  Systemic Diam: 2.00 cm MV Decel Time: 281 msec MV E velocity: 69.70 cm/s MV A velocity: 89.10 cm/s MV E/A ratio:  0.78 Marcina Millard MD Electronically signed by Marcina Millard MD Signature Date/Time: 11/16/2023/1:15:18 PM    Final    CT Angio Chest Pulmonary Embolism (PE) W or WO Contrast Result Date: 11/15/2023 CLINICAL DATA:  Shortness of breath on exertion EXAM: CT ANGIOGRAPHY CHEST WITH CONTRAST TECHNIQUE: Multidetector CT imaging of the chest was performed using the standard protocol during bolus administration of intravenous contrast. Multiplanar CT image reconstructions and MIPs were obtained to evaluate the vascular anatomy. RADIATION DOSE REDUCTION: This exam was performed according to the departmental dose-optimization program which includes automated exposure control, adjustment of the mA and/or kV according to patient size and/or use of iterative reconstruction technique. CONTRAST:  75mL OMNIPAQUE IOHEXOL 350 MG/ML SOLN COMPARISON:  06/22/2021 FINDINGS: Cardiovascular: Satisfactory opacification of the pulmonary arteries to the segmental level. No evidence of pulmonary embolism. Central pulmonary arteries are mildly dilated. Thoracic aorta  is nonaneurysmal. Scattered atherosclerotic vascular calcifications of the aorta and coronary arteries. Mild cardiomegaly. No pericardial effusion. Mediastinum/Nodes: No axillary, mediastinal, or hilar lymphadenopathy. Partial thyroidectomy. Trachea within normal limits. Large hiatal hernia. Lungs/Pleura: Very slight mosaic attenuation throughout both lung fields. Lungs are otherwise clear. No pleural effusion or pneumothorax. Upper Abdomen: Cholelithiasis.  No acute findings. Musculoskeletal: Severe dextroscoliotic curvature of the lower thoracic spine. No acute osseous abnormality. No significant chest wall abnormality is evident. Review of the MIP images confirms the above findings. IMPRESSION: 1. No evidence of pulmonary embolism. 2. Very slight mosaic attenuation throughout both lung fields, which can be seen in the setting of small airways disease. 3. Mild cardiomegaly with mild enlargement of the central pulmonary vasculature, which can be seen in the setting of pulmonary arterial hypertension. 4. Large hiatal hernia. 5. Cholelithiasis. 6. Aortic and coronary artery atherosclerosis. Electronically Signed   By: Duanne Guess D.O.   On: 11/15/2023 14:57   MM 3D SCREENING MAMMOGRAM BILATERAL BREAST Result Date: 11/12/2023 CLINICAL DATA:  Screening. EXAM: DIGITAL SCREENING BILATERAL MAMMOGRAM WITH TOMOSYNTHESIS AND CAD TECHNIQUE: Bilateral screening digital craniocaudal and mediolateral oblique mammograms were obtained. Bilateral screening digital breast tomosynthesis was performed. The images were evaluated with computer-aided detection. COMPARISON:  Previous exam(s). ACR Breast Density Category c: The breasts are heterogeneously dense, which may obscure small masses. FINDINGS: There are no findings suspicious for malignancy. IMPRESSION: No mammographic evidence of malignancy. A result letter of this screening mammogram will be mailed directly to the patient. RECOMMENDATION: Screening mammogram in one  year. (Code:SM-B-01Y) BI-RADS CATEGORY  1: Negative. Electronically Signed   By: Harmon Pier M.D.   On: 11/12/2023 16:34      Labs: BNP (last 3 results) No results for input(s): "BNP" in the last 8760 hours. Basic Metabolic Panel: Recent Labs  Lab 11/15/23 1442 11/16/23 0507 11/17/23 0640 11/18/23 0804  NA 137 138 138 137  K 3.3* 3.0* 4.2 3.9  CL 103 103 108 106  CO2 23 25 25 24   GLUCOSE 197* 105* 101* 107*  BUN 21 20 16 15   CREATININE 0.93 0.73 0.65 0.65  CALCIUM 9.2 8.9 8.7* 9.3  MG  --   --  1.9 2.1   Liver Function Tests: Recent Labs  Lab 11/15/23 1442 11/16/23 0507 11/16/23 1316 11/18/23 0804  AST 19 15  --  15  ALT 11 10  --  11  ALKPHOS 41 34*  --  34*  BILITOT 1.3* 1.7* 1.5* 1.3*  PROT 6.6 6.0*  --  6.6  ALBUMIN 3.8 3.4*  --  3.6   No results for input(s): "LIPASE", "AMYLASE" in the last 168 hours. No results for input(s): "AMMONIA" in the last 168 hours. CBC: Recent Labs  Lab 11/15/23 1442 11/16/23 0203 11/16/23 0507 11/16/23 1647 11/17/23 0640 11/18/23 0804 11/19/23 0456  WBC 6.3  6.4 6.2 5.6 6.4 4.9 5.8  --   NEUTROABS 4.2 3.9  --  4.3 3.0  --   --   HGB 5.0*  5.0* 7.9* 7.9* 8.1* 7.8* 8.9* 8.7*  HCT 16.5*  16.4* 23.6* 23.6* 24.2* 24.0* 27.4* 26.6*  MCV 120.4*  120.6* 97.1 96.3 96.4 100.0 100.4*  --   PLT 68*  68* 52* 49* 51* 49* 52*  --    Cardiac Enzymes: No results for input(s): "CKTOTAL", "CKMB", "CKMBINDEX", "TROPONINI" in the last 168 hours. BNP: Invalid input(s): "POCBNP" CBG: Recent Labs  Lab 11/15/23 2118 11/16/23 0838 11/16/23 1102 11/16/23 1629 11/17/23 1135  GLUCAP 119* 105* 151* 109* 102*   D-Dimer No results for input(s): "DDIMER" in the last 72 hours. Hgb A1c No results for input(s): "HGBA1C" in the last 72 hours. Lipid Profile No results for input(s): "CHOL", "HDL", "LDLCALC", "TRIG", "CHOLHDL", "LDLDIRECT" in the last 72 hours. Thyroid function studies No results for input(s): "TSH", "T4TOTAL", "T3FREE",  "THYROIDAB" in the last 72 hours.  Invalid input(s): "FREET3" Anemia work up No results for input(s): "VITAMINB12", "FOLATE", "FERRITIN", "TIBC", "IRON", "RETICCTPCT" in the last 72 hours. Urinalysis    Component Value Date/Time   BILIRUBINUR Negative 06/21/2022 1544   PROTEINUR Positive (A) 06/21/2022 1544   UROBILINOGEN 0.2 06/21/2022 1544   NITRITE Negative 06/21/2022 1544   LEUKOCYTESUR Moderate (2+) (A) 06/21/2022 1544   Sepsis Labs Recent Labs  Lab 11/16/23 0507 11/16/23 1647 11/17/23 0640 11/18/23 0804  WBC 5.6 6.4 4.9 5.8   Microbiology No results found for this or any previous visit (from the past 240 hours).   Total time spend on discharging this patient, including the last patient exam, discussing the hospital stay, instructions for ongoing care as it relates to all pertinent caregivers, as well as preparing the medical discharge records, prescriptions, and/or referrals as applicable, is 35 minutes.    Darlin Priestly, MD  [facility] 11/19/2023, 10:51 AM

## 2023-11-19 NOTE — Plan of Care (Signed)

## 2023-11-19 NOTE — Care Management Important Message (Signed)
 Important Message  Patient Details  Name: Rebecca Mitchell MRN: [id number] Date of Birth: October 12, 1940   Important Message Given:  Yes - Medicare IM     Cristela Blue, CMA 11/19/2023, 10:58 AM

## 2023-11-19 NOTE — Progress Notes (Signed)
 Patient's hemoglobin remained stable at 8.7 today.  LDH normal, reticulocyte low for the degree of anemia, haptoglobin normal at 72.  Peripheral smear showing schistocytes however labs are not consistent with hemolytic anemia.  Status post bone marrow biopsy on 11/19/2023.  Patient will be discharged today.  Will schedule a follow-up next week at the [facility] to discuss the bone marrow biopsy results.  Will schedule for lab only appointment on Thursday to monitor her blood counts.

## 2023-11-19 NOTE — Procedures (Signed)
 Interventional Radiology Procedure Note  Procedure: CT guided aspirate and core biopsy of left iliac bone Complications: None Recommendations: - Bedrest supine x 1 hrs - Hydrocodone PRN  Pain - Follow biopsy results  Signed,  Sterling Big, MD

## 2023-11-20 ENCOUNTER — Telehealth: Payer: Self-pay

## 2023-11-20 ENCOUNTER — Ambulatory Visit (INDEPENDENT_AMBULATORY_CARE_PROVIDER_SITE_OTHER): Admitting: Family Medicine

## 2023-11-20 ENCOUNTER — Encounter: Payer: Self-pay | Admitting: Family Medicine

## 2023-11-20 VITALS — BP 138/62 | HR 78 | Ht 63.0 in | Wt 125.0 lb

## 2023-11-20 DIAGNOSIS — I2510 Atherosclerotic heart disease of native coronary artery without angina pectoris: Secondary | ICD-10-CM | POA: Insufficient documentation

## 2023-11-20 DIAGNOSIS — I10 Essential (primary) hypertension: Secondary | ICD-10-CM | POA: Diagnosis not present

## 2023-11-20 DIAGNOSIS — D649 Anemia, unspecified: Secondary | ICD-10-CM | POA: Diagnosis not present

## 2023-11-20 DIAGNOSIS — D696 Thrombocytopenia, unspecified: Secondary | ICD-10-CM

## 2023-11-20 NOTE — Progress Notes (Signed)
 Subjective:    Patient ID: Rebecca Mitchell, female    DOB: 08/24/1941, 83 y.o.   MRN: [id number]  Rebecca Mitchell is a 83 y.o. female presenting on 11/20/2023 for Anemia   HPI  Discussed the use of AI scribe software for clinical note transcription with the patient, who gave verbal consent to proceed.  History of Present Illness     HOSPITAL FOLLOW-UP VISIT  Hospital/Location: [facility] Date of Admission: 11/15/23 Date of Discharge: 11/19/23 Transitions of care telephone call: Not completed  Reason for Admission: Symptomatic Anemia, Dyspnea  - Hospital H&P and Discharge Summary have been reviewed - Patient presents today 1 day after recent hospitalization. Brief summary of recent course, patient had symptoms of dyspnea, hospitalized, found to have initial Hemoglobin 5.1 HCT 16.7, treated with transfusion 2 units blood.  Hematology consultation was completed. A bone marrow biopsy was performed on November 19, 2023, to determine the cause of her anemia. She has a history of a similar anemia episode, previously treated with transfusions, though the cause was not clearly diagnosed. No recent bleeding and normal iron levels were reported, indicating that her anemia is not due to iron deficiency. No sign of GI bleeding identified.  During her hospital stay, her blood pressure was noted to be low, leading to the temporary discontinuation of her blood pressure medications, including amlodipine. Her blood pressure was recorded as low as 116/61 mmHg. Off of medication now  She is waiting on results of Bone Marrow Biopsy. She has upcoming labs as well x 2 on 3/6 and 3/11 with upcoming outpatient Hematology [facility] Appointment 11/27/23  She feels much better overall. No further dyspnea. No new symptoms or complaints.  Denies chest pain, dizziness lightheadedness No sign of bleeding or dark stool  - New medications on discharge: none - Changes to current meds on discharge: Holding both BP medications  Amlodipine /  Lisinopril-HCTZ  I have reviewed the discharge medication list, and have reconciled the current and discharge medications today.   Current Outpatient Medications:    amLODipine (NORVASC) 5 MG tablet, Take 5 mg by mouth daily., Disp: , Rfl:    Ascorbic Acid (VITAMIN C) 1000 MG tablet, Take 1,000 mg by mouth daily., Disp: , Rfl:    calcium carbonate 100 mg/ml SUSP, Take by mouth., Disp: , Rfl:    Cholecalciferol (VITAMIN D3) 10 MCG (400 UNIT) CAPS, Take by mouth., Disp: , Rfl:    co-enzyme Q-10 30 MG capsule, Take 30 mg by mouth 3 (three) times daily., Disp: , Rfl:    FERROUS SULFATE PO, Take 1 tablet by mouth once a week., Disp: , Rfl:    Lutein-Zeaxanthin 15-0.7 MG CAPS, Take 1 capsule by mouth daily., Disp: , Rfl:    metoprolol tartrate (LOPRESSOR) 25 MG tablet, TAKE 1 TABLET BY MOUTH TWICE  DAILY, Disp: 200 tablet, Rfl: 1   Omega-3 Fatty Acids (FISH OIL PO), Take 1 capsule by mouth daily., Disp: , Rfl:    pravastatin (PRAVACHOL) 20 MG tablet, TAKE 1 TABLET BY MOUTH DAILY, Disp: 100 tablet, Rfl: 1   Saccharomyces boulardii (PROBIOTIC) 250 MG CAPS, Take 250 mg by mouth daily., Disp: , Rfl:   ------------------------------------------------------------------------- Social History   Tobacco Use   Smoking status: Never   Smokeless tobacco: Never  Vaping Use   Vaping status: Never Used  Substance Use Topics   Alcohol use: No    Alcohol/week: 0.0 standard drinks of alcohol   Drug use: No    Review of Systems  Per HPI unless specifically indicated above     Objective:    BP 138/62 (BP Location: Left Arm, Cuff Size: Normal)   Pulse 78   Ht 5\' 3"  (1.6 m)   Wt 125 lb (56.7 kg)   SpO2 97%   BMI 22.14 kg/m   Wt Readings from Last 3 Encounters:  11/20/23 125 lb (56.7 kg)  11/15/23 126 lb (57.2 kg)  06/04/23 141 lb 3.2 oz (64 kg)    Physical Exam Vitals and nursing note reviewed.  Constitutional:      General: She is not in acute distress.    Appearance: She is well-developed.  She is not diaphoretic.     Comments: Well-appearing, comfortable, cooperative  HENT:     Head: Normocephalic and atraumatic.  Eyes:     General:        Right eye: No discharge.        Left eye: No discharge.     Conjunctiva/sclera: Conjunctivae normal.  Neck:     Thyroid: No thyromegaly.  Cardiovascular:     Rate and Rhythm: Normal rate and regular rhythm.     Heart sounds: Normal heart sounds. No murmur heard. Pulmonary:     Effort: Pulmonary effort is normal. No respiratory distress.     Breath sounds: Normal breath sounds. No wheezing or rales.  Musculoskeletal:        General: Normal range of motion.     Cervical back: Normal range of motion and neck supple.  Lymphadenopathy:     Cervical: No cervical adenopathy.  Skin:    General: Skin is warm and dry.     Findings: No erythema or rash.  Neurological:     Mental Status: She is alert and oriented to Mitchell, place, and time.  Psychiatric:        Behavior: Behavior normal.     Comments: Well groomed, good eye contact, normal speech and thoughts       Results for orders placed or performed during the hospital encounter of 11/15/23  Comprehensive metabolic panel   Collection Time: 11/15/23  2:42 PM  Result Value Ref Range   Sodium 137 135 - 145 mmol/L   Potassium 3.3 (L) 3.5 - 5.1 mmol/L   Chloride 103 98 - 111 mmol/L   CO2 23 22 - 32 mmol/L   Glucose, Bld 197 (H) 70 - 99 mg/dL   BUN 21 8 - 23 mg/dL   Creatinine, Ser 1.61 0.44 - 1.00 mg/dL   Calcium 9.2 8.9 - 09.6 mg/dL   Total Protein 6.6 6.5 - 8.1 g/dL   Albumin 3.8 3.5 - 5.0 g/dL   AST 19 15 - 41 U/L   ALT 11 0 - 44 U/L   Alkaline Phosphatase 41 38 - 126 U/L   Total Bilirubin 1.3 (H) 0.0 - 1.2 mg/dL   GFR, Estimated >04 >54 mL/min   Anion gap 11 5 - 15  CBC   Collection Time: 11/15/23  2:42 PM  Result Value Ref Range   WBC 6.4 4.0 - 10.5 K/uL   RBC 1.36 (L) 3.87 - 5.11 MIL/uL   Hemoglobin 5.0 (L) 12.0 - 15.0 g/dL   HCT 09.8 (L) 11.9 - 14.7 %   MCV  120.6 (H) 80.0 - 100.0 fL   MCH 36.8 (H) 26.0 - 34.0 pg   MCHC 30.5 30.0 - 36.0 g/dL   RDW 82.9 (H) 56.2 - 13.0 %   Platelets 68 (L) 150 - 400 K/uL   nRBC 1.7 (H)  0.0 - 0.2 %  Technologist smear review   Collection Time: 11/15/23  2:42 PM  Result Value Ref Range   WBC MORPHOLOGY Abnormal lymphocytes present    RBC MORPHOLOGY POLYCHROMASIA PRESENT    Plt Morphology PLATELETS APPEAR DECREASED    Clinical Information thrombocytopenia   CBC with Differential/Platelet   Collection Time: 11/15/23  2:42 PM  Result Value Ref Range   WBC 6.3 4.0 - 10.5 K/uL   RBC 1.37 (L) 3.87 - 5.11 MIL/uL   Hemoglobin 5.0 (L) 12.0 - 15.0 g/dL   HCT 13.2 (L) 44.0 - 10.2 %   MCV 120.4 (H) 80.0 - 100.0 fL   MCH 36.5 (H) 26.0 - 34.0 pg   MCHC 30.3 30.0 - 36.0 g/dL   RDW 72.5 (H) 36.6 - 44.0 %   Platelets 68 (L) 150 - 400 K/uL   nRBC 2.4 (H) 0.0 - 0.2 %   Neutrophils Relative % 67 %   Neutro Abs 4.2 1.7 - 7.7 K/uL   Lymphocytes Relative 16 %   Lymphs Abs 1.0 0.7 - 4.0 K/uL   Monocytes Relative 11 %   Monocytes Absolute 0.7 0.1 - 1.0 K/uL   Eosinophils Relative 0 %   Eosinophils Absolute 0.0 0.0 - 0.5 K/uL   Basophils Relative 0 %   Basophils Absolute 0.0 0.0 - 0.1 K/uL   WBC Morphology Abnormal lymphocytes present    RBC Morphology MIXED RBC POPULATION    Smear Review PLATELETS APPEAR DECREASED    Immature Granulocytes 6 %   Abs Immature Granulocytes 0.39 (H) 0.00 - 0.07 K/uL   Schistocytes PRESENT    Polychromasia PRESENT   Pathologist smear review   Collection Time: 11/15/23  2:42 PM  Result Value Ref Range   Path Review      POIKILOCYTOSIS WITH SCHISTOCYTES, MACROCYTIC ANEMIA, AND THROMBOCYTOPENIA.  Type and screen [facility]   Collection Time: 11/15/23  2:42 PM  Result Value Ref Range   ABO/RH(D) O POS    Antibody Screen NEG    Sample Expiration 11/18/2023,2359    Unit Number [id number]    Blood Component Type RED CELLS,LR    Unit division 00    Status of Unit  ISSUED,FINAL    Transfusion Status OK TO TRANSFUSE    Crossmatch Result Compatible    Unit Number [id number]    Blood Component Type RED CELLS,LR    Unit division 00    Status of Unit ISSUED,FINAL    Transfusion Status OK TO TRANSFUSE    Crossmatch Result      Compatible Performed at [facility], [address], [place], [place] [address]   BPAM [facility]   Collection Time: 11/15/23  2:42 PM  Result Value Ref Range   ISSUE DATE / TIME 160109323557    Blood Product Unit Number [id number]    PRODUCT CODE [id number]    Unit Type and Rh 5100    Blood Product Expiration Date 761607371062    ISSUE DATE / TIME 694854627035    Blood Product Unit Number [id number]    PRODUCT CODE [id number]    Unit Type and Rh 5100    Blood Product Expiration Date 101751025852   Prepare RBC (crossmatch)   Collection Time: 11/15/23  4:04 PM  Result Value Ref Range   Order Confirmation      ORDER PROCESSED BY BLOOD BANK Performed at [facility], [address] [place], [place] [address]   ABO/Rh   Collection Time: 11/15/23  4:14 PM  Result Value Ref Range   ABO/RH(D)      O POS Performed at [facility], [address] [place], [place] [address]   Vitamin B12   Collection Time: 11/15/23  5:58 PM  Result Value Ref Range   Vitamin B-12 457 180 - 914 pg/mL  Folate   Collection Time: 11/15/23  5:58 PM  Result Value Ref Range   Folate 13.6 >5.9 ng/mL  Lactate dehydrogenase   Collection Time: 11/15/23  5:58 PM  Result Value Ref Range   LDH 165 98 - 192 U/L  Haptoglobin   Collection Time: 11/15/23  5:58 PM  Result Value Ref Range   Haptoglobin 72 41 - 333 mg/dL  Ferritin (Iron Binding Protein)   Collection Time: 11/15/23  5:58 PM  Result Value Ref Range   Ferritin 65 11 - 307 ng/mL  Iron and TIBC   Collection Time: 11/15/23  5:58 PM  Result Value Ref Range   Iron 160 28 - 170 ug/dL   TIBC 562 130 - 865 ug/dL   Saturation Ratios 45 (H) 10.4 - 31.8 %    UIBC 197 ug/dL  Retic Panel   Collection Time: 11/15/23  5:58 PM  Result Value Ref Range   Retic Ct Pct 2.5 0.4 - 3.1 %   RBC. 1.34 (L) 3.87 - 5.11 MIL/uL   Retic Count, Absolute 33.1 19.0 - 186.0 K/uL   Immature Retic Fract 24.7 (H) 2.3 - 15.9 %   Reticulocyte Hemoglobin 32.1 >27.9 pg  Glucose, capillary   Collection Time: 11/15/23  9:18 PM  Result Value Ref Range   Glucose-Capillary 119 (H) 70 - 99 mg/dL  CBC with Differential/Platelet   Collection Time: 11/16/23  2:03 AM  Result Value Ref Range   WBC 6.2 4.0 - 10.5 K/uL   RBC 2.43 (L) 3.87 - 5.11 MIL/uL   Hemoglobin 7.9 (L) 12.0 - 15.0 g/dL   HCT 78.4 (L) 69.6 - 29.5 %   MCV 97.1 80.0 - 100.0 fL   MCH 32.5 26.0 - 34.0 pg   MCHC 33.5 30.0 - 36.0 g/dL   RDW 28.4 (H) 13.2 - 44.0 %   Platelets 52 (L) 150 - 400 K/uL   nRBC 1.9 (H) 0.0 - 0.2 %   Neutrophils Relative % 64 %   Neutro Abs 3.9 1.7 - 7.7 K/uL   Lymphocytes Relative 15 %   Lymphs Abs 1.0 0.7 - 4.0 K/uL   Monocytes Relative 13 %   Monocytes Absolute 0.8 0.1 - 1.0 K/uL   Eosinophils Relative 0 %   Eosinophils Absolute 0.0 0.0 - 0.5 K/uL   Basophils Relative 0 %   Basophils Absolute 0.0 0.0 - 0.1 K/uL   WBC Morphology Abnormal lymphocytes present    RBC Morphology MIXED RBC POPULATION    Smear Review PLATELETS APPEAR DECREASED    Immature Granulocytes 8 %   Abs Immature Granulocytes 0.52 (H) 0.00 - 0.07 K/uL   Polychromasia PRESENT   CBC   Collection Time: 11/16/23  5:07 AM  Result Value Ref Range   WBC 5.6 4.0 - 10.5 K/uL   RBC 2.45 (L) 3.87 - 5.11 MIL/uL   Hemoglobin 7.9 (L) 12.0 - 15.0 g/dL   HCT 10.2 (L) 72.5 - 36.6 %   MCV 96.3 80.0 - 100.0 fL   MCH 32.2 26.0 - 34.0 pg   MCHC 33.5 30.0 - 36.0 g/dL   RDW 44.0 (H) 34.7 - 42.5 %   Platelets 49 (L) 150 - 400 K/uL  nRBC 1.8 (H) 0.0 - 0.2 %  Comprehensive metabolic panel   Collection Time: 11/16/23  5:07 AM  Result Value Ref Range   Sodium 138 135 - 145 mmol/L   Potassium 3.0 (L) 3.5 - 5.1 mmol/L    Chloride 103 98 - 111 mmol/L   CO2 25 22 - 32 mmol/L   Glucose, Bld 105 (H) 70 - 99 mg/dL   BUN 20 8 - 23 mg/dL   Creatinine, Ser 1.61 0.44 - 1.00 mg/dL   Calcium 8.9 8.9 - 09.6 mg/dL   Total Protein 6.0 (L) 6.5 - 8.1 g/dL   Albumin 3.4 (L) 3.5 - 5.0 g/dL   AST 15 15 - 41 U/L   ALT 10 0 - 44 U/L   Alkaline Phosphatase 34 (L) 38 - 126 U/L   Total Bilirubin 1.7 (H) 0.0 - 1.2 mg/dL   GFR, Estimated >04 >54 mL/min   Anion gap 10 5 - 15  ECHOCARDIOGRAM COMPLETE   Collection Time: 11/16/23  7:57 AM  Result Value Ref Range   Weight 2,016 oz   Height 63 in   BP 120/63 mmHg   Ao pk vel 1.66 m/s   AV Area VTI 2.00 cm2   AR max vel 2.03 cm2   AV Mean grad 6.0 mmHg   AV Peak grad 11.0 mmHg   S' Lateral 2.20 cm   AV Area mean vel 1.82 cm2   Area-P 1/2 2.70 cm2   MV VTI 3.46 cm2   Est EF 65 - 70%   Glucose, capillary   Collection Time: 11/16/23  8:38 AM  Result Value Ref Range   Glucose-Capillary 105 (H) 70 - 99 mg/dL  Glucose, capillary   Collection Time: 11/16/23 11:02 AM  Result Value Ref Range   Glucose-Capillary 151 (H) 70 - 99 mg/dL  Bilirubin, fractionated(tot/dir/indir)   Collection Time: 11/16/23  1:16 PM  Result Value Ref Range   Total Bilirubin 1.5 (H) 0.0 - 1.2 mg/dL   Bilirubin, Direct 0.2 0.0 - 0.2 mg/dL   Indirect Bilirubin 1.3 (H) 0.3 - 0.9 mg/dL  Lactate dehydrogenase   Collection Time: 11/16/23  1:16 PM  Result Value Ref Range   LDH 148 98 - 192 U/L  Glucose, capillary   Collection Time: 11/16/23  4:29 PM  Result Value Ref Range   Glucose-Capillary 109 (H) 70 - 99 mg/dL  CBC with Differential/Platelet   Collection Time: 11/16/23  4:47 PM  Result Value Ref Range   WBC 6.4 4.0 - 10.5 K/uL   RBC 2.51 (L) 3.87 - 5.11 MIL/uL   Hemoglobin 8.1 (L) 12.0 - 15.0 g/dL   HCT 09.8 (L) 11.9 - 14.7 %   MCV 96.4 80.0 - 100.0 fL   MCH 32.3 26.0 - 34.0 pg   MCHC 33.5 30.0 - 36.0 g/dL   RDW 82.9 (H) 56.2 - 13.0 %   Platelets 51 (L) 150 - 400 K/uL   nRBC 1.3 (H) 0.0 -  0.2 %   Neutrophils Relative % 67 %   Neutro Abs 4.3 1.7 - 7.7 K/uL   Lymphocytes Relative 17 %   Lymphs Abs 1.1 0.7 - 4.0 K/uL   Monocytes Relative 11 %   Monocytes Absolute 0.7 0.1 - 1.0 K/uL   Eosinophils Relative 0 %   Eosinophils Absolute 0.0 0.0 - 0.5 K/uL   Basophils Relative 0 %   Basophils Absolute 0.0 0.0 - 0.1 K/uL   Immature Granulocytes 5 %   Abs Immature Granulocytes 0.31 (H) 0.00 -  0.07 K/uL  CBC with Differential/Platelet   Collection Time: 11/17/23  6:40 AM  Result Value Ref Range   WBC 4.9 4.0 - 10.5 K/uL   RBC 2.40 (L) 3.87 - 5.11 MIL/uL   Hemoglobin 7.8 (L) 12.0 - 15.0 g/dL   HCT 27.2 (L) 53.6 - 64.4 %   MCV 100.0 80.0 - 100.0 fL   MCH 32.5 26.0 - 34.0 pg   MCHC 32.5 30.0 - 36.0 g/dL   RDW 03.4 (H) 74.2 - 59.5 %   Platelets 49 (L) 150 - 400 K/uL   nRBC 1.0 (H) 0.0 - 0.2 %   Neutrophils Relative % 61 %   Neutro Abs 3.0 1.7 - 7.7 K/uL   Lymphocytes Relative 21 %   Lymphs Abs 1.0 0.7 - 4.0 K/uL   Monocytes Relative 12 %   Monocytes Absolute 0.6 0.1 - 1.0 K/uL   Eosinophils Relative 0 %   Eosinophils Absolute 0.0 0.0 - 0.5 K/uL   Basophils Relative 0 %   Basophils Absolute 0.0 0.0 - 0.1 K/uL   WBC Morphology MORPHOLOGY UNREMARKABLE    Smear Review PLATELETS APPEAR DECREASED    Immature Granulocytes 6 %   Abs Immature Granulocytes 0.31 (H) 0.00 - 0.07 K/uL   Schistocytes PRESENT    Tear Drop Cells PRESENT    Polychromasia PRESENT   Basic metabolic panel   Collection Time: 11/17/23  6:40 AM  Result Value Ref Range   Sodium 138 135 - 145 mmol/L   Potassium 4.2 3.5 - 5.1 mmol/L   Chloride 108 98 - 111 mmol/L   CO2 25 22 - 32 mmol/L   Glucose, Bld 101 (H) 70 - 99 mg/dL   BUN 16 8 - 23 mg/dL   Creatinine, Ser 6.38 0.44 - 1.00 mg/dL   Calcium 8.7 (L) 8.9 - 10.3 mg/dL   GFR, Estimated >75 >64 mL/min   Anion gap 5 5 - 15  Magnesium   Collection Time: 11/17/23  6:40 AM  Result Value Ref Range   Magnesium 1.9 1.7 - 2.4 mg/dL  Glucose, capillary    Collection Time: 11/17/23 11:35 AM  Result Value Ref Range   Glucose-Capillary 102 (H) 70 - 99 mg/dL   Comment 1 Notify RN    Comment 2 Document in Chart   Basic metabolic panel   Collection Time: 11/18/23  8:04 AM  Result Value Ref Range   Sodium 137 135 - 145 mmol/L   Potassium 3.9 3.5 - 5.1 mmol/L   Chloride 106 98 - 111 mmol/L   CO2 24 22 - 32 mmol/L   Glucose, Bld 107 (H) 70 - 99 mg/dL   BUN 15 8 - 23 mg/dL   Creatinine, Ser 3.32 0.44 - 1.00 mg/dL   Calcium 9.3 8.9 - 95.1 mg/dL   GFR, Estimated >88 >41 mL/min   Anion gap 7 5 - 15  Magnesium   Collection Time: 11/18/23  8:04 AM  Result Value Ref Range   Magnesium 2.1 1.7 - 2.4 mg/dL  CBC   Collection Time: 11/18/23  8:04 AM  Result Value Ref Range   WBC 5.8 4.0 - 10.5 K/uL   RBC 2.73 (L) 3.87 - 5.11 MIL/uL   Hemoglobin 8.9 (L) 12.0 - 15.0 g/dL   HCT 66.0 (L) 63.0 - 16.0 %   MCV 100.4 (H) 80.0 - 100.0 fL   MCH 32.6 26.0 - 34.0 pg   MCHC 32.5 30.0 - 36.0 g/dL   RDW 10.9 (H) 32.3 - 55.7 %  Platelets 52 (L) 150 - 400 K/uL   nRBC 0.0 0.0 - 0.2 %  Hepatic function panel   Collection Time: 11/18/23  8:04 AM  Result Value Ref Range   Total Protein 6.6 6.5 - 8.1 g/dL   Albumin 3.6 3.5 - 5.0 g/dL   AST 15 15 - 41 U/L   ALT 11 0 - 44 U/L   Alkaline Phosphatase 34 (L) 38 - 126 U/L   Total Bilirubin 1.3 (H) 0.0 - 1.2 mg/dL   Bilirubin, Direct 0.2 0.0 - 0.2 mg/dL   Indirect Bilirubin 1.1 (H) 0.3 - 0.9 mg/dL  Hemoglobin and hematocrit, blood   Collection Time: 11/19/23  4:56 AM  Result Value Ref Range   Hemoglobin 8.7 (L) 12.0 - 15.0 g/dL   HCT 16.1 (L) 09.6 - 04.5 %      Assessment & Plan:   Problem List Items Addressed This Visit     Essential hypertension   Relevant Medications   amLODipine (NORVASC) 5 MG tablet   Thrombocytopenia (HCC)   Other Visit Diagnoses       Symptomatic anemia    -  Primary       Symptomatic Anemia Thrombocytopenia Hypertension Hospitalized for symptomatic anemia with critically  low hemoglobin. Received transfusion with symptom improvement. Etiology unclear; normal iron levels suggest functional production issue. Bone marrow biopsy results pending. Hematology involved. No evidence of IDA  Today now symptoms have resolved after infusion. Last lab yesterday showed much improved Hgb 8.9  Restart Amlodipine 5mg  daily, today BP SBP 140. Asymptomatic Remain off Lisionpril-hydrochlorothiazide, will adjust meds slowly Keep improving nutrition No lab today since just had labs drawn yesterday Keep upcoming appointments Follow-up with hematologist for bone marrow biopsy results and treatment plan.  Return criteria reviewed if acute anemia again or new symptoms   No orders of the defined types were placed in this encounter.   Follow up plan: Return if symptoms worsen or fail to improve.   Saralyn Pilar, DO [facility] 11/20/2023, 11:29 AM

## 2023-11-20 NOTE — Transitions of Care (Post Inpatient/ED Visit) (Signed)
 11/20/2023  Name: Rebecca Mitchell MRN: [id number] DOB: Oct 11, 1940  Today's TOC FU Call Status: Today's TOC FU Call Status:: Successful TOC FU Call Completed TOC FU Call Complete Date: 11/20/23 Patient's Name and Date of Birth confirmed.  Transition Care Management Follow-up Telephone Call Date of Discharge: 11/19/23 Discharge Facility: [facility]) Type of Discharge: Inpatient Admission Primary Inpatient Discharge Diagnosis:: Cytopenia How have you been since you were released from the hospital?: Better Any questions or concerns?: No  Items Reviewed: Did you receive and understand the discharge instructions provided?: Yes Medications obtained,verified, and reconciled?: Yes (Medications Reviewed) Any new allergies since your discharge?: No Dietary orders reviewed?: Yes Type of Diet Ordered:: Reg Heart Healthy Do you have support at home?: Yes People in Home: spouse Name of Support/Comfort Primary Source: Husband Fayrene Fearing  Medications Reviewed Today: Medications Reviewed Today     Reviewed by Johnnette Barrios, RN (Registered Nurse) on 11/20/23 at 1438  Med List Status: <None>   Medication Order Taking? Sig Documenting Provider Last Dose Status Informant  amLODipine (NORVASC) 5 MG tablet [id number] Yes Take 5 mg by mouth daily. [provider] Taking Active   Ascorbic Acid (VITAMIN C) 1000 MG tablet [id number] Yes Take 1,000 mg by mouth daily. [provider] Taking Active Self  calcium carbonate 100 mg/ml SUSP [id number] Yes Take by mouth. [provider] Taking Active Self  Cholecalciferol (VITAMIN D3) 10 MCG (400 UNIT) CAPS [id number] Yes Take by mouth. [provider] Taking Active Self  co-enzyme Q-10 30 MG capsule [id number] Yes Take 30 mg by mouth 3 (three) times daily. [provider] Taking Active Self  FERROUS SULFATE PO [id number] Yes Take 1 tablet by mouth once a week. [provider] Taking Active  Self  Lutein-Zeaxanthin 15-0.7 MG CAPS [id number] Yes Take 1 capsule by mouth daily. [provider] Taking Active Self  metoprolol tartrate (LOPRESSOR) 25 MG tablet [id number] Yes TAKE 1 TABLET BY MOUTH TWICE  DAILY Smitty Cords, DO Taking Active Self  Omega-3 Fatty Acids (FISH OIL PO) [id number] Yes Take 1 capsule by mouth daily. [provider] Taking Active Self  pravastatin (PRAVACHOL) 20 MG tablet [id number] Yes TAKE 1 TABLET BY MOUTH DAILY Karamalegos, Netta Neat, DO Taking Active Self  Saccharomyces boulardii (PROBIOTIC) 250 MG CAPS [id number] Yes Take 250 mg by mouth daily. [provider] Taking Active Self          Medication reconciliation / review completed based on most recent discharge summary and EHR medication list. Confirmed patient is taking all newly prescribed medications as instructed (any discrepancies are noted in review section)   Patient is aware of any changes to and / or  any dosage adjustments to medication regimen. Patient denies questions at this time and reports no barriers to medication adherence.   Changes noted  Medication List       STOP taking these medications     amLODipine 5 MG tablet Commonly known as: NORVASC    loratadine 10 MG tablet Commonly known as: CLARITIN    losartan-hydrochlorothiazide 100-25 MG tablet Commonly known as: HYZAAR    zoledronic acid 5 MG/100ML Soln injection Commonly known as: RECLAST       Home Care and Equipment/Supplies: Were Home Health Services Ordered?: No Any new equipment or medical supplies ordered?: No  Functional Questionnaire: Do you need assistance with bathing/showering or dressing?: No Do you need assistance with meal preparation?: No Do you need assistance with eating?: No Do you  have difficulty maintaining continence: No Do you need assistance with getting out of bed/getting out of a chair/moving?: No Do you have difficulty managing or taking your  medications?: No  Follow up appointments reviewed: PCP Follow-up appointment confirmed?: Yes Date of PCP follow-up appointment?: 11/20/23 Follow-up Provider: Idolina Primer, Netta Neat, DO Specialist Hospital Follow-up appointment confirmed?: Yes Date of Specialist follow-up appointment?: 11/22/23 Follow-Up Specialty Provider:: [facility] [place] Do you need transportation to your follow-up appointment?: No (Husband Drives) Do you understand care options if your condition(s) worsen?: Yes-patient verbalized understanding  SDOH Interventions Today    Flowsheet Row Most Recent Value  SDOH Interventions   Food Insecurity Interventions Intervention Not Indicated  Housing Interventions Intervention Not Indicated  Transportation Interventions Intervention Not Indicated, Patient Resources (Friends/Family)  Utilities Interventions Intervention Not Indicated      Interventions Today    Flowsheet Row Most Recent Value  Chronic Disease   Chronic disease during today's visit Other  General Interventions   General Interventions Discussed/Reviewed General Interventions Discussed, General Interventions Reviewed, Doctor Visits  Doctor Visits Discussed/Reviewed PCP, Specialist, Doctor Visits Reviewed, Doctor Visits Discussed  PCP/Specialist Visits Compliance with follow-up visit  Exercise Interventions   Exercise Discussed/Reviewed Physical Activity  Mental Health Interventions   Mental Health Discussed/Reviewed Mental Health Reviewed, Coping Strategies  Nutrition Interventions   Nutrition Discussed/Reviewed Nutrition Reviewed, Nutrition Discussed  Safety Interventions   Safety Discussed/Reviewed Safety Reviewed, Fall Risk        Benefits reviewed  Based on current information and Insurance plan -Reviewed benefits accessible to patient, including details about eligibility options for care and  available value based care options  if  any areas of needs were identified.  Reviewed patient/  caregiver's ability to access and / or  ability with navigating the benefits system..Amb Referral made if indicted , refer to orders section of note for details   Reviewed goals for care Patient/ Caregiver  verbalizes understanding of instructions and care plan provided. Patient / Caregiver was encouraged to make informed decisions about their care, actively participate in managing their health condition, and implement lifestyle changes as needed to promote independence and self-management of health care. There were no reported  barriers to care.   TOC program  Patient is at high risk for readmission and/or has history of  high utilization  Discussed VBCI  TOC program and weekly calls to patient to assess condition/status, medication management  and provide support/education as indicated . Patient/ Caregiver voiced understanding and declined enrollment in the 30-day TOC Program.     The patient has been provided with contact information for the care management team and has been advised to call with any health-related questions or concerns. Follow up as indicated with Care Team , or sooner should any new problems arise.    Susa Loffler , BSN, RN [facility]   VBCI-Population Health RN Care Manager Direct Dial [phone]  Fax: [phone] Website: [email or web address]

## 2023-11-20 NOTE — Patient Instructions (Addendum)
 Thank you for coming to the office today.  Iron is not deficient. No iron needed  Restart Amlodipine 5mg  daily  Keep appointments for the blood draws and Hematologist.  They can review the results on the Bone Marrow Biopsy. And come up with a treatment plan.     Latest Ref Rng & Units 11/19/2023    4:56 AM 11/18/2023    8:04 AM 11/17/2023    6:40 AM  CBC  WBC 4.0 - 10.5 K/uL  5.8  4.9   Hemoglobin 12.0 - 15.0 g/dL 8.7  8.9  7.8   Hematocrit 36.0 - 46.0 % 26.6  27.4  24.0   Platelets 150 - 400 K/uL  52  49    Not likely to have any internal or gastric bleeding according to their report.  Keep on good nutrition   Please schedule a Follow-up Appointment to: Return if symptoms worsen or fail to improve.  If you have any other questions or concerns, please feel free to call the office or send a message through MyChart. You may also schedule an earlier appointment if necessary.  Additionally, you may be receiving a survey about your experience at our office within a few days to 1 week by e-mail or mail. We value your feedback.  Saralyn Pilar, DO [facility], [place]

## 2023-11-21 LAB — SURGICAL PATHOLOGY

## 2023-11-22 ENCOUNTER — Inpatient Hospital Stay: Attending: Internal Medicine

## 2023-11-22 DIAGNOSIS — D4621 Refractory anemia with excess of blasts 1: Secondary | ICD-10-CM | POA: Insufficient documentation

## 2023-11-22 DIAGNOSIS — D696 Thrombocytopenia, unspecified: Secondary | ICD-10-CM | POA: Diagnosis not present

## 2023-11-22 DIAGNOSIS — D539 Nutritional anemia, unspecified: Secondary | ICD-10-CM

## 2023-11-22 DIAGNOSIS — Z86000 Personal history of in-situ neoplasm of breast: Secondary | ICD-10-CM | POA: Diagnosis not present

## 2023-11-22 DIAGNOSIS — R590 Localized enlarged lymph nodes: Secondary | ICD-10-CM | POA: Diagnosis not present

## 2023-11-22 LAB — CBC WITH DIFFERENTIAL/PLATELET
Abs Immature Granulocytes: 0.16 10*3/uL — ABNORMAL HIGH (ref 0.00–0.07)
Basophils Absolute: 0 10*3/uL (ref 0.0–0.1)
Basophils Relative: 0 %
Eosinophils Absolute: 0 10*3/uL (ref 0.0–0.5)
Eosinophils Relative: 1 %
HCT: 23.9 % — ABNORMAL LOW (ref 36.0–46.0)
Hemoglobin: 7.7 g/dL — ABNORMAL LOW (ref 12.0–15.0)
Immature Granulocytes: 3 %
Lymphocytes Relative: 15 %
Lymphs Abs: 0.9 10*3/uL (ref 0.7–4.0)
MCH: 32.4 pg (ref 26.0–34.0)
MCHC: 32.2 g/dL (ref 30.0–36.0)
MCV: 100.4 fL — ABNORMAL HIGH (ref 80.0–100.0)
Monocytes Absolute: 0.5 10*3/uL (ref 0.1–1.0)
Monocytes Relative: 8 %
Neutro Abs: 4.4 10*3/uL (ref 1.7–7.7)
Neutrophils Relative %: 73 %
Platelets: 40 10*3/uL — ABNORMAL LOW (ref 150–400)
RBC: 2.38 MIL/uL — ABNORMAL LOW (ref 3.87–5.11)
RDW: 25.7 % — ABNORMAL HIGH (ref 11.5–15.5)
WBC: 6 10*3/uL (ref 4.0–10.5)
nRBC: 0.3 % — ABNORMAL HIGH (ref 0.0–0.2)

## 2023-11-22 LAB — SAMPLE TO BLOOD BANK

## 2023-11-22 LAB — SURGICAL PATHOLOGY

## 2023-11-23 NOTE — Addendum Note (Signed)
 Addended byMichaelyn Barter on: 11/23/2023 12:22 PM   Modules accepted: Orders

## 2023-11-26 DIAGNOSIS — M9902 Segmental and somatic dysfunction of thoracic region: Secondary | ICD-10-CM | POA: Diagnosis not present

## 2023-11-26 DIAGNOSIS — M810 Age-related osteoporosis without current pathological fracture: Secondary | ICD-10-CM | POA: Diagnosis not present

## 2023-11-26 DIAGNOSIS — Q763 Congenital scoliosis due to congenital bony malformation: Secondary | ICD-10-CM | POA: Diagnosis not present

## 2023-11-26 DIAGNOSIS — M9903 Segmental and somatic dysfunction of lumbar region: Secondary | ICD-10-CM | POA: Diagnosis not present

## 2023-11-26 DIAGNOSIS — M62838 Other muscle spasm: Secondary | ICD-10-CM | POA: Diagnosis not present

## 2023-11-26 DIAGNOSIS — M4134 Thoracogenic scoliosis, thoracic region: Secondary | ICD-10-CM | POA: Diagnosis not present

## 2023-11-26 DIAGNOSIS — M9901 Segmental and somatic dysfunction of cervical region: Secondary | ICD-10-CM | POA: Diagnosis not present

## 2023-11-27 ENCOUNTER — Other Ambulatory Visit: Payer: Self-pay | Admitting: *Deleted

## 2023-11-27 ENCOUNTER — Inpatient Hospital Stay

## 2023-11-27 ENCOUNTER — Encounter: Payer: Self-pay | Admitting: Internal Medicine

## 2023-11-27 ENCOUNTER — Inpatient Hospital Stay: Admitting: Internal Medicine

## 2023-11-27 ENCOUNTER — Encounter (HOSPITAL_COMMUNITY): Payer: Self-pay | Admitting: Internal Medicine

## 2023-11-27 VITALS — BP 149/77 | HR 75 | Temp 97.0°F | Resp 14 | Wt 126.0 lb

## 2023-11-27 DIAGNOSIS — D539 Nutritional anemia, unspecified: Secondary | ICD-10-CM

## 2023-11-27 DIAGNOSIS — Z86000 Personal history of in-situ neoplasm of breast: Secondary | ICD-10-CM | POA: Diagnosis not present

## 2023-11-27 DIAGNOSIS — R590 Localized enlarged lymph nodes: Secondary | ICD-10-CM | POA: Diagnosis not present

## 2023-11-27 DIAGNOSIS — D649 Anemia, unspecified: Secondary | ICD-10-CM

## 2023-11-27 DIAGNOSIS — D696 Thrombocytopenia, unspecified: Secondary | ICD-10-CM | POA: Diagnosis not present

## 2023-11-27 DIAGNOSIS — R591 Generalized enlarged lymph nodes: Secondary | ICD-10-CM | POA: Diagnosis not present

## 2023-11-27 DIAGNOSIS — D4621 Refractory anemia with excess of blasts 1: Secondary | ICD-10-CM | POA: Diagnosis not present

## 2023-11-27 DIAGNOSIS — D469 Myelodysplastic syndrome, unspecified: Secondary | ICD-10-CM | POA: Diagnosis not present

## 2023-11-27 LAB — COMPREHENSIVE METABOLIC PANEL
ALT: 14 U/L (ref 0–44)
AST: 17 U/L (ref 15–41)
Albumin: 3.6 g/dL (ref 3.5–5.0)
Alkaline Phosphatase: 47 U/L (ref 38–126)
Anion gap: 6 (ref 5–15)
BUN: 15 mg/dL (ref 8–23)
CO2: 25 mmol/L (ref 22–32)
Calcium: 9 mg/dL (ref 8.9–10.3)
Chloride: 105 mmol/L (ref 98–111)
Creatinine, Ser: 0.67 mg/dL (ref 0.44–1.00)
GFR, Estimated: >60 mL/min (ref >60)
Glucose, Bld: 111 mg/dL — ABNORMAL HIGH (ref 70–99)
Potassium: 3.8 mmol/L (ref 3.5–5.1)
Sodium: 136 mmol/L (ref 135–145)
Total Bilirubin: 0.9 mg/dL (ref 0.0–1.2)
Total Protein: 6.7 g/dL (ref 6.5–8.1)

## 2023-11-27 LAB — CBC WITH DIFFERENTIAL/PLATELET
Abs Immature Granulocytes: 0.41 10*3/uL — ABNORMAL HIGH (ref 0.00–0.07)
Basophils Absolute: 0 10*3/uL (ref 0.0–0.1)
Basophils Relative: 0 %
Eosinophils Absolute: 0 10*3/uL (ref 0.0–0.5)
Eosinophils Relative: 0 %
HCT: 23.2 % — ABNORMAL LOW (ref 36.0–46.0)
Hemoglobin: 7.2 g/dL — ABNORMAL LOW (ref 12.0–15.0)
Immature Granulocytes: 6 %
Lymphocytes Relative: 17 %
Lymphs Abs: 1.1 10*3/uL (ref 0.7–4.0)
MCH: 31.6 pg (ref 26.0–34.0)
MCHC: 31 g/dL (ref 30.0–36.0)
MCV: 101.8 fL — ABNORMAL HIGH (ref 80.0–100.0)
Monocytes Absolute: 0.7 10*3/uL (ref 0.1–1.0)
Monocytes Relative: 11 %
Neutro Abs: 4.5 10*3/uL (ref 1.7–7.7)
Neutrophils Relative %: 66 %
Platelets: 50 10*3/uL — ABNORMAL LOW (ref 150–400)
RBC: 2.28 MIL/uL — ABNORMAL LOW (ref 3.87–5.11)
RDW: 26 % — ABNORMAL HIGH (ref 11.5–15.5)
WBC: 6.8 10*3/uL (ref 4.0–10.5)
nRBC: 0.4 % — ABNORMAL HIGH (ref 0.0–0.2)

## 2023-11-27 LAB — PREPARE RBC (CROSSMATCH)

## 2023-11-27 NOTE — Progress Notes (Signed)
 Patient feels much better since she had the two transfusions. She has no symptoms, just wants to know what is the cause.

## 2023-11-27 NOTE — Progress Notes (Signed)
 [facility] CONSULT NOTE  Patient Care Team: Smitty Cords, DO as PCP - General (Family Medicine) Carmina Miller, MD as Referring Physician (Radiation Oncology) Lemar Livings Merrily Pew, MD (General Surgery) Jesusita Oka, MD (Dermatology) Tedd Sias Marlana Salvage, MD as Physician Assistant (Endocrinology) Michaelyn Barter, MD as Consulting Physician (Oncology)   CANCER STAGING   Cancer Staging  No matching staging information was found for the patient.   ASSESSMENT & PLAN:  Rebecca Mitchell 83 y.o. female with pmh of hypertension, hyperlipidemia, osteoporosis, left breast cancer dx 2015 status postlumpectomy, adjuvant radiation ER/PR positive, [facility] follows with hematology for MDS.  # MDS with EB type 1 # Macrocytic anemia # Thrombocytopenia -Presented to [facility] ED on 11/15/2023 with worsening shortness of breath.  On admission, hemoglobin 5.  Platelets in 50-60s.  Received 2 unit PRBC transfusion.  -BMBx on 11/19/2023 showed variably cellular marrow with with features of myeloid neoplasm.  Overall bone marrow material is limited with lack of optimal aspirate.  Differentials include MDS with excess blast with associated fibrosis, MDS/MPN overlap.  Cytogenetics showed t(3;3).  FISH MDS is pending.  Diffuse increase in reticulin fibers.  Ring sideroblasts more than 15% of erythroid precursors.  Bone marrow blasts 6%.  - Flow cytometry on BMBx showed increased number of CD34 positive blastic population representing 8% of all cells Positive for CD7, CD34, CD38, CD117, CD200 and HLA-DR consistent with myeloid phenotype.  -I discussed the case with Dr. Laureen Ochs, pathologist about bone marrow findings who is concerned for myelodysplastic syndrome with excess blast.  There is a significant amount of fibrosis component so cannot rule out overlap myeloproliferative neoplasm such as myelofibrosis.  Cytogenetics showed t (3;3).  FISH MDS is pending.  There are no kits available for a liquid  biopsy either foundation 1 and [employer] today.  Will send the testing next week once the kit is available for full molecular analysis.  JAK2/MPL/CALR sent on peripheral blood.  LDH is normal.  No appreciable splenomegaly on exam.  I discussed with the patient that I do not have full mutational panel available to make assessment of MPN component. However Per IPSS-R, based on her hemoglobin, platelet and blast count she would still fall in the high risk MDS.  Median survival of 1.6 years.  Median time to 25% AML evolution 1.4 years.  Patient was interested in knowing the treatment options.  We discussed about azacitidine subcu injections day 1-7 every 28 days cycle based on AZA001 trial.  Median survival of 24 months versus 15 months with the conventional treatment arm.  Side effects such as cytopenias, liver dysfunction, nausea, rash, bleeding, renal dysfunction was discussed.  Also discussed about oral options with Inoqvi (oral formulation of decitabine) which can be convenient.  Goal of the treatment is palliative to obtain hematological recovery, reduce need for blood transfusion and improve QOL. Referral to [facility] for second opinion.  -Monitor CBC with differential/hold tube weekly for now.  Day 2 possible blood transfusion.  If her counts drop more rapidly, will change monitoring to twice a week. -Hemoglobin today is 7.2.  Will schedule her for 1 unit irradiated PRBC transfusion on Friday.  # Left neck lymphadenopathy -Ongoing for about couple of weeks.  Tenderness is improved.  Denies any recent infection. -Obtain CT soft tissue neck with contrast for further evaluation.  # History of left breast IDC, pT1a N0, ER/PR positive, HER2 negative. -Diagnosed in 2015.  Status postlumpectomy/adjuvant radiation/letrozole -Last mammo from February 2025 which was negative.  Repeat  in 1 year.  Orders Placed This Encounter  Procedures   CT SOFT TISSUE NECK W CONTRAST    Standing Status:   Future    Expected Date:    12/04/2023    Expiration Date:   11/26/2024    If indicated for the ordered procedure, I authorize the administration of contrast media per Radiology protocol:   Yes    Does the patient have a contrast media/X-ray dye allergy?:   Yes    Preferred imaging location?:   [facility]   CBC with Differential (Cancer Center Only)    Standing Status:   Future    Expected Date:   12/04/2023    Expiration Date:   11/26/2024   CBC with Differential (Cancer Center Only)    Standing Status:   Future    Expected Date:   12/11/2023    Expiration Date:   11/26/2024   CBC with Differential (Cancer Center Only)    Standing Status:   Future    Expected Date:   12/11/2023    Expiration Date:   11/26/2024   CBC with Differential (Cancer Center Only)    Standing Status:   Future    Expected Date:   12/18/2023    Expiration Date:   11/26/2024   CMP (Cancer Center only)    Standing Status:   Future    Expected Date:   12/18/2023    Expiration Date:   11/26/2024   Informed Consent Details: Physician/Practitioner Attestation; Transcribe to consent form and obtain patient signature    Standing Status:   Future    Expiration Date:   11/26/2024    Physician/Practitioner attestation of informed consent for blood and or blood product transfusion:   I, the physician/practitioner, attest that I have discussed with the patient the benefits, risks, side effects, alternatives, likelihood of achieving goals and potential problems during recovery for the procedure that I have provided informed consent.    Product(s):   All Product(s)   Hold Tube- Blood Bank    Standing Status:   Future    Expiration Date:   11/26/2024   Hold Tube- Blood Bank    Standing Status:   Future    Expected Date:   12/04/2023    Expiration Date:   11/26/2024   Type and screen         Standing Status:   Future    Number of Occurrences:   1    Expected Date:   11/27/2023    Expiration Date:   11/26/2024   Hold Tube- Blood Bank    Standing Status:    Future    Expected Date:   12/11/2023    Expiration Date:   11/26/2024   Hold Tube- Blood Bank    Standing Status:   Future    Expected Date:   12/11/2023    Expiration Date:   11/26/2024   Hold Tube- Blood Bank    Standing Status:   Future    Expected Date:   12/18/2023    Expiration Date:   11/26/2024   RTC in 3 weeks for MD visit, labs, day 2 possible transfusion.  The total time spent in the appointment was 60 minutes encounter with patients including review of chart and various tests results, discussions about plan of care and coordination of care plan   All questions were answered. The patient knows to call the clinic with any problems, questions or concerns. No barriers to learning was detected.  Michaelyn Barter, MD  3/11/202512:09 PM   HISTORY OF PRESENTING ILLNESS:  Rebecca Mitchell 83 y.o. female with pmh of of hypertension, hyperlipidemia,  left breast cancer dx 2015 status postlumpectomy, adjuvant radiation ER/PR positive, [facility] follows with hematology for MDS.   11/15/23 -admitted to [facility] for progressive shortness of breath.  Hemoglobin of 5 and platelets of 68.  MCV 120.  On peripheral smear presence of schistocytes.  Mildly elevated indirect bilirubin.  LDH normal.  Reticulocyte 2.5%.  B12 457.  Folate normal.  Iron panel normal.  Haptoglobin normal. S/p 2 units of PRBC transfusion.  Hemoglobin has improved to 7.9.  11/17/23 - BMBx as above  Interval history Patient was seen today accompanied with her husband.  Son was over the phone to discuss bone marrow biopsy results. She is feeling better after receiving blood transfusions.  Anxious about bone marrow biopsy results.  I have reviewed her chart and materials related to her cancer extensively and collaborated history with the patient. Summary of oncologic history is as follows: Oncology History   No history exists.    MEDICAL HISTORY:  Past Medical History:  Diagnosis Date   Anemia    Breast cancer (HCC) 09/2014   left  breast lumpectomy with rad tx   Hyperlipidemia    Malignant neoplasm of lower-inner quadrant of left female breast (HCC) 07/2014   5 mm,T1a, N0; ER/ PR positive, HER-2/neu negative   Osteoporosis    Personal history of radiation therapy 2016   left breast ca    SURGICAL HISTORY: Past Surgical History:  Procedure Laterality Date   BREAST BIOPSY Left 08/2014   invasive mammary carcinoma   BREAST LUMPECTOMY Left 10/01/2014   invasive mammary carcinoma and DCIS with clear margins   BREAST SURGERY Left 10/01/2014   lumpectomy   COLONOSCOPY  2010   COLONOSCOPY WITH PROPOFOL N/A 09/21/2015   Procedure: COLONOSCOPY WITH PROPOFOL;  Surgeon: Kieth Brightly, MD;  Location: [facility] ENDOSCOPY;  Service: Endoscopy;  Laterality: N/A;   IR BONE MARROW BIOPSY & ASPIRATION  11/19/2023   UPPER GI ENDOSCOPY  2014    SOCIAL HISTORY: Social History   Socioeconomic History   Marital status: Married    Spouse name: Not on file   Number of children: Not on file   Years of education: Not on file   Highest education level: Bachelor's degree (e.g., BA, AB, BS)  Occupational History   Not on file  Tobacco Use   Smoking status: Never   Smokeless tobacco: Never  Vaping Use   Vaping status: Never Used  Substance and Sexual Activity   Alcohol use: No    Alcohol/week: 0.0 standard drinks of alcohol   Drug use: No   Sexual activity: Not on file  Other Topics Concern   Not on file  Social History Narrative   Not on file   Social Drivers of Health   Financial Resource Strain: Low Risk  (06/14/2023)   Received from [facility]   Overall Financial Resource Strain (CARDIA)    Difficulty of Paying Living Expenses: Not hard at all  Food Insecurity: No Food Insecurity (11/20/2023)   Hunger Vital Sign    Worried About Running Out of Food in the Last Year: Never true    Ran Out of Food in the Last Year: Never true  Transportation Needs: No Transportation Needs (11/20/2023)   PRAPARE -  Administrator, Civil Service (Medical): No    Lack of Transportation (Non-Medical): No  Physical Activity: Inactive (  03/07/2023)   Exercise Vital Sign    Days of Exercise per Week: 0 days    Minutes of Exercise per Session: 0 min  Stress: No Stress Concern Present (03/07/2023)   [employer] of Occupational Health - Occupational Stress Questionnaire    Feeling of Stress : Not at all  Social Connections: Patient Declined (11/15/2023)   Social Connection and Isolation Panel [NHANES]    Frequency of Communication with Friends and Family: Patient declined    Frequency of Social Gatherings with Friends and Family: Patient declined    Attends Religious Services: Patient declined    Database administrator or Organizations: Patient declined    Attends Banker Meetings: Patient declined    Marital Status: Patient declined  Intimate Partner Violence: Not At Risk (11/20/2023)   Humiliation, Afraid, Rape, and Kick questionnaire    Fear of Current or Ex-Partner: No    Emotionally Abused: No    Physically Abused: No    Sexually Abused: No    FAMILY HISTORY: Family History  Problem Relation Age of Onset   Breast cancer Sister 77   Heart disease Mother    Heart disease Father     ALLERGIES:  has no known allergies.  MEDICATIONS:  Current Outpatient Medications  Medication Sig Dispense Refill   amLODipine (NORVASC) 5 MG tablet Take 5 mg by mouth daily.     Ascorbic Acid (VITAMIN C) 1000 MG tablet Take 1,000 mg by mouth daily.     calcium carbonate 100 mg/ml SUSP Take by mouth.     Cholecalciferol (VITAMIN D3) 10 MCG (400 UNIT) CAPS Take by mouth.     co-enzyme Q-10 30 MG capsule Take 30 mg by mouth 3 (three) times daily.     FERROUS SULFATE PO Take 1 tablet by mouth once a week.     Lutein-Zeaxanthin 15-0.7 MG CAPS Take 1 capsule by mouth daily.     metoprolol tartrate (LOPRESSOR) 25 MG tablet TAKE 1 TABLET BY MOUTH TWICE  DAILY 200 tablet 1   Omega-3 Fatty  Acids (FISH OIL PO) Take 1 capsule by mouth daily.     pravastatin (PRAVACHOL) 20 MG tablet TAKE 1 TABLET BY MOUTH DAILY 100 tablet 1   Saccharomyces boulardii (PROBIOTIC) 250 MG CAPS Take 250 mg by mouth daily.     No current facility-administered medications for this visit.    REVIEW OF SYSTEMS:   Pertinent information mentioned in HPI All other systems were reviewed with the patient and are negative.  PHYSICAL EXAMINATION: ECOG PERFORMANCE STATUS: 1 - Symptomatic but completely ambulatory  Vitals:   11/27/23 1031  BP: (!) 149/77  Pulse: 75  Resp: 14  Temp: (!) 97 F (36.1 C)  SpO2: 97%   Filed Weights   11/27/23 1031  Weight: 126 lb (57.2 kg)    GENERAL:alert, no distress and comfortable SKIN: skin color, texture, turgor are normal, no rashes or significant lesions EYES: normal, conjunctiva are pink and non-injected, sclera clear OROPHARYNX:no exudate, no erythema and lips, buccal mucosa, and tongue normal  NECK: supple, thyroid normal size, non-tender, without nodularity LYMPH:  no palpable lymphadenopathy in the cervical, axillary or inguinal LUNGS: clear to auscultation and percussion with normal breathing effort HEART: regular rate & rhythm and no murmurs and no lower extremity edema ABDOMEN:abdomen soft, non-tender and normal bowel sounds Musculoskeletal:no cyanosis of digits and no clubbing  PSYCH: alert & oriented x 3 with fluent speech NEURO: no focal motor/sensory deficits  LABORATORY DATA:  I have  reviewed the data as listed Lab Results  Component Value Date   WBC 6.8 11/27/2023   HGB 7.2 (L) 11/27/2023   HCT 23.2 (L) 11/27/2023   MCV 101.8 (H) 11/27/2023   PLT 50 (L) 11/27/2023   Recent Labs    11/16/23 0507 11/16/23 1316 11/17/23 0640 11/18/23 0804 11/27/23 1010  NA 138  --  138 137 136  K 3.0*  --  4.2 3.9 3.8  CL 103  --  108 106 105  CO2 25  --  25 24 25   GLUCOSE 105*  --  101* 107* 111*  BUN 20  --  16 15 15   CREATININE 0.73  --  0.65  0.65 0.67  CALCIUM 8.9  --  8.7* 9.3 9.0  GFRNONAA >60  --  >60 >60 >60  PROT 6.0*  --   --  6.6 6.7  ALBUMIN 3.4*  --   --  3.6 3.6  AST 15  --   --  15 17  ALT 10  --   --  11 14  ALKPHOS 34*  --   --  34* 47  BILITOT 1.7* 1.5*  --  1.3* 0.9  BILIDIR  --  0.2  --  0.2  --   IBILI  --  1.3*  --  1.1*  --     RADIOGRAPHIC STUDIES: I have personally reviewed the radiological images as listed and agreed with the findings in the report. IR BONE MARROW BIOPSY & ASPIRATION Result Date: 11/19/2023 INDICATION: Anemia, she is to sites on peripheral smear. Patient presents for bone marrow biopsy. EXAM: FLUOROSCOPIC GUIDED BONE MARROW ASPIRATION AND CORE BIOPSY Interventional Radiologist:  Sterling Big, MD MEDICATIONS: None. ANESTHESIA/SEDATION: Moderate (conscious) sedation was employed during this procedure. A total of 1.5 milligrams versed and 75 micrograms fentanyl were administered intravenously by the Radiology nurse. The patient's level of consciousness and vital signs were monitored continuously by radiology nursing throughout the procedure under my direct supervision. Total monitored sedation time: 17 minutes FLUOROSCOPY: Radiation exposure index: 39 mGy reference air kerma COMPLICATIONS: None immediate. Estimated blood loss: <25 mL PROCEDURE: Informed written consent was obtained from the patient after a thorough discussion of the procedural risks, benefits and alternatives. All questions were addressed. Maximal Sterile Barrier Technique was utilized including caps, mask, sterile gowns, sterile gloves, sterile drape, hand hygiene and skin antiseptic. A timeout was performed prior to the initiation of the procedure. The patient was positioned prone and localization fluoroscopy was performed of the pelvis to demonstrate the iliac marrow spaces. Maximal barrier sterile technique utilized including caps, mask, sterile gowns, sterile gloves, large sterile drape, hand hygiene, and betadine prep.  Under sterile conditions and local anesthesia, an 11 gauge coaxial bone biopsy needle was advanced into the left iliac marrow space. Needle position was confirmed with imaging. Initially, bone marrow aspiration was performed. Next, the 11 gauge outer cannula was utilized to obtain a left iliac bone marrow core biopsy. Needle was removed. Hemostasis was obtained with compression. The patient tolerated the procedure well. Samples were prepared with the cytotechnologist. IMPRESSION: Successful fluoroscopic guided left iliac bone marrow aspirate and core biopsy. Electronically Signed   By: Malachy Moan M.D.   On: 11/19/2023 13:18   ECHOCARDIOGRAM COMPLETE Result Date: 11/16/2023    ECHOCARDIOGRAM REPORT   Patient Name:   [facility] A Artiga Date of Exam: 11/16/2023 Medical Rec #:  [id number]    Height:       63.0 in Accession #:  [id number]   Weight:       126.0 lb Date of Birth:  [birth year]    BSA:          1.589 m Patient Age:    82 years     BP:           120/63 mmHg Patient Gender: F            HR:           87 bpm. Exam Location:  [facility] Procedure: 2D Echo, Cardiac Doppler and Color Doppler (Both Spectral and Color            Flow Doppler were utilized during procedure). Indications:     Dyspnea R06.00  History:         Patient has no prior history of Echocardiogram examinations.                  Risk Factors:Dyslipidemia.  Sonographer:     Cristela Blue Referring Phys:  [id number] Francoise Schaumann [place] Diagnosing Phys: Marcina Millard MD IMPRESSIONS  1. Left ventricular ejection fraction, by estimation, is 65 to 70%. The left ventricle has normal function. The left ventricle has no regional wall motion abnormalities. Left ventricular diastolic parameters are consistent with Grade I diastolic dysfunction (impaired relaxation).  2. Right ventricular systolic function is normal. The right ventricular size is normal.  3. The mitral valve is normal in structure. Mild to moderate mitral valve regurgitation. No evidence of mitral  stenosis.  4. The aortic valve is normal in structure. Aortic valve regurgitation is not visualized. No aortic stenosis is present.  5. The inferior vena cava is normal in size with greater than 50% respiratory variability, suggesting right atrial pressure of 3 mmHg. FINDINGS  Left Ventricle: Left ventricular ejection fraction, by estimation, is 65 to 70%. The left ventricle has normal function. The left ventricle has no regional wall motion abnormalities. Strain imaging was not performed. The left ventricular internal cavity  size was normal in size. There is no left ventricular hypertrophy. Left ventricular diastolic parameters are consistent with Grade I diastolic dysfunction (impaired relaxation). Right Ventricle: The right ventricular size is normal. No increase in right ventricular wall thickness. Right ventricular systolic function is normal. Left Atrium: Left atrial size was normal in size. Right Atrium: Right atrial size was normal in size. Pericardium: There is no evidence of pericardial effusion. Mitral Valve: The mitral valve is normal in structure. Mild to moderate mitral valve regurgitation. No evidence of mitral valve stenosis. MV peak gradient, 2.4 mmHg. The mean mitral valve gradient is 1.0 mmHg. Tricuspid Valve: The tricuspid valve is normal in structure. Tricuspid valve regurgitation is mild . No evidence of tricuspid stenosis. Aortic Valve: The aortic valve is normal in structure. Aortic valve regurgitation is not visualized. No aortic stenosis is present. Aortic valve mean gradient measures 6.0 mmHg. Aortic valve peak gradient measures 11.0 mmHg. Aortic valve area, by VTI measures 2.00 cm. Pulmonic Valve: The pulmonic valve was normal in structure. Pulmonic valve regurgitation is not visualized. No evidence of pulmonic stenosis. Aorta: The aortic root is normal in size and structure. Venous: The inferior vena cava is normal in size with greater than 50% respiratory variability, suggesting right  atrial pressure of 3 mmHg. IAS/Shunts: No atrial level shunt detected by color flow Doppler. Additional Comments: 3D imaging was not performed.  LEFT VENTRICLE PLAX 2D LVIDd:         4.00 cm   Diastology LVIDs:  2.20 cm   LV e' medial:    9.90 cm/s LV PW:         1.00 cm   LV E/e' medial:  7.0 LV IVS:        1.20 cm   LV e' lateral:   11.20 cm/s LVOT diam:     2.00 cm   LV E/e' lateral: 6.2 LV SV:         69 LV SV Index:   43 LVOT Area:     3.14 cm  RIGHT VENTRICLE RV Basal diam:  3.40 cm RV Mid diam:    2.50 cm LEFT ATRIUM           Index        RIGHT ATRIUM           Index LA diam:      3.90 cm 2.45 cm/m   RA Area:     15.40 cm LA Vol (A2C): 41.6 ml 26.18 ml/m  RA Volume:   32.30 ml  20.33 ml/m LA Vol (A4C): 41.0 ml 25.80 ml/m  AORTIC VALVE                     PULMONIC VALVE AV Area (Vmax):    2.03 cm      PR End Diast Vel: 9.24 msec AV Area (Vmean):   1.82 cm AV Area (VTI):     2.00 cm AV Vmax:           165.50 cm/s AV Vmean:          112.000 cm/s AV VTI:            0.344 m AV Peak Grad:      11.0 mmHg AV Mean Grad:      6.0 mmHg LVOT Vmax:         107.00 cm/s LVOT Vmean:        64.800 cm/s LVOT VTI:          0.219 m LVOT/AV VTI ratio: 0.64  AORTA Ao Root diam: 2.80 cm MITRAL VALVE               TRICUSPID VALVE MV Area (PHT): 2.70 cm    TR Peak grad:   37.5 mmHg MV Area VTI:   3.46 cm    TR Vmax:        306.00 cm/s MV Peak grad:  2.4 mmHg MV Mean grad:  1.0 mmHg    SHUNTS MV Vmax:       0.78 m/s    Systemic VTI:  0.22 m MV Vmean:      54.2 cm/s   Systemic Diam: 2.00 cm MV Decel Time: 281 msec MV E velocity: 69.70 cm/s MV A velocity: 89.10 cm/s MV E/A ratio:  0.78 Marcina Millard MD Electronically signed by Marcina Millard MD Signature Date/Time: 11/16/2023/1:15:18 PM    Final    CT Angio Chest Pulmonary Embolism (PE) W or WO Contrast Result Date: 11/15/2023 CLINICAL DATA:  Shortness of breath on exertion EXAM: CT ANGIOGRAPHY CHEST WITH CONTRAST TECHNIQUE: Multidetector CT imaging of  the chest was performed using the standard protocol during bolus administration of intravenous contrast. Multiplanar CT image reconstructions and MIPs were obtained to evaluate the vascular anatomy. RADIATION DOSE REDUCTION: This exam was performed according to the departmental dose-optimization program which includes automated exposure control, adjustment of the mA and/or kV according to patient size and/or use of iterative reconstruction technique. CONTRAST:  75mL OMNIPAQUE IOHEXOL 350 MG/ML SOLN COMPARISON:  06/22/2021 FINDINGS: Cardiovascular: Satisfactory opacification of the pulmonary arteries to the segmental level. No evidence of pulmonary embolism. Central pulmonary arteries are mildly dilated. Thoracic aorta is nonaneurysmal. Scattered atherosclerotic vascular calcifications of the aorta and coronary arteries. Mild cardiomegaly. No pericardial effusion. Mediastinum/Nodes: No axillary, mediastinal, or hilar lymphadenopathy. Partial thyroidectomy. Trachea within normal limits. Large hiatal hernia. Lungs/Pleura: Very slight mosaic attenuation throughout both lung fields. Lungs are otherwise clear. No pleural effusion or pneumothorax. Upper Abdomen: Cholelithiasis.  No acute findings. Musculoskeletal: Severe dextroscoliotic curvature of the lower thoracic spine. No acute osseous abnormality. No significant chest wall abnormality is evident. Review of the MIP images confirms the above findings. IMPRESSION: 1. No evidence of pulmonary embolism. 2. Very slight mosaic attenuation throughout both lung fields, which can be seen in the setting of small airways disease. 3. Mild cardiomegaly with mild enlargement of the central pulmonary vasculature, which can be seen in the setting of pulmonary arterial hypertension. 4. Large hiatal hernia. 5. Cholelithiasis. 6. Aortic and coronary artery atherosclerosis. Electronically Signed   By: Duanne Guess D.O.   On: 11/15/2023 14:57   MM 3D SCREENING MAMMOGRAM BILATERAL  BREAST Result Date: 11/12/2023 CLINICAL DATA:  Screening. EXAM: DIGITAL SCREENING BILATERAL MAMMOGRAM WITH TOMOSYNTHESIS AND CAD TECHNIQUE: Bilateral screening digital craniocaudal and mediolateral oblique mammograms were obtained. Bilateral screening digital breast tomosynthesis was performed. The images were evaluated with computer-aided detection. COMPARISON:  Previous exam(s). ACR Breast Density Category c: The breasts are heterogeneously dense, which may obscure small masses. FINDINGS: There are no findings suspicious for malignancy. IMPRESSION: No mammographic evidence of malignancy. A result letter of this screening mammogram will be mailed directly to the patient. RECOMMENDATION: Screening mammogram in one year. (Code:SM-B-01Y) BI-RADS CATEGORY  1: Negative. Electronically Signed   By: Harmon Pier M.D.   On: 11/12/2023 16:34

## 2023-11-27 NOTE — Patient Instructions (Addendum)
 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 Cannot rule out myeloproliferative component with fibrosis. We are still waiting on mutational testing from bone marrow. We will also send blood test (Tempus testing) when you come back next week.   Referral to [facility] for second opinion.   We will monitor your blood work closely once a week (every Tuesdays) with possible blood trnasfusion on Wednesdays. If counts are drifting we will monitor your labs twice a week.   We will arrange you for blood transfusion this week.   We briefly spoke about Azacitidine (injection) and Inoqvi pills. We will discuss further once all the information is available.

## 2023-11-28 ENCOUNTER — Ambulatory Visit
Admission: RE | Admit: 2023-11-28 | Discharge: 2023-11-28 | Disposition: A | Discharge status: Home / Self Care | Source: Ambulatory Visit | Attending: Internal Medicine | Admitting: Internal Medicine

## 2023-11-28 ENCOUNTER — Encounter: Payer: Self-pay | Admitting: *Deleted

## 2023-11-28 ENCOUNTER — Other Ambulatory Visit: Payer: Self-pay | Admitting: *Deleted

## 2023-11-28 DIAGNOSIS — R591 Generalized enlarged lymph nodes: Secondary | ICD-10-CM | POA: Insufficient documentation

## 2023-11-28 DIAGNOSIS — D469 Myelodysplastic syndrome, unspecified: Secondary | ICD-10-CM

## 2023-11-28 DIAGNOSIS — I6523 Occlusion and stenosis of bilateral carotid arteries: Secondary | ICD-10-CM | POA: Diagnosis not present

## 2023-11-28 DIAGNOSIS — R590 Localized enlarged lymph nodes: Secondary | ICD-10-CM | POA: Diagnosis not present

## 2023-11-28 DIAGNOSIS — D649 Anemia, unspecified: Secondary | ICD-10-CM | POA: Diagnosis not present

## 2023-11-28 MED ORDER — IOHEXOL 300 MG/ML  SOLN
75.0000 mL | Freq: Once | INTRAMUSCULAR | Status: AC | PRN
  Administered 2023-11-28: 60 mL via INTRAVENOUS

## 2023-11-28 MED ORDER — SODIUM CHLORIDE 0.9 % IV BOLUS
50.0000 mL | Freq: Once | INTRAVENOUS | Status: AC
  Administered 2023-11-28: 50 mL via INTRAVENOUS

## 2023-11-29 DIAGNOSIS — R9389 Abnormal findings on diagnostic imaging of other specified body structures: Secondary | ICD-10-CM | POA: Diagnosis not present

## 2023-11-29 DIAGNOSIS — D469 Myelodysplastic syndrome, unspecified: Secondary | ICD-10-CM | POA: Diagnosis not present

## 2023-11-29 DIAGNOSIS — J9811 Atelectasis: Secondary | ICD-10-CM | POA: Diagnosis not present

## 2023-11-30 ENCOUNTER — Encounter (HOSPITAL_COMMUNITY): Payer: Self-pay | Admitting: Internal Medicine

## 2023-11-30 ENCOUNTER — Inpatient Hospital Stay

## 2023-11-30 DIAGNOSIS — R590 Localized enlarged lymph nodes: Secondary | ICD-10-CM | POA: Diagnosis not present

## 2023-11-30 DIAGNOSIS — D696 Thrombocytopenia, unspecified: Secondary | ICD-10-CM | POA: Diagnosis not present

## 2023-11-30 DIAGNOSIS — D649 Anemia, unspecified: Secondary | ICD-10-CM

## 2023-11-30 DIAGNOSIS — Z86000 Personal history of in-situ neoplasm of breast: Secondary | ICD-10-CM | POA: Diagnosis not present

## 2023-11-30 DIAGNOSIS — D4621 Refractory anemia with excess of blasts 1: Secondary | ICD-10-CM | POA: Diagnosis not present

## 2023-11-30 MED ORDER — SODIUM CHLORIDE 0.9% IV SOLUTION
250.0000 mL | INTRAVENOUS | Status: DC
  Administered 2023-11-30: 100 mL via INTRAVENOUS
  Filled 2023-11-30: qty 250

## 2023-11-30 MED ORDER — DIPHENHYDRAMINE HCL 25 MG PO CAPS
25.0000 mg | ORAL_CAPSULE | Freq: Once | ORAL | Status: AC
  Administered 2023-11-30: 25 mg via ORAL
  Filled 2023-11-30: qty 1

## 2023-11-30 MED ORDER — ACETAMINOPHEN 325 MG PO TABS
650.0000 mg | ORAL_TABLET | Freq: Once | ORAL | Status: AC
  Administered 2023-11-30: 650 mg via ORAL
  Filled 2023-11-30: qty 2

## 2023-11-30 NOTE — Patient Instructions (Signed)
 Blood Transfusion, Adult A blood transfusion is a procedure in which you receive blood through an IV tube. You may need this procedure because of: A bleeding disorder. An illness. An injury. A surgery. The blood may come from someone else (a donor). You may also be able to donate blood for yourself before a surgery. The blood given in a transfusion may be made up of different types of cells. You may get: Red blood cells. These carry oxygen to the cells in the body. Platelets. These help your blood to clot. Plasma. This is the liquid part of your blood. It carries proteins and other substances through the body. White blood cells. These help you fight infections. If you have a clotting disorder, you may also get other types of blood products. Depending on the type of blood product, this procedure may take 1-4 hours to complete. Tell your doctor about: Any bleeding problems you have. Any reactions you have had during a blood transfusion in the past. Any allergies you have. All medicines you are taking, including vitamins, herbs, eye drops, creams, and over-the-counter medicines. Any surgeries you have had. Any medical conditions you have. Whether you are pregnant or may be pregnant. What are the risks? Talk with your health care provider about risks. The most common problems include: A mild allergic reaction. This includes red, swollen areas of skin (hives) and itching. Fever or chills. This may be the body's response to new blood cells received. This may happen during or up to 4 hours after the transfusion. More serious problems may include: A serious allergic reaction. This includes breathing trouble or swelling around the face and lips. Too much fluid in the lungs. This may cause breathing problems. Lung injury. This causes breathing trouble and low oxygen in the blood. This can happen within hours of the transfusion or days later. Too much iron. This can happen after getting many blood  transfusions over a period of time. An infection or virus passed through the blood. This is rare. Donated blood is carefully tested before it is given. Your body's defense system (immune system) trying to attack the new blood cells. This is rare. Symptoms may include fever, chills, nausea, low blood pressure, and low back or chest pain. Donated cells attacking healthy tissues. This is rare. What happens before the procedure? You will have a blood test to find out your blood type. The test also finds out what type of blood your body will accept and matches it to the donor type. If you are going to have a planned surgery, you may be able to donate your own blood. This may be done in case you need a transfusion. You will have your temperature, blood pressure, and pulse checked. You may receive medicine to help prevent an allergic reaction. This may be done if you have had a reaction to a transfusion before. This medicine may be given to you by mouth or through an IV tube. What happens during the procedure?  An IV tube will be put into one of your veins. The bag of blood will be attached to your IV tube. Then, the blood will enter through your vein. Your temperature, blood pressure, and pulse will be checked often. This is done to find early signs of a transfusion reaction. Tell your nurse right away if you have any of these symptoms: Shortness of breath or trouble breathing. Chest or back pain. Fever or chills. Red, swollen areas of skin or itching. If you have any signs  or symptoms of a reaction, your transfusion will be stopped. You may also be given medicine. When the transfusion is finished, your IV tube will be taken out. Pressure may be put on the IV site for a few minutes. A bandage (dressing) will be put on the IV site. The procedure may vary among doctors and hospitals. What happens after the procedure? You will be monitored until you leave the hospital or clinic. This includes  checking your temperature, blood pressure, pulse, breathing rate, and blood oxygen level. Your blood may be tested to see how you have responded to the transfusion. You may be warmed with fluids or blankets. This is done to keep the temperature of your body normal. If you have your procedure in an outpatient setting, you will be told whom to contact to report any reactions. Where to find more information Visit the [employer]: redcross.org Summary A blood transfusion is a procedure in which you receive blood through an IV tube. The blood you are given may be made up of different blood cells. You may receive red blood cells, platelets, plasma, or white blood cells. Your temperature, blood pressure, and pulse will be checked often. After the procedure, your blood may be tested to see how you have responded. This information is not intended to replace advice given to you by your health care provider. Make sure you discuss any questions you have with your health care provider. Document Revised: 12/02/2021 Document Reviewed: 12/02/2021 Elsevier Patient Education  2024 [employer].

## 2023-12-01 LAB — BPAM RBC
Blood Product Expiration Date: 202504052359
ISSUE DATE / TIME: 202503140904
Unit Type and Rh: 5100

## 2023-12-01 LAB — TYPE AND SCREEN
ABO/RH(D): O POS
Antibody Screen: NEGATIVE
Unit division: 0

## 2023-12-03 ENCOUNTER — Encounter: Payer: Self-pay | Admitting: *Deleted

## 2023-12-03 DIAGNOSIS — E785 Hyperlipidemia, unspecified: Secondary | ICD-10-CM | POA: Diagnosis not present

## 2023-12-03 DIAGNOSIS — M9901 Segmental and somatic dysfunction of cervical region: Secondary | ICD-10-CM | POA: Diagnosis not present

## 2023-12-03 DIAGNOSIS — M9902 Segmental and somatic dysfunction of thoracic region: Secondary | ICD-10-CM | POA: Diagnosis not present

## 2023-12-03 DIAGNOSIS — M4134 Thoracogenic scoliosis, thoracic region: Secondary | ICD-10-CM | POA: Diagnosis not present

## 2023-12-03 DIAGNOSIS — M9903 Segmental and somatic dysfunction of lumbar region: Secondary | ICD-10-CM | POA: Diagnosis not present

## 2023-12-03 DIAGNOSIS — I10 Essential (primary) hypertension: Secondary | ICD-10-CM | POA: Diagnosis not present

## 2023-12-03 DIAGNOSIS — Q763 Congenital scoliosis due to congenital bony malformation: Secondary | ICD-10-CM | POA: Diagnosis not present

## 2023-12-03 DIAGNOSIS — I2510 Atherosclerotic heart disease of native coronary artery without angina pectoris: Secondary | ICD-10-CM | POA: Diagnosis not present

## 2023-12-04 ENCOUNTER — Inpatient Hospital Stay

## 2023-12-04 ENCOUNTER — Telehealth: Payer: Self-pay | Admitting: *Deleted

## 2023-12-04 ENCOUNTER — Encounter: Payer: Self-pay | Admitting: *Deleted

## 2023-12-04 DIAGNOSIS — Z86000 Personal history of in-situ neoplasm of breast: Secondary | ICD-10-CM | POA: Diagnosis not present

## 2023-12-04 DIAGNOSIS — R591 Generalized enlarged lymph nodes: Secondary | ICD-10-CM

## 2023-12-04 DIAGNOSIS — D649 Anemia, unspecified: Secondary | ICD-10-CM

## 2023-12-04 DIAGNOSIS — D696 Thrombocytopenia, unspecified: Secondary | ICD-10-CM | POA: Diagnosis not present

## 2023-12-04 DIAGNOSIS — R590 Localized enlarged lymph nodes: Secondary | ICD-10-CM | POA: Diagnosis not present

## 2023-12-04 DIAGNOSIS — D4621 Refractory anemia with excess of blasts 1: Secondary | ICD-10-CM | POA: Diagnosis not present

## 2023-12-04 DIAGNOSIS — D469 Myelodysplastic syndrome, unspecified: Secondary | ICD-10-CM

## 2023-12-04 LAB — CBC WITH DIFFERENTIAL (CANCER CENTER ONLY)
Abs Immature Granulocytes: 0.1 10*3/uL — ABNORMAL HIGH (ref 0.00–0.07)
Basophils Absolute: 0.1 10*3/uL (ref 0.0–0.1)
Basophils Relative: 1 %
Eosinophils Absolute: 0.1 10*3/uL (ref 0.0–0.5)
Eosinophils Relative: 1 %
HCT: 24.6 % — ABNORMAL LOW (ref 36.0–46.0)
Hemoglobin: 7.8 g/dL — ABNORMAL LOW (ref 12.0–15.0)
Lymphocytes Relative: 20 %
Lymphs Abs: 1 10*3/uL (ref 0.7–4.0)
MCH: 30.2 pg (ref 26.0–34.0)
MCHC: 31.7 g/dL (ref 30.0–36.0)
MCV: 95.3 fL (ref 80.0–100.0)
Metamyelocytes Relative: 1 %
Monocytes Absolute: 0.4 10*3/uL (ref 0.1–1.0)
Monocytes Relative: 7 %
Myelocytes: 1 %
Neutro Abs: 3.6 10*3/uL (ref 1.7–7.7)
Neutrophils Relative %: 69 %
Platelet Count: 43 10*3/uL — ABNORMAL LOW (ref 150–400)
RBC: 2.58 MIL/uL — ABNORMAL LOW (ref 3.87–5.11)
RDW: 25.5 % — ABNORMAL HIGH (ref 11.5–15.5)
Smear Review: ADEQUATE
WBC Count: 5.2 10*3/uL (ref 4.0–10.5)
nRBC: 1.2 % — ABNORMAL HIGH (ref 0.0–0.2)

## 2023-12-04 LAB — SAMPLE TO BLOOD BANK

## 2023-12-04 NOTE — Telephone Encounter (Signed)
-----   Message from Michaelyn Barter sent at 11/28/2023 11:27 AM EDT ----- Regarding: foundation one heme Please send for Foundation one Heme next week when she comes for weekly blood draw.   Thank you.

## 2023-12-04 NOTE — Telephone Encounter (Signed)
-----   Message from Michaelyn Barter sent at 12/04/2023 10:21 AM EDT ----- Labs look good. No transfusion tomorrow.   Thank you ----- Message ----- From: Leory Plowman, Lab In [place] Sent: 12/04/2023  10:10 AM EDT To: Michaelyn Barter, MD

## 2023-12-04 NOTE — Telephone Encounter (Signed)
 Foundation heme drawn today and submitted.

## 2023-12-04 NOTE — Telephone Encounter (Signed)
 Rn spoke with patient. Pt aware that blood transfusion is not needed tomorrow.  She reports that her [facility] referral is [facility] for Monday and [facility] wil be drawing labs on Monday. Discussed w/patient that if hgb is drawn and low and [facility] treats, pt may or may not need repeat labs again on Tuesday. TBD- we will touch base with patient s/p apt at [facility] to see how the visit went. Pt understands that if she becomes more symptomatic - shortness of breath or weakness, she will call our office back. As for now, we can cnl blood transfusion apt tom. Apt cnl. Pt thanked me for updating her on her care.

## 2023-12-05 ENCOUNTER — Inpatient Hospital Stay

## 2023-12-05 LAB — JAK2 V617F RFX CALR/MPL/E12-15

## 2023-12-05 LAB — CALR +MPL + E12-E15  (REFLEX)

## 2023-12-06 DIAGNOSIS — E041 Nontoxic single thyroid nodule: Secondary | ICD-10-CM | POA: Diagnosis not present

## 2023-12-06 DIAGNOSIS — Z9889 Other specified postprocedural states: Secondary | ICD-10-CM | POA: Diagnosis not present

## 2023-12-06 DIAGNOSIS — M810 Age-related osteoporosis without current pathological fracture: Secondary | ICD-10-CM | POA: Diagnosis not present

## 2023-12-10 DIAGNOSIS — C9200 Acute myeloblastic leukemia, not having achieved remission: Secondary | ICD-10-CM | POA: Diagnosis not present

## 2023-12-10 DIAGNOSIS — D469 Myelodysplastic syndrome, unspecified: Secondary | ICD-10-CM | POA: Diagnosis not present

## 2023-12-11 ENCOUNTER — Telehealth: Payer: Self-pay | Admitting: *Deleted

## 2023-12-11 ENCOUNTER — Inpatient Hospital Stay

## 2023-12-11 NOTE — Telephone Encounter (Signed)
 Ok to cnl today's labs and tomorrow's blood transfusion per DR. A. Pt called back to confirm apts cnl and instructed to keep apt as sch next Thursday with Dr. Mervyn Skeeters. She thanked me for calling her back.

## 2023-12-11 NOTE — Telephone Encounter (Signed)
 pt called hgb 8.0 at [facility]. they did not transfuse she has another bone marrow planned on Thursday and blood work then too. Pt inquired if it's reasonable to hold off blood transfusion and redraw of labs today. Msg sent to Dr. Alena Bills to confirm.

## 2023-12-12 ENCOUNTER — Inpatient Hospital Stay

## 2023-12-13 DIAGNOSIS — R791 Abnormal coagulation profile: Secondary | ICD-10-CM | POA: Diagnosis not present

## 2023-12-13 DIAGNOSIS — C9200 Acute myeloblastic leukemia, not having achieved remission: Secondary | ICD-10-CM | POA: Diagnosis not present

## 2023-12-14 ENCOUNTER — Encounter: Payer: Self-pay | Admitting: Internal Medicine

## 2023-12-14 DIAGNOSIS — D469 Myelodysplastic syndrome, unspecified: Secondary | ICD-10-CM | POA: Diagnosis not present

## 2023-12-17 ENCOUNTER — Telehealth: Payer: Self-pay | Admitting: *Deleted

## 2023-12-17 DIAGNOSIS — H52223 Regular astigmatism, bilateral: Secondary | ICD-10-CM | POA: Diagnosis not present

## 2023-12-17 DIAGNOSIS — H2513 Age-related nuclear cataract, bilateral: Secondary | ICD-10-CM | POA: Diagnosis not present

## 2023-12-17 DIAGNOSIS — M9903 Segmental and somatic dysfunction of lumbar region: Secondary | ICD-10-CM | POA: Diagnosis not present

## 2023-12-17 DIAGNOSIS — M9901 Segmental and somatic dysfunction of cervical region: Secondary | ICD-10-CM | POA: Diagnosis not present

## 2023-12-17 DIAGNOSIS — H43813 Vitreous degeneration, bilateral: Secondary | ICD-10-CM | POA: Diagnosis not present

## 2023-12-17 DIAGNOSIS — H33311 Horseshoe tear of retina without detachment, right eye: Secondary | ICD-10-CM | POA: Diagnosis not present

## 2023-12-17 DIAGNOSIS — H353110 Nonexudative age-related macular degeneration, right eye, stage unspecified: Secondary | ICD-10-CM | POA: Diagnosis not present

## 2023-12-17 DIAGNOSIS — H5203 Hypermetropia, bilateral: Secondary | ICD-10-CM | POA: Diagnosis not present

## 2023-12-17 DIAGNOSIS — M4134 Thoracogenic scoliosis, thoracic region: Secondary | ICD-10-CM | POA: Diagnosis not present

## 2023-12-17 DIAGNOSIS — H524 Presbyopia: Secondary | ICD-10-CM | POA: Diagnosis not present

## 2023-12-17 DIAGNOSIS — M9902 Segmental and somatic dysfunction of thoracic region: Secondary | ICD-10-CM | POA: Diagnosis not present

## 2023-12-17 DIAGNOSIS — Q763 Congenital scoliosis due to congenital bony malformation: Secondary | ICD-10-CM | POA: Diagnosis not present

## 2023-12-17 DIAGNOSIS — H353121 Nonexudative age-related macular degeneration, left eye, early dry stage: Secondary | ICD-10-CM | POA: Diagnosis not present

## 2023-12-17 NOTE — Telephone Encounter (Signed)
 Foundation testing results faxed to [facility] to attn. [employer]

## 2023-12-18 ENCOUNTER — Telehealth: Payer: Self-pay | Admitting: *Deleted

## 2023-12-18 NOTE — Telephone Encounter (Signed)
 Patient called and would like to cnl apts for Thursday and Friday. Apts with lab/ Dr. Alena Bills and blood transfusion on Friday. She has apts at [facility] to recheck labs and meet with the research study team. They will draw labs at [facility] as well. She will call back once she knows the plan at [facility].  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 Pt had a repeat bone marrow performed last Thursday. Last week - hgb dropped to 6.6 on repeat labs at time of bone marrow. Pt received transfusion blood at [facility] at that time.  Msg sent to scheduling to cnl apts per pt request.

## 2023-12-20 ENCOUNTER — Inpatient Hospital Stay: Admitting: Internal Medicine

## 2023-12-20 ENCOUNTER — Inpatient Hospital Stay

## 2023-12-20 DIAGNOSIS — C9200 Acute myeloblastic leukemia, not having achieved remission: Secondary | ICD-10-CM | POA: Diagnosis not present

## 2023-12-20 LAB — MISCELLANEOUS TEST

## 2023-12-21 ENCOUNTER — Inpatient Hospital Stay

## 2023-12-24 DIAGNOSIS — Z006 Encounter for examination for normal comparison and control in clinical research program: Secondary | ICD-10-CM | POA: Diagnosis not present

## 2023-12-24 DIAGNOSIS — C9200 Acute myeloblastic leukemia, not having achieved remission: Secondary | ICD-10-CM | POA: Diagnosis not present

## 2023-12-25 DIAGNOSIS — C9200 Acute myeloblastic leukemia, not having achieved remission: Secondary | ICD-10-CM | POA: Diagnosis not present

## 2023-12-25 DIAGNOSIS — Z5111 Encounter for antineoplastic chemotherapy: Secondary | ICD-10-CM | POA: Diagnosis not present

## 2023-12-26 DIAGNOSIS — Z5111 Encounter for antineoplastic chemotherapy: Secondary | ICD-10-CM | POA: Diagnosis not present

## 2023-12-26 DIAGNOSIS — C9200 Acute myeloblastic leukemia, not having achieved remission: Secondary | ICD-10-CM | POA: Diagnosis not present

## 2023-12-27 DIAGNOSIS — I10 Essential (primary) hypertension: Secondary | ICD-10-CM | POA: Diagnosis not present

## 2023-12-27 DIAGNOSIS — M810 Age-related osteoporosis without current pathological fracture: Secondary | ICD-10-CM | POA: Diagnosis not present

## 2023-12-27 DIAGNOSIS — Z853 Personal history of malignant neoplasm of breast: Secondary | ICD-10-CM | POA: Diagnosis not present

## 2023-12-27 DIAGNOSIS — Z5111 Encounter for antineoplastic chemotherapy: Secondary | ICD-10-CM | POA: Diagnosis not present

## 2023-12-27 DIAGNOSIS — Z006 Encounter for examination for normal comparison and control in clinical research program: Secondary | ICD-10-CM | POA: Diagnosis not present

## 2023-12-27 DIAGNOSIS — E785 Hyperlipidemia, unspecified: Secondary | ICD-10-CM | POA: Diagnosis not present

## 2023-12-27 DIAGNOSIS — C9200 Acute myeloblastic leukemia, not having achieved remission: Secondary | ICD-10-CM | POA: Diagnosis not present

## 2023-12-31 DIAGNOSIS — K649 Unspecified hemorrhoids: Secondary | ICD-10-CM | POA: Diagnosis not present

## 2023-12-31 DIAGNOSIS — C9200 Acute myeloblastic leukemia, not having achieved remission: Secondary | ICD-10-CM | POA: Diagnosis not present

## 2023-12-31 DIAGNOSIS — M810 Age-related osteoporosis without current pathological fracture: Secondary | ICD-10-CM | POA: Diagnosis not present

## 2023-12-31 DIAGNOSIS — D7589 Other specified diseases of blood and blood-forming organs: Secondary | ICD-10-CM | POA: Diagnosis not present

## 2023-12-31 DIAGNOSIS — E785 Hyperlipidemia, unspecified: Secondary | ICD-10-CM | POA: Diagnosis not present

## 2023-12-31 DIAGNOSIS — J449 Chronic obstructive pulmonary disease, unspecified: Secondary | ICD-10-CM | POA: Diagnosis not present

## 2023-12-31 DIAGNOSIS — Z79899 Other long term (current) drug therapy: Secondary | ICD-10-CM | POA: Diagnosis not present

## 2023-12-31 DIAGNOSIS — Z7951 Long term (current) use of inhaled steroids: Secondary | ICD-10-CM | POA: Diagnosis not present

## 2023-12-31 DIAGNOSIS — I10 Essential (primary) hypertension: Secondary | ICD-10-CM | POA: Diagnosis not present

## 2023-12-31 DIAGNOSIS — D469 Myelodysplastic syndrome, unspecified: Secondary | ICD-10-CM | POA: Diagnosis not present

## 2024-01-01 DIAGNOSIS — Z5111 Encounter for antineoplastic chemotherapy: Secondary | ICD-10-CM | POA: Diagnosis not present

## 2024-01-01 DIAGNOSIS — C9200 Acute myeloblastic leukemia, not having achieved remission: Secondary | ICD-10-CM | POA: Diagnosis not present

## 2024-01-03 DIAGNOSIS — C9200 Acute myeloblastic leukemia, not having achieved remission: Secondary | ICD-10-CM | POA: Diagnosis not present

## 2024-01-07 DIAGNOSIS — M4134 Thoracogenic scoliosis, thoracic region: Secondary | ICD-10-CM | POA: Diagnosis not present

## 2024-01-07 DIAGNOSIS — M9902 Segmental and somatic dysfunction of thoracic region: Secondary | ICD-10-CM | POA: Diagnosis not present

## 2024-01-07 DIAGNOSIS — M9903 Segmental and somatic dysfunction of lumbar region: Secondary | ICD-10-CM | POA: Diagnosis not present

## 2024-01-07 DIAGNOSIS — M62838 Other muscle spasm: Secondary | ICD-10-CM | POA: Diagnosis not present

## 2024-01-07 DIAGNOSIS — M9901 Segmental and somatic dysfunction of cervical region: Secondary | ICD-10-CM | POA: Diagnosis not present

## 2024-01-07 DIAGNOSIS — Q763 Congenital scoliosis due to congenital bony malformation: Secondary | ICD-10-CM | POA: Diagnosis not present

## 2024-01-08 DIAGNOSIS — Z09 Encounter for follow-up examination after completed treatment for conditions other than malignant neoplasm: Secondary | ICD-10-CM | POA: Diagnosis not present

## 2024-01-08 DIAGNOSIS — C9200 Acute myeloblastic leukemia, not having achieved remission: Secondary | ICD-10-CM | POA: Diagnosis not present

## 2024-01-10 DIAGNOSIS — C9200 Acute myeloblastic leukemia, not having achieved remission: Secondary | ICD-10-CM | POA: Diagnosis not present

## 2024-01-14 DIAGNOSIS — Z006 Encounter for examination for normal comparison and control in clinical research program: Secondary | ICD-10-CM | POA: Diagnosis not present

## 2024-01-14 DIAGNOSIS — C9200 Acute myeloblastic leukemia, not having achieved remission: Secondary | ICD-10-CM | POA: Diagnosis not present

## 2024-01-17 DIAGNOSIS — Z09 Encounter for follow-up examination after completed treatment for conditions other than malignant neoplasm: Secondary | ICD-10-CM | POA: Diagnosis not present

## 2024-01-17 DIAGNOSIS — C9200 Acute myeloblastic leukemia, not having achieved remission: Secondary | ICD-10-CM | POA: Diagnosis not present

## 2024-01-21 DIAGNOSIS — Z006 Encounter for examination for normal comparison and control in clinical research program: Secondary | ICD-10-CM | POA: Diagnosis not present

## 2024-01-21 DIAGNOSIS — C9200 Acute myeloblastic leukemia, not having achieved remission: Secondary | ICD-10-CM | POA: Diagnosis not present

## 2024-01-28 DIAGNOSIS — C9110 Chronic lymphocytic leukemia of B-cell type not having achieved remission: Secondary | ICD-10-CM | POA: Diagnosis not present

## 2024-01-28 DIAGNOSIS — C9200 Acute myeloblastic leukemia, not having achieved remission: Secondary | ICD-10-CM | POA: Diagnosis not present

## 2024-01-28 DIAGNOSIS — C9201 Acute myeloblastic leukemia, in remission: Secondary | ICD-10-CM | POA: Diagnosis not present

## 2024-01-28 DIAGNOSIS — Z006 Encounter for examination for normal comparison and control in clinical research program: Secondary | ICD-10-CM | POA: Diagnosis not present

## 2024-01-31 DIAGNOSIS — M62838 Other muscle spasm: Secondary | ICD-10-CM | POA: Diagnosis not present

## 2024-01-31 DIAGNOSIS — M9902 Segmental and somatic dysfunction of thoracic region: Secondary | ICD-10-CM | POA: Diagnosis not present

## 2024-01-31 DIAGNOSIS — Q763 Congenital scoliosis due to congenital bony malformation: Secondary | ICD-10-CM | POA: Diagnosis not present

## 2024-01-31 DIAGNOSIS — M4134 Thoracogenic scoliosis, thoracic region: Secondary | ICD-10-CM | POA: Diagnosis not present

## 2024-01-31 DIAGNOSIS — M9901 Segmental and somatic dysfunction of cervical region: Secondary | ICD-10-CM | POA: Diagnosis not present

## 2024-01-31 DIAGNOSIS — M9903 Segmental and somatic dysfunction of lumbar region: Secondary | ICD-10-CM | POA: Diagnosis not present

## 2024-02-01 DIAGNOSIS — Z853 Personal history of malignant neoplasm of breast: Secondary | ICD-10-CM | POA: Diagnosis not present

## 2024-02-04 DIAGNOSIS — C9201 Acute myeloblastic leukemia, in remission: Secondary | ICD-10-CM | POA: Diagnosis not present

## 2024-02-04 DIAGNOSIS — Z006 Encounter for examination for normal comparison and control in clinical research program: Secondary | ICD-10-CM | POA: Diagnosis not present

## 2024-02-04 DIAGNOSIS — C9200 Acute myeloblastic leukemia, not having achieved remission: Secondary | ICD-10-CM | POA: Diagnosis not present

## 2024-02-07 DIAGNOSIS — M810 Age-related osteoporosis without current pathological fracture: Secondary | ICD-10-CM | POA: Diagnosis not present

## 2024-02-07 DIAGNOSIS — Z853 Personal history of malignant neoplasm of breast: Secondary | ICD-10-CM | POA: Diagnosis not present

## 2024-02-07 DIAGNOSIS — Z8249 Family history of ischemic heart disease and other diseases of the circulatory system: Secondary | ICD-10-CM | POA: Diagnosis not present

## 2024-02-07 DIAGNOSIS — E785 Hyperlipidemia, unspecified: Secondary | ICD-10-CM | POA: Diagnosis not present

## 2024-02-07 DIAGNOSIS — I10 Essential (primary) hypertension: Secondary | ICD-10-CM | POA: Diagnosis not present

## 2024-02-07 DIAGNOSIS — C9200 Acute myeloblastic leukemia, not having achieved remission: Secondary | ICD-10-CM | POA: Diagnosis not present

## 2024-02-07 DIAGNOSIS — C9201 Acute myeloblastic leukemia, in remission: Secondary | ICD-10-CM | POA: Diagnosis not present

## 2024-02-07 DIAGNOSIS — J449 Chronic obstructive pulmonary disease, unspecified: Secondary | ICD-10-CM | POA: Diagnosis not present

## 2024-02-07 DIAGNOSIS — Z923 Personal history of irradiation: Secondary | ICD-10-CM | POA: Diagnosis not present

## 2024-02-07 DIAGNOSIS — Z006 Encounter for examination for normal comparison and control in clinical research program: Secondary | ICD-10-CM | POA: Diagnosis not present

## 2024-02-07 DIAGNOSIS — Z79899 Other long term (current) drug therapy: Secondary | ICD-10-CM | POA: Diagnosis not present

## 2024-02-13 DIAGNOSIS — Z006 Encounter for examination for normal comparison and control in clinical research program: Secondary | ICD-10-CM | POA: Diagnosis not present

## 2024-02-13 DIAGNOSIS — I10 Essential (primary) hypertension: Secondary | ICD-10-CM | POA: Diagnosis not present

## 2024-02-13 DIAGNOSIS — D7589 Other specified diseases of blood and blood-forming organs: Secondary | ICD-10-CM | POA: Diagnosis not present

## 2024-02-13 DIAGNOSIS — Z792 Long term (current) use of antibiotics: Secondary | ICD-10-CM | POA: Diagnosis not present

## 2024-02-13 DIAGNOSIS — Z8249 Family history of ischemic heart disease and other diseases of the circulatory system: Secondary | ICD-10-CM | POA: Diagnosis not present

## 2024-02-13 DIAGNOSIS — J449 Chronic obstructive pulmonary disease, unspecified: Secondary | ICD-10-CM | POA: Diagnosis not present

## 2024-02-13 DIAGNOSIS — M810 Age-related osteoporosis without current pathological fracture: Secondary | ICD-10-CM | POA: Diagnosis not present

## 2024-02-13 DIAGNOSIS — E785 Hyperlipidemia, unspecified: Secondary | ICD-10-CM | POA: Diagnosis not present

## 2024-02-13 DIAGNOSIS — C9201 Acute myeloblastic leukemia, in remission: Secondary | ICD-10-CM | POA: Diagnosis not present

## 2024-02-13 DIAGNOSIS — C9200 Acute myeloblastic leukemia, not having achieved remission: Secondary | ICD-10-CM | POA: Diagnosis not present

## 2024-02-16 DIAGNOSIS — Z5111 Encounter for antineoplastic chemotherapy: Secondary | ICD-10-CM | POA: Diagnosis not present

## 2024-02-16 DIAGNOSIS — C9200 Acute myeloblastic leukemia, not having achieved remission: Secondary | ICD-10-CM | POA: Diagnosis not present

## 2024-02-19 DIAGNOSIS — C9200 Acute myeloblastic leukemia, not having achieved remission: Secondary | ICD-10-CM | POA: Diagnosis not present

## 2024-02-19 DIAGNOSIS — Z5111 Encounter for antineoplastic chemotherapy: Secondary | ICD-10-CM | POA: Diagnosis not present

## 2024-02-19 DIAGNOSIS — Z006 Encounter for examination for normal comparison and control in clinical research program: Secondary | ICD-10-CM | POA: Diagnosis not present

## 2024-02-25 DIAGNOSIS — M4134 Thoracogenic scoliosis, thoracic region: Secondary | ICD-10-CM | POA: Diagnosis not present

## 2024-02-25 DIAGNOSIS — M62838 Other muscle spasm: Secondary | ICD-10-CM | POA: Diagnosis not present

## 2024-02-25 DIAGNOSIS — M9903 Segmental and somatic dysfunction of lumbar region: Secondary | ICD-10-CM | POA: Diagnosis not present

## 2024-02-25 DIAGNOSIS — M9902 Segmental and somatic dysfunction of thoracic region: Secondary | ICD-10-CM | POA: Diagnosis not present

## 2024-02-25 DIAGNOSIS — Q763 Congenital scoliosis due to congenital bony malformation: Secondary | ICD-10-CM | POA: Diagnosis not present

## 2024-02-25 DIAGNOSIS — M9901 Segmental and somatic dysfunction of cervical region: Secondary | ICD-10-CM | POA: Diagnosis not present

## 2024-02-27 DIAGNOSIS — C9200 Acute myeloblastic leukemia, not having achieved remission: Secondary | ICD-10-CM | POA: Diagnosis not present

## 2024-03-03 DIAGNOSIS — M9902 Segmental and somatic dysfunction of thoracic region: Secondary | ICD-10-CM | POA: Diagnosis not present

## 2024-03-03 DIAGNOSIS — M9901 Segmental and somatic dysfunction of cervical region: Secondary | ICD-10-CM | POA: Diagnosis not present

## 2024-03-03 DIAGNOSIS — M4134 Thoracogenic scoliosis, thoracic region: Secondary | ICD-10-CM | POA: Diagnosis not present

## 2024-03-03 DIAGNOSIS — Q763 Congenital scoliosis due to congenital bony malformation: Secondary | ICD-10-CM | POA: Diagnosis not present

## 2024-03-03 DIAGNOSIS — M9903 Segmental and somatic dysfunction of lumbar region: Secondary | ICD-10-CM | POA: Diagnosis not present

## 2024-03-04 DIAGNOSIS — Z006 Encounter for examination for normal comparison and control in clinical research program: Secondary | ICD-10-CM | POA: Diagnosis not present

## 2024-03-04 DIAGNOSIS — C9200 Acute myeloblastic leukemia, not having achieved remission: Secondary | ICD-10-CM | POA: Diagnosis not present

## 2024-03-10 DIAGNOSIS — Z79899 Other long term (current) drug therapy: Secondary | ICD-10-CM | POA: Diagnosis not present

## 2024-03-10 DIAGNOSIS — J449 Chronic obstructive pulmonary disease, unspecified: Secondary | ICD-10-CM | POA: Diagnosis not present

## 2024-03-10 DIAGNOSIS — C9200 Acute myeloblastic leukemia, not having achieved remission: Secondary | ICD-10-CM | POA: Diagnosis not present

## 2024-03-10 DIAGNOSIS — E785 Hyperlipidemia, unspecified: Secondary | ICD-10-CM | POA: Diagnosis not present

## 2024-03-10 DIAGNOSIS — I10 Essential (primary) hypertension: Secondary | ICD-10-CM | POA: Diagnosis not present

## 2024-03-17 DIAGNOSIS — E785 Hyperlipidemia, unspecified: Secondary | ICD-10-CM | POA: Diagnosis not present

## 2024-03-17 DIAGNOSIS — I10 Essential (primary) hypertension: Secondary | ICD-10-CM | POA: Diagnosis not present

## 2024-03-17 DIAGNOSIS — Z006 Encounter for examination for normal comparison and control in clinical research program: Secondary | ICD-10-CM | POA: Diagnosis not present

## 2024-03-17 DIAGNOSIS — Z79899 Other long term (current) drug therapy: Secondary | ICD-10-CM | POA: Diagnosis not present

## 2024-03-17 DIAGNOSIS — M810 Age-related osteoporosis without current pathological fracture: Secondary | ICD-10-CM | POA: Diagnosis not present

## 2024-03-17 DIAGNOSIS — D696 Thrombocytopenia, unspecified: Secondary | ICD-10-CM | POA: Diagnosis not present

## 2024-03-17 DIAGNOSIS — M9903 Segmental and somatic dysfunction of lumbar region: Secondary | ICD-10-CM | POA: Diagnosis not present

## 2024-03-17 DIAGNOSIS — M9901 Segmental and somatic dysfunction of cervical region: Secondary | ICD-10-CM | POA: Diagnosis not present

## 2024-03-17 DIAGNOSIS — C9200 Acute myeloblastic leukemia, not having achieved remission: Secondary | ICD-10-CM | POA: Diagnosis not present

## 2024-03-17 DIAGNOSIS — M9902 Segmental and somatic dysfunction of thoracic region: Secondary | ICD-10-CM | POA: Diagnosis not present

## 2024-03-17 DIAGNOSIS — D709 Neutropenia, unspecified: Secondary | ICD-10-CM | POA: Diagnosis not present

## 2024-03-17 DIAGNOSIS — C9000 Multiple myeloma not having achieved remission: Secondary | ICD-10-CM | POA: Diagnosis not present

## 2024-03-17 DIAGNOSIS — J449 Chronic obstructive pulmonary disease, unspecified: Secondary | ICD-10-CM | POA: Diagnosis not present

## 2024-03-17 DIAGNOSIS — M4134 Thoracogenic scoliosis, thoracic region: Secondary | ICD-10-CM | POA: Diagnosis not present

## 2024-03-17 DIAGNOSIS — Q763 Congenital scoliosis due to congenital bony malformation: Secondary | ICD-10-CM | POA: Diagnosis not present

## 2024-03-24 DIAGNOSIS — I10 Essential (primary) hypertension: Secondary | ICD-10-CM | POA: Diagnosis not present

## 2024-03-24 DIAGNOSIS — K5900 Constipation, unspecified: Secondary | ICD-10-CM | POA: Diagnosis not present

## 2024-03-24 DIAGNOSIS — E559 Vitamin D deficiency, unspecified: Secondary | ICD-10-CM | POA: Diagnosis not present

## 2024-03-24 DIAGNOSIS — M810 Age-related osteoporosis without current pathological fracture: Secondary | ICD-10-CM | POA: Diagnosis not present

## 2024-03-24 DIAGNOSIS — E785 Hyperlipidemia, unspecified: Secondary | ICD-10-CM | POA: Diagnosis not present

## 2024-03-24 DIAGNOSIS — M7989 Other specified soft tissue disorders: Secondary | ICD-10-CM | POA: Diagnosis not present

## 2024-03-24 DIAGNOSIS — C9200 Acute myeloblastic leukemia, not having achieved remission: Secondary | ICD-10-CM | POA: Diagnosis not present

## 2024-03-24 DIAGNOSIS — Z006 Encounter for examination for normal comparison and control in clinical research program: Secondary | ICD-10-CM | POA: Diagnosis not present

## 2024-03-24 DIAGNOSIS — Z5111 Encounter for antineoplastic chemotherapy: Secondary | ICD-10-CM | POA: Diagnosis not present

## 2024-03-24 DIAGNOSIS — Z79899 Other long term (current) drug therapy: Secondary | ICD-10-CM | POA: Diagnosis not present

## 2024-03-24 DIAGNOSIS — J449 Chronic obstructive pulmonary disease, unspecified: Secondary | ICD-10-CM | POA: Diagnosis not present

## 2024-03-24 DIAGNOSIS — D7589 Other specified diseases of blood and blood-forming organs: Secondary | ICD-10-CM | POA: Diagnosis not present

## 2024-03-25 DIAGNOSIS — Z5111 Encounter for antineoplastic chemotherapy: Secondary | ICD-10-CM | POA: Diagnosis not present

## 2024-03-25 DIAGNOSIS — C9200 Acute myeloblastic leukemia, not having achieved remission: Secondary | ICD-10-CM | POA: Diagnosis not present

## 2024-03-26 DIAGNOSIS — C9200 Acute myeloblastic leukemia, not having achieved remission: Secondary | ICD-10-CM | POA: Diagnosis not present

## 2024-03-26 DIAGNOSIS — Z5111 Encounter for antineoplastic chemotherapy: Secondary | ICD-10-CM | POA: Diagnosis not present

## 2024-03-27 DIAGNOSIS — C9200 Acute myeloblastic leukemia, not having achieved remission: Secondary | ICD-10-CM | POA: Diagnosis not present

## 2024-03-28 DIAGNOSIS — C9200 Acute myeloblastic leukemia, not having achieved remission: Secondary | ICD-10-CM | POA: Diagnosis not present

## 2024-03-31 DIAGNOSIS — M9902 Segmental and somatic dysfunction of thoracic region: Secondary | ICD-10-CM | POA: Diagnosis not present

## 2024-03-31 DIAGNOSIS — Q763 Congenital scoliosis due to congenital bony malformation: Secondary | ICD-10-CM | POA: Diagnosis not present

## 2024-03-31 DIAGNOSIS — M4134 Thoracogenic scoliosis, thoracic region: Secondary | ICD-10-CM | POA: Diagnosis not present

## 2024-03-31 DIAGNOSIS — M62838 Other muscle spasm: Secondary | ICD-10-CM | POA: Diagnosis not present

## 2024-03-31 DIAGNOSIS — M9903 Segmental and somatic dysfunction of lumbar region: Secondary | ICD-10-CM | POA: Diagnosis not present

## 2024-03-31 DIAGNOSIS — M9901 Segmental and somatic dysfunction of cervical region: Secondary | ICD-10-CM | POA: Diagnosis not present

## 2024-04-01 DIAGNOSIS — C9200 Acute myeloblastic leukemia, not having achieved remission: Secondary | ICD-10-CM | POA: Diagnosis not present

## 2024-04-03 DIAGNOSIS — C9200 Acute myeloblastic leukemia, not having achieved remission: Secondary | ICD-10-CM | POA: Diagnosis not present

## 2024-04-07 DIAGNOSIS — M9902 Segmental and somatic dysfunction of thoracic region: Secondary | ICD-10-CM | POA: Diagnosis not present

## 2024-04-07 DIAGNOSIS — M4134 Thoracogenic scoliosis, thoracic region: Secondary | ICD-10-CM | POA: Diagnosis not present

## 2024-04-07 DIAGNOSIS — Q763 Congenital scoliosis due to congenital bony malformation: Secondary | ICD-10-CM | POA: Diagnosis not present

## 2024-04-07 DIAGNOSIS — M9901 Segmental and somatic dysfunction of cervical region: Secondary | ICD-10-CM | POA: Diagnosis not present

## 2024-04-07 DIAGNOSIS — M9903 Segmental and somatic dysfunction of lumbar region: Secondary | ICD-10-CM | POA: Diagnosis not present

## 2024-04-08 DIAGNOSIS — C9200 Acute myeloblastic leukemia, not having achieved remission: Secondary | ICD-10-CM | POA: Diagnosis not present

## 2024-04-10 DIAGNOSIS — C9200 Acute myeloblastic leukemia, not having achieved remission: Secondary | ICD-10-CM | POA: Diagnosis not present

## 2024-04-14 DIAGNOSIS — M9902 Segmental and somatic dysfunction of thoracic region: Secondary | ICD-10-CM | POA: Diagnosis not present

## 2024-04-14 DIAGNOSIS — M9903 Segmental and somatic dysfunction of lumbar region: Secondary | ICD-10-CM | POA: Diagnosis not present

## 2024-04-14 DIAGNOSIS — M9901 Segmental and somatic dysfunction of cervical region: Secondary | ICD-10-CM | POA: Diagnosis not present

## 2024-04-14 DIAGNOSIS — M4134 Thoracogenic scoliosis, thoracic region: Secondary | ICD-10-CM | POA: Diagnosis not present

## 2024-04-14 DIAGNOSIS — Q763 Congenital scoliosis due to congenital bony malformation: Secondary | ICD-10-CM | POA: Diagnosis not present

## 2024-04-15 DIAGNOSIS — C9200 Acute myeloblastic leukemia, not having achieved remission: Secondary | ICD-10-CM | POA: Diagnosis not present

## 2024-04-17 DIAGNOSIS — C9200 Acute myeloblastic leukemia, not having achieved remission: Secondary | ICD-10-CM | POA: Diagnosis not present

## 2024-04-21 DIAGNOSIS — C9200 Acute myeloblastic leukemia, not having achieved remission: Secondary | ICD-10-CM | POA: Diagnosis not present

## 2024-04-21 DIAGNOSIS — Z79899 Other long term (current) drug therapy: Secondary | ICD-10-CM | POA: Diagnosis not present

## 2024-04-21 DIAGNOSIS — D61810 Antineoplastic chemotherapy induced pancytopenia: Secondary | ICD-10-CM | POA: Diagnosis not present

## 2024-04-21 DIAGNOSIS — T451X5A Adverse effect of antineoplastic and immunosuppressive drugs, initial encounter: Secondary | ICD-10-CM | POA: Diagnosis not present

## 2024-04-21 DIAGNOSIS — E7800 Pure hypercholesterolemia, unspecified: Secondary | ICD-10-CM | POA: Diagnosis not present

## 2024-04-21 DIAGNOSIS — J449 Chronic obstructive pulmonary disease, unspecified: Secondary | ICD-10-CM | POA: Diagnosis not present

## 2024-04-21 DIAGNOSIS — E8770 Fluid overload, unspecified: Secondary | ICD-10-CM | POA: Diagnosis not present

## 2024-04-21 DIAGNOSIS — Z5111 Encounter for antineoplastic chemotherapy: Secondary | ICD-10-CM | POA: Diagnosis not present

## 2024-04-21 DIAGNOSIS — G629 Polyneuropathy, unspecified: Secondary | ICD-10-CM | POA: Diagnosis not present

## 2024-04-21 DIAGNOSIS — I10 Essential (primary) hypertension: Secondary | ICD-10-CM | POA: Diagnosis not present

## 2024-04-21 NOTE — Progress Notes (Signed)
 Rebecca Mitchell has been contacted in regards to their refill of venetoclax . At this time, they have declined refill due to patient having over 40 doses remaining due to medication was dose reduced. Refill assessment call date has been updated per the patient's request.    She is now taking Venetoclax  100 mg daily (1 tablet daily) x 14 days of each cycle.  Current   cycle started 04/21/24.    Suanne Minahan, PharmD  [facility]

## 2024-04-22 DIAGNOSIS — Z5111 Encounter for antineoplastic chemotherapy: Secondary | ICD-10-CM | POA: Diagnosis not present

## 2024-04-22 DIAGNOSIS — C9200 Acute myeloblastic leukemia, not having achieved remission: Secondary | ICD-10-CM | POA: Diagnosis not present

## 2024-04-23 ENCOUNTER — Telehealth: Payer: Self-pay | Admitting: Internal Medicine

## 2024-04-23 DIAGNOSIS — C9200 Acute myeloblastic leukemia, not having achieved remission: Secondary | ICD-10-CM | POA: Diagnosis not present

## 2024-04-23 DIAGNOSIS — Z5111 Encounter for antineoplastic chemotherapy: Secondary | ICD-10-CM | POA: Diagnosis not present

## 2024-04-23 NOTE — Telephone Encounter (Signed)
 Spoke with patient in regard to referral sent from [facility] (Patient previously saw Dr. Clista). She stated that she would like to transfer care back to our facility but would like to wait until Sept. She requested I call back at the end of Aug to coordinate.   Responded to referring office (Allyson Roney) with update via email.

## 2024-04-24 DIAGNOSIS — C9200 Acute myeloblastic leukemia, not having achieved remission: Secondary | ICD-10-CM | POA: Diagnosis not present

## 2024-04-24 DIAGNOSIS — Z5111 Encounter for antineoplastic chemotherapy: Secondary | ICD-10-CM | POA: Diagnosis not present

## 2024-04-25 DIAGNOSIS — C9200 Acute myeloblastic leukemia, not having achieved remission: Secondary | ICD-10-CM | POA: Diagnosis not present

## 2024-04-25 NOTE — Progress Notes (Signed)
 Pt arrived to chair 4 in outpatient oncology infusion for cycle 4 day 5 of treatment. Treatment completed, no concerns. Pt discharged ambulatory. AVS declined

## 2024-04-28 DIAGNOSIS — C9200 Acute myeloblastic leukemia, not having achieved remission: Secondary | ICD-10-CM | POA: Diagnosis not present

## 2024-04-28 NOTE — Progress Notes (Signed)
 Pt hand off received from Alyssa, Charity fundraiser. Pt to infusion chair 46 to complete PRBC transfusion. Pt completed treatment with out complaints or concerns. PIV removed. Pt discharged ambulatory with spouse. AVS declined.

## 2024-04-28 NOTE — Progress Notes (Signed)
 Patient arrived ambulatory to clinic for lab check and possible transfusion.  Treatment plan & Labs reviewed. Hgb 7.3 and Platelets 9.  Parameters met for transfusion today of 2 units pRBCs and 1 unit of platelets.   PIV  accessed prior to arrival;. + blood return and Saline locked,.  1 units of platelets transfused, platelet redraw resulted in platelets of 30. 1 unit of pRBCs completed, patient's second unit of pRBCs transfusing at time of transfer.  Handoff report given to Harlene, RN and patient transferred to chair 46. Remainder of care endorsed to [place], [place].

## 2024-05-01 NOTE — Progress Notes (Signed)
 [facility] Central Navigation Program: Follow-Up Assessment [phone])  Completed with: Patient     Oncology Patient Navigator (OPN) received incoming call from patient; she was due for pro-active outreach to review previously identified barriers/resources discussed, so this was completed during call.     Psychosocial: Rebecca Mitchell continues to report that she is coping effectively and she denies any current needs/concerns. She expressed appreciation for the continued support from her husband and other family members. OPN reminded her of resources available through [facility] if needed.     Practical/Logistical: Patient reports no changes; will continue to monitor.    Financial: Rebecca Mitchell shared that she received a reimbursement check from the PAF copay program recently and this had been very helpful. She requests that OPN assist with monitoring the timeline for when she can submit next claim and help with that process.     Medical/Home/Physical Functioning: Rebecca Mitchell reports that she is feeling well and has been able to start walking laps at her church gym again for exercise. She says the she feels good and denies any current medical concerns/needs.     Patient endorsed understanding of upcoming appointment times/location. She understands how to contact medical team/triage regarding symptoms/medical issues and OPN for any non-clinical needs.    OPN will provide follow up as appropriate per navigation model and patient needs.

## 2024-05-05 DIAGNOSIS — C9200 Acute myeloblastic leukemia, not having achieved remission: Secondary | ICD-10-CM | POA: Diagnosis not present

## 2024-05-05 NOTE — Progress Notes (Signed)
 Pt here for lab check   Labs within range   Hgb 10/ plt 17. No transfusion needed   PIV removed   AVS printed and provided  Ambulatory for dc home

## 2024-05-12 DIAGNOSIS — C9200 Acute myeloblastic leukemia, not having achieved remission: Secondary | ICD-10-CM | POA: Diagnosis not present

## 2024-05-12 NOTE — Progress Notes (Signed)
 Pt arrived to infusion for lab check. No new complaints noted. No transfusion indicated today per therapy plan. PIV removed, AVS provided and pt discharged to home stable and ambulatory.

## 2024-05-13 ENCOUNTER — Other Ambulatory Visit: Payer: Self-pay | Admitting: Family Medicine

## 2024-05-13 DIAGNOSIS — E782 Mixed hyperlipidemia: Secondary | ICD-10-CM

## 2024-05-13 DIAGNOSIS — Z85828 Personal history of other malignant neoplasm of skin: Secondary | ICD-10-CM | POA: Diagnosis not present

## 2024-05-13 DIAGNOSIS — Z872 Personal history of diseases of the skin and subcutaneous tissue: Secondary | ICD-10-CM | POA: Diagnosis not present

## 2024-05-13 DIAGNOSIS — L578 Other skin changes due to chronic exposure to nonionizing radiation: Secondary | ICD-10-CM | POA: Diagnosis not present

## 2024-05-13 DIAGNOSIS — I10 Essential (primary) hypertension: Secondary | ICD-10-CM

## 2024-05-13 DIAGNOSIS — Z859 Personal history of malignant neoplasm, unspecified: Secondary | ICD-10-CM | POA: Diagnosis not present

## 2024-05-14 DIAGNOSIS — C9200 Acute myeloblastic leukemia, not having achieved remission: Secondary | ICD-10-CM | POA: Diagnosis not present

## 2024-05-14 NOTE — Telephone Encounter (Signed)
 Requested medication (s) are due for refill today: routing for review  Requested medication (s) are on the active medication list: no  Last refill:  11/20/23  Future visit scheduled: yes  Notes to clinic:  Unable to refill per protocol, Rx expired. Historical medication      Requested Prescriptions  Pending Prescriptions Disp Refills   amLODipine  (NORVASC ) 5 MG tablet [Pharmacy Med Name: amLODIPine  Besylate 5 MG Oral Tablet] 100 tablet 2    Sig: TAKE 1 TABLET BY MOUTH DAILY     Cardiovascular: Calcium Channel Blockers 2 Failed - 05/14/2024 10:54 AM      Failed - Last BP in normal range    BP Readings from Last 1 Encounters:  11/30/23 (!) 146/68         Passed - Last Heart Rate in normal range    Pulse Readings from Last 1 Encounters:  11/30/23 69         Passed - Valid encounter within last 6 months    Recent Outpatient Visits           5 months ago Symptomatic anemia   [place] [facility] [place], Marsa PARAS, DO       Future Appointments             In 4 weeks [place], Marsa PARAS, DO [place] [facility], [address]            Signed Prescriptions Disp Refills   metoprolol  tartrate (LOPRESSOR ) 25 MG tablet 200 tablet 0    Sig: TAKE 1 TABLET BY MOUTH TWICE  DAILY     Cardiovascular:  Beta Blockers Failed - 05/14/2024 10:54 AM      Failed - Last BP in normal range    BP Readings from Last 1 Encounters:  11/30/23 (!) 146/68         Passed - Last Heart Rate in normal range    Pulse Readings from Last 1 Encounters:  11/30/23 69         Passed - Valid encounter within last 6 months    Recent Outpatient Visits           5 months ago Symptomatic anemia   [place] [facility] [place], Marsa PARAS, DO       Future Appointments             In 4 weeks Edman, Marsa PARAS, DO [place] [facility], [address]             pravastatin  (PRAVACHOL ) 20 MG tablet 100  tablet 0    Sig: TAKE 1 TABLET BY MOUTH DAILY     Cardiovascular:  Antilipid - Statins Failed - 05/14/2024 10:54 AM      Failed - Lipid Panel in normal range within the last 12 months    Cholesterol, Total  Date Value Ref Range Status  02/08/2016 165 100 - 199 mg/dL Final   Cholesterol  Date Value Ref Range Status  05/28/2023 165 <200 mg/dL Final   LDL Cholesterol (Calc)  Date Value Ref Range Status  05/28/2023 81 mg/dL (calc) Final    Comment:    Reference range: <100 . Desirable range <100 mg/dL for primary prevention;   <70 mg/dL for patients with CHD or diabetic patients  with > or = 2 CHD risk factors. SABRA LDL-C is now calculated using the Martin-Hopkins  calculation, which is a validated novel method providing  better accuracy than the [employer]  equation in the  estimation of LDL-C.  Gladis APPLETHWAITE et al. SANDREA. 7986;689(80): 2061-2068  (http://education.QuestDiagnostics.com/faq/FAQ164)    HDL  Date Value Ref Range Status  05/28/2023 68 > OR = 50 mg/dL Final  94/76/7982 79 >60 mg/dL Final   Triglycerides  Date Value Ref Range Status  05/28/2023 78 <150 mg/dL Final         Passed - Patient is not pregnant      Passed - Valid encounter within last 12 months    Recent Outpatient Visits           5 months ago Symptomatic anemia   [place] [facility] [place], Marsa PARAS, DO       Future Appointments             In 4 weeks Edman, Marsa PARAS, DO [place] [facility], [address]

## 2024-05-14 NOTE — Telephone Encounter (Signed)
 Requested Prescriptions  Pending Prescriptions Disp Refills   amLODipine  (NORVASC ) 5 MG tablet [Pharmacy Med Name: amLODIPine  Besylate 5 MG Oral Tablet] 100 tablet 2    Sig: TAKE 1 TABLET BY MOUTH DAILY     Cardiovascular: Calcium Channel Blockers 2 Failed - 05/14/2024 10:52 AM      Failed - Last BP in normal range    BP Readings from Last 1 Encounters:  11/30/23 (!) 146/68         Passed - Last Heart Rate in normal range    Pulse Readings from Last 1 Encounters:  11/30/23 69         Passed - Valid encounter within last 6 months    Recent Outpatient Visits           5 months ago Symptomatic anemia   [place] [facility] [place], Marsa PARAS, DO       Future Appointments             In 4 weeks Edman, Marsa PARAS, DO [place] [facility], [address]             metoprolol  tartrate (LOPRESSOR ) 25 MG tablet [Pharmacy Med Name: Metoprolol  Tartrate 25 MG Oral Tablet] 200 tablet 0    Sig: TAKE 1 TABLET BY MOUTH TWICE  DAILY     Cardiovascular:  Beta Blockers Failed - 05/14/2024 10:52 AM      Failed - Last BP in normal range    BP Readings from Last 1 Encounters:  11/30/23 (!) 146/68         Passed - Last Heart Rate in normal range    Pulse Readings from Last 1 Encounters:  11/30/23 69         Passed - Valid encounter within last 6 months    Recent Outpatient Visits           5 months ago Symptomatic anemia   [place] [facility] [place], Marsa PARAS, DO       Future Appointments             In 4 weeks Edman, Marsa PARAS, DO [place] [facility], [address]             pravastatin  (PRAVACHOL ) 20 MG tablet [Pharmacy Med Name: Pravastatin  Sodium 20 MG Oral Tablet] 100 tablet 0    Sig: TAKE 1 TABLET BY MOUTH DAILY     Cardiovascular:  Antilipid - Statins Failed - 05/14/2024 10:52 AM      Failed - Lipid Panel in normal range within the last 12 months    Cholesterol,  Total  Date Value Ref Range Status  02/08/2016 165 100 - 199 mg/dL Final   Cholesterol  Date Value Ref Range Status  05/28/2023 165 <200 mg/dL Final   LDL Cholesterol (Calc)  Date Value Ref Range Status  05/28/2023 81 mg/dL (calc) Final    Comment:    Reference range: <100 . Desirable range <100 mg/dL for primary prevention;   <70 mg/dL for patients with CHD or diabetic patients  with > or = 2 CHD risk factors. SABRA LDL-C is now calculated using the Martin-Hopkins  calculation, which is a validated novel method providing  better accuracy than the Friedewald equation in the  estimation of LDL-C.  Gladis APPLETHWAITE et al. SANDREA. 7986;689(80): 2061-2068  (http://education.[employer].com/faq/FAQ164)    HDL  Date Value Ref Range Status  05/28/2023 68 > OR = 50 mg/dL Final  94/76/7982  79 >39 mg/dL Final   Triglycerides  Date Value Ref Range Status  05/28/2023 78 <150 mg/dL Final         Passed - Patient is not pregnant      Passed - Valid encounter within last 12 months    Recent Outpatient Visits           5 months ago Symptomatic anemia   [place] [facility] [place], Marsa PARAS, DO       Future Appointments             In 4 weeks Edman, Marsa PARAS, DO [place] [facility], [address]

## 2024-05-20 DIAGNOSIS — M4134 Thoracogenic scoliosis, thoracic region: Secondary | ICD-10-CM | POA: Diagnosis not present

## 2024-05-20 DIAGNOSIS — M9902 Segmental and somatic dysfunction of thoracic region: Secondary | ICD-10-CM | POA: Diagnosis not present

## 2024-05-20 DIAGNOSIS — M9901 Segmental and somatic dysfunction of cervical region: Secondary | ICD-10-CM | POA: Diagnosis not present

## 2024-05-20 DIAGNOSIS — M62838 Other muscle spasm: Secondary | ICD-10-CM | POA: Diagnosis not present

## 2024-05-20 DIAGNOSIS — M9903 Segmental and somatic dysfunction of lumbar region: Secondary | ICD-10-CM | POA: Diagnosis not present

## 2024-05-20 DIAGNOSIS — Q763 Congenital scoliosis due to congenital bony malformation: Secondary | ICD-10-CM | POA: Diagnosis not present

## 2024-05-27 ENCOUNTER — Telehealth: Payer: Self-pay | Admitting: *Deleted

## 2024-05-27 ENCOUNTER — Encounter: Payer: Self-pay | Admitting: Family Medicine

## 2024-05-27 NOTE — Telephone Encounter (Signed)
 I spoke with Dr. Juliann team. Md Agreeable to holding infusion chair on Friday for patient for possible plt transfusion. Patient's lab apt changed to 930 am on Thursday. I personally contacted the patient and reviewed the plan of care with patient. She understands to come at 930 am on Thursday 9/11 for lab and see Dr. Jacobo after the lab. Apt provided for possible plt transfusion on 9/12 at 1145. Pt thanked me for assisting her.

## 2024-05-27 NOTE — Telephone Encounter (Signed)
 Patient to see Dr. Jacobo on Thursday. She has seen Dr. Clista in the past and been getting her AML treatments at [facility]. She stated that most likely she may need a plt transfusion by Friday. Would we be able to see if we can hold a chair in infusion for possible plts. If no chair available, she states that can go to [facility] on Friday for the blood.

## 2024-05-29 ENCOUNTER — Inpatient Hospital Stay: Attending: Oncology | Admitting: Oncology

## 2024-05-29 ENCOUNTER — Inpatient Hospital Stay

## 2024-05-29 ENCOUNTER — Other Ambulatory Visit: Payer: Self-pay | Admitting: *Deleted

## 2024-05-29 ENCOUNTER — Encounter: Payer: Self-pay | Admitting: Oncology

## 2024-05-29 VITALS — BP 128/60 | HR 83 | Temp 97.3°F | Resp 16 | Wt 115.4 lb

## 2024-05-29 DIAGNOSIS — Z803 Family history of malignant neoplasm of breast: Secondary | ICD-10-CM | POA: Insufficient documentation

## 2024-05-29 DIAGNOSIS — Z923 Personal history of irradiation: Secondary | ICD-10-CM | POA: Diagnosis not present

## 2024-05-29 DIAGNOSIS — Z79899 Other long term (current) drug therapy: Secondary | ICD-10-CM | POA: Diagnosis not present

## 2024-05-29 DIAGNOSIS — Z86000 Personal history of in-situ neoplasm of breast: Secondary | ICD-10-CM | POA: Insufficient documentation

## 2024-05-29 DIAGNOSIS — C9200 Acute myeloblastic leukemia, not having achieved remission: Secondary | ICD-10-CM | POA: Insufficient documentation

## 2024-05-29 DIAGNOSIS — D649 Anemia, unspecified: Secondary | ICD-10-CM

## 2024-05-29 DIAGNOSIS — Z5111 Encounter for antineoplastic chemotherapy: Secondary | ICD-10-CM | POA: Insufficient documentation

## 2024-05-29 LAB — CBC WITH DIFFERENTIAL/PLATELET
Abs Immature Granulocytes: 0.12 K/uL — ABNORMAL HIGH (ref 0.00–0.07)
Basophils Absolute: 0 K/uL (ref 0.0–0.1)
Basophils Relative: 0 %
Eosinophils Absolute: 0 K/uL (ref 0.0–0.5)
Eosinophils Relative: 0 %
HCT: 24 % — ABNORMAL LOW (ref 36.0–46.0)
Hemoglobin: 8 g/dL — ABNORMAL LOW (ref 12.0–15.0)
Immature Granulocytes: 5 %
Lymphocytes Relative: 15 %
Lymphs Abs: 0.4 K/uL — ABNORMAL LOW (ref 0.7–4.0)
MCH: 29.2 pg (ref 26.0–34.0)
MCHC: 33.3 g/dL (ref 30.0–36.0)
MCV: 87.6 fL (ref 80.0–100.0)
Monocytes Absolute: 0.7 K/uL (ref 0.1–1.0)
Monocytes Relative: 30 %
Neutro Abs: 1.2 K/uL — ABNORMAL LOW (ref 1.7–7.7)
Neutrophils Relative %: 50 %
Platelets: 31 K/uL — ABNORMAL LOW (ref 150–400)
RBC: 2.74 MIL/uL — ABNORMAL LOW (ref 3.87–5.11)
RDW: 15.6 % — ABNORMAL HIGH (ref 11.5–15.5)
WBC: 2.5 K/uL — ABNORMAL LOW (ref 4.0–10.5)
nRBC: 2.8 % — ABNORMAL HIGH (ref 0.0–0.2)

## 2024-05-29 LAB — SAMPLE TO BLOOD BANK

## 2024-05-29 NOTE — Progress Notes (Signed)
 [facility]  Telephone:(336) [phone] Fax:(336) [phone]  ID: Lauraine DELENA Halsted OB: 06/14/1941  MR#: [id number]  RDW#:[id number]  Patient Care Team: Edman Marsa PARAS, DO as PCP - General (Family Medicine) Lenn Aran, MD as Referring Physician (Radiation Oncology) Damian Therisa HERO, MD as Physician Assistant (Endocrinology)  CHIEF COMPLAINT: AML   INTERVAL HISTORY: Patient is an 83 year old female who was diagnosed with AML in March 2025 and has been receiving treatment at [facility] since that time.  She has now completed 4 cycles of subcutaneous Vidaza as well as oral venetoclax and is referred back for continued treatment.  She will be scheduled for cycle 5 next week.  She currently feels well and is asymptomatic.  She is tolerating her treatments without significant side effects.  She has no neurologic complaints.  She denies any recent fevers or illnesses.  She has a good appetite and denies weight loss.  She has no chest pain, shortness of breath, cough, or hemoptysis.  She denies any nausea, vomiting, constipation, or diarrhea.  She has no urinary complaints.  Patient offers no specific complaints today.  REVIEW OF SYSTEMS:   Review of Systems  Constitutional: Negative.  Negative for fever, malaise/fatigue and weight loss.  Respiratory: Negative.  Negative for cough, hemoptysis and shortness of breath.   Cardiovascular: Negative.  Negative for chest pain and leg swelling.  Gastrointestinal: Negative.  Negative for abdominal pain.  Genitourinary: Negative.  Negative for dysuria.  Musculoskeletal: Negative.  Negative for back pain.  Skin: Negative.  Negative for rash.  Neurological: Negative.  Negative for dizziness, focal weakness, weakness and headaches.  Psychiatric/Behavioral: Negative.  The patient is not nervous/anxious.     As per HPI. Otherwise, a complete review of systems is negative.  PAST MEDICAL HISTORY: Past Medical History:  Diagnosis Date   Anemia     Breast cancer (HCC) 09/2014   left breast lumpectomy with rad tx   Hyperlipidemia    Malignant neoplasm of lower-inner quadrant of left female breast (HCC) 07/2014   5 mm,T1a, N0; ER/ PR positive, HER-2/neu negative   Osteoporosis    Personal history of radiation therapy 2016   left breast ca    PAST SURGICAL HISTORY: Past Surgical History:  Procedure Laterality Date   BREAST BIOPSY Left 08/2014   invasive mammary carcinoma   BREAST LUMPECTOMY Left 10/01/2014   invasive mammary carcinoma and DCIS with clear margins   BREAST SURGERY Left 10/01/2014   lumpectomy   COLONOSCOPY  2010   COLONOSCOPY WITH PROPOFOL  N/A 09/21/2015   Procedure: COLONOSCOPY WITH PROPOFOL ;  Surgeon: Louanne KANDICE Muse, MD;  Location: [facility] ENDOSCOPY;  Service: Endoscopy;  Laterality: N/A;   IR BONE MARROW BIOPSY & ASPIRATION  11/19/2023   UPPER GI ENDOSCOPY  2014    FAMILY HISTORY: Family History  Problem Relation Age of Onset   Breast cancer Sister 6   Heart disease Mother    Heart disease Father     ADVANCED DIRECTIVES (Y/N):  N  HEALTH MAINTENANCE: Social History   Tobacco Use   Smoking status: Never   Smokeless tobacco: Never  Vaping Use   Vaping status: Never Used  Substance Use Topics   Alcohol use: No    Alcohol/week: 0.0 standard drinks of alcohol   Drug use: No     Colonoscopy:  PAP:  Bone density:  Lipid panel:  No Known Allergies  Current Outpatient Medications  Medication Sig Dispense Refill   amLODipine  (NORVASC ) 5 MG tablet TAKE 1 TABLET BY  MOUTH DAILY 100 tablet 3   Ascorbic Acid (VITAMIN C) 1000 MG tablet Take 1,000 mg by mouth daily.     calcium carbonate 100 mg/ml SUSP Take by mouth.     Cholecalciferol (VITAMIN D3) 10 MCG (400 UNIT) CAPS Take by mouth.     co-enzyme Q-10 30 MG capsule Take 30 mg by mouth 3 (three) times daily.     FERROUS SULFATE PO Take 1 tablet by mouth once a week.     Lutein-Zeaxanthin 15-0.7 MG CAPS Take 1 capsule by mouth daily.      metoprolol  tartrate (LOPRESSOR ) 25 MG tablet TAKE 1 TABLET BY MOUTH TWICE  DAILY 200 tablet 0   Omega-3 Fatty Acids (FISH OIL PO) Take 1 capsule by mouth daily.     pravastatin  (PRAVACHOL ) 20 MG tablet TAKE 1 TABLET BY MOUTH DAILY 100 tablet 0   Saccharomyces boulardii (PROBIOTIC) 250 MG CAPS Take 250 mg by mouth daily.     No current facility-administered medications for this visit.    OBJECTIVE: Vitals:   05/29/24 1004  BP: 128/60  Pulse: 83  Resp: 16  Temp: (!) 97.3 F (36.3 C)  SpO2: 100%     Body mass index is 20.44 kg/m.    ECOG FS:0 - Asymptomatic  General: Well-developed, well-nourished, no acute distress. Eyes: Pink conjunctiva, anicteric sclera. HEENT: Normocephalic, moist mucous membranes. Lungs: No audible wheezing or coughing. Heart: Regular rate and rhythm. Abdomen: Soft, nontender, no obvious distention. Musculoskeletal: No edema, cyanosis, or clubbing. Neuro: Alert, answering all questions appropriately. Cranial nerves grossly intact. Skin: No rashes or petechiae noted. Psych: Normal affect. Lymphatics: No cervical, calvicular, axillary or inguinal LAD.   LAB RESULTS:  Lab Results  Component Value Date   NA 136 11/27/2023   K 3.8 11/27/2023   CL 105 11/27/2023   CO2 25 11/27/2023   GLUCOSE 111 (H) 11/27/2023   BUN 15 11/27/2023   CREATININE 0.67 11/27/2023   CALCIUM 9.0 11/27/2023   PROT 6.7 11/27/2023   ALBUMIN 3.6 11/27/2023   AST 17 11/27/2023   ALT 14 11/27/2023   ALKPHOS 47 11/27/2023   BILITOT 0.9 11/27/2023   GFRNONAA >60 11/27/2023   GFRAA 98 05/10/2020    Lab Results  Component Value Date   WBC 2.5 (L) 05/29/2024   NEUTROABS 1.2 (L) 05/29/2024   HGB 8.0 (L) 05/29/2024   HCT 24.0 (L) 05/29/2024   MCV 87.6 05/29/2024   PLT 31 (L) 05/29/2024     STUDIES: No results found.  ASSESSMENT: AML.  PLAN:    AML: Patient completed her first 4 cycles of Vidaza and venetoclax at [facility].  Her most recent bone marrow biopsy on May 14, 2024 reported a hypercellular bone marrow (90%) with marked fibrosis and erythroid predominant hematopoiesis and 4% blasts.  Her original bone marrow biopsy on November 19, 2023 reported 8% blasts.  Patient plans to continue to follow-up at [facility] periodically.  Return to clinic on Monday, June 02, 2024 to initiate cycle 1, day 1 of Vidaza. Anemia: Patient's hemoglobin is decreased, but stable at 8.0.  She does not require transfusion tomorrow.  All blood products must be irradiated. Thrombocytopenia: Patient's platelet count is improved to 31.  She does not require platelet transfusion at this time. Leukopenia: Patient's total white blood cell count is 2.5.  Monitor.  I spent a total of 30 minutes reviewing chart data, face-to-face evaluation with the patient, counseling and coordination of care as detailed above.  Patient expressed understanding and was  in agreement with this plan. She also understands that She can call clinic at any time with any questions, concerns, or complaints.    Cancer Staging  AML (acute myelogenous leukemia) (HCC) Staging form: Acute Myeloid Leukemia, AJCC 8th Edition - Clinical stage from 05/29/2024: Zubrod performance status: 0, Cytogenetics (20 metaphase): Unknown, NPM1-, CEBPA-, FLT3- - Signed by Jacobo Evalene PARAS, MD on 05/29/2024 Stage prefix: Initial diagnosis   Evalene PARAS Jacobo, MD   05/29/2024 12:32 PM

## 2024-05-29 NOTE — Progress Notes (Signed)
 START ON PATHWAY REGIMEN - AML and APL     A cycle is every 28 days:     Venetoclax      Azacitidine   **Always confirm dose/schedule in your pharmacy ordering system**  Patient Characteristics: AML, Initial Induction Therapy, Not a Candidate for Intensive Induction Therapy or Lower Intensity Therapy Preferred, No Actionable Biomarkers Disease Subtype: AML Phase of Therapy: Initial Induction Therapy Preferred Therapy Intensity: Not a Candidate for Intensive Therapy Molecular Alteration Present: No Actionable Biomarkers Intent of Therapy: Non-Curative / Palliative Intent, Discussed with Patient

## 2024-05-29 NOTE — Progress Notes (Signed)
 Pharmacist Chemotherapy Monitoring - Initial Assessment    Anticipated start date: 06/02/24   The following has been reviewed per standard work regarding the patient's treatment regimen: The patient's diagnosis, treatment plan and drug doses, and organ/hematologic function Lab orders and baseline tests specific to treatment regimen  The treatment plan start date, drug sequencing, and pre-medications Prior authorization status  Patient's documented medication list, including drug-drug interaction screen and prescriptions for anti-emetics and supportive care specific to the treatment regimen The drug concentrations, fluid compatibility, administration routes, and timing of the medications to be used The patient's access for treatment and lifetime cumulative dose history, if applicable  The patient's medication allergies and previous infusion related reactions, if applicable    AML  vidaza 75mg /m2 d1-5 q28d Follow up needed:  Pending authorization for treatment    Redell JINNY Gaskins, RPH, 05/29/2024  11:24 AM

## 2024-05-30 ENCOUNTER — Inpatient Hospital Stay

## 2024-05-30 ENCOUNTER — Other Ambulatory Visit: Payer: Self-pay

## 2024-06-02 ENCOUNTER — Inpatient Hospital Stay

## 2024-06-02 ENCOUNTER — Encounter: Payer: Self-pay | Admitting: Oncology

## 2024-06-02 ENCOUNTER — Inpatient Hospital Stay: Admitting: Oncology

## 2024-06-02 VITALS — BP 118/58 | HR 78 | Temp 97.9°F | Resp 18 | Wt 113.5 lb

## 2024-06-02 VITALS — BP 102/43 | HR 79

## 2024-06-02 DIAGNOSIS — Z5111 Encounter for antineoplastic chemotherapy: Secondary | ICD-10-CM | POA: Diagnosis not present

## 2024-06-02 DIAGNOSIS — Z803 Family history of malignant neoplasm of breast: Secondary | ICD-10-CM | POA: Diagnosis not present

## 2024-06-02 DIAGNOSIS — C9200 Acute myeloblastic leukemia, not having achieved remission: Secondary | ICD-10-CM

## 2024-06-02 DIAGNOSIS — Z86000 Personal history of in-situ neoplasm of breast: Secondary | ICD-10-CM | POA: Diagnosis not present

## 2024-06-02 DIAGNOSIS — Z923 Personal history of irradiation: Secondary | ICD-10-CM | POA: Diagnosis not present

## 2024-06-02 DIAGNOSIS — Z79899 Other long term (current) drug therapy: Secondary | ICD-10-CM | POA: Diagnosis not present

## 2024-06-02 LAB — BASIC METABOLIC PANEL - CANCER CENTER ONLY
Anion gap: 8 (ref 5–15)
BUN: 23 mg/dL (ref 8–23)
CO2: 26 mmol/L (ref 22–32)
Calcium: 9.2 mg/dL (ref 8.9–10.3)
Chloride: 101 mmol/L (ref 98–111)
Creatinine: 0.6 mg/dL (ref 0.44–1.00)
GFR, Estimated: >60 mL/min (ref >60)
Glucose, Bld: 122 mg/dL — ABNORMAL HIGH (ref 70–99)
Potassium: 4.3 mmol/L (ref 3.5–5.1)
Sodium: 135 mmol/L (ref 135–145)

## 2024-06-02 LAB — CBC WITH DIFFERENTIAL (CANCER CENTER ONLY)
Abs Immature Granulocytes: 0.12 K/uL — ABNORMAL HIGH (ref 0.00–0.07)
Basophils Absolute: 0 K/uL (ref 0.0–0.1)
Basophils Relative: 0 %
Eosinophils Absolute: 0 K/uL (ref 0.0–0.5)
Eosinophils Relative: 0 %
HCT: 23.7 % — ABNORMAL LOW (ref 36.0–46.0)
Hemoglobin: 7.7 g/dL — ABNORMAL LOW (ref 12.0–15.0)
Immature Granulocytes: 4 %
Lymphocytes Relative: 13 %
Lymphs Abs: 0.3 K/uL — ABNORMAL LOW (ref 0.7–4.0)
MCH: 29.1 pg (ref 26.0–34.0)
MCHC: 32.5 g/dL (ref 30.0–36.0)
MCV: 89.4 fL (ref 80.0–100.0)
Monocytes Absolute: 0.7 K/uL (ref 0.1–1.0)
Monocytes Relative: 27 %
Neutro Abs: 1.5 K/uL — ABNORMAL LOW (ref 1.7–7.7)
Neutrophils Relative %: 56 %
Platelet Count: 42 K/uL — ABNORMAL LOW (ref 150–400)
RBC: 2.65 MIL/uL — ABNORMAL LOW (ref 3.87–5.11)
RDW: 16.2 % — ABNORMAL HIGH (ref 11.5–15.5)
WBC Count: 2.7 K/uL — ABNORMAL LOW (ref 4.0–10.5)
nRBC: 4 % — ABNORMAL HIGH (ref 0.0–0.2)

## 2024-06-02 MED ORDER — ONDANSETRON HCL 8 MG PO TABS
8.0000 mg | ORAL_TABLET | Freq: Once | ORAL | Status: AC
  Administered 2024-06-02: 8 mg via ORAL
  Filled 2024-06-02: qty 1

## 2024-06-02 MED ORDER — AZACITIDINE CHEMO SQ INJECTION
75.0000 mg/m2 | Freq: Once | INTRAMUSCULAR | Status: AC
  Administered 2024-06-02: 115 mg via SUBCUTANEOUS
  Filled 2024-06-02: qty 4.6

## 2024-06-02 NOTE — Progress Notes (Unsigned)
 Patient tolerated Vidazza well, no questions/concerns voiced. Refused monitoring post administration. Patient stable at discharge. VSS. AVS given.

## 2024-06-02 NOTE — Patient Instructions (Signed)
 [facility] - A DEPT OF [facility]  Discharge Instructions: Thank you for choosing [facility] to provide your oncology and hematology care.  If you have a lab appointment with the Cancer Center, please go directly to the Cancer Center and check in at the registration area.  Wear comfortable clothing and clothing appropriate for easy access to any Portacath or PICC line.   We strive to give you quality time with your provider. You may need to reschedule your appointment if you arrive late (15 or more minutes).  Arriving late affects you and other patients whose appointments are after yours.  Also, if you miss three or more appointments without notifying the office, you may be dismissed from the clinic at the provider's discretion.      For prescription refill requests, have your pharmacy contact our office and allow 72 hours for refills to be completed.    Today you received the following chemotherapy and/or immunotherapy agents VIDAZA       To help prevent nausea and vomiting after your treatment, we encourage you to take your nausea medication as directed.  BELOW ARE SYMPTOMS THAT SHOULD BE REPORTED IMMEDIATELY: *FEVER GREATER THAN 100.4 F (38 C) OR HIGHER *CHILLS OR SWEATING *NAUSEA AND VOMITING THAT IS NOT CONTROLLED WITH YOUR NAUSEA MEDICATION *UNUSUAL SHORTNESS OF BREATH *UNUSUAL BRUISING OR BLEEDING *URINARY PROBLEMS (pain or burning when urinating, or frequent urination) *BOWEL PROBLEMS (unusual diarrhea, constipation, pain near the anus) TENDERNESS IN MOUTH AND THROAT WITH OR WITHOUT PRESENCE OF ULCERS (sore throat, sores in mouth, or a toothache) UNUSUAL RASH, SWELLING OR PAIN  UNUSUAL VAGINAL DISCHARGE OR ITCHING   Items with * indicate a potential emergency and should be followed up as soon as possible or go to the Emergency Department if any problems should occur.  Please show the CHEMOTHERAPY ALERT CARD or IMMUNOTHERAPY ALERT  CARD at check-in to the Emergency Department and triage nurse.  Should you have questions after your visit or need to cancel or reschedule your appointment, please contact [facility] - A DEPT OF [facility]  [phone] and follow the prompts.  Office hours are 8:00 a.m. to 4:30 p.m. Monday - Friday. Please note that voicemails left after 4:00 p.m. may not be returned until the following business day.  We are closed weekends and major holidays. You have access to a nurse at all times for urgent questions. Please call the main number to the clinic [phone] and follow the prompts.  For any non-urgent questions, you may also contact your provider using MyChart. We now offer e-Visits for anyone 31 and older to request care online for non-urgent symptoms. For details visit [email or web address].   Also download the MyChart app! Go to the app store, search "MyChart", open the app, select [facility], and log in with your MyChart username and password.

## 2024-06-02 NOTE — Progress Notes (Signed)
 [facility]  Telephone:(336) [phone] Fax:(336) [phone]  ID: Rebecca Mitchell Halsted OB: 1941/06/15  MR#: [id number]  RDW#:[id number]  Patient Care Team: Edman Marsa PARAS, DO as PCP - General (Family Medicine) Lenn Aran, MD as Referring Physician (Radiation Oncology) Damian Therisa HERO, MD as Physician Assistant (Endocrinology)  CHIEF COMPLAINT: AML   INTERVAL HISTORY: Patient returns to clinic today for further evaluation and consideration of cycle 5, day 1 of Vidaza  and venetoclax.  She continues to feel well and remains asymptomatic.  She is tolerating her treatments without significant side effects.  She has no neurologic complaints.  She denies any recent fevers or illnesses.  She has a good appetite and denies weight loss.  She has no chest pain, shortness of breath, cough, or hemoptysis.  She denies any nausea, vomiting, constipation, or diarrhea.  She has no urinary complaints.  Patient offers no specific complaints today.  REVIEW OF SYSTEMS:   Review of Systems  Constitutional: Negative.  Negative for fever, malaise/fatigue and weight loss.  Respiratory: Negative.  Negative for cough, hemoptysis and shortness of breath.   Cardiovascular: Negative.  Negative for chest pain and leg swelling.  Gastrointestinal: Negative.  Negative for abdominal pain.  Genitourinary: Negative.  Negative for dysuria.  Musculoskeletal: Negative.  Negative for back pain.  Skin: Negative.  Negative for rash.  Neurological: Negative.  Negative for dizziness, focal weakness, weakness and headaches.  Psychiatric/Behavioral: Negative.  The patient is not nervous/anxious.     As per HPI. Otherwise, a complete review of systems is negative.  PAST MEDICAL HISTORY: Past Medical History:  Diagnosis Date   Anemia    Breast cancer (HCC) 09/2014   left breast lumpectomy with rad tx   Hyperlipidemia    Malignant neoplasm of lower-inner quadrant of left female breast (HCC) 07/2014   5  mm,T1a, N0; ER/ PR positive, HER-2/neu negative   Osteoporosis    Personal history of radiation therapy 2016   left breast ca    PAST SURGICAL HISTORY: Past Surgical History:  Procedure Laterality Date   BREAST BIOPSY Left 08/2014   invasive mammary carcinoma   BREAST LUMPECTOMY Left 10/01/2014   invasive mammary carcinoma and DCIS with clear margins   BREAST SURGERY Left 10/01/2014   lumpectomy   COLONOSCOPY  2010   COLONOSCOPY WITH PROPOFOL  N/A 09/21/2015   Procedure: COLONOSCOPY WITH PROPOFOL ;  Surgeon: Louanne KANDICE Muse, MD;  Location: [facility] ENDOSCOPY;  Service: Endoscopy;  Laterality: N/A;   IR BONE MARROW BIOPSY & ASPIRATION  11/19/2023   UPPER GI ENDOSCOPY  2014    FAMILY HISTORY: Family History  Problem Relation Age of Onset   Breast cancer Sister 53   Heart disease Mother    Heart disease Father     ADVANCED DIRECTIVES (Y/N):  N  HEALTH MAINTENANCE: Social History   Tobacco Use   Smoking status: Never   Smokeless tobacco: Never  Vaping Use   Vaping status: Never Used  Substance Use Topics   Alcohol use: No    Alcohol/week: 0.0 standard drinks of alcohol   Drug use: No     Colonoscopy:  PAP:  Bone density:  Lipid panel:  No Known Allergies  Current Outpatient Medications  Medication Sig Dispense Refill   amLODipine  (NORVASC ) 5 MG tablet TAKE 1 TABLET BY MOUTH DAILY 100 tablet 3   Ascorbic Acid (VITAMIN C) 1000 MG tablet Take 1,000 mg by mouth daily.     calcium carbonate 100 mg/ml SUSP Take by mouth.  Cholecalciferol (VITAMIN D3) 10 MCG (400 UNIT) CAPS Take by mouth.     co-enzyme Q-10 30 MG capsule Take 30 mg by mouth 3 (three) times daily.     FERROUS SULFATE PO Take 1 tablet by mouth once a week.     Lutein-Zeaxanthin 15-0.7 MG CAPS Take 1 capsule by mouth daily.     metoprolol  tartrate (LOPRESSOR ) 25 MG tablet TAKE 1 TABLET BY MOUTH TWICE  DAILY 200 tablet 0   Omega-3 Fatty Acids (FISH OIL PO) Take 1 capsule by mouth daily.     pravastatin   (PRAVACHOL ) 20 MG tablet TAKE 1 TABLET BY MOUTH DAILY 100 tablet 0   Saccharomyces boulardii (PROBIOTIC) 250 MG CAPS Take 250 mg by mouth daily.     No current facility-administered medications for this visit.    OBJECTIVE: Vitals:   06/02/24 1107  BP: (!) 118/58  Pulse: 78  Resp: 18  Temp: 97.9 F (36.6 C)  SpO2: 98%     Body mass index is 20.11 kg/m.    ECOG FS:0 - Asymptomatic  General: Well-developed, well-nourished, no acute distress. Eyes: Pink conjunctiva, anicteric sclera. HEENT: Normocephalic, moist mucous membranes. Lungs: No audible wheezing or coughing. Heart: Regular rate and rhythm. Abdomen: Soft, nontender, no obvious distention. Musculoskeletal: No edema, cyanosis, or clubbing. Neuro: Alert, answering all questions appropriately. Cranial nerves grossly intact. Skin: No rashes or petechiae noted. Psych: Normal affect.   LAB RESULTS:  Lab Results  Component Value Date   NA 135 06/02/2024   K 4.3 06/02/2024   CL 101 06/02/2024   CO2 26 06/02/2024   GLUCOSE 122 (H) 06/02/2024   BUN 23 06/02/2024   CREATININE 0.60 06/02/2024   CALCIUM 9.2 06/02/2024   PROT 6.7 11/27/2023   ALBUMIN 3.6 11/27/2023   AST 17 11/27/2023   ALT 14 11/27/2023   ALKPHOS 47 11/27/2023   BILITOT 0.9 11/27/2023   GFRNONAA >60 06/02/2024   GFRAA 98 05/10/2020    Lab Results  Component Value Date   WBC 2.7 (L) 06/02/2024   NEUTROABS 1.5 (L) 06/02/2024   HGB 7.7 (L) 06/02/2024   HCT 23.7 (L) 06/02/2024   MCV 89.4 06/02/2024   PLT 42 (L) 06/02/2024     STUDIES: No results found.  ASSESSMENT: AML.  PLAN:    AML: Patient completed her first 4 cycles of Vidaza  and venetoclax at [facility].  Her most recent bone marrow biopsy on May 14, 2024 reported a hypercellular bone marrow (90%) with marked fibrosis and erythroid predominant hematopoiesis and 4% blasts.  Her original bone marrow biopsy on November 19, 2023 reported 8% blasts.  Patient plans to continue to follow-up at [facility]  periodically.  Proceed with cycle 5, day 1 of treatment today.  Return to clinic daily for Vidaza  and then on Friday for 1 unit packed red blood cells.  Patient will then return to clinic in 2 weeks for further evaluation and consideration of additional transfusion.   Anemia: Hemoglobin is trended down to 7.7.  Return to clinic on Friday for 1 unit packed red blood cells.  All blood products must be irradiated. Thrombocytopenia: Chronic and unchanged.  Patient's platelet count is 42.  She does not require platelet transfusion at this time. Leukopenia: Unchanged.  Patient's total white blood cell count is 2.7.   Patient expressed understanding and was in agreement with this plan. She also understands that She can call clinic at any time with any questions, concerns, or complaints.    Cancer Staging  AML (acute  myelogenous leukemia) (HCC) Staging form: Acute Myeloid Leukemia, AJCC 8th Edition - Clinical stage from 05/29/2024: Zubrod performance status: 0, Cytogenetics (20 metaphase): Unknown, NPM1-, CEBPA-, FLT3- - Signed by Jacobo Evalene PARAS, MD on 05/29/2024 Stage prefix: Initial diagnosis   Evalene PARAS Jacobo, MD   06/02/2024 11:40 AM

## 2024-06-03 ENCOUNTER — Inpatient Hospital Stay

## 2024-06-03 ENCOUNTER — Other Ambulatory Visit: Payer: Self-pay

## 2024-06-03 VITALS — BP 111/53 | HR 74 | Temp 96.2°F | Resp 19 | Ht 63.39 in | Wt 113.5 lb

## 2024-06-03 DIAGNOSIS — C9200 Acute myeloblastic leukemia, not having achieved remission: Secondary | ICD-10-CM | POA: Diagnosis not present

## 2024-06-03 DIAGNOSIS — Z5111 Encounter for antineoplastic chemotherapy: Secondary | ICD-10-CM | POA: Diagnosis not present

## 2024-06-03 DIAGNOSIS — Z923 Personal history of irradiation: Secondary | ICD-10-CM | POA: Diagnosis not present

## 2024-06-03 DIAGNOSIS — Z79899 Other long term (current) drug therapy: Secondary | ICD-10-CM | POA: Diagnosis not present

## 2024-06-03 DIAGNOSIS — Z86000 Personal history of in-situ neoplasm of breast: Secondary | ICD-10-CM | POA: Diagnosis not present

## 2024-06-03 DIAGNOSIS — Z803 Family history of malignant neoplasm of breast: Secondary | ICD-10-CM | POA: Diagnosis not present

## 2024-06-03 MED ORDER — ONDANSETRON HCL 8 MG PO TABS
8.0000 mg | ORAL_TABLET | Freq: Once | ORAL | Status: AC
  Administered 2024-06-03: 8 mg via ORAL
  Filled 2024-06-03: qty 1

## 2024-06-03 MED ORDER — AZACITIDINE CHEMO SQ INJECTION
75.0000 mg/m2 | Freq: Once | INTRAMUSCULAR | Status: AC
  Administered 2024-06-03: 115 mg via SUBCUTANEOUS
  Filled 2024-06-03: qty 4.6

## 2024-06-04 ENCOUNTER — Inpatient Hospital Stay

## 2024-06-04 VITALS — BP 113/50 | HR 72 | Temp 98.2°F | Resp 18

## 2024-06-04 DIAGNOSIS — Z923 Personal history of irradiation: Secondary | ICD-10-CM | POA: Diagnosis not present

## 2024-06-04 DIAGNOSIS — Z5111 Encounter for antineoplastic chemotherapy: Secondary | ICD-10-CM | POA: Diagnosis not present

## 2024-06-04 DIAGNOSIS — C9200 Acute myeloblastic leukemia, not having achieved remission: Secondary | ICD-10-CM

## 2024-06-04 DIAGNOSIS — Z79899 Other long term (current) drug therapy: Secondary | ICD-10-CM | POA: Diagnosis not present

## 2024-06-04 DIAGNOSIS — Z86000 Personal history of in-situ neoplasm of breast: Secondary | ICD-10-CM | POA: Diagnosis not present

## 2024-06-04 DIAGNOSIS — Z803 Family history of malignant neoplasm of breast: Secondary | ICD-10-CM | POA: Diagnosis not present

## 2024-06-04 MED ORDER — ONDANSETRON HCL 8 MG PO TABS
8.0000 mg | ORAL_TABLET | Freq: Once | ORAL | Status: AC
  Administered 2024-06-04: 8 mg via ORAL
  Filled 2024-06-04: qty 1

## 2024-06-04 MED ORDER — AZACITIDINE CHEMO SQ INJECTION
75.0000 mg/m2 | Freq: Once | INTRAMUSCULAR | Status: AC
  Administered 2024-06-04: 115 mg via SUBCUTANEOUS
  Filled 2024-06-04: qty 4.6

## 2024-06-04 NOTE — Patient Instructions (Signed)
 [facility] - A DEPT OF . [facility]  Discharge Instructions: Thank you for choosing [facility] to provide your oncology and hematology care.  If you have a lab appointment with the Cancer Center, please go directly to the Cancer Center and check in at the registration area.  Wear comfortable clothing and clothing appropriate for easy access to any Portacath or PICC line.   We strive to give you quality time with your provider. You may need to reschedule your appointment if you arrive late (15 or more minutes).  Arriving late affects you and other patients whose appointments are after yours.  Also, if you miss three or more appointments without notifying the office, you may be dismissed from the clinic at the provider's discretion.      For prescription refill requests, have your pharmacy contact our office and allow 72 hours for refills to be completed.    Today you received the following chemotherapy and/or immunotherapy agents VIDAZA        To help prevent nausea and vomiting after your treatment, we encourage you to take your nausea medication as directed.  BELOW ARE SYMPTOMS THAT SHOULD BE REPORTED IMMEDIATELY: *FEVER GREATER THAN 100.4 F (38 C) OR HIGHER *CHILLS OR SWEATING *NAUSEA AND VOMITING THAT IS NOT CONTROLLED WITH YOUR NAUSEA MEDICATION *UNUSUAL SHORTNESS OF BREATH *UNUSUAL BRUISING OR BLEEDING *URINARY PROBLEMS (pain or burning when urinating, or frequent urination) *BOWEL PROBLEMS (unusual diarrhea, constipation, pain near the anus) TENDERNESS IN MOUTH AND THROAT WITH OR WITHOUT PRESENCE OF ULCERS (sore throat, sores in mouth, or a toothache) UNUSUAL RASH, SWELLING OR PAIN  UNUSUAL VAGINAL DISCHARGE OR ITCHING   Items with * indicate a potential emergency and should be followed up as soon as possible or go to the Emergency Department if any problems should occur.  Please show the CHEMOTHERAPY ALERT CARD or IMMUNOTHERAPY  ALERT CARD at check-in to the Emergency Department and triage nurse.  Should you have questions after your visit or need to cancel or reschedule your appointment, please contact [facility] - A DEPT OF [facility]  [phone] and follow the prompts.  Office hours are 8:00 a.m. to 4:30 p.m. Monday - Friday. Please note that voicemails left after 4:00 p.m. may not be returned until the following business day.  We are closed weekends and major holidays. You have access to a nurse at all times for urgent questions. Please call the main number to the clinic [phone] and follow the prompts.  For any non-urgent questions, you may also contact your provider using MyChart. We now offer e-Visits for anyone 77 and older to request care online for non-urgent symptoms. For details visit [email or web address].   Also download the MyChart app! Go to the app store, search MyChart, open the app, select [facility], and log in with your MyChart username and password.  Azacitidine  Injection What is this medication? AZACITIDINE  (ay [employer] i deen) treats blood and bone marrow cancers. It works by slowing down the growth of cancer cells. This medicine may be used for other purposes; ask your health care provider or pharmacist if you have questions. COMMON BRAND NAME(S): Vidaza  What should I tell my care team before I take this medication? They need to know if you have any of these conditions: Kidney disease Liver disease Low blood cell levels, such as low white cells, platelets, or red blood cells Low levels of albumin in  the blood Low levels of bicarbonate in the blood An unusual or allergic reaction to azacitidine , mannitol, other medications, foods, dyes, or preservatives If you or your partner are pregnant or trying to get pregnant Breast-feeding How should I use this medication? This medication is injected into a vein or under the skin. It is given by your care  team in a hospital or clinic setting. Talk to your care team about the use of this medication in children. While it may be prescribed for children as young as 1 month for selected conditions, precautions do apply. Overdosage: If you think you have taken too much of this medicine contact a poison control center or emergency room at once. NOTE: This medicine is only for you. Do not share this medicine with others. What if I miss a dose? Keep appointments for follow-up doses. It is important not to miss your dose. Call your care team if you are unable to keep an appointment. What may interact with this medication? Interactions are not expected. This list may not describe all possible interactions. Give your health care provider a list of all the medicines, herbs, non-prescription drugs, or dietary supplements you use. Also tell them if you smoke, drink alcohol, or use illegal drugs. Some items may interact with your medicine. What should I watch for while using this medication? Your condition will be monitored carefully while you are receiving this medication. This medication may make you feel generally unwell. This is not uncommon as chemotherapy can affect healthy cells as well as cancer cells. Report any side effects. Continue your course of treatment even though you feel ill unless your care team tells you to stop. You may need blood work done while you are taking this medication. Other product types may be available that contain the medication azacitidine . The injection and oral products should not be used in place of one another. Talk to your care team if you have questions. This medication can cause serious side effects. To reduce the risk, your care team may give you other medications to take before receiving this one. Be sure to follow the directions from your care team. This medication may increase your risk of getting an infection. Call your care team for advice if you get a fever, chills, sore  throat, or other symptoms of a cold or flu. Do not treat yourself. Try to avoid being around people who are sick. Avoid taking medications that contain aspirin, acetaminophen , ibuprofen , naproxen, or ketoprofen unless instructed by your care team. These medications may hide a fever. Be careful brushing or flossing your teeth or using a toothpick because you may get an infection or bleed more easily. If you have any dental work done, tell your dentist you are receiving this medication. Talk to your care team if you or your partner may be pregnant. Serious birth defects can occur if you take this medication during pregnancy and for 6 months after the last dose. You will need a negative pregnancy test before starting this medication. Contraception is recommended while taking this medication and for 6 months after the last dose. Your care team can help you find the option that works for you. If your partner can get pregnant, use a condom during sex while taking this medication and for 3 months after the last dose. Do not breastfeed while taking this medication and for 1 week after the last dose. This medication may cause infertility. Talk to your care team if you are concerned about your fertility.  What side effects may I notice from receiving this medication? Side effects that you should report to your care team as soon as possible: Allergic reactions--skin rash, itching, hives, swelling of the face, lips, tongue, or throat Infection--fever, chills, cough, sore throat, wounds that don't heal, pain or trouble when passing urine, general feeling of discomfort or being unwell Kidney injury--decrease in the amount of urine, swelling of the ankles, hands, or feet Liver injury--right upper belly pain, loss of appetite, nausea, light-colored stool, dark yellow or brown urine, yellowing skin or eyes, unusual weakness or fatigue Low red blood cell level--unusual weakness or fatigue, dizziness, headache, trouble  breathing Tumor lysis syndrome (TLS)--nausea, vomiting, diarrhea, decrease in the amount of urine, dark urine, unusual weakness or fatigue, confusion, muscle pain or cramps, fast or irregular heartbeat, joint pain Unusual bruising or bleeding Side effects that usually do not require medical attention (report to your care team if they continue or are bothersome): Constipation Diarrhea Nausea Pain, redness, or irritation at injection site Vomiting This list may not describe all possible side effects. Call your doctor for medical advice about side effects. You may report side effects to FDA at 1-800-FDA-1088. Where should I keep my medication? This medication is given in a hospital or clinic. It will not be stored at home. NOTE: This sheet is a summary. It may not cover all possible information. If you have questions about this medicine, talk to your doctor, pharmacist, or health care provider.  2024 Elsevier/Gold Standard (2023-05-07 00:00:00)

## 2024-06-05 ENCOUNTER — Inpatient Hospital Stay

## 2024-06-05 ENCOUNTER — Other Ambulatory Visit: Payer: Self-pay | Admitting: *Deleted

## 2024-06-05 VITALS — BP 112/48 | HR 76 | Temp 97.2°F | Resp 18

## 2024-06-05 DIAGNOSIS — Z79899 Other long term (current) drug therapy: Secondary | ICD-10-CM | POA: Diagnosis not present

## 2024-06-05 DIAGNOSIS — Z5111 Encounter for antineoplastic chemotherapy: Secondary | ICD-10-CM | POA: Diagnosis not present

## 2024-06-05 DIAGNOSIS — Z86000 Personal history of in-situ neoplasm of breast: Secondary | ICD-10-CM | POA: Diagnosis not present

## 2024-06-05 DIAGNOSIS — C9200 Acute myeloblastic leukemia, not having achieved remission: Secondary | ICD-10-CM

## 2024-06-05 DIAGNOSIS — Z923 Personal history of irradiation: Secondary | ICD-10-CM | POA: Diagnosis not present

## 2024-06-05 DIAGNOSIS — Z803 Family history of malignant neoplasm of breast: Secondary | ICD-10-CM | POA: Diagnosis not present

## 2024-06-05 LAB — CBC WITH DIFFERENTIAL/PLATELET
Abs Immature Granulocytes: 0.03 K/uL (ref 0.00–0.07)
Band Neutrophils: 1 %
Basophils Absolute: 0 K/uL (ref 0.0–0.1)
Basophils Relative: 1 %
Blasts: 0 %
Eosinophils Absolute: 0 K/uL (ref 0.0–0.5)
Eosinophils Relative: 0 %
HCT: 20.7 % — ABNORMAL LOW (ref 36.0–46.0)
Hemoglobin: 6.7 g/dL — CL (ref 12.0–15.0)
Immature Granulocytes: 1 %
Lymphocytes Relative: 13 %
Lymphs Abs: 0.3 K/uL — ABNORMAL LOW (ref 0.7–4.0)
MCH: 29.4 pg (ref 26.0–34.0)
MCHC: 32.4 g/dL (ref 30.0–36.0)
MCV: 90.8 fL (ref 80.0–100.0)
Metamyelocytes Relative: 0 %
Monocytes Absolute: 0.5 K/uL (ref 0.1–1.0)
Monocytes Relative: 20 %
Myelocytes: 0 %
Neutro Abs: 1.8 K/uL (ref 1.7–7.7)
Neutrophils Relative %: 64 %
Other: 0 %
Platelets: 44 K/uL — ABNORMAL LOW (ref 150–400)
Promyelocytes Relative: 0 %
RBC: 2.28 MIL/uL — ABNORMAL LOW (ref 3.87–5.11)
RDW: 17.2 % — ABNORMAL HIGH (ref 11.5–15.5)
Smear Review: DECREASED
WBC: 2.6 K/uL — ABNORMAL LOW (ref 4.0–10.5)
nRBC: 0 /100{WBCs}
nRBC: 1.2 % — ABNORMAL HIGH (ref 0.0–0.2)

## 2024-06-05 LAB — PREPARE RBC (CROSSMATCH)

## 2024-06-05 MED ORDER — AZACITIDINE CHEMO SQ INJECTION
75.0000 mg/m2 | Freq: Once | INTRAMUSCULAR | Status: AC
  Administered 2024-06-05: 115 mg via SUBCUTANEOUS
  Filled 2024-06-05: qty 4.6

## 2024-06-05 MED ORDER — ONDANSETRON HCL 8 MG PO TABS
8.0000 mg | ORAL_TABLET | Freq: Once | ORAL | Status: AC
  Administered 2024-06-05: 8 mg via ORAL
  Filled 2024-06-05: qty 1

## 2024-06-05 NOTE — Patient Instructions (Signed)
 [facility] - A DEPT OF . [facility]  Discharge Instructions: Thank you for choosing [facility] to provide your oncology and hematology care.  If you have a lab appointment with the Cancer Center, please go directly to the Cancer Center and check in at the registration area.  Wear comfortable clothing and clothing appropriate for easy access to any Portacath or PICC line.   We strive to give you quality time with your provider. You may need to reschedule your appointment if you arrive late (15 or more minutes).  Arriving late affects you and other patients whose appointments are after yours.  Also, if you miss three or more appointments without notifying the office, you may be dismissed from the clinic at the provider's discretion.      For prescription refill requests, have your pharmacy contact our office and allow 72 hours for refills to be completed.    Today you received the following chemotherapy and/or immunotherapy agents VIDAZA        To help prevent nausea and vomiting after your treatment, we encourage you to take your nausea medication as directed.  BELOW ARE SYMPTOMS THAT SHOULD BE REPORTED IMMEDIATELY: *FEVER GREATER THAN 100.4 F (38 C) OR HIGHER *CHILLS OR SWEATING *NAUSEA AND VOMITING THAT IS NOT CONTROLLED WITH YOUR NAUSEA MEDICATION *UNUSUAL SHORTNESS OF BREATH *UNUSUAL BRUISING OR BLEEDING *URINARY PROBLEMS (pain or burning when urinating, or frequent urination) *BOWEL PROBLEMS (unusual diarrhea, constipation, pain near the anus) TENDERNESS IN MOUTH AND THROAT WITH OR WITHOUT PRESENCE OF ULCERS (sore throat, sores in mouth, or a toothache) UNUSUAL RASH, SWELLING OR PAIN  UNUSUAL VAGINAL DISCHARGE OR ITCHING   Items with * indicate a potential emergency and should be followed up as soon as possible or go to the Emergency Department if any problems should occur.  Please show the CHEMOTHERAPY ALERT CARD or IMMUNOTHERAPY  ALERT CARD at check-in to the Emergency Department and triage nurse.  Should you have questions after your visit or need to cancel or reschedule your appointment, please contact [facility] - A DEPT OF [facility]  [phone] and follow the prompts.  Office hours are 8:00 a.m. to 4:30 p.m. Monday - Friday. Please note that voicemails left after 4:00 p.m. may not be returned until the following business day.  We are closed weekends and major holidays. You have access to a nurse at all times for urgent questions. Please call the main number to the clinic [phone] and follow the prompts.  For any non-urgent questions, you may also contact your provider using MyChart. We now offer e-Visits for anyone 77 and older to request care online for non-urgent symptoms. For details visit [email or web address].   Also download the MyChart app! Go to the app store, search MyChart, open the app, select [facility], and log in with your MyChart username and password.  Azacitidine  Injection What is this medication? AZACITIDINE  (ay [employer] i deen) treats blood and bone marrow cancers. It works by slowing down the growth of cancer cells. This medicine may be used for other purposes; ask your health care provider or pharmacist if you have questions. COMMON BRAND NAME(S): Vidaza  What should I tell my care team before I take this medication? They need to know if you have any of these conditions: Kidney disease Liver disease Low blood cell levels, such as low white cells, platelets, or red blood cells Low levels of albumin in  the blood Low levels of bicarbonate in the blood An unusual or allergic reaction to azacitidine , mannitol, other medications, foods, dyes, or preservatives If you or your partner are pregnant or trying to get pregnant Breast-feeding How should I use this medication? This medication is injected into a vein or under the skin. It is given by your care  team in a hospital or clinic setting. Talk to your care team about the use of this medication in children. While it may be prescribed for children as young as 1 month for selected conditions, precautions do apply. Overdosage: If you think you have taken too much of this medicine contact a poison control center or emergency room at once. NOTE: This medicine is only for you. Do not share this medicine with others. What if I miss a dose? Keep appointments for follow-up doses. It is important not to miss your dose. Call your care team if you are unable to keep an appointment. What may interact with this medication? Interactions are not expected. This list may not describe all possible interactions. Give your health care provider a list of all the medicines, herbs, non-prescription drugs, or dietary supplements you use. Also tell them if you smoke, drink alcohol, or use illegal drugs. Some items may interact with your medicine. What should I watch for while using this medication? Your condition will be monitored carefully while you are receiving this medication. This medication may make you feel generally unwell. This is not uncommon as chemotherapy can affect healthy cells as well as cancer cells. Report any side effects. Continue your course of treatment even though you feel ill unless your care team tells you to stop. You may need blood work done while you are taking this medication. Other product types may be available that contain the medication azacitidine . The injection and oral products should not be used in place of one another. Talk to your care team if you have questions. This medication can cause serious side effects. To reduce the risk, your care team may give you other medications to take before receiving this one. Be sure to follow the directions from your care team. This medication may increase your risk of getting an infection. Call your care team for advice if you get a fever, chills, sore  throat, or other symptoms of a cold or flu. Do not treat yourself. Try to avoid being around people who are sick. Avoid taking medications that contain aspirin, acetaminophen , ibuprofen , naproxen, or ketoprofen unless instructed by your care team. These medications may hide a fever. Be careful brushing or flossing your teeth or using a toothpick because you may get an infection or bleed more easily. If you have any dental work done, tell your dentist you are receiving this medication. Talk to your care team if you or your partner may be pregnant. Serious birth defects can occur if you take this medication during pregnancy and for 6 months after the last dose. You will need a negative pregnancy test before starting this medication. Contraception is recommended while taking this medication and for 6 months after the last dose. Your care team can help you find the option that works for you. If your partner can get pregnant, use a condom during sex while taking this medication and for 3 months after the last dose. Do not breastfeed while taking this medication and for 1 week after the last dose. This medication may cause infertility. Talk to your care team if you are concerned about your fertility.  What side effects may I notice from receiving this medication? Side effects that you should report to your care team as soon as possible: Allergic reactions--skin rash, itching, hives, swelling of the face, lips, tongue, or throat Infection--fever, chills, cough, sore throat, wounds that don't heal, pain or trouble when passing urine, general feeling of discomfort or being unwell Kidney injury--decrease in the amount of urine, swelling of the ankles, hands, or feet Liver injury--right upper belly pain, loss of appetite, nausea, light-colored stool, dark yellow or brown urine, yellowing skin or eyes, unusual weakness or fatigue Low red blood cell level--unusual weakness or fatigue, dizziness, headache, trouble  breathing Tumor lysis syndrome (TLS)--nausea, vomiting, diarrhea, decrease in the amount of urine, dark urine, unusual weakness or fatigue, confusion, muscle pain or cramps, fast or irregular heartbeat, joint pain Unusual bruising or bleeding Side effects that usually do not require medical attention (report to your care team if they continue or are bothersome): Constipation Diarrhea Nausea Pain, redness, or irritation at injection site Vomiting This list may not describe all possible side effects. Call your doctor for medical advice about side effects. You may report side effects to FDA at 1-800-FDA-1088. Where should I keep my medication? This medication is given in a hospital or clinic. It will not be stored at home. NOTE: This sheet is a summary. It may not cover all possible information. If you have questions about this medicine, talk to your doctor, pharmacist, or health care provider.  2024 Elsevier/Gold Standard (2023-05-07 00:00:00)

## 2024-06-06 ENCOUNTER — Inpatient Hospital Stay

## 2024-06-06 VITALS — BP 118/56 | HR 84 | Temp 97.6°F | Resp 18

## 2024-06-06 DIAGNOSIS — C9200 Acute myeloblastic leukemia, not having achieved remission: Secondary | ICD-10-CM | POA: Diagnosis not present

## 2024-06-06 DIAGNOSIS — Z5111 Encounter for antineoplastic chemotherapy: Secondary | ICD-10-CM | POA: Diagnosis not present

## 2024-06-06 DIAGNOSIS — Z79899 Other long term (current) drug therapy: Secondary | ICD-10-CM | POA: Diagnosis not present

## 2024-06-06 DIAGNOSIS — Z803 Family history of malignant neoplasm of breast: Secondary | ICD-10-CM | POA: Diagnosis not present

## 2024-06-06 DIAGNOSIS — Z923 Personal history of irradiation: Secondary | ICD-10-CM | POA: Diagnosis not present

## 2024-06-06 DIAGNOSIS — Z86000 Personal history of in-situ neoplasm of breast: Secondary | ICD-10-CM | POA: Diagnosis not present

## 2024-06-06 MED ORDER — SODIUM CHLORIDE 0.9% IV SOLUTION
250.0000 mL | INTRAVENOUS | Status: DC
  Filled 2024-06-06: qty 250

## 2024-06-06 MED ORDER — ONDANSETRON HCL 8 MG PO TABS
8.0000 mg | ORAL_TABLET | Freq: Once | ORAL | Status: AC
  Administered 2024-06-06: 8 mg via ORAL
  Filled 2024-06-06: qty 1

## 2024-06-06 MED ORDER — DIPHENHYDRAMINE HCL 50 MG/ML IJ SOLN: 25.0000 mg | Freq: Once | INTRAMUSCULAR | Status: DC

## 2024-06-06 MED ORDER — AZACITIDINE CHEMO SQ INJECTION
75.0000 mg/m2 | Freq: Once | INTRAMUSCULAR | Status: AC
  Administered 2024-06-06: 115 mg via SUBCUTANEOUS
  Filled 2024-06-06: qty 4.6

## 2024-06-06 MED ORDER — ACETAMINOPHEN 325 MG PO TABS: 650.0000 mg | ORAL_TABLET | Freq: Once | ORAL | Status: DC

## 2024-06-06 NOTE — Patient Instructions (Signed)
 [facility] - A DEPT OF [facility]  Discharge Instructions: Thank you for choosing [facility] to provide your oncology and hematology care.  If you have a lab appointment with the Cancer Center, please go directly to the Cancer Center and check in at the registration area.  Wear comfortable clothing and clothing appropriate for easy access to any Portacath or PICC line.   We strive to give you quality time with your provider. You may need to reschedule your appointment if you arrive late (15 or more minutes).  Arriving late affects you and other patients whose appointments are after yours.  Also, if you miss three or more appointments without notifying the office, you may be dismissed from the clinic at the provider's discretion.      For prescription refill requests, have your pharmacy contact our office and allow 72 hours for refills to be completed.    Today you received the following chemotherapy and/or immunotherapy agents VIDAZA  and two units blood       To help prevent nausea and vomiting after your treatment, we encourage you to take your nausea medication as directed.  BELOW ARE SYMPTOMS THAT SHOULD BE REPORTED IMMEDIATELY: *FEVER GREATER THAN 100.4 F (38 C) OR HIGHER *CHILLS OR SWEATING *NAUSEA AND VOMITING THAT IS NOT CONTROLLED WITH YOUR NAUSEA MEDICATION *UNUSUAL SHORTNESS OF BREATH *UNUSUAL BRUISING OR BLEEDING *URINARY PROBLEMS (pain or burning when urinating, or frequent urination) *BOWEL PROBLEMS (unusual diarrhea, constipation, pain near the anus) TENDERNESS IN MOUTH AND THROAT WITH OR WITHOUT PRESENCE OF ULCERS (sore throat, sores in mouth, or a toothache) UNUSUAL RASH, SWELLING OR PAIN  UNUSUAL VAGINAL DISCHARGE OR ITCHING   Items with * indicate a potential emergency and should be followed up as soon as possible or go to the Emergency Department if any problems should occur.  Please show the CHEMOTHERAPY ALERT CARD  or IMMUNOTHERAPY ALERT CARD at check-in to the Emergency Department and triage nurse.  Should you have questions after your visit or need to cancel or reschedule your appointment, please contact [facility] - A DEPT OF [facility]  [phone] and follow the prompts.  Office hours are 8:00 a.m. to 4:30 p.m. Monday - Friday. Please note that voicemails left after 4:00 p.m. may not be returned until the following business day.  We are closed weekends and major holidays. You have access to a nurse at all times for urgent questions. Please call the main number to the clinic [phone] and follow the prompts.  For any non-urgent questions, you may also contact your provider using MyChart. We now offer e-Visits for anyone 59 and older to request care online for non-urgent symptoms. For details visit [email or web address].   Also download the MyChart app! Go to the app store, search MyChart, open the app, select [facility], and log in with your MyChart username and password.   Azacitidine  Injection What is this medication? AZACITIDINE  (ay [employer] i deen) treats blood and bone marrow cancers. It works by slowing down the growth of cancer cells. This medicine may be used for other purposes; ask your health care provider or pharmacist if you have questions. COMMON BRAND NAME(S): Vidaza  What should I tell my care team before I take this medication? They need to know if you have any of these conditions: Kidney disease Liver disease Low blood cell levels, such as low white cells, platelets, or red blood cells  Low levels of albumin in the blood Low levels of bicarbonate in the blood An unusual or allergic reaction to azacitidine , mannitol, other medications, foods, dyes, or preservatives If you or your partner are pregnant or trying to get pregnant Breast-feeding How should I use this medication? This medication is injected into a vein or under the skin. It is  given by your care team in a hospital or clinic setting. Talk to your care team about the use of this medication in children. While it may be prescribed for children as young as 1 month for selected conditions, precautions do apply. Overdosage: If you think you have taken too much of this medicine contact a poison control center or emergency room at once. NOTE: This medicine is only for you. Do not share this medicine with others. What if I miss a dose? Keep appointments for follow-up doses. It is important not to miss your dose. Call your care team if you are unable to keep an appointment. What may interact with this medication? Interactions are not expected. This list may not describe all possible interactions. Give your health care provider a list of all the medicines, herbs, non-prescription drugs, or dietary supplements you use. Also tell them if you smoke, drink alcohol, or use illegal drugs. Some items may interact with your medicine. What should I watch for while using this medication? Your condition will be monitored carefully while you are receiving this medication. This medication may make you feel generally unwell. This is not uncommon as chemotherapy can affect healthy cells as well as cancer cells. Report any side effects. Continue your course of treatment even though you feel ill unless your care team tells you to stop. You may need blood work done while you are taking this medication. Other product types may be available that contain the medication azacitidine . The injection and oral products should not be used in place of one another. Talk to your care team if you have questions. This medication can cause serious side effects. To reduce the risk, your care team may give you other medications to take before receiving this one. Be sure to follow the directions from your care team. This medication may increase your risk of getting an infection. Call your care team for advice if you get a  fever, chills, sore throat, or other symptoms of a cold or flu. Do not treat yourself. Try to avoid being around people who are sick. Avoid taking medications that contain aspirin, acetaminophen , ibuprofen, naproxen , or ketoprofen unless instructed by your care team. These medications may hide a fever. Be careful brushing or flossing your teeth or using a toothpick because you may get an infection or bleed more easily. If you have any dental work done, tell your dentist you are receiving this medication. Talk to your care team if you or your partner may be pregnant. Serious birth defects can occur if you take this medication during pregnancy and for 6 months after the last dose. You will need a negative pregnancy test before starting this medication. Contraception is recommended while taking this medication and for 6 months after the last dose. Your care team can help you find the option that works for you. If your partner can get pregnant, use a condom during sex while taking this medication and for 3 months after the last dose. Do not breastfeed while taking this medication and for 1 week after the last dose. This medication may cause infertility. Talk to your care team if you  are concerned about your fertility. What side effects may I notice from receiving this medication? Side effects that you should report to your care team as soon as possible: Allergic reactions--skin rash, itching, hives, swelling of the face, lips, tongue, or throat Infection--fever, chills, cough, sore throat, wounds that don't heal, pain or trouble when passing urine, general feeling of discomfort or being unwell Kidney injury--decrease in the amount of urine, swelling of the ankles, hands, or feet Liver injury--right upper belly pain, loss of appetite, nausea, light-colored stool, dark yellow or brown urine, yellowing skin or eyes, unusual weakness or fatigue Low red blood cell level--unusual weakness or fatigue, dizziness,  headache, trouble breathing Tumor lysis syndrome (TLS)--nausea, vomiting, diarrhea, decrease in the amount of urine, dark urine, unusual weakness or fatigue, confusion, muscle pain or cramps, fast or irregular heartbeat, joint pain Unusual bruising or bleeding Side effects that usually do not require medical attention (report to your care team if they continue or are bothersome): Constipation Diarrhea Nausea Pain, redness, or irritation at injection site Vomiting This list may not describe all possible side effects. Call your doctor for medical advice about side effects. You may report side effects to FDA at 1-800-FDA-1088. Where should I keep my medication? This medication is given in a hospital or clinic. It will not be stored at home. NOTE: This sheet is a summary. It may not cover all possible information. If you have questions about this medicine, talk to your doctor, pharmacist, or health care provider.  2024 Elsevier/Gold Standard (2023-05-07 00:00:00)  Blood Transfusion, Adult, Care After After a blood transfusion, it is common to have: Bruising and soreness at the IV site. A headache. Follow these instructions at home: Your doctor may give you more instructions. If you have problems, contact your doctor. Insertion site care     Follow instructions from your doctor about how to take care of your insertion site. This is where an IV tube was put into your vein. Make sure you: Wash your hands with soap and water for at least 20 seconds before and after you change your bandage. If you cannot use soap and water, use hand sanitizer. Change your bandage as told by your doctor. Check your insertion site every day for signs of infection. Check for: Redness, swelling, or pain. Bleeding from the site. Warmth. Pus or a bad smell. General instructions Take over-the-counter and prescription medicines only as told by your doctor. Rest as told by your doctor. Go back to your normal  activities as told by your doctor. Keep all follow-up visits. You may need to have tests at certain times to check your blood. Contact a doctor if: You have itching or red, swollen areas of skin (hives). You have a fever or chills. You have pain in the head, back, or chest. You feel worried or nervous (anxious). You feel weak after doing your normal activities. You have any of these problems at the insertion site: Redness, swelling, warmth, or pain. Bleeding that does not stop with pressure. Pus or a bad smell. If you received your blood transfusion in an outpatient setting, you will be told whom to contact to report any reactions. Get help right away if: You have signs of a serious reaction. This may be coming from an allergy or the body's defense system (immune system). Signs include: Trouble breathing or shortness of breath. Swelling of the face or feeling warm (flushed). A widespread rash. Dark pee (urine) or blood in the pee. Fast heartbeat. These symptoms  may be an emergency. Get help right away. Call 911. Do not wait to see if the symptoms will go away. Do not drive yourself to the hospital. Summary Bruising and soreness at the IV site are common. Check your insertion site every day for signs of infection. Rest as told by your doctor. Go back to your normal activities as told by your doctor. Get help right away if you have signs of a serious reaction. This information is not intended to replace advice given to you by your health care provider. Make sure you discuss any questions you have with your health care provider. Document Revised: 12/02/2021 Document Reviewed: 12/02/2021 Elsevier Patient Education  2024 [employer].

## 2024-06-07 LAB — TYPE AND SCREEN
ABO/RH(D): O POS
Antibody Screen: NEGATIVE
Unit division: 0
Unit division: 0

## 2024-06-07 LAB — BPAM RBC
Blood Product Expiration Date: 202510022359
Blood Product Expiration Date: 202510022359
ISSUE DATE / TIME: 202509190918
ISSUE DATE / TIME: 202509191103
Unit Type and Rh: 5100
Unit Type and Rh: 5100

## 2024-06-09 DIAGNOSIS — C9201 Acute myeloblastic leukemia, in remission: Secondary | ICD-10-CM | POA: Diagnosis not present

## 2024-06-09 DIAGNOSIS — R0602 Shortness of breath: Secondary | ICD-10-CM | POA: Diagnosis not present

## 2024-06-09 DIAGNOSIS — R911 Solitary pulmonary nodule: Secondary | ICD-10-CM | POA: Diagnosis not present

## 2024-06-11 ENCOUNTER — Ambulatory Visit (INDEPENDENT_AMBULATORY_CARE_PROVIDER_SITE_OTHER): Payer: Self-pay | Admitting: Family Medicine

## 2024-06-11 ENCOUNTER — Encounter: Payer: Self-pay | Admitting: Family Medicine

## 2024-06-11 VITALS — BP 122/62 | HR 91 | Ht 63.39 in | Wt 114.2 lb

## 2024-06-11 DIAGNOSIS — Z0000 Encounter for general adult medical examination without abnormal findings: Secondary | ICD-10-CM | POA: Diagnosis not present

## 2024-06-11 DIAGNOSIS — R7303 Prediabetes: Secondary | ICD-10-CM

## 2024-06-11 DIAGNOSIS — C9200 Acute myeloblastic leukemia, not having achieved remission: Secondary | ICD-10-CM | POA: Diagnosis not present

## 2024-06-11 DIAGNOSIS — D696 Thrombocytopenia, unspecified: Secondary | ICD-10-CM | POA: Diagnosis not present

## 2024-06-11 DIAGNOSIS — I10 Essential (primary) hypertension: Secondary | ICD-10-CM

## 2024-06-11 DIAGNOSIS — M41124 Adolescent idiopathic scoliosis, thoracic region: Secondary | ICD-10-CM | POA: Diagnosis not present

## 2024-06-11 DIAGNOSIS — E441 Mild protein-calorie malnutrition: Secondary | ICD-10-CM

## 2024-06-11 DIAGNOSIS — D509 Iron deficiency anemia, unspecified: Secondary | ICD-10-CM | POA: Diagnosis not present

## 2024-06-11 DIAGNOSIS — Z23 Encounter for immunization: Secondary | ICD-10-CM

## 2024-06-11 DIAGNOSIS — E782 Mixed hyperlipidemia: Secondary | ICD-10-CM

## 2024-06-11 MED ORDER — METOPROLOL TARTRATE 25 MG PO TABS
25.0000 mg | ORAL_TABLET | Freq: Two times a day (BID) | ORAL | 3 refills | Status: AC
Start: 2024-06-11 — End: ?

## 2024-06-11 MED ORDER — PRAVASTATIN SODIUM 20 MG PO TABS: 20.0000 mg | ORAL_TABLET | Freq: Every day | ORAL | 3 refills | Status: AC

## 2024-06-11 NOTE — Patient Instructions (Addendum)
 Thank you for coming to the office today.  A1c sugar test today Last time in March was mild elevated. But primary focus is maintaining appetite and nutrition in general, keeping protein and nutrients. Do not worry as much about sugar or carb when eating. That is not as important as keeping your weight.  High Dose Flu Shot today  Refilled all meds to [facility] for 1 year  Keep up with Oncology Dr Jacobo and [facility] as planned.  Please schedule a Follow-up Appointment to: Return in about 6 months (around 12/09/2024) for 6 month PreDM A1c, Oncology updates.  If you have any other questions or concerns, please feel free to call the office or send a message through MyChart. You may also schedule an earlier appointment if necessary.  Additionally, you may be receiving a survey about your experience at our office within a few days to 1 week by e-mail or mail. We value your feedback.  Marsa Officer, DO [facility], [place]

## 2024-06-11 NOTE — Progress Notes (Signed)
 Subjective:    Patient ID: Rebecca Mitchell, female    DOB: 12-15-1940, 83 y.o.   MRN: [id number]  Rebecca Mitchell is a 83 y.o. female presenting on 06/11/2024 for Annual Exam   HPI  Discussed the use of AI scribe software for clinical note transcription with the patient, who gave verbal consent to proceed.  History of Present Illness   Rebecca Mitchell is an 83 year old female with leukemia who presents for an annual physical exam.  Protein Calorie Malnutrition Weight changes and nutritional status - Weight loss of approximately ten pounds over the past six months - Weight has been stable recently - Maintains nutrition by eating regular meals and snacks, including fruit and crackers, to prevent an empty stomach  Peripheral edema - Wears compression socks during the day to manage ankle swelling   AML Leukemia Anemia Thrombocytopenia Followed by [facility] Oncology Dr Jacobo, care had been transferred from [facility] to [facility], previously diagnosed in Feb/March 2025 initiated with [facility] Leukemia. Finished infusions and chemotherapy Received 2 units PRBC last week due to anemia  Elevated A1c Elevated A1c to 5.8 up to 6.2 now CBGs: Not checking, not indicated Meds: Never on meds Currently on ARB Lifestyle: - Diet (admits goal to limit excess sugar carb and starch) - Exercise (less regular exercise now) Denies hypoglycemia   HYPERLIPIDEMIA: - Reports no concerns. Last lipid panel 05/2023 TC 165 and LDL 81 Declined repeat lipid panel today - Currently taking Pravastatin  20mg  daily, tolerating well without side effects or myalgias   CHRONIC HTN: Reports no new concerns. Checks BP at home normal range Current Meds - Amlodipine  5mg  daily, Losartan -HCTZ 100-25mg  daily, Metoprolol  25mg  BID Reports good compliance, took meds today. Tolerating well, w/o complaints.   Chronic Scoliosis / Chronic Back Pain - Reports chronic problem with scoliosis since adolescent. Previously followed Dr Royden Schneider (Spine  & Scoliosis Specialist in [place]). Also has seen [facility] Dr Dow, and some back pain and sciatica Currently following w/ [facility] for lung function She takes Tylenol   x2 once or twice a day if needs She tries to rest and avoid straining her back if possible. She does go to chiropractor still frequently with good results. Doing managing pain and soreness. Admits less walking, worse with walking   Anemia On iron 3x  week, doing well, prior problem. Last Hgb mild reduced Hgb 10.5 is stable. Denies any fatigue or weakness or symptoms   History of Breast Cancer Mammogram surveillance per [facility] Previously on Letrozole    Severe Osteoporosis Followed by [facility] Endocrinology Dr Damian - on prolia , followed with DEXA Monitoring    Reduced Hearing, Bilateral Has hearing aid bilateral   Health Maintenance:   Flu Shot today   Decline COVID and Shingrix       06/11/2024    8:36 AM 06/06/2024    9:34 AM 06/05/2024    2:18 PM  Depression screen PHQ 2/9  Decreased Interest 0 0 0  Down, Depressed, Hopeless 0 0 0  PHQ - 2 Score 0 0 0       06/11/2024    8:36 AM 11/20/2023   11:05 AM 06/04/2023    8:55 AM 05/29/2022    9:03 AM  GAD 7 : Generalized Anxiety Score  Nervous, Anxious, on Edge 0 0 0 0  Control/stop worrying 0 0 0 1  Worry too much - different things 0 0 0 1  Trouble relaxing 0 0 0 0  Restless 0 0  0 0  Easily annoyed or irritable 0 0 0 0  Afraid - awful might happen 0 0 0 1  Total GAD 7 Score 0 0 0 3  Anxiety Difficulty  Not difficult at all  Not difficult at all     Past Medical History:  Diagnosis Date   Anemia    Breast cancer (HCC) 09/2014   left breast lumpectomy with rad tx   Hyperlipidemia    Malignant neoplasm of lower-inner quadrant of left female breast (HCC) 07/2014   5 mm,T1a, N0; ER/ PR positive, HER-2/neu negative   Osteoporosis    Personal history of radiation therapy 2016   left breast ca   Past Surgical  History:  Procedure Laterality Date   BREAST BIOPSY Left 08/2014   invasive mammary carcinoma   BREAST LUMPECTOMY Left 10/01/2014   invasive mammary carcinoma and DCIS with clear margins   BREAST SURGERY Left 10/01/2014   lumpectomy   COLONOSCOPY  2010   COLONOSCOPY WITH PROPOFOL  N/A 09/21/2015   Procedure: COLONOSCOPY WITH PROPOFOL ;  Surgeon: Louanne KANDICE Muse, MD;  Location: [facility] ENDOSCOPY;  Service: Endoscopy;  Laterality: N/A;   IR BONE MARROW BIOPSY & ASPIRATION  11/19/2023   UPPER GI ENDOSCOPY  2014   Social History   Socioeconomic History   Marital status: Married    Spouse name: Not on file   Number of children: Not on file   Years of education: Not on file   Highest education level: Bachelor's degree (e.g., BA, AB, BS)  Occupational History   Not on file  Tobacco Use   Smoking status: Never   Smokeless tobacco: Never  Vaping Use   Vaping status: Never Used  Substance and Sexual Activity   Alcohol use: No    Alcohol/week: 0.0 standard drinks of alcohol   Drug use: No   Sexual activity: Not on file  Other Topics Concern   Not on file  Social History Narrative   Not on file   Social Drivers of Health   Financial Resource Strain: Low Risk  (11/30/2023)   Received from [facility]   Overall Financial Resource Strain (CARDIA)    Difficulty of Paying Living Expenses: Not very hard  Food Insecurity: No Food Insecurity (11/30/2023)   Received from [facility]   Hunger Vital Sign    Within the past 12 months, you worried that your food would run out before you got the money to buy more.: Never true    Within the past 12 months, the food you bought just didn't last and you didn't have money to get more.: Never true  Transportation Needs: No Transportation Needs (11/30/2023)   Received from [facility]   PRAPARE - Transportation    Lack of Transportation (Medical): No    Lack of Transportation (Non-Medical): No  Physical Activity: Inactive (03/07/2023)    Exercise Vital Sign    Days of Exercise per Week: 0 days    Minutes of Exercise per Session: 0 min  Stress: No Stress Concern Present (03/07/2023)   [employer] of Occupational Health - Occupational Stress Questionnaire    Feeling of Stress : Not at all  Social Connections: Patient Declined (11/15/2023)   Social Connection and Isolation Panel    Frequency of Communication with Friends and Family: Patient declined    Frequency of Social Gatherings with Friends and Family: Patient declined    Attends Religious Services: Patient declined    Database administrator or Organizations: Patient declined  Attends Banker Meetings: Patient declined    Marital Status: Patient declined  Intimate Partner Violence: Not At Risk (11/20/2023)   Humiliation, Afraid, Rape, and Kick questionnaire    Fear of Current or Ex-Partner: No    Emotionally Abused: No    Physically Abused: No    Sexually Abused: No   Family History  Problem Relation Age of Onset   Breast cancer Sister 54   Heart disease Mother    Heart disease Father    Current Outpatient Medications on File Prior to Visit  Medication Sig   amLODipine  (NORVASC ) 5 MG tablet TAKE 1 TABLET BY MOUTH DAILY   Ascorbic Acid (VITAMIN C) 1000 MG tablet Take 1,000 mg by mouth daily.   calcium carbonate 100 mg/ml SUSP Take by mouth.   cefdinir (OMNICEF) 300 MG capsule Take 300 mg by mouth.   Cholecalciferol (VITAMIN D3) 10 MCG (400 UNIT) CAPS Take by mouth.   co-enzyme Q-10 30 MG capsule Take 30 mg by mouth 3 (three) times daily.   FERROUS SULFATE PO Take 1 tablet by mouth once a week.   fluticasone  (FLONASE ) 50 MCG/ACT nasal spray 1 spray.   hydrochlorothiazide (HYDRODIURIL) 25 MG tablet Take 25 mg by mouth.   Lutein-Zeaxanthin 15-0.7 MG CAPS Take 1 capsule by mouth daily.   Omega-3 Fatty Acids (FISH OIL PO) Take 1 capsule by mouth daily.   ondansetron  (ZOFRAN ) 4 MG tablet Take 4 mg by mouth.   Saccharomyces boulardii (PROBIOTIC)  250 MG CAPS Take 250 mg by mouth daily.   valACYclovir (VALTREX) 500 MG tablet Take 500 mg by mouth daily.   venetoclax (VENCLEXTA) 100 MG tablet Take 1 tablet (100 mg total) by mouth once daily for days 1-14 of a 28 day cycle. Take with a meal and water. Do not chew, crush, or break tablets.   No current facility-administered medications on file prior to visit.    Review of Systems  Constitutional:  Negative for activity change, appetite change, chills, diaphoresis, fatigue and fever.  HENT:  Negative for congestion and hearing loss.   Eyes:  Negative for visual disturbance.  Respiratory:  Negative for cough, chest tightness, shortness of breath and wheezing.   Cardiovascular:  Negative for chest pain, palpitations and leg swelling.  Gastrointestinal:  Negative for abdominal pain, constipation, diarrhea, nausea and vomiting.  Genitourinary:  Negative for dysuria, frequency and hematuria.  Musculoskeletal:  Negative for arthralgias and neck pain.  Skin:  Negative for rash.  Neurological:  Negative for dizziness, weakness, light-headedness, numbness and headaches.  Hematological:  Negative for adenopathy.  Psychiatric/Behavioral:  Negative for behavioral problems, dysphoric mood and sleep disturbance.    Per HPI unless specifically indicated above     Objective:    BP 122/62 (BP Location: Left Arm, Patient Position: Sitting, Cuff Size: Normal)   Pulse 91   Ht 5' 3.39 (1.61 m)   Wt 114 lb 4 oz (51.8 kg)   SpO2 97%   BMI 19.99 kg/m   Wt Readings from Last 3 Encounters:  06/11/24 114 lb 4 oz (51.8 kg)  06/03/24 113 lb 8 oz (51.5 kg)  06/02/24 113 lb 8 oz (51.5 kg)    Physical Exam Vitals and nursing note reviewed.  Constitutional:      General: She is not in acute distress.    Appearance: She is well-developed. She is not diaphoretic.     Comments: Well-appearing, comfortable, cooperative  HENT:     Head: Normocephalic and atraumatic.  Eyes:  General:        Right eye:  No discharge.        Left eye: No discharge.     Conjunctiva/sclera: Conjunctivae normal.     Pupils: Pupils are equal, round, and reactive to light.  Neck:     Thyroid : No thyromegaly.  Cardiovascular:     Rate and Rhythm: Normal rate and regular rhythm.     Pulses: Normal pulses.     Heart sounds: Normal heart sounds. No murmur heard. Pulmonary:     Effort: Pulmonary effort is normal. No respiratory distress.     Breath sounds: Normal breath sounds. No wheezing or rales.  Abdominal:     General: Bowel sounds are normal. There is no distension.     Palpations: Abdomen is soft. There is no mass.     Tenderness: There is no abdominal tenderness.  Musculoskeletal:        General: No tenderness. Normal range of motion.     Cervical back: Normal range of motion and neck supple.     Right lower leg: Edema (+1 pitting edema biilateral ankles, has compression socks on) present.     Left lower leg: Edema present.     Comments: Advanced Dextroscoliosis, stable, chronic  Upper / Lower Extremities: - Normal muscle tone, strength bilateral upper extremities 5/5, lower extremities 5/5  Lymphadenopathy:     Cervical: No cervical adenopathy.  Skin:    General: Skin is warm and dry.     Findings: No erythema or rash.  Neurological:     Mental Status: She is alert and oriented to person, place, and time.     Comments: Distal sensation intact to light touch all extremities  Psychiatric:        Mood and Affect: Mood normal.        Behavior: Behavior normal.        Thought Content: Thought content normal.     Comments: Well groomed, good eye contact, normal speech and thoughts     Results for orders placed or performed in visit on 06/05/24  Type and screen   Collection Time: 06/05/24  2:07 PM  Result Value Ref Range   ABO/RH(D) O POS    Antibody Screen NEG    Sample Expiration 06/08/2024,2359    Unit Number [id number]    Blood Component Type RBC, LR IRR    Unit division 00     Status of Unit ISSUED,FINAL    Transfusion Status OK TO TRANSFUSE    Crossmatch Result      Compatible Performed at [facility], [address], [place], [place] [address]    Unit Number [id number]    Blood Component Type RBC, LR IRR    Unit division 00    Status of Unit ISSUED,FINAL    Transfusion Status OK TO TRANSFUSE    Crossmatch Result Compatible   BPAM RBC   Collection Time: 06/05/24  2:07 PM  Result Value Ref Range   ISSUE DATE / TIME 797490808896    Blood Product Unit Number [id number]    PRODUCT CODE [id number]    Unit Type and Rh 5100    Blood Product Expiration Date 797489977640    ISSUE DATE / TIME 797490809081    Blood Product Unit Number [id number]    PRODUCT CODE [id number]    Unit Type and Rh 5100    Blood Product Expiration Date 797489977640   Prepare RBC (crossmatch)   Collection Time: 06/05/24  4:00 PM  Result Value  Ref Range   Order Confirmation      ORDER PROCESSED BY BLOOD BANK Performed at [facility], [address] [place], [place] [address]       Assessment & Plan:   Problem List Items Addressed This Visit     AML (acute myelogenous leukemia) (HCC)   Relevant Medications   cefdinir (OMNICEF) 300 MG capsule   ondansetron  (ZOFRAN ) 4 MG tablet   valACYclovir (VALTREX) 500 MG tablet   venetoclax (VENCLEXTA) 100 MG tablet   Anemia   Essential hypertension   Relevant Medications   hydrochlorothiazide (HYDRODIURIL) 25 MG tablet   metoprolol  tartrate (LOPRESSOR ) 25 MG tablet   pravastatin  (PRAVACHOL ) 20 MG tablet   HLD (hyperlipidemia)   Relevant Medications   hydrochlorothiazide (HYDRODIURIL) 25 MG tablet   metoprolol  tartrate (LOPRESSOR ) 25 MG tablet   pravastatin  (PRAVACHOL ) 20 MG tablet   Pre-diabetes   Scoliosis   Thrombocytopenia   Other Visit Diagnoses       Annual physical exam    -  Primary     Needs flu shot       Relevant Orders   Flu vaccine HIGH DOSE PF(Fluzone Trivalent) (Completed)     Mild  protein-calorie malnutrition            Updated Health Maintenance information Reviewed recent lab results with patient Encouraged improvement to lifestyle with diet and exercise   AML Leukemia with chemotherapy-induced anemia Followed by Oncology [facility] Dr Jacobo + [facility] Leukemia under active treatment with chemotherapy. Recent blood transfusion for chemotherapy-induced anemia. Managed by oncology team with coordination between local and [facility] oncologists. - Continue current chemotherapy regimen as per oncology team. - Monitor blood counts regularly as per oncology team. - Coordinate care with oncology team at local center and [facility].  Edema of lower extremities Edema improved with compression socks. No additional medication required. - Continue use of compression socks for edema management.  Hypertension Hypertension well controlled with current medication regimen. - Refill amlodipine  and metoprolol  for one year through [facility].  Hyperlipidemia Hyperlipidemia well controlled with current medication regimen. - Refill pravastatin  for one year through [facility].  Impaired fasting glucose Impaired fasting glucose noted. Nutrition and maintaining weight prioritized over strict sugar control. Last A1c mildly elevated Unable to get result on POC A1c today - Emphasize importance of nutrition and maintaining weight over strict sugar control.  Chronic Scoliosis Chronic scoliosis managed conservatively. Has followed with cardio/pulm specialists and prior imaging work up  Adult Wellness Visit Routine adult wellness visit. Discussed general health maintenance, including immunizations and screenings. Weight stable after 10-pound loss over six months. Blood pressure well controlled. - Administer flu shot today. - Perform finger prick for blood sugar today. - Schedule follow-up in six months for wellness check and finger prick sugar test.         Orders Placed This  Encounter  Procedures   Flu vaccine HIGH DOSE PF(Fluzone Trivalent)    Meds ordered this encounter  Medications   metoprolol  tartrate (LOPRESSOR ) 25 MG tablet    Sig: Take 1 tablet (25 mg total) by mouth 2 (two) times daily.    Dispense:  180 tablet    Refill:  3   pravastatin  (PRAVACHOL ) 20 MG tablet    Sig: Take 1 tablet (20 mg total) by mouth daily.    Dispense:  90 tablet    Refill:  3     Follow up plan: Return in about 6 months (around 12/09/2024) for 6  month PreDM A1c, Oncology updates.  Marsa Officer, DO [facility] 06/11/2024, 8:13 AM

## 2024-06-12 ENCOUNTER — Other Ambulatory Visit: Payer: Self-pay

## 2024-06-13 ENCOUNTER — Other Ambulatory Visit: Payer: Self-pay | Admitting: *Deleted

## 2024-06-13 DIAGNOSIS — C9200 Acute myeloblastic leukemia, not having achieved remission: Secondary | ICD-10-CM

## 2024-06-16 ENCOUNTER — Encounter: Payer: Self-pay | Admitting: Oncology

## 2024-06-16 ENCOUNTER — Inpatient Hospital Stay

## 2024-06-16 ENCOUNTER — Inpatient Hospital Stay (HOSPITAL_BASED_OUTPATIENT_CLINIC_OR_DEPARTMENT_OTHER): Admitting: Oncology

## 2024-06-16 VITALS — BP 127/66 | HR 76 | Temp 98.6°F | Resp 17 | Wt 113.8 lb

## 2024-06-16 DIAGNOSIS — M9901 Segmental and somatic dysfunction of cervical region: Secondary | ICD-10-CM | POA: Diagnosis not present

## 2024-06-16 DIAGNOSIS — Z5111 Encounter for antineoplastic chemotherapy: Secondary | ICD-10-CM | POA: Diagnosis not present

## 2024-06-16 DIAGNOSIS — Q763 Congenital scoliosis due to congenital bony malformation: Secondary | ICD-10-CM | POA: Diagnosis not present

## 2024-06-16 DIAGNOSIS — Z923 Personal history of irradiation: Secondary | ICD-10-CM | POA: Diagnosis not present

## 2024-06-16 DIAGNOSIS — M9903 Segmental and somatic dysfunction of lumbar region: Secondary | ICD-10-CM | POA: Diagnosis not present

## 2024-06-16 DIAGNOSIS — C9200 Acute myeloblastic leukemia, not having achieved remission: Secondary | ICD-10-CM

## 2024-06-16 DIAGNOSIS — M4134 Thoracogenic scoliosis, thoracic region: Secondary | ICD-10-CM | POA: Diagnosis not present

## 2024-06-16 DIAGNOSIS — Z803 Family history of malignant neoplasm of breast: Secondary | ICD-10-CM | POA: Diagnosis not present

## 2024-06-16 DIAGNOSIS — Z86000 Personal history of in-situ neoplasm of breast: Secondary | ICD-10-CM | POA: Diagnosis not present

## 2024-06-16 DIAGNOSIS — M9902 Segmental and somatic dysfunction of thoracic region: Secondary | ICD-10-CM | POA: Diagnosis not present

## 2024-06-16 DIAGNOSIS — Z79899 Other long term (current) drug therapy: Secondary | ICD-10-CM | POA: Diagnosis not present

## 2024-06-16 LAB — CBC WITH DIFFERENTIAL/PLATELET
Abs Immature Granulocytes: 0.02 K/uL (ref 0.00–0.07)
Basophils Absolute: 0 K/uL (ref 0.0–0.1)
Basophils Relative: 0 %
Eosinophils Absolute: 0 K/uL (ref 0.0–0.5)
Eosinophils Relative: 0 %
HCT: 28.2 % — ABNORMAL LOW (ref 36.0–46.0)
Hemoglobin: 9.3 g/dL — ABNORMAL LOW (ref 12.0–15.0)
Immature Granulocytes: 1 %
Lymphocytes Relative: 22 %
Lymphs Abs: 0.3 K/uL — ABNORMAL LOW (ref 0.7–4.0)
MCH: 29.9 pg (ref 26.0–34.0)
MCHC: 33 g/dL (ref 30.0–36.0)
MCV: 90.7 fL (ref 80.0–100.0)
Monocytes Absolute: 0.1 K/uL (ref 0.1–1.0)
Monocytes Relative: 4 %
Neutro Abs: 1.1 K/uL — ABNORMAL LOW (ref 1.7–7.7)
Neutrophils Relative %: 73 %
Platelets: 44 K/uL — ABNORMAL LOW (ref 150–400)
RBC: 3.11 MIL/uL — ABNORMAL LOW (ref 3.87–5.11)
RDW: 17.9 % — ABNORMAL HIGH (ref 11.5–15.5)
WBC: 1.5 K/uL — ABNORMAL LOW (ref 4.0–10.5)
nRBC: 12.4 % — ABNORMAL HIGH (ref 0.0–0.2)

## 2024-06-16 LAB — SAMPLE TO BLOOD BANK

## 2024-06-16 NOTE — Progress Notes (Unsigned)
 [facility]  [phone] [phone]) [phone]  ID: Lauraine DELENA Halsted OB: 09-01-1941  MR#: [id number]  RDW#:[id number]  Patient Care Team: Edman Marsa PARAS, DO as PCP - General (Family Medicine) Damian, Therisa HERO, MD as Physician Assistant (Endocrinology)  CHIEF COMPLAINT: AML   INTERVAL HISTORY: Patient returns to clinic today for repeat laboratory, further evaluation, consideration of blood transfusion.  She continues to feel well and remains asymptomatic.  She is tolerating her treatments without significant side effects.  She has no neurologic complaints.  She denies any recent fevers or illnesses.  She has a good appetite and denies weight loss.  She has no chest pain, shortness of breath, cough, or hemoptysis.  She denies any nausea, vomiting, constipation, or diarrhea.  She has no urinary complaints.  Patient offers no specific complaints today.  REVIEW OF SYSTEMS:   Review of Systems  Constitutional: Negative.  Negative for fever, malaise/fatigue and weight loss.  Respiratory: Negative.  Negative for cough, hemoptysis and shortness of breath.   Cardiovascular: Negative.  Negative for chest pain and leg swelling.  Gastrointestinal: Negative.  Negative for abdominal pain.  Genitourinary: Negative.  Negative for dysuria.  Musculoskeletal: Negative.  Negative for back pain.  Skin: Negative.  Negative for rash.  Neurological: Negative.  Negative for dizziness, focal weakness, weakness and headaches.  Psychiatric/Behavioral: Negative.  The patient is not nervous/anxious.     As per HPI. Otherwise, a complete review of systems is negative.  PAST MEDICAL HISTORY: Past Medical History:  Diagnosis Date   Anemia    Breast cancer (HCC) 09/2014   left breast lumpectomy with rad tx   Hyperlipidemia    Malignant neoplasm of lower-inner quadrant of left female breast (HCC) 07/2014   5 mm,T1a, N0; ER/ PR positive, HER-2/neu negative   Osteoporosis    Personal  history of radiation therapy 2016   left breast ca    PAST SURGICAL HISTORY: Past Surgical History:  Procedure Laterality Date   BREAST BIOPSY Left 08/2014   invasive mammary carcinoma   BREAST LUMPECTOMY Left 10/01/2014   invasive mammary carcinoma and DCIS with clear margins   BREAST SURGERY Left 10/01/2014   lumpectomy   COLONOSCOPY  2010   COLONOSCOPY WITH PROPOFOL  N/A 09/21/2015   Procedure: COLONOSCOPY WITH PROPOFOL ;  Surgeon: Louanne KANDICE Muse, MD;  Location: [facility] ENDOSCOPY;  Service: Endoscopy;  Laterality: N/A;   IR BONE MARROW BIOPSY & ASPIRATION  11/19/2023   UPPER GI ENDOSCOPY  2014    FAMILY HISTORY: Family History  Problem Relation Age of Onset   Breast cancer Sister 81   Heart disease Mother    Heart disease Father     ADVANCED DIRECTIVES (Y/N):  N  HEALTH MAINTENANCE: Social History   Tobacco Use   Smoking status: Never   Smokeless tobacco: Never  Vaping Use   Vaping status: Never Used  Substance Use Topics   Alcohol use: No    Alcohol/week: 0.0 standard drinks of alcohol   Drug use: No     Colonoscopy:  PAP:  Bone density:  Lipid panel:  No Known Allergies  Current Outpatient Medications  Medication Sig Dispense Refill   amLODipine  (NORVASC ) 5 MG tablet TAKE 1 TABLET BY MOUTH DAILY 100 tablet 3   Ascorbic Acid (VITAMIN C) 1000 MG tablet Take 1,000 mg by mouth daily.     calcium carbonate 100 mg/ml SUSP Take by mouth.     cefdinir (OMNICEF) 300 MG capsule Take 300 mg by mouth.  Cholecalciferol (VITAMIN D3) 10 MCG (400 UNIT) CAPS Take by mouth.     co-enzyme Q-10 30 MG capsule Take 30 mg by mouth 3 (three) times daily.     FERROUS SULFATE PO Take 1 tablet by mouth once a week.     fluticasone  (FLONASE ) 50 MCG/ACT nasal spray 1 spray.     hydrochlorothiazide (HYDRODIURIL) 25 MG tablet Take 25 mg by mouth.     Lutein-Zeaxanthin 15-0.7 MG CAPS Take 1 capsule by mouth daily.     metoprolol  tartrate (LOPRESSOR ) 25 MG tablet Take 1 tablet (25  mg total) by mouth 2 (two) times daily. 180 tablet 3   Omega-3 Fatty Acids (FISH OIL PO) Take 1 capsule by mouth daily.     ondansetron  (ZOFRAN ) 4 MG tablet Take 4 mg by mouth.     pravastatin  (PRAVACHOL ) 20 MG tablet Take 1 tablet (20 mg total) by mouth daily. 90 tablet 3   Saccharomyces boulardii (PROBIOTIC) 250 MG CAPS Take 250 mg by mouth daily.     valACYclovir (VALTREX) 500 MG tablet Take 500 mg by mouth daily.     venetoclax (VENCLEXTA) 100 MG tablet Take 1 tablet (100 mg total) by mouth once daily for days 1-14 of a 28 day cycle. Take with a meal and water. Do not chew, crush, or break tablets.     No current facility-administered medications for this visit.    OBJECTIVE: Vitals:   06/16/24 1115  BP: 127/66  Pulse: 76  Resp: 17  Temp: 98.6 F (37 C)  SpO2: 98%     Body mass index is 19.91 kg/m.    ECOG FS:0 - Asymptomatic  General: Well-developed, well-nourished, no acute distress. Eyes: Pink conjunctiva, anicteric sclera. HEENT: Normocephalic, moist mucous membranes. Lungs: No audible wheezing or coughing. Heart: Regular rate and rhythm. Abdomen: Soft, nontender, no obvious distention. Musculoskeletal: No edema, cyanosis, or clubbing. Neuro: Alert, answering all questions appropriately. Cranial nerves grossly intact. Skin: No rashes or petechiae noted. Psych: Normal affect.  LAB RESULTS:  Lab Results  Component Value Date   NA 135 06/02/2024   K 4.3 06/02/2024   CL 101 06/02/2024   CO2 26 06/02/2024   GLUCOSE 122 (H) 06/02/2024   BUN 23 06/02/2024   CREATININE 0.60 06/02/2024   CALCIUM 9.2 06/02/2024   PROT 6.7 11/27/2023   ALBUMIN 3.6 11/27/2023   AST 17 11/27/2023   ALT 14 11/27/2023   ALKPHOS 47 11/27/2023   BILITOT 0.9 11/27/2023   GFRNONAA >60 06/02/2024   GFRAA 98 05/10/2020    Lab Results  Component Value Date   WBC 1.5 (L) 06/16/2024   NEUTROABS 1.1 (L) 06/16/2024   HGB 9.3 (L) 06/16/2024   HCT 28.2 (L) 06/16/2024   MCV 90.7 06/16/2024    PLT 44 (L) 06/16/2024     STUDIES: No results found.  ASSESSMENT: AML.  PLAN:    AML: Patient completed her first 4 cycles of Vidaza  and venetoclax at [facility].  Her most recent bone marrow biopsy on May 14, 2024 reported a hypercellular bone marrow (90%) with marked fibrosis and erythroid predominant hematopoiesis and 4% blasts.  Her original bone marrow biopsy on November 19, 2023 reported 8% blasts.  Patient's next appointment at [facility] is scheduled for June 30, 2024.  Patient does not require transfusion tomorrow.  Return to clinic in 2 weeks for further evaluation and consideration of cycle 6, day 1 of treatment.   Anemia: Hemoglobin improved to 9.3 with transfusion.  She has not required additional  blood tomorrow.  All blood products must be irradiated. Thrombocytopenia: Chronic and unchanged.  Patient's platelet count is 44.  She does not require platelet transfusion at this time. Leukopenia: Patient's total white blood cell count is slightly below her baseline at 1.5.  Monitor.  Return to clinic for treatment in 2 weeks as above.   Patient expressed understanding and was in agreement with this plan. She also understands that She can call clinic at any time with any questions, concerns, or complaints.    Cancer Staging  AML (acute myelogenous leukemia) (HCC) Staging form: Acute Myeloid Leukemia, AJCC 8th Edition - Clinical stage from 05/29/2024: Zubrod performance status: 0, Cytogenetics (20 metaphase): Unknown, NPM1-, CEBPA-, FLT3- - Signed by Jacobo Evalene PARAS, MD on 05/29/2024 Stage prefix: Initial diagnosis   Evalene PARAS Jacobo, MD   06/17/2024 9:37 AM

## 2024-06-17 ENCOUNTER — Encounter: Payer: Self-pay | Admitting: Oncology

## 2024-06-17 ENCOUNTER — Telehealth: Payer: Self-pay | Admitting: Oncology

## 2024-06-17 ENCOUNTER — Other Ambulatory Visit: Payer: Self-pay | Admitting: Oncology

## 2024-06-17 ENCOUNTER — Inpatient Hospital Stay

## 2024-06-17 ENCOUNTER — Telehealth: Payer: Self-pay | Admitting: *Deleted

## 2024-06-17 NOTE — Telephone Encounter (Signed)
 Pt calling requesting a lab appt for next week to see if her hemoglobin and platelets are ok. Can I schedule her a lab appt for next week?

## 2024-06-17 NOTE — Telephone Encounter (Signed)
 Patient called. She stated that her hgb was good and no infusion today. She is concerned that she doesn't have another appointment at [facility] until 10/13, which means going two weeks without any lab checks. Her next apt here is on 10/14. She stated, that NP Alyce Shaggy had sent request to have her labs checked twice a week initially, but pt is requesting labs and possible blood once weekly. She stated that her hgb two weeks ago was 6.7. Rebecca Mitchell expressed that she is concerned that if she waits a few weeks, the counts may drop again. She stated that she tried calling yesterday to inform scheduling of her request. She stated that she is free any time next week, except Tuesday, October 7'th.

## 2024-06-18 ENCOUNTER — Other Ambulatory Visit: Payer: Self-pay | Admitting: Oncology

## 2024-06-19 DIAGNOSIS — M25473 Effusion, unspecified ankle: Secondary | ICD-10-CM | POA: Diagnosis not present

## 2024-06-19 DIAGNOSIS — J45909 Unspecified asthma, uncomplicated: Secondary | ICD-10-CM | POA: Diagnosis not present

## 2024-06-19 DIAGNOSIS — I2089 Other forms of angina pectoris: Secondary | ICD-10-CM | POA: Diagnosis not present

## 2024-06-19 DIAGNOSIS — D649 Anemia, unspecified: Secondary | ICD-10-CM | POA: Diagnosis not present

## 2024-06-19 DIAGNOSIS — E785 Hyperlipidemia, unspecified: Secondary | ICD-10-CM | POA: Diagnosis not present

## 2024-06-19 DIAGNOSIS — C9590 Leukemia, unspecified not having achieved remission: Secondary | ICD-10-CM | POA: Diagnosis not present

## 2024-06-19 DIAGNOSIS — I10 Essential (primary) hypertension: Secondary | ICD-10-CM | POA: Diagnosis not present

## 2024-06-19 DIAGNOSIS — R002 Palpitations: Secondary | ICD-10-CM | POA: Diagnosis not present

## 2024-06-19 DIAGNOSIS — R55 Syncope and collapse: Secondary | ICD-10-CM | POA: Diagnosis not present

## 2024-06-19 DIAGNOSIS — I2510 Atherosclerotic heart disease of native coronary artery without angina pectoris: Secondary | ICD-10-CM | POA: Diagnosis not present

## 2024-06-20 ENCOUNTER — Other Ambulatory Visit: Payer: Self-pay | Admitting: *Deleted

## 2024-06-20 DIAGNOSIS — C9200 Acute myeloblastic leukemia, not having achieved remission: Secondary | ICD-10-CM

## 2024-06-23 ENCOUNTER — Inpatient Hospital Stay: Attending: Oncology

## 2024-06-23 ENCOUNTER — Encounter: Payer: Self-pay | Admitting: *Deleted

## 2024-06-23 ENCOUNTER — Telehealth: Payer: Self-pay | Admitting: Oncology

## 2024-06-23 DIAGNOSIS — C9200 Acute myeloblastic leukemia, not having achieved remission: Secondary | ICD-10-CM | POA: Insufficient documentation

## 2024-06-23 DIAGNOSIS — Z803 Family history of malignant neoplasm of breast: Secondary | ICD-10-CM | POA: Diagnosis not present

## 2024-06-23 DIAGNOSIS — Z79624 Long term (current) use of inhibitors of nucleotide synthesis: Secondary | ICD-10-CM | POA: Insufficient documentation

## 2024-06-23 DIAGNOSIS — Z86000 Personal history of in-situ neoplasm of breast: Secondary | ICD-10-CM | POA: Diagnosis not present

## 2024-06-23 DIAGNOSIS — M810 Age-related osteoporosis without current pathological fracture: Secondary | ICD-10-CM | POA: Diagnosis not present

## 2024-06-23 DIAGNOSIS — Z7969 Long term (current) use of other immunomodulators and immunosuppressants: Secondary | ICD-10-CM | POA: Insufficient documentation

## 2024-06-23 DIAGNOSIS — Z923 Personal history of irradiation: Secondary | ICD-10-CM | POA: Diagnosis not present

## 2024-06-23 DIAGNOSIS — Z79899 Other long term (current) drug therapy: Secondary | ICD-10-CM | POA: Diagnosis not present

## 2024-06-23 DIAGNOSIS — Z5111 Encounter for antineoplastic chemotherapy: Secondary | ICD-10-CM | POA: Insufficient documentation

## 2024-06-23 LAB — CBC WITH DIFFERENTIAL/PLATELET
Abs Immature Granulocytes: 0.02 K/uL (ref 0.00–0.07)
Basophils Absolute: 0 K/uL (ref 0.0–0.1)
Basophils Relative: 0 %
Eosinophils Absolute: 0 K/uL (ref 0.0–0.5)
Eosinophils Relative: 0 %
HCT: 24.2 % — ABNORMAL LOW (ref 36.0–46.0)
Hemoglobin: 8 g/dL — ABNORMAL LOW (ref 12.0–15.0)
Immature Granulocytes: 4 %
Lymphocytes Relative: 26 %
Lymphs Abs: 0.1 K/uL — ABNORMAL LOW (ref 0.7–4.0)
MCH: 30.1 pg (ref 26.0–34.0)
MCHC: 33.1 g/dL (ref 30.0–36.0)
MCV: 91 fL (ref 80.0–100.0)
Monocytes Absolute: 0.1 K/uL (ref 0.1–1.0)
Monocytes Relative: 26 %
Neutro Abs: 0.2 K/uL — CL (ref 1.7–7.7)
Neutrophils Relative %: 44 %
Platelets: 60 K/uL — ABNORMAL LOW (ref 150–400)
RBC: 2.66 MIL/uL — ABNORMAL LOW (ref 3.87–5.11)
RDW: 19.5 % — ABNORMAL HIGH (ref 11.5–15.5)
WBC: 0.5 K/uL — CL (ref 4.0–10.5)
nRBC: 13.2 % — ABNORMAL HIGH (ref 0.0–0.2)

## 2024-06-23 LAB — SAMPLE TO BLOOD BANK

## 2024-06-23 NOTE — Telephone Encounter (Signed)
 Pt called, stated she missed a phone call.    I told pt that there is a mychart msg with more information from the RN that called her. And confirmed that the blood appt wed. Has been canceled.

## 2024-06-25 ENCOUNTER — Inpatient Hospital Stay

## 2024-06-30 ENCOUNTER — Other Ambulatory Visit: Payer: Self-pay | Admitting: *Deleted

## 2024-06-30 DIAGNOSIS — C9200 Acute myeloblastic leukemia, not having achieved remission: Secondary | ICD-10-CM

## 2024-07-01 ENCOUNTER — Inpatient Hospital Stay

## 2024-07-01 ENCOUNTER — Encounter: Payer: Self-pay | Admitting: Oncology

## 2024-07-01 ENCOUNTER — Inpatient Hospital Stay (HOSPITAL_BASED_OUTPATIENT_CLINIC_OR_DEPARTMENT_OTHER): Admitting: Oncology

## 2024-07-01 VITALS — BP 111/54 | HR 79

## 2024-07-01 VITALS — BP 122/60 | HR 85 | Temp 97.0°F | Resp 18 | Ht 63.39 in | Wt 114.0 lb

## 2024-07-01 DIAGNOSIS — M810 Age-related osteoporosis without current pathological fracture: Secondary | ICD-10-CM | POA: Diagnosis not present

## 2024-07-01 DIAGNOSIS — C9200 Acute myeloblastic leukemia, not having achieved remission: Secondary | ICD-10-CM

## 2024-07-01 MED ORDER — ONDANSETRON HCL 8 MG PO TABS
8.0000 mg | ORAL_TABLET | Freq: Once | ORAL | Status: AC
  Administered 2024-07-01: 8 mg via ORAL
  Filled 2024-07-01: qty 1

## 2024-07-01 MED ORDER — AZACITIDINE CHEMO SQ INJECTION
75.0000 mg/m2 | Freq: Once | INTRAMUSCULAR | Status: AC
  Administered 2024-07-01: 115 mg via SUBCUTANEOUS
  Filled 2024-07-01: qty 4.6

## 2024-07-01 NOTE — Progress Notes (Signed)
 Patient had 2 things of blood yesterday at [facility].

## 2024-07-01 NOTE — Progress Notes (Signed)
 [facility]  [phone] [phone]  ID: Rebecca Mitchell OB: [birth year]  MR#: [id number]  RDW#:[id number]  Patient Care Team: Edman Marsa PARAS, DO as PCP - General (Family Medicine) Damian, Therisa HERO, MD as Physician Assistant (Endocrinology)  CHIEF COMPLAINT: AML   INTERVAL HISTORY: Patient returns to clinic today for repeat laboratory work, further evaluation, and consideration of cycle 6, day 1 of Vidaza  and venetoclax.  She was seen at [facility] yesterday and received 2 units packed red blood cells.  Recommendation was also to increase her Vidaza  to 7 days rather than 5.  She currently feels well and is asymptomatic.  She does not complain of weakness or fatigue today.  She continues to tolerate her treatments well without significant side effects.  She has no neurologic complaints.  She denies any recent fevers or illnesses.  She has a good appetite and denies weight loss.  She has no chest pain, shortness of breath, cough, or hemoptysis.  She denies any nausea, vomiting, constipation, or diarrhea.  She has no urinary complaints.  Patient offers no specific complaints today.  REVIEW OF SYSTEMS:   Review of Systems  Constitutional: Negative.  Negative for fever, malaise/fatigue and weight loss.  Respiratory: Negative.  Negative for cough, hemoptysis and shortness of breath.   Cardiovascular: Negative.  Negative for chest pain and leg swelling.  Gastrointestinal: Negative.  Negative for abdominal pain.  Genitourinary: Negative.  Negative for dysuria.  Musculoskeletal: Negative.  Negative for back pain.  Skin: Negative.  Negative for rash.  Neurological: Negative.  Negative for dizziness, focal weakness, weakness and headaches.  Psychiatric/Behavioral: Negative.  The patient is not nervous/anxious.     As per HPI. Otherwise, a complete review of systems is negative.  PAST MEDICAL HISTORY: Past Medical History:  Diagnosis Date   Anemia    Breast  cancer (HCC) 09/2014   left breast lumpectomy with rad tx   Hyperlipidemia    Malignant neoplasm of lower-inner quadrant of left female breast (HCC) 07/2014   5 mm,T1a, N0; ER/ PR positive, HER-2/neu negative   Osteoporosis    Personal history of radiation therapy 2016   left breast ca    PAST SURGICAL HISTORY: Past Surgical History:  Procedure Laterality Date   BREAST BIOPSY Left 08/2014   invasive mammary carcinoma   BREAST LUMPECTOMY Left 10/01/2014   invasive mammary carcinoma and DCIS with clear margins   BREAST SURGERY Left 10/01/2014   lumpectomy   COLONOSCOPY  2010   COLONOSCOPY WITH PROPOFOL  N/A 09/21/2015   Procedure: COLONOSCOPY WITH PROPOFOL ;  Surgeon: Louanne KANDICE Muse, MD;  Location: [facility] ENDOSCOPY;  Service: Endoscopy;  Laterality: N/A;   IR BONE MARROW BIOPSY & ASPIRATION  11/19/2023   UPPER GI ENDOSCOPY  2014    FAMILY HISTORY: Family History  Problem Relation Age of Onset   Breast cancer Sister 40   Heart disease Mother    Heart disease Father     ADVANCED DIRECTIVES (Y/N):  N  HEALTH MAINTENANCE: Social History   Tobacco Use   Smoking status: Never   Smokeless tobacco: Never  Vaping Use   Vaping status: Never Used  Substance Use Topics   Alcohol use: No    Alcohol/week: 0.0 standard drinks of alcohol   Drug use: No     Colonoscopy:  PAP:  Bone density:  Lipid panel:  No Known Allergies  Current Outpatient Medications  Medication Sig Dispense Refill   amLODipine  (NORVASC ) 5 MG tablet TAKE 1 TABLET BY  MOUTH DAILY 100 tablet 3   Ascorbic Acid (VITAMIN C) 1000 MG tablet Take 1,000 mg by mouth daily.     calcium carbonate 100 mg/ml SUSP Take by mouth.     cefdinir (OMNICEF) 300 MG capsule Take 300 mg by mouth.     Cholecalciferol (VITAMIN D3) 10 MCG (400 UNIT) CAPS Take by mouth.     co-enzyme Q-10 30 MG capsule Take 30 mg by mouth 3 (three) times daily.     FERROUS SULFATE PO Take 1 tablet by mouth once a week.     fluticasone  (FLONASE )  50 MCG/ACT nasal spray 1 spray.     hydrochlorothiazide (HYDRODIURIL) 25 MG tablet Take 25 mg by mouth.     Lutein-Zeaxanthin 15-0.7 MG CAPS Take 1 capsule by mouth daily.     metoprolol  tartrate (LOPRESSOR ) 25 MG tablet Take 1 tablet (25 mg total) by mouth 2 (two) times daily. 180 tablet 3   Omega-3 Fatty Acids (FISH OIL PO) Take 1 capsule by mouth daily.     ondansetron  (ZOFRAN ) 4 MG tablet Take 4 mg by mouth.     pravastatin  (PRAVACHOL ) 20 MG tablet Take 1 tablet (20 mg total) by mouth daily. 90 tablet 3   Saccharomyces boulardii (PROBIOTIC) 250 MG CAPS Take 250 mg by mouth daily.     valACYclovir (VALTREX) 500 MG tablet Take 500 mg by mouth daily.     venetoclax (VENCLEXTA) 100 MG tablet Take 1 tablet (100 mg total) by mouth once daily for days 1-14 of a 28 day cycle. Take with a meal and water. Do not chew, crush, or break tablets.     No current facility-administered medications for this visit.    OBJECTIVE: Vitals:   07/01/24 0930  BP: 122/60  Pulse: 85  Resp: 18  Temp: (!) 97 F (36.1 C)  SpO2: 100%     Body mass index is 19.95 kg/m.    ECOG FS:0 - Asymptomatic  General: Well-developed, well-nourished, no acute distress. Eyes: Pink conjunctiva, anicteric sclera. HEENT: Normocephalic, moist mucous membranes. Lungs: No audible wheezing or coughing. Heart: Regular rate and rhythm. Abdomen: Soft, nontender, no obvious distention. Musculoskeletal: No edema, cyanosis, or clubbing. Neuro: Alert, answering all questions appropriately. Cranial nerves grossly intact. Skin: No rashes or petechiae noted. Psych: Normal affect.  LAB RESULTS:  Lab Results  Component Value Date   NA 135 06/02/2024   K 4.3 06/02/2024   CL 101 06/02/2024   CO2 26 06/02/2024   GLUCOSE 122 (H) 06/02/2024   BUN 23 06/02/2024   CREATININE 0.60 06/02/2024   CALCIUM 9.2 06/02/2024   PROT 6.7 11/27/2023   ALBUMIN 3.6 11/27/2023   AST 17 11/27/2023   ALT 14 11/27/2023   ALKPHOS 47 11/27/2023    BILITOT 0.9 11/27/2023   GFRNONAA >60 06/02/2024   GFRAA 98 05/10/2020    Lab Results  Component Value Date   WBC 0.5 (LL) 06/23/2024   NEUTROABS 0.2 (LL) 06/23/2024   HGB 8.0 (L) 06/23/2024   HCT 24.2 (L) 06/23/2024   MCV 91.0 06/23/2024   PLT 60 (L) 06/23/2024     STUDIES: No results found.  ASSESSMENT: AML.  PLAN:    AML: Patient completed her first 4 cycles of Vidaza  and venetoclax at [facility].  Her most recent bone marrow biopsy on May 14, 2024 reported a hypercellular bone marrow (90%) with marked fibrosis and erythroid predominant hematopoiesis and 4% blasts.  Her original bone marrow biopsy on November 19, 2023 reported 8% blasts.  Patient  was seen at [facility] yesterday and recommendation was to increase Vidaza  treatments from 5 to 7 days.  Continue 100 mg venetoclax on days 1 through 21.  Despite pancytopenia, we will proceed with cycle 6, day 1 of treatment today.  Return to clinic the remainder of the week for continuation of treatment.  Patient will then return to clinic in 1 week for further evaluation and consideration of cycle 6, day 6 of treatment.  Plan is to do a bone marrow biopsy at [facility] in approximately 2 months. Anemia: Patient's hemoglobin was 6.7 yesterday at [facility] and she received 2 units of packed red blood cells. All blood products must be irradiated.  Patient was transfusion threshold is less than 8.0. Thrombocytopenia: Chronic and unchanged.  Patient's most recent platelet count is 41.  She does not require platelet transfusion at this time.  Patient's transfusion threshold is less than 10. Neutropenia: Patient's most recent ANC was 0.1.  This is below her baseline, but will proceed with treatment as above.   Prophylaxis: Continue Valtrex, cefdinir, and posaconazole as prescribed.  Patient expressed understanding and was in agreement with this plan. She also understands that She can call clinic at any time with any questions, concerns, or complaints.    Cancer Staging   AML (acute myelogenous leukemia) (HCC) Staging form: Acute Myeloid Leukemia, AJCC 8th Edition - Clinical stage from 05/29/2024: Zubrod performance status: 0, Cytogenetics (20 metaphase): Unknown, NPM1-, CEBPA-, FLT3- - Signed by Jacobo Evalene PARAS, MD on 05/29/2024 Stage prefix: Initial diagnosis   Evalene PARAS Jacobo, MD   07/01/2024 12:49 PM

## 2024-07-02 ENCOUNTER — Encounter: Payer: Self-pay | Admitting: Oncology

## 2024-07-02 ENCOUNTER — Inpatient Hospital Stay

## 2024-07-02 VITALS — BP 107/54 | HR 81 | Temp 97.6°F | Resp 18

## 2024-07-02 DIAGNOSIS — M810 Age-related osteoporosis without current pathological fracture: Secondary | ICD-10-CM | POA: Diagnosis not present

## 2024-07-02 DIAGNOSIS — C9200 Acute myeloblastic leukemia, not having achieved remission: Secondary | ICD-10-CM

## 2024-07-02 MED ORDER — ONDANSETRON HCL 8 MG PO TABS
8.0000 mg | ORAL_TABLET | Freq: Once | ORAL | Status: AC
  Administered 2024-07-02: 8 mg via ORAL
  Filled 2024-07-02: qty 1

## 2024-07-02 MED ORDER — AZACITIDINE CHEMO SQ INJECTION
75.0000 mg/m2 | Freq: Once | INTRAMUSCULAR | Status: AC
  Administered 2024-07-02: 115 mg via SUBCUTANEOUS
  Filled 2024-07-02: qty 4.6

## 2024-07-03 ENCOUNTER — Inpatient Hospital Stay

## 2024-07-03 VITALS — BP 108/55 | HR 76 | Temp 96.0°F | Resp 18

## 2024-07-03 DIAGNOSIS — M810 Age-related osteoporosis without current pathological fracture: Secondary | ICD-10-CM | POA: Diagnosis not present

## 2024-07-03 DIAGNOSIS — C9200 Acute myeloblastic leukemia, not having achieved remission: Secondary | ICD-10-CM

## 2024-07-03 MED ORDER — AZACITIDINE CHEMO SQ INJECTION
75.0000 mg/m2 | Freq: Once | INTRAMUSCULAR | Status: AC
  Administered 2024-07-03: 115 mg via SUBCUTANEOUS
  Filled 2024-07-03: qty 4.6

## 2024-07-03 MED ORDER — ONDANSETRON HCL 8 MG PO TABS
8.0000 mg | ORAL_TABLET | Freq: Once | ORAL | Status: AC
  Administered 2024-07-03: 8 mg via ORAL
  Filled 2024-07-03: qty 1

## 2024-07-03 NOTE — Patient Instructions (Signed)
 [facility] - A DEPT OF . [facility]  Discharge Instructions: Thank you for choosing [facility] to provide your oncology and hematology care.  If you have a lab appointment with the Cancer Center, please go directly to the Cancer Center and check in at the registration area.  Wear comfortable clothing and clothing appropriate for easy access to any Portacath or PICC line.   We strive to give you quality time with your provider. You may need to reschedule your appointment if you arrive late (15 or more minutes).  Arriving late affects you and other patients whose appointments are after yours.  Also, if you miss three or more appointments without notifying the office, you may be dismissed from the clinic at the provider's discretion.      For prescription refill requests, have your pharmacy contact our office and allow 72 hours for refills to be completed.    Today you received the following chemotherapy and/or immunotherapy agents VIDAZA        To help prevent nausea and vomiting after your treatment, we encourage you to take your nausea medication as directed.  BELOW ARE SYMPTOMS THAT SHOULD BE REPORTED IMMEDIATELY: *FEVER GREATER THAN 100.4 F (38 C) OR HIGHER *CHILLS OR SWEATING *NAUSEA AND VOMITING THAT IS NOT CONTROLLED WITH YOUR NAUSEA MEDICATION *UNUSUAL SHORTNESS OF BREATH *UNUSUAL BRUISING OR BLEEDING *URINARY PROBLEMS (pain or burning when urinating, or frequent urination) *BOWEL PROBLEMS (unusual diarrhea, constipation, pain near the anus) TENDERNESS IN MOUTH AND THROAT WITH OR WITHOUT PRESENCE OF ULCERS (sore throat, sores in mouth, or a toothache) UNUSUAL RASH, SWELLING OR PAIN  UNUSUAL VAGINAL DISCHARGE OR ITCHING   Items with * indicate a potential emergency and should be followed up as soon as possible or go to the Emergency Department if any problems should occur.  Please show the CHEMOTHERAPY ALERT CARD or IMMUNOTHERAPY  ALERT CARD at check-in to the Emergency Department and triage nurse.  Should you have questions after your visit or need to cancel or reschedule your appointment, please contact [facility] - A DEPT OF [facility]  [phone] and follow the prompts.  Office hours are 8:00 a.m. to 4:30 p.m. Monday - Friday. Please note that voicemails left after 4:00 p.m. may not be returned until the following business day.  We are closed weekends and major holidays. You have access to a nurse at all times for urgent questions. Please call the main number to the clinic [phone] and follow the prompts.  For any non-urgent questions, you may also contact your provider using MyChart. We now offer e-Visits for anyone 77 and older to request care online for non-urgent symptoms. For details visit [email or web address].   Also download the MyChart app! Go to the app store, search MyChart, open the app, select [facility], and log in with your MyChart username and password.  Azacitidine  Injection What is this medication? AZACITIDINE  (ay [employer] i deen) treats blood and bone marrow cancers. It works by slowing down the growth of cancer cells. This medicine may be used for other purposes; ask your health care provider or pharmacist if you have questions. COMMON BRAND NAME(S): Vidaza  What should I tell my care team before I take this medication? They need to know if you have any of these conditions: Kidney disease Liver disease Low blood cell levels, such as low white cells, platelets, or red blood cells Low levels of albumin in  the blood Low levels of bicarbonate in the blood An unusual or allergic reaction to azacitidine , mannitol, other medications, foods, dyes, or preservatives If you or your partner are pregnant or trying to get pregnant Breast-feeding How should I use this medication? This medication is injected into a vein or under the skin. It is given by your care  team in a hospital or clinic setting. Talk to your care team about the use of this medication in children. While it may be prescribed for children as young as 1 month for selected conditions, precautions do apply. Overdosage: If you think you have taken too much of this medicine contact a poison control center or emergency room at once. NOTE: This medicine is only for you. Do not share this medicine with others. What if I miss a dose? Keep appointments for follow-up doses. It is important not to miss your dose. Call your care team if you are unable to keep an appointment. What may interact with this medication? Interactions are not expected. This list may not describe all possible interactions. Give your health care provider a list of all the medicines, herbs, non-prescription drugs, or dietary supplements you use. Also tell them if you smoke, drink alcohol, or use illegal drugs. Some items may interact with your medicine. What should I watch for while using this medication? Your condition will be monitored carefully while you are receiving this medication. This medication may make you feel generally unwell. This is not uncommon as chemotherapy can affect healthy cells as well as cancer cells. Report any side effects. Continue your course of treatment even though you feel ill unless your care team tells you to stop. You may need blood work done while you are taking this medication. Other product types may be available that contain the medication azacitidine . The injection and oral products should not be used in place of one another. Talk to your care team if you have questions. This medication can cause serious side effects. To reduce the risk, your care team may give you other medications to take before receiving this one. Be sure to follow the directions from your care team. This medication may increase your risk of getting an infection. Call your care team for advice if you get a fever, chills, sore  throat, or other symptoms of a cold or flu. Do not treat yourself. Try to avoid being around people who are sick. Avoid taking medications that contain aspirin, acetaminophen , ibuprofen , naproxen, or ketoprofen unless instructed by your care team. These medications may hide a fever. Be careful brushing or flossing your teeth or using a toothpick because you may get an infection or bleed more easily. If you have any dental work done, tell your dentist you are receiving this medication. Talk to your care team if you or your partner may be pregnant. Serious birth defects can occur if you take this medication during pregnancy and for 6 months after the last dose. You will need a negative pregnancy test before starting this medication. Contraception is recommended while taking this medication and for 6 months after the last dose. Your care team can help you find the option that works for you. If your partner can get pregnant, use a condom during sex while taking this medication and for 3 months after the last dose. Do not breastfeed while taking this medication and for 1 week after the last dose. This medication may cause infertility. Talk to your care team if you are concerned about your fertility.  What side effects may I notice from receiving this medication? Side effects that you should report to your care team as soon as possible: Allergic reactions--skin rash, itching, hives, swelling of the face, lips, tongue, or throat Infection--fever, chills, cough, sore throat, wounds that don't heal, pain or trouble when passing urine, general feeling of discomfort or being unwell Kidney injury--decrease in the amount of urine, swelling of the ankles, hands, or feet Liver injury--right upper belly pain, loss of appetite, nausea, light-colored stool, dark yellow or brown urine, yellowing skin or eyes, unusual weakness or fatigue Low red blood cell level--unusual weakness or fatigue, dizziness, headache, trouble  breathing Tumor lysis syndrome (TLS)--nausea, vomiting, diarrhea, decrease in the amount of urine, dark urine, unusual weakness or fatigue, confusion, muscle pain or cramps, fast or irregular heartbeat, joint pain Unusual bruising or bleeding Side effects that usually do not require medical attention (report to your care team if they continue or are bothersome): Constipation Diarrhea Nausea Pain, redness, or irritation at injection site Vomiting This list may not describe all possible side effects. Call your doctor for medical advice about side effects. You may report side effects to FDA at 1-800-FDA-1088. Where should I keep my medication? This medication is given in a hospital or clinic. It will not be stored at home. NOTE: This sheet is a summary. It may not cover all possible information. If you have questions about this medicine, talk to your doctor, pharmacist, or health care provider.  2024 Elsevier/Gold Standard (2023-05-07 00:00:00)

## 2024-07-04 ENCOUNTER — Inpatient Hospital Stay

## 2024-07-04 VITALS — BP 126/62 | HR 84 | Temp 98.3°F | Resp 16

## 2024-07-04 DIAGNOSIS — C9200 Acute myeloblastic leukemia, not having achieved remission: Secondary | ICD-10-CM

## 2024-07-04 DIAGNOSIS — M810 Age-related osteoporosis without current pathological fracture: Secondary | ICD-10-CM | POA: Diagnosis not present

## 2024-07-04 MED ORDER — ONDANSETRON HCL 8 MG PO TABS
8.0000 mg | ORAL_TABLET | Freq: Once | ORAL | Status: AC
  Administered 2024-07-04: 8 mg via ORAL
  Filled 2024-07-04: qty 1

## 2024-07-04 MED ORDER — AZACITIDINE CHEMO SQ INJECTION
75.0000 mg/m2 | Freq: Once | INTRAMUSCULAR | Status: AC
  Administered 2024-07-04: 115 mg via SUBCUTANEOUS
  Filled 2024-07-04: qty 4.6

## 2024-07-04 NOTE — Patient Instructions (Signed)
 [facility] - A DEPT OF [facility]  Discharge Instructions: Thank you for choosing [facility] to provide your oncology and hematology care.  If you have a lab appointment with the Cancer Center, please go directly to the Cancer Center and check in at the registration area.  Wear comfortable clothing and clothing appropriate for easy access to any Portacath or PICC line.   We strive to give you quality time with your provider. You may need to reschedule your appointment if you arrive late (15 or more minutes).  Arriving late affects you and other patients whose appointments are after yours.  Also, if you miss three or more appointments without notifying the office, you may be dismissed from the clinic at the provider's discretion.      For prescription refill requests, have your pharmacy contact our office and allow 72 hours for refills to be completed.    Today you received the following chemotherapy and/or immunotherapy agents- vidaza      To help prevent nausea and vomiting after your treatment, we encourage you to take your nausea medication as directed.  BELOW ARE SYMPTOMS THAT SHOULD BE REPORTED IMMEDIATELY: *FEVER GREATER THAN 100.4 F (38 C) OR HIGHER *CHILLS OR SWEATING *NAUSEA AND VOMITING THAT IS NOT CONTROLLED WITH YOUR NAUSEA MEDICATION *UNUSUAL SHORTNESS OF BREATH *UNUSUAL BRUISING OR BLEEDING *URINARY PROBLEMS (pain or burning when urinating, or frequent urination) *BOWEL PROBLEMS (unusual diarrhea, constipation, pain near the anus) TENDERNESS IN MOUTH AND THROAT WITH OR WITHOUT PRESENCE OF ULCERS (sore throat, sores in mouth, or a toothache) UNUSUAL RASH, SWELLING OR PAIN  UNUSUAL VAGINAL DISCHARGE OR ITCHING   Items with * indicate a potential emergency and should be followed up as soon as possible or go to the Emergency Department if any problems should occur.  Please show the CHEMOTHERAPY ALERT CARD or IMMUNOTHERAPY  ALERT CARD at check-in to the Emergency Department and triage nurse.  Should you have questions after your visit or need to cancel or reschedule your appointment, please contact [facility] - A DEPT OF [facility]  [phone] and follow the prompts.  Office hours are 8:00 a.m. to 4:30 p.m. Monday - Friday. Please note that voicemails left after 4:00 p.m. may not be returned until the following business day.  We are closed weekends and major holidays. You have access to a nurse at all times for urgent questions. Please call the main number to the clinic [phone] and follow the prompts.  For any non-urgent questions, you may also contact your provider using MyChart. We now offer e-Visits for anyone 93 and older to request care online for non-urgent symptoms. For details visit [email or web address].   Also download the MyChart app! Go to the app store, search "MyChart", open the app, select [place], and log in with your MyChart username and password.

## 2024-07-07 ENCOUNTER — Other Ambulatory Visit: Payer: Self-pay | Admitting: *Deleted

## 2024-07-07 ENCOUNTER — Other Ambulatory Visit: Payer: Self-pay | Admitting: Oncology

## 2024-07-07 ENCOUNTER — Inpatient Hospital Stay

## 2024-07-07 ENCOUNTER — Inpatient Hospital Stay (HOSPITAL_BASED_OUTPATIENT_CLINIC_OR_DEPARTMENT_OTHER): Admitting: Nurse Practitioner

## 2024-07-07 ENCOUNTER — Encounter: Payer: Self-pay | Admitting: Oncology

## 2024-07-07 DIAGNOSIS — D696 Thrombocytopenia, unspecified: Secondary | ICD-10-CM | POA: Diagnosis not present

## 2024-07-07 DIAGNOSIS — T148XXA Other injury of unspecified body region, initial encounter: Secondary | ICD-10-CM

## 2024-07-07 DIAGNOSIS — C9200 Acute myeloblastic leukemia, not having achieved remission: Secondary | ICD-10-CM

## 2024-07-07 DIAGNOSIS — M810 Age-related osteoporosis without current pathological fracture: Secondary | ICD-10-CM | POA: Diagnosis not present

## 2024-07-07 DIAGNOSIS — D649 Anemia, unspecified: Secondary | ICD-10-CM

## 2024-07-07 LAB — CBC WITH DIFFERENTIAL/PLATELET
Abs Immature Granulocytes: 0.01 K/uL (ref 0.00–0.07)
Basophils Absolute: 0 K/uL (ref 0.0–0.1)
Basophils Relative: 0 %
Eosinophils Absolute: 0 K/uL (ref 0.0–0.5)
Eosinophils Relative: 0 %
HCT: 22.5 % — ABNORMAL LOW (ref 36.0–46.0)
Hemoglobin: 7.8 g/dL — ABNORMAL LOW (ref 12.0–15.0)
Immature Granulocytes: 2 %
Lymphocytes Relative: 44 %
Lymphs Abs: 0.2 K/uL — ABNORMAL LOW (ref 0.7–4.0)
MCH: 29.9 pg (ref 26.0–34.0)
MCHC: 34.7 g/dL (ref 30.0–36.0)
MCV: 86.2 fL (ref 80.0–100.0)
Monocytes Absolute: 0.1 K/uL (ref 0.1–1.0)
Monocytes Relative: 14 %
Neutro Abs: 0.2 K/uL — CL (ref 1.7–7.7)
Neutrophils Relative %: 40 %
Platelets: 9 K/uL — CL (ref 150–400)
RBC: 2.61 MIL/uL — ABNORMAL LOW (ref 3.87–5.11)
RDW: 16.2 % — ABNORMAL HIGH (ref 11.5–15.5)
WBC: 0.5 K/uL — CL (ref 4.0–10.5)
nRBC: 0 % (ref 0.0–0.2)

## 2024-07-07 LAB — CMP (CANCER CENTER ONLY)
ALT: 18 U/L (ref 0–44)
AST: 21 U/L (ref 15–41)
Albumin: 4 g/dL (ref 3.5–5.0)
Alkaline Phosphatase: 62 U/L (ref 38–126)
Anion gap: 8 (ref 5–15)
BUN: 31 mg/dL — ABNORMAL HIGH (ref 8–23)
CO2: 25 mmol/L (ref 22–32)
Calcium: 9.6 mg/dL (ref 8.9–10.3)
Chloride: 98 mmol/L (ref 98–111)
Creatinine: 0.76 mg/dL (ref 0.44–1.00)
GFR, Estimated: >60 mL/min (ref >60)
Glucose, Bld: 121 mg/dL — ABNORMAL HIGH (ref 70–99)
Potassium: 4.3 mmol/L (ref 3.5–5.1)
Sodium: 131 mmol/L — ABNORMAL LOW (ref 135–145)
Total Bilirubin: 1.1 mg/dL (ref 0.0–1.2)
Total Protein: 7.1 g/dL (ref 6.5–8.1)

## 2024-07-07 LAB — PREPARE RBC (CROSSMATCH)

## 2024-07-07 MED ORDER — ACETAMINOPHEN 325 MG PO TABS
650.0000 mg | ORAL_TABLET | Freq: Once | ORAL | Status: AC
  Administered 2024-07-07: 650 mg via ORAL
  Filled 2024-07-07: qty 2

## 2024-07-07 MED ORDER — DIPHENHYDRAMINE HCL 50 MG/ML IJ SOLN
25.0000 mg | Freq: Once | INTRAMUSCULAR | Status: AC
  Administered 2024-07-07: 25 mg via INTRAVENOUS
  Filled 2024-07-07: qty 1

## 2024-07-07 MED ORDER — SODIUM CHLORIDE 0.9% IV SOLUTION
250.0000 mL | INTRAVENOUS | Status: DC
  Administered 2024-07-07: 100 mL via INTRAVENOUS
  Filled 2024-07-07: qty 250

## 2024-07-07 MED FILL — Azacitidine For Inj 100 MG: INTRAMUSCULAR | Qty: 4.6 | Status: AC

## 2024-07-07 NOTE — Progress Notes (Signed)
 Symptom Management Clinic  [facility] at [facility] A Department of the [place] [address] [place], [place] [address] [phone] (phone) [phone] (fax)  Patient Care Team: Edman Marsa PARAS, DO as PCP - General (Family Medicine) Damian Therisa HERO, MD as Physician Assistant (Endocrinology)   Name of the patient: Rebecca Mitchell  [id number]  October 02, 1940   Date of visit: 07/07/24  Diagnosis- AML  Chief complaint/ Reason for visit- Bleeding Sore on abdomen  Heme/Onc history:  Oncology History  AML (acute myelogenous leukemia) (HCC)  05/29/2024 Initial Diagnosis   AML (acute myelogenous leukemia) (HCC)   05/29/2024 Cancer Staging   Staging form: Acute Myeloid Leukemia, AJCC 8th Edition - Clinical stage from 05/29/2024: Zubrod performance status: 0, Cytogenetics (20 metaphase): Unknown, NPM1-, CEBPA-, FLT3- - Signed by Jacobo Evalene PARAS, MD on 05/29/2024 Stage prefix: Initial diagnosis   06/02/2024 -  Chemotherapy   Patient is on Treatment Plan : AML Azacitidine  SQ D1-5 q28d       Interval history-reviewed his recent patient is an 83 year old female with above history of AML, currently receiving Vidaza  and venetoclax who presents to symptom management clinic for complaints of an abdominal wound.  She noticed an growth on her abdomen approximately 3 days ago that has been weeping blood.  She has scattered bruising and palpable nodules on her abdomen.  Review of systems- Review of Systems  Constitutional:  Positive for malaise/fatigue.  HENT:  Negative for nosebleeds.   Gastrointestinal:  Positive for abdominal pain. Negative for blood in stool, heartburn and melena.  Genitourinary:  Negative for hematuria.  Neurological:  Negative for headaches.     No Known Allergies  Past Medical History:  Diagnosis Date   Anemia    Breast cancer (HCC) 09/2014   left breast lumpectomy with rad tx   Hyperlipidemia    Malignant  neoplasm of lower-inner quadrant of left female breast (HCC) 07/2014   5 mm,T1a, N0; ER/ PR positive, HER-2/neu negative   Osteoporosis    Personal history of radiation therapy 2016   left breast ca   Past Surgical History:  Procedure Laterality Date   BREAST BIOPSY Left 08/2014   invasive mammary carcinoma   BREAST LUMPECTOMY Left 10/01/2014   invasive mammary carcinoma and DCIS with clear margins   BREAST SURGERY Left 10/01/2014   lumpectomy   COLONOSCOPY  2010   COLONOSCOPY WITH PROPOFOL  N/A 09/21/2015   Procedure: COLONOSCOPY WITH PROPOFOL ;  Surgeon: Louanne KANDICE Muse, MD;  Location: [facility] ENDOSCOPY;  Service: Endoscopy;  Laterality: N/A;   IR BONE MARROW BIOPSY & ASPIRATION  11/19/2023   UPPER GI ENDOSCOPY  2014   Current Outpatient Medications:    amLODipine  (NORVASC ) 5 MG tablet, TAKE 1 TABLET BY MOUTH DAILY, Disp: 100 tablet, Rfl: 3   Ascorbic Acid (VITAMIN C) 1000 MG tablet, Take 1,000 mg by mouth daily., Disp: , Rfl:    calcium carbonate 100 mg/ml SUSP, Take by mouth., Disp: , Rfl:    cefdinir (OMNICEF) 300 MG capsule, Take 300 mg by mouth., Disp: , Rfl:    Cholecalciferol (VITAMIN D3) 10 MCG (400 UNIT) CAPS, Take by mouth., Disp: , Rfl:    co-enzyme Q-10 30 MG capsule, Take 30 mg by mouth 3 (three) times daily., Disp: , Rfl:    FERROUS SULFATE PO, Take 1 tablet by mouth once a week., Disp: , Rfl:    fluticasone  (FLONASE ) 50 MCG/ACT nasal spray, 1 spray., Disp: , Rfl:  hydrochlorothiazide (HYDRODIURIL) 25 MG tablet, Take 25 mg by mouth., Disp: , Rfl:    Lutein-Zeaxanthin 15-0.7 MG CAPS, Take 1 capsule by mouth daily., Disp: , Rfl:    metoprolol  tartrate (LOPRESSOR ) 25 MG tablet, Take 1 tablet (25 mg total) by mouth 2 (two) times daily., Disp: 180 tablet, Rfl: 3   Omega-3 Fatty Acids (FISH OIL PO), Take 1 capsule by mouth daily., Disp: , Rfl:    ondansetron  (ZOFRAN ) 4 MG tablet, Take 4 mg by mouth., Disp: , Rfl:    pravastatin  (PRAVACHOL ) 20 MG tablet, Take 1 tablet (20 mg  total) by mouth daily., Disp: 90 tablet, Rfl: 3   Saccharomyces boulardii (PROBIOTIC) 250 MG CAPS, Take 250 mg by mouth daily., Disp: , Rfl:    valACYclovir (VALTREX) 500 MG tablet, Take 500 mg by mouth daily., Disp: , Rfl:    venetoclax (VENCLEXTA) 100 MG tablet, Take 1 tablet (100 mg total) by mouth once daily for days 1-14 of a 28 day cycle. Take with a meal and water. Do not chew, crush, or break tablets., Disp: , Rfl:  No current facility-administered medications for this visit.  Facility-Administered Medications Ordered in Other Visits:    0.9 %  sodium chloride  infusion (Manually program via Guardrails IV Fluids), 250 mL, Intravenous, Continuous, Jacobo, Evalene PARAS, MD, Stopped at 07/07/24 1455  Physical exam: There were no vitals filed for this visit. Physical Exam Vitals reviewed.  Constitutional:      Comments: Frail appearing. In infusion suite, recliner.   Pulmonary:     Effort: No respiratory distress.  Abdominal:     Tenderness: There is abdominal tenderness.     Comments: Scatter nodules of abdomen approximately 3-5 cm. Generalized tenderness.   Neurological:     Mental Status: She is alert and oriented to person, place, and time.  Psychiatric:        Mood and Affect: Mood normal.        Behavior: Behavior normal.            Latest Ref Rng & Units 07/07/2024   12:24 PM  CMP  Glucose 70 - 99 mg/dL 878   BUN 8 - 23 mg/dL 31   Creatinine 9.55 - 1.00 mg/dL 9.23   Sodium 864 - 854 mmol/L 131   Potassium 3.5 - 5.1 mmol/L 4.3   Chloride 98 - 111 mmol/L 98   CO2 22 - 32 mmol/L 25   Calcium 8.9 - 10.3 mg/dL 9.6   Total Protein 6.5 - 8.1 g/dL 7.1   Total Bilirubin 0.0 - 1.2 mg/dL 1.1   Alkaline Phos 38 - 126 U/L 62   AST 15 - 41 U/L 21   ALT 0 - 44 U/L 18       Latest Ref Rng & Units 07/07/2024   12:24 PM  CBC  WBC 4.0 - 10.5 K/uL 0.5   Hemoglobin 12.0 - 15.0 g/dL 7.8   Hematocrit 63.9 - 46.0 % 22.5   Platelets 150 - 400 K/uL 9    No results  found.  Assessment and plan- Patient is a 83 y.o. female diagnosed with AML status post cycle 6 of Vidaza  and venetoclax who presents to symptom management clinic for:   Blood Blister-we reviewed that subcutaneous administration can occasionally lead to skin reactions including the formation of blood blisters at the injection site which is likely etiology of her abdominal nodule.  Secondarily her platelets have dropped to 9 which is increased her risk for formation and exacerbated her  bruising.  I advised her not to squeeze or puncture the blister.  She can provide an overlying absorbent dressing.  Monitor for signs of infection. AML-hold Vidaza  today.  Discussed with Dr. Jacobo. Thrombocytopenia-platelets are 9.  She will receive a platelet transfusion today.  She will follow-up with Dr. Jacobo tomorrow for consideration of PRBCs and possibly additional platelets.   Visit Diagnosis 1. Thrombocytopenia   2. Blood blister     Patient expressed understanding and was in agreement with this plan. She also understands that She can call clinic at any time with any questions, concerns, or complaints.   Thank you for allowing me to participate in the care of this very pleasant patient.   Tinnie Dawn, DNP, AGNP-C, AOCNP [facility] at [facility] [phone]

## 2024-07-07 NOTE — Progress Notes (Signed)
 1130: Pt reports to clinic for Vidaza  appointment. What appears to be a blood Blister noted on right lower abdomen, that is oozing blood, pt reports this appeared on Saturday and has been consistently bleeding since Saturday. Bruising noted from left to right upper abdomen with a hardened area noted on upper left abdomen. Pt reports that Hemorrhoids has also been bleeding more than normal.  Dr. Jacobo and Tinnie Dawn NP made aware.  1224: Labs drawn per Dr. Jacobo and Tinnie Dawn NP at chairside.  1300: Per Dr. Jacobo HOLD Vidaza  and give one unit of Platelets. Pt agrees with plan.   Per Jacobo pt to keep appointments for labs and See MD tomorrow and receive blood transfusion. Pt aware of plan and verbalizes understanding.  Tinnie Dawn NP reviewed treatment options for care of blood blister. Pt asked what to take for pain or discomfort, per Dr. Jacobo, Pt educated to take Tylenol  as directed PRN for pain and to avoid Ibuprofen pt verbalizes understanding.   Pt stable at discharge. New AVS printed and reviewed.

## 2024-07-08 ENCOUNTER — Other Ambulatory Visit: Payer: Self-pay | Admitting: Oncology

## 2024-07-08 ENCOUNTER — Inpatient Hospital Stay

## 2024-07-08 ENCOUNTER — Inpatient Hospital Stay (HOSPITAL_BASED_OUTPATIENT_CLINIC_OR_DEPARTMENT_OTHER): Admitting: Oncology

## 2024-07-08 ENCOUNTER — Encounter: Payer: Self-pay | Admitting: Oncology

## 2024-07-08 VITALS — BP 128/76 | HR 94 | Temp 96.6°F | Resp 16 | Wt 114.0 lb

## 2024-07-08 DIAGNOSIS — C9200 Acute myeloblastic leukemia, not having achieved remission: Secondary | ICD-10-CM

## 2024-07-08 DIAGNOSIS — D649 Anemia, unspecified: Secondary | ICD-10-CM

## 2024-07-08 DIAGNOSIS — M810 Age-related osteoporosis without current pathological fracture: Secondary | ICD-10-CM | POA: Diagnosis not present

## 2024-07-08 LAB — CMP (CANCER CENTER ONLY)
ALT: 16 U/L (ref 0–44)
AST: 19 U/L (ref 15–41)
Albumin: 3.9 g/dL (ref 3.5–5.0)
Alkaline Phosphatase: 64 U/L (ref 38–126)
Anion gap: 10 (ref 5–15)
BUN: 26 mg/dL — ABNORMAL HIGH (ref 8–23)
CO2: 25 mmol/L (ref 22–32)
Calcium: 9.5 mg/dL (ref 8.9–10.3)
Chloride: 101 mmol/L (ref 98–111)
Creatinine: 0.67 mg/dL (ref 0.44–1.00)
GFR, Estimated: >60 mL/min (ref >60)
Glucose, Bld: 122 mg/dL — ABNORMAL HIGH (ref 70–99)
Potassium: 3.6 mmol/L (ref 3.5–5.1)
Sodium: 136 mmol/L (ref 135–145)
Total Bilirubin: 1 mg/dL (ref 0.0–1.2)
Total Protein: 6.9 g/dL (ref 6.5–8.1)

## 2024-07-08 LAB — CBC WITH DIFFERENTIAL/PLATELET
Abs Immature Granulocytes: 0.01 K/uL (ref 0.00–0.07)
Basophils Absolute: 0 K/uL (ref 0.0–0.1)
Basophils Relative: 0 %
Eosinophils Absolute: 0 K/uL (ref 0.0–0.5)
Eosinophils Relative: 0 %
HCT: 22.1 % — ABNORMAL LOW (ref 36.0–46.0)
Hemoglobin: 7.5 g/dL — ABNORMAL LOW (ref 12.0–15.0)
Immature Granulocytes: 3 %
Lymphocytes Relative: 42 %
Lymphs Abs: 0.2 K/uL — ABNORMAL LOW (ref 0.7–4.0)
MCH: 29.5 pg (ref 26.0–34.0)
MCHC: 33.9 g/dL (ref 30.0–36.0)
MCV: 87 fL (ref 80.0–100.0)
Monocytes Absolute: 0.1 K/uL (ref 0.1–1.0)
Monocytes Relative: 13 %
Neutro Abs: 0.2 K/uL — CL (ref 1.7–7.7)
Neutrophils Relative %: 42 %
Platelets: 68 K/uL — ABNORMAL LOW (ref 150–400)
RBC: 2.54 MIL/uL — ABNORMAL LOW (ref 3.87–5.11)
RDW: 16.4 % — ABNORMAL HIGH (ref 11.5–15.5)
WBC: 0.4 K/uL — CL (ref 4.0–10.5)
nRBC: 0 % (ref 0.0–0.2)

## 2024-07-08 LAB — PREPARE PLATELET PHERESIS: Unit division: 0

## 2024-07-08 LAB — SAMPLE TO BLOOD BANK

## 2024-07-08 LAB — BPAM PLATELET PHERESIS
Blood Product Expiration Date: 202510222359
ISSUE DATE / TIME: 202510201342
Unit Type and Rh: 5100

## 2024-07-08 MED ORDER — SODIUM CHLORIDE 0.9% IV SOLUTION
250.0000 mL | INTRAVENOUS | Status: DC
  Administered 2024-07-08: 100 mL via INTRAVENOUS
  Filled 2024-07-08: qty 250

## 2024-07-08 MED ORDER — DIPHENHYDRAMINE HCL 50 MG/ML IJ SOLN
25.0000 mg | Freq: Once | INTRAMUSCULAR | Status: AC
  Administered 2024-07-08: 25 mg via INTRAVENOUS
  Filled 2024-07-08: qty 1

## 2024-07-08 MED ORDER — ACETAMINOPHEN 325 MG PO TABS
650.0000 mg | ORAL_TABLET | Freq: Once | ORAL | Status: AC
  Administered 2024-07-08: 650 mg via ORAL
  Filled 2024-07-08: qty 2

## 2024-07-08 NOTE — Progress Notes (Signed)
 Per Dr. Merna patient will only receive 1 unit of PRBC's today. Hold Vidaza  today. She will be getting Vidaza  tomorrow with no platelets needed. Patient aware.

## 2024-07-08 NOTE — Progress Notes (Signed)
 [facility]  Telephone:(336) [phone] Fax:(336) [phone]  ID: Rebecca Mitchell OB: [birth year]  MR#: [id number]  RDW#:[id number]  Patient Care Team: Edman Marsa PARAS, DO as PCP - General (Family Medicine) Damian, Therisa HERO, MD as Physician Assistant (Endocrinology)  CHIEF COMPLAINT: AML   INTERVAL HISTORY: Patient returns to clinic today for repeat laboratory, further evaluation, and consideration of Vidaza , venetoclax, and blood transfusion.  She received a platelet transfusion yesterday with improvement of her platelet count.  She continues to have bruising throughout her abdomen, but her bleeding has resolved. She does not complain of weakness or fatigue today.  She continues to otherwise tolerate her treatments well without significant side effects.  She has no neurologic complaints.  She denies any recent fevers or illnesses.  She has a good appetite and denies weight loss.  She has no chest pain, shortness of breath, cough, or hemoptysis.  She denies any nausea, vomiting, constipation, or diarrhea.  She has no urinary complaints.  Patient offers no further specific complaints today.  REVIEW OF SYSTEMS:   Review of Systems  Constitutional: Negative.  Negative for fever, malaise/fatigue and weight loss.  Respiratory: Negative.  Negative for cough, hemoptysis and shortness of breath.   Cardiovascular: Negative.  Negative for chest pain and leg swelling.  Gastrointestinal: Negative.  Negative for abdominal pain.  Genitourinary: Negative.  Negative for dysuria.  Musculoskeletal: Negative.  Negative for back pain.  Skin: Negative.  Negative for rash.  Neurological: Negative.  Negative for dizziness, focal weakness, weakness and headaches.  Psychiatric/Behavioral: Negative.  The patient is not nervous/anxious.     As per HPI. Otherwise, a complete review of systems is negative.  PAST MEDICAL HISTORY: Past Medical History:  Diagnosis Date   Anemia    Breast cancer  (HCC) 09/2014   left breast lumpectomy with rad tx   Hyperlipidemia    Malignant neoplasm of lower-inner quadrant of left female breast (HCC) 07/2014   5 mm,T1a, N0; ER/ PR positive, HER-2/neu negative   Osteoporosis    Personal history of radiation therapy 2016   left breast ca    PAST SURGICAL HISTORY: Past Surgical History:  Procedure Laterality Date   BREAST BIOPSY Left 08/2014   invasive mammary carcinoma   BREAST LUMPECTOMY Left 10/01/2014   invasive mammary carcinoma and DCIS with clear margins   BREAST SURGERY Left 10/01/2014   lumpectomy   COLONOSCOPY  2010   COLONOSCOPY WITH PROPOFOL  N/A 09/21/2015   Procedure: COLONOSCOPY WITH PROPOFOL ;  Surgeon: Louanne KANDICE Muse, MD;  Location: [facility] ENDOSCOPY;  Service: Endoscopy;  Laterality: N/A;   IR BONE MARROW BIOPSY & ASPIRATION  11/19/2023   UPPER GI ENDOSCOPY  2014    FAMILY HISTORY: Family History  Problem Relation Age of Onset   Breast cancer Sister 85   Heart disease Mother    Heart disease Father     ADVANCED DIRECTIVES (Y/N):  N  HEALTH MAINTENANCE: Social History   Tobacco Use   Smoking status: Never   Smokeless tobacco: Never  Vaping Use   Vaping status: Never Used  Substance Use Topics   Alcohol use: No    Alcohol/week: 0.0 standard drinks of alcohol   Drug use: No     Colonoscopy:  PAP:  Bone density:  Lipid panel:  No Known Allergies  Current Outpatient Medications  Medication Sig Dispense Refill   amLODipine  (NORVASC ) 5 MG tablet TAKE 1 TABLET BY MOUTH DAILY 100 tablet 3   Ascorbic Acid (VITAMIN C) 1000 MG  tablet Take 1,000 mg by mouth daily.     calcium carbonate 100 mg/ml SUSP Take by mouth.     cefdinir (OMNICEF) 300 MG capsule Take 300 mg by mouth.     Cholecalciferol (VITAMIN D3) 10 MCG (400 UNIT) CAPS Take by mouth.     co-enzyme Q-10 30 MG capsule Take 30 mg by mouth 3 (three) times daily.     FERROUS SULFATE PO Take 1 tablet by mouth once a week.     fluticasone  (FLONASE ) 50  MCG/ACT nasal spray 1 spray.     hydrochlorothiazide (HYDRODIURIL) 25 MG tablet Take 25 mg by mouth.     Lutein-Zeaxanthin 15-0.7 MG CAPS Take 1 capsule by mouth daily.     metoprolol  tartrate (LOPRESSOR ) 25 MG tablet Take 1 tablet (25 mg total) by mouth 2 (two) times daily. 180 tablet 3   Omega-3 Fatty Acids (FISH OIL PO) Take 1 capsule by mouth daily.     ondansetron  (ZOFRAN ) 4 MG tablet Take 4 mg by mouth.     pravastatin  (PRAVACHOL ) 20 MG tablet Take 1 tablet (20 mg total) by mouth daily. 90 tablet 3   Saccharomyces boulardii (PROBIOTIC) 250 MG CAPS Take 250 mg by mouth daily.     valACYclovir (VALTREX) 500 MG tablet Take 500 mg by mouth daily.     venetoclax (VENCLEXTA) 100 MG tablet Take 1 tablet (100 mg total) by mouth once daily for days 1-14 of a 28 day cycle. Take with a meal and water. Do not chew, crush, or break tablets.     voriconazole (VFEND) 200 MG tablet Take 200 mg by mouth 2 (two) times daily.     No current facility-administered medications for this visit.    OBJECTIVE: Vitals:   07/08/24 0907  BP: 128/76  Pulse: 94  Resp: 16  Temp: (!) 96.6 F (35.9 C)  SpO2: 98%     Body mass index is 19.95 kg/m.    ECOG FS:0 - Asymptomatic  General: Well-developed, well-nourished, no acute distress. Eyes: Pink conjunctiva, anicteric sclera. HEENT: Normocephalic, moist mucous membranes. Lungs: No audible wheezing or coughing. Heart: Regular rate and rhythm. Abdomen: Soft, nontender, no obvious distention. Musculoskeletal: No edema, cyanosis, or clubbing. Neuro: Alert, answering all questions appropriately. Cranial nerves grossly intact. Skin: No rashes or petechiae noted. Psych: Normal affect.  LAB RESULTS:  Lab Results  Component Value Date   NA 136 07/08/2024   K 3.6 07/08/2024   CL 101 07/08/2024   CO2 25 07/08/2024   GLUCOSE 122 (H) 07/08/2024   BUN 26 (H) 07/08/2024   CREATININE 0.67 07/08/2024   CALCIUM 9.5 07/08/2024   PROT 6.9 07/08/2024   ALBUMIN 3.9  07/08/2024   AST 19 07/08/2024   ALT 16 07/08/2024   ALKPHOS 64 07/08/2024   BILITOT 1.0 07/08/2024   GFRNONAA >60 07/08/2024   GFRAA 98 05/10/2020    Lab Results  Component Value Date   WBC 0.4 (LL) 07/08/2024   NEUTROABS 0.2 (LL) 07/08/2024   HGB 7.5 (L) 07/08/2024   HCT 22.1 (L) 07/08/2024   MCV 87.0 07/08/2024   PLT 68 (L) 07/08/2024     STUDIES: No results found.  ASSESSMENT: AML.  PLAN:    AML: Patient completed her first 4 cycles of Vidaza  and venetoclax at [facility].  Her most recent bone marrow biopsy on May 14, 2024 reported a hypercellular bone marrow (90%) with marked fibrosis and erythroid predominant hematopoiesis and 4% blasts.  Her original bone marrow biopsy on November 19, 2023 reported 8% blasts.  Recently, the leukemia team at [facility] recommended increasing Vidaza  treatments from 5 to 7 days.  Continue 100 mg venetoclax on days 1 through 21.  For cycle 6, patient received 4 days of Vidaza , but then this was held for 2 days given her severe thrombocytopenia.  She will receive Vidaza  again tomorrow for a total of 5 treatments to this cycle.  Return to clinic weekly for laboratory work and consideration of possible transfusion and then in 1 month for further evaluation and consideration of cycle 7, day 1 which is currently planned for 7 total treatments.  Plan is to repeat bone marrow biopsy at [facility] after cycle 7.   Anemia: Patient's hemoglobin is trended down to 7.5.  Proceed with 1 unit of packed red blood cells today.  All blood products must be irradiated.  Patient's transfusion threshold is less than 8.0. Thrombocytopenia: Patient platelet count was 9 yesterday and after receiving 1 unit of platelets, this improved to 68.  Monitor.  Neutropenia: ANC is 0.2 today.  This is below her baseline, but will proceed with treatment as above.   Prophylaxis: Continue Valtrex, cefdinir, and posaconazole as prescribed.  Patient expressed understanding and was in agreement with this  plan. She also understands that She can call clinic at any time with any questions, concerns, or complaints.    Cancer Staging  AML (acute myelogenous leukemia) (HCC) Staging form: Acute Myeloid Leukemia, AJCC 8th Edition - Clinical stage from 05/29/2024: Zubrod performance status: 0, Cytogenetics (20 metaphase): Unknown, NPM1-, CEBPA-, FLT3- - Signed by Jacobo Evalene PARAS, MD on 05/29/2024 Stage prefix: Initial diagnosis   Evalene PARAS Jacobo, MD   07/08/2024 9:51 AM

## 2024-07-09 ENCOUNTER — Inpatient Hospital Stay

## 2024-07-09 VITALS — BP 122/62 | HR 78 | Temp 97.0°F | Resp 18

## 2024-07-09 DIAGNOSIS — M810 Age-related osteoporosis without current pathological fracture: Secondary | ICD-10-CM | POA: Diagnosis not present

## 2024-07-09 DIAGNOSIS — C9200 Acute myeloblastic leukemia, not having achieved remission: Secondary | ICD-10-CM

## 2024-07-09 LAB — TYPE AND SCREEN
ABO/RH(D): O POS
Antibody Screen: NEGATIVE
Unit division: 0

## 2024-07-09 LAB — BPAM RBC
Blood Product Expiration Date: 202511072359
ISSUE DATE / TIME: 202510211040
Unit Type and Rh: 202511072359
Unit Type and Rh: 5100

## 2024-07-09 MED ORDER — ONDANSETRON HCL 8 MG PO TABS
8.0000 mg | ORAL_TABLET | Freq: Once | ORAL | Status: AC
  Administered 2024-07-09: 8 mg via ORAL
  Filled 2024-07-09: qty 1

## 2024-07-09 MED ORDER — AZACITIDINE CHEMO SQ INJECTION
75.0000 mg/m2 | Freq: Once | INTRAMUSCULAR | Status: AC
  Administered 2024-07-09: 115 mg via SUBCUTANEOUS
  Filled 2024-07-09: qty 4.6

## 2024-07-09 NOTE — Patient Instructions (Signed)
 [facility] - A DEPT OF . [facility]  Discharge Instructions: Thank you for choosing [facility] to provide your oncology and hematology care.  If you have a lab appointment with the Cancer Center, please go directly to the Cancer Center and check in at the registration area.  Wear comfortable clothing and clothing appropriate for easy access to any Portacath or PICC line.   We strive to give you quality time with your provider. You may need to reschedule your appointment if you arrive late (15 or more minutes).  Arriving late affects you and other patients whose appointments are after yours.  Also, if you miss three or more appointments without notifying the office, you may be dismissed from the clinic at the provider's discretion.      For prescription refill requests, have your pharmacy contact our office and allow 72 hours for refills to be completed.    Today you received the following chemotherapy and/or immunotherapy agents VIDAZA        To help prevent nausea and vomiting after your treatment, we encourage you to take your nausea medication as directed.  BELOW ARE SYMPTOMS THAT SHOULD BE REPORTED IMMEDIATELY: *FEVER GREATER THAN 100.4 F (38 C) OR HIGHER *CHILLS OR SWEATING *NAUSEA AND VOMITING THAT IS NOT CONTROLLED WITH YOUR NAUSEA MEDICATION *UNUSUAL SHORTNESS OF BREATH *UNUSUAL BRUISING OR BLEEDING *URINARY PROBLEMS (pain or burning when urinating, or frequent urination) *BOWEL PROBLEMS (unusual diarrhea, constipation, pain near the anus) TENDERNESS IN MOUTH AND THROAT WITH OR WITHOUT PRESENCE OF ULCERS (sore throat, sores in mouth, or a toothache) UNUSUAL RASH, SWELLING OR PAIN  UNUSUAL VAGINAL DISCHARGE OR ITCHING   Items with * indicate a potential emergency and should be followed up as soon as possible or go to the Emergency Department if any problems should occur.  Please show the CHEMOTHERAPY ALERT CARD or IMMUNOTHERAPY  ALERT CARD at check-in to the Emergency Department and triage nurse.  Should you have questions after your visit or need to cancel or reschedule your appointment, please contact [facility] - A DEPT OF [facility]  [phone] and follow the prompts.  Office hours are 8:00 a.m. to 4:30 p.m. Monday - Friday. Please note that voicemails left after 4:00 p.m. may not be returned until the following business day.  We are closed weekends and major holidays. You have access to a nurse at all times for urgent questions. Please call the main number to the clinic [phone] and follow the prompts.  For any non-urgent questions, you may also contact your provider using MyChart. We now offer e-Visits for anyone 77 and older to request care online for non-urgent symptoms. For details visit [email or web address].   Also download the MyChart app! Go to the app store, search MyChart, open the app, select [facility], and log in with your MyChart username and password.  Azacitidine  Injection What is this medication? AZACITIDINE  (ay [employer] i deen) treats blood and bone marrow cancers. It works by slowing down the growth of cancer cells. This medicine may be used for other purposes; ask your health care provider or pharmacist if you have questions. COMMON BRAND NAME(S): Vidaza  What should I tell my care team before I take this medication? They need to know if you have any of these conditions: Kidney disease Liver disease Low blood cell levels, such as low white cells, platelets, or red blood cells Low levels of albumin in  the blood Low levels of bicarbonate in the blood An unusual or allergic reaction to azacitidine , mannitol, other medications, foods, dyes, or preservatives If you or your partner are pregnant or trying to get pregnant Breast-feeding How should I use this medication? This medication is injected into a vein or under the skin. It is given by your care  team in a hospital or clinic setting. Talk to your care team about the use of this medication in children. While it may be prescribed for children as young as 1 month for selected conditions, precautions do apply. Overdosage: If you think you have taken too much of this medicine contact a poison control center or emergency room at once. NOTE: This medicine is only for you. Do not share this medicine with others. What if I miss a dose? Keep appointments for follow-up doses. It is important not to miss your dose. Call your care team if you are unable to keep an appointment. What may interact with this medication? Interactions are not expected. This list may not describe all possible interactions. Give your health care provider a list of all the medicines, herbs, non-prescription drugs, or dietary supplements you use. Also tell them if you smoke, drink alcohol, or use illegal drugs. Some items may interact with your medicine. What should I watch for while using this medication? Your condition will be monitored carefully while you are receiving this medication. This medication may make you feel generally unwell. This is not uncommon as chemotherapy can affect healthy cells as well as cancer cells. Report any side effects. Continue your course of treatment even though you feel ill unless your care team tells you to stop. You may need blood work done while you are taking this medication. Other product types may be available that contain the medication azacitidine . The injection and oral products should not be used in place of one another. Talk to your care team if you have questions. This medication can cause serious side effects. To reduce the risk, your care team may give you other medications to take before receiving this one. Be sure to follow the directions from your care team. This medication may increase your risk of getting an infection. Call your care team for advice if you get a fever, chills, sore  throat, or other symptoms of a cold or flu. Do not treat yourself. Try to avoid being around people who are sick. Avoid taking medications that contain aspirin, acetaminophen , ibuprofen , naproxen, or ketoprofen unless instructed by your care team. These medications may hide a fever. Be careful brushing or flossing your teeth or using a toothpick because you may get an infection or bleed more easily. If you have any dental work done, tell your dentist you are receiving this medication. Talk to your care team if you or your partner may be pregnant. Serious birth defects can occur if you take this medication during pregnancy and for 6 months after the last dose. You will need a negative pregnancy test before starting this medication. Contraception is recommended while taking this medication and for 6 months after the last dose. Your care team can help you find the option that works for you. If your partner can get pregnant, use a condom during sex while taking this medication and for 3 months after the last dose. Do not breastfeed while taking this medication and for 1 week after the last dose. This medication may cause infertility. Talk to your care team if you are concerned about your fertility.  What side effects may I notice from receiving this medication? Side effects that you should report to your care team as soon as possible: Allergic reactions--skin rash, itching, hives, swelling of the face, lips, tongue, or throat Infection--fever, chills, cough, sore throat, wounds that don't heal, pain or trouble when passing urine, general feeling of discomfort or being unwell Kidney injury--decrease in the amount of urine, swelling of the ankles, hands, or feet Liver injury--right upper belly pain, loss of appetite, nausea, light-colored stool, dark yellow or brown urine, yellowing skin or eyes, unusual weakness or fatigue Low red blood cell level--unusual weakness or fatigue, dizziness, headache, trouble  breathing Tumor lysis syndrome (TLS)--nausea, vomiting, diarrhea, decrease in the amount of urine, dark urine, unusual weakness or fatigue, confusion, muscle pain or cramps, fast or irregular heartbeat, joint pain Unusual bruising or bleeding Side effects that usually do not require medical attention (report to your care team if they continue or are bothersome): Constipation Diarrhea Nausea Pain, redness, or irritation at injection site Vomiting This list may not describe all possible side effects. Call your doctor for medical advice about side effects. You may report side effects to FDA at 1-800-FDA-1088. Where should I keep my medication? This medication is given in a hospital or clinic. It will not be stored at home. NOTE: This sheet is a summary. It may not cover all possible information. If you have questions about this medicine, talk to your doctor, pharmacist, or health care provider.  2024 Elsevier/Gold Standard (2023-05-07 00:00:00)

## 2024-07-09 NOTE — Progress Notes (Signed)
 Pt presents to clinic for vidaza  injection.  Pt states she feels abdomen is healing.  Blood blister to right abdomen is no longer draining.  Bruising is improved.  Pt states she feels comfortable to receive treatment today.

## 2024-07-15 ENCOUNTER — Other Ambulatory Visit: Payer: Self-pay | Admitting: *Deleted

## 2024-07-15 ENCOUNTER — Other Ambulatory Visit: Payer: Self-pay | Admitting: Oncology

## 2024-07-15 ENCOUNTER — Inpatient Hospital Stay

## 2024-07-15 DIAGNOSIS — C9200 Acute myeloblastic leukemia, not having achieved remission: Secondary | ICD-10-CM

## 2024-07-15 DIAGNOSIS — M810 Age-related osteoporosis without current pathological fracture: Secondary | ICD-10-CM | POA: Diagnosis not present

## 2024-07-15 LAB — CBC WITH DIFFERENTIAL/PLATELET
Abs Immature Granulocytes: 0.01 K/uL (ref 0.00–0.07)
Basophils Absolute: 0 K/uL (ref 0.0–0.1)
Basophils Relative: 0 %
Eosinophils Absolute: 0 K/uL (ref 0.0–0.5)
Eosinophils Relative: 0 %
HCT: 24.4 % — ABNORMAL LOW (ref 36.0–46.0)
Hemoglobin: 8 g/dL — ABNORMAL LOW (ref 12.0–15.0)
Immature Granulocytes: 2 %
Lymphocytes Relative: 22 %
Lymphs Abs: 0.2 K/uL — ABNORMAL LOW (ref 0.7–4.0)
MCH: 29.4 pg (ref 26.0–34.0)
MCHC: 32.8 g/dL (ref 30.0–36.0)
MCV: 89.7 fL (ref 80.0–100.0)
Monocytes Absolute: 0.1 K/uL (ref 0.1–1.0)
Monocytes Relative: 12 %
Neutro Abs: 0.4 K/uL — CL (ref 1.7–7.7)
Neutrophils Relative %: 64 %
Platelets: 26 K/uL — ABNORMAL LOW (ref 150–400)
RBC: 2.72 MIL/uL — ABNORMAL LOW (ref 3.87–5.11)
RDW: 15.9 % — ABNORMAL HIGH (ref 11.5–15.5)
WBC: 0.7 K/uL — CL (ref 4.0–10.5)
nRBC: 5.9 % — ABNORMAL HIGH (ref 0.0–0.2)

## 2024-07-15 LAB — PREPARE RBC (CROSSMATCH)

## 2024-07-16 ENCOUNTER — Inpatient Hospital Stay

## 2024-07-16 DIAGNOSIS — C9200 Acute myeloblastic leukemia, not having achieved remission: Secondary | ICD-10-CM

## 2024-07-16 DIAGNOSIS — M810 Age-related osteoporosis without current pathological fracture: Secondary | ICD-10-CM | POA: Diagnosis not present

## 2024-07-16 MED ORDER — ACETAMINOPHEN 325 MG PO TABS
650.0000 mg | ORAL_TABLET | Freq: Once | ORAL | Status: AC
  Administered 2024-07-16: 650 mg via ORAL
  Filled 2024-07-16: qty 2

## 2024-07-16 MED ORDER — DIPHENHYDRAMINE HCL 50 MG/ML IJ SOLN
25.0000 mg | Freq: Once | INTRAMUSCULAR | Status: AC
  Administered 2024-07-16: 25 mg via INTRAVENOUS
  Filled 2024-07-16: qty 1

## 2024-07-16 MED ORDER — SODIUM CHLORIDE 0.9% IV SOLUTION
250.0000 mL | INTRAVENOUS | Status: DC
  Filled 2024-07-16: qty 250

## 2024-07-16 NOTE — Patient Instructions (Signed)

## 2024-07-17 LAB — TYPE AND SCREEN
ABO/RH(D): O POS
Antibody Screen: NEGATIVE
Unit division: 0

## 2024-07-17 LAB — BPAM RBC
Blood Product Expiration Date: 202511142359
ISSUE DATE / TIME: 202510290843
Unit Type and Rh: 202511142359
Unit Type and Rh: 5100

## 2024-07-21 ENCOUNTER — Other Ambulatory Visit: Payer: Self-pay | Admitting: *Deleted

## 2024-07-21 DIAGNOSIS — C9200 Acute myeloblastic leukemia, not having achieved remission: Secondary | ICD-10-CM

## 2024-07-22 ENCOUNTER — Telehealth: Payer: Self-pay | Admitting: *Deleted

## 2024-07-22 ENCOUNTER — Inpatient Hospital Stay: Attending: Oncology

## 2024-07-22 DIAGNOSIS — Z853 Personal history of malignant neoplasm of breast: Secondary | ICD-10-CM | POA: Diagnosis not present

## 2024-07-22 DIAGNOSIS — Z5111 Encounter for antineoplastic chemotherapy: Secondary | ICD-10-CM | POA: Insufficient documentation

## 2024-07-22 DIAGNOSIS — Z7969 Long term (current) use of other immunomodulators and immunosuppressants: Secondary | ICD-10-CM | POA: Diagnosis not present

## 2024-07-22 DIAGNOSIS — Z79899 Other long term (current) drug therapy: Secondary | ICD-10-CM | POA: Insufficient documentation

## 2024-07-22 DIAGNOSIS — M810 Age-related osteoporosis without current pathological fracture: Secondary | ICD-10-CM | POA: Diagnosis not present

## 2024-07-22 DIAGNOSIS — C9200 Acute myeloblastic leukemia, not having achieved remission: Secondary | ICD-10-CM | POA: Insufficient documentation

## 2024-07-22 DIAGNOSIS — Z79624 Long term (current) use of inhibitors of nucleotide synthesis: Secondary | ICD-10-CM | POA: Insufficient documentation

## 2024-07-22 LAB — CBC WITH DIFFERENTIAL/PLATELET
Basophils Absolute: 0 K/uL (ref 0.0–0.1)
Basophils Relative: 0 %
Eosinophils Absolute: 0 K/uL (ref 0.0–0.5)
Eosinophils Relative: 0 %
HCT: 26 % — ABNORMAL LOW (ref 36.0–46.0)
Hemoglobin: 8.6 g/dL — ABNORMAL LOW (ref 12.0–15.0)
Lymphocytes Relative: 38 %
Lymphs Abs: 0.3 K/uL — ABNORMAL LOW (ref 0.7–4.0)
MCH: 30.5 pg (ref 26.0–34.0)
MCHC: 33.1 g/dL (ref 30.0–36.0)
MCV: 92.2 fL (ref 80.0–100.0)
Monocytes Absolute: 0.1 K/uL (ref 0.1–1.0)
Monocytes Relative: 8 %
Neutro Abs: 0.4 K/uL — CL (ref 1.7–7.7)
Neutrophils Relative %: 53 %
Other: 1 %
Platelets: 47 K/uL — ABNORMAL LOW (ref 150–400)
RBC: 2.82 MIL/uL — ABNORMAL LOW (ref 3.87–5.11)
RDW: 16.9 % — ABNORMAL HIGH (ref 11.5–15.5)
WBC: 0.7 K/uL — CL (ref 4.0–10.5)
nRBC: 15.9 % — ABNORMAL HIGH (ref 0.0–0.2)

## 2024-07-22 LAB — SAMPLE TO BLOOD BANK

## 2024-07-22 NOTE — Telephone Encounter (Signed)
 RN placed call to patient to update on lab results, hemoglobin 8.6 and platelet count 47. Per MD patient does not require transfusion tomorrow. Patient verbalized understanding and is in agreement with plan. Scheduling and infusion updated that appointment for tomorrow is not needed.

## 2024-07-23 ENCOUNTER — Inpatient Hospital Stay

## 2024-07-24 ENCOUNTER — Encounter: Payer: Self-pay | Admitting: Oncology

## 2024-07-28 ENCOUNTER — Ambulatory Visit: Admitting: Oncology

## 2024-07-28 ENCOUNTER — Other Ambulatory Visit

## 2024-07-28 ENCOUNTER — Inpatient Hospital Stay

## 2024-07-28 ENCOUNTER — Inpatient Hospital Stay (HOSPITAL_BASED_OUTPATIENT_CLINIC_OR_DEPARTMENT_OTHER): Admitting: Oncology

## 2024-07-28 ENCOUNTER — Encounter: Payer: Self-pay | Admitting: Oncology

## 2024-07-28 VITALS — BP 118/58 | HR 89 | Temp 96.6°F | Resp 18 | Wt 112.0 lb

## 2024-07-28 DIAGNOSIS — C9200 Acute myeloblastic leukemia, not having achieved remission: Secondary | ICD-10-CM

## 2024-07-28 DIAGNOSIS — Z5111 Encounter for antineoplastic chemotherapy: Secondary | ICD-10-CM | POA: Diagnosis not present

## 2024-07-28 LAB — CBC WITH DIFFERENTIAL (CANCER CENTER ONLY)
Abs Immature Granulocytes: 0.02 K/uL (ref 0.00–0.07)
Basophils Absolute: 0 K/uL (ref 0.0–0.1)
Basophils Relative: 0 %
Eosinophils Absolute: 0 K/uL (ref 0.0–0.5)
Eosinophils Relative: 0 %
HCT: 25.1 % — ABNORMAL LOW (ref 36.0–46.0)
Hemoglobin: 8.4 g/dL — ABNORMAL LOW (ref 12.0–15.0)
Immature Granulocytes: 5 %
Lymphocytes Relative: 44 %
Lymphs Abs: 0.2 K/uL — ABNORMAL LOW (ref 0.7–4.0)
MCH: 31 pg (ref 26.0–34.0)
MCHC: 33.5 g/dL (ref 30.0–36.0)
MCV: 92.6 fL (ref 80.0–100.0)
Monocytes Absolute: 0.1 K/uL (ref 0.1–1.0)
Monocytes Relative: 24 %
Neutro Abs: 0.1 K/uL — CL (ref 1.7–7.7)
Neutrophils Relative %: 27 %
Platelet Count: 43 K/uL — ABNORMAL LOW (ref 150–400)
RBC: 2.71 MIL/uL — ABNORMAL LOW (ref 3.87–5.11)
RDW: 17.5 % — ABNORMAL HIGH (ref 11.5–15.5)
WBC Count: 0.4 K/uL — CL (ref 4.0–10.5)
nRBC: 4.9 % — ABNORMAL HIGH (ref 0.0–0.2)

## 2024-07-28 LAB — SAMPLE TO BLOOD BANK

## 2024-07-28 LAB — BASIC METABOLIC PANEL - CANCER CENTER ONLY
Anion gap: 9 (ref 5–15)
BUN: 27 mg/dL — ABNORMAL HIGH (ref 8–23)
CO2: 24 mmol/L (ref 22–32)
Calcium: 9.2 mg/dL (ref 8.9–10.3)
Chloride: 102 mmol/L (ref 98–111)
Creatinine: 0.61 mg/dL (ref 0.44–1.00)
GFR, Estimated: >60 mL/min (ref >60)
Glucose, Bld: 121 mg/dL — ABNORMAL HIGH (ref 70–99)
Potassium: 4.1 mmol/L (ref 3.5–5.1)
Sodium: 135 mmol/L (ref 135–145)

## 2024-07-28 MED ORDER — AZACITIDINE CHEMO SQ INJECTION
75.0000 mg/m2 | Freq: Once | INTRAMUSCULAR | Status: AC
  Administered 2024-07-28: 115 mg via SUBCUTANEOUS
  Filled 2024-07-28: qty 4.6

## 2024-07-28 MED ORDER — ONDANSETRON HCL 8 MG PO TABS
8.0000 mg | ORAL_TABLET | Freq: Once | ORAL | Status: AC
  Administered 2024-07-28: 8 mg via ORAL
  Filled 2024-07-28: qty 1

## 2024-07-28 NOTE — Patient Instructions (Signed)
 Azacitidine Injection What is this medication? AZACITIDINE (ay za SITE i deen) treats blood and bone marrow cancers. It works by slowing down the growth of cancer cells. This medicine may be used for other purposes; ask your health care provider or pharmacist if you have questions. COMMON BRAND NAME(S): Vidaza What should I tell my care team before I take this medication? They need to know if you have any of these conditions: Kidney disease Liver disease Low blood cell levels, such as low white cells, platelets, or red blood cells Low levels of albumin in the blood Low levels of bicarbonate in the blood An unusual or allergic reaction to azacitidine, mannitol, other medications, foods, dyes, or preservatives If you or your partner are pregnant or trying to get pregnant Breast-feeding How should I use this medication? This medication is injected into a vein or under the skin. It is given by your care team in a hospital or clinic setting. Talk to your care team about the use of this medication in children. While it may be prescribed for children as young as 1 month for selected conditions, precautions do apply. Overdosage: If you think you have taken too much of this medicine contact a poison control center or emergency room at once. NOTE: This medicine is only for you. Do not share this medicine with others. What if I miss a dose? Keep appointments for follow-up doses. It is important not to miss your dose. Call your care team if you are unable to keep an appointment. What may interact with this medication? Interactions are not expected. This list may not describe all possible interactions. Give your health care provider a list of all the medicines, herbs, non-prescription drugs, or dietary supplements you use. Also tell them if you smoke, drink alcohol, or use illegal drugs. Some items may interact with your medicine. What should I watch for while using this medication? Your condition will  be monitored carefully while you are receiving this medication. This medication may make you feel generally unwell. This is not uncommon as chemotherapy can affect healthy cells as well as cancer cells. Report any side effects. Continue your course of treatment even though you feel ill unless your care team tells you to stop. You may need blood work done while you are taking this medication. Other product types may be available that contain the medication azacitidine. The injection and oral products should not be used in place of one another. Talk to your care team if you have questions. This medication can cause serious side effects. To reduce the risk, your care team may give you other medications to take before receiving this one. Be sure to follow the directions from your care team. This medication may increase your risk of getting an infection. Call your care team for advice if you get a fever, chills, sore throat, or other symptoms of a cold or flu. Do not treat yourself. Try to avoid being around people who are sick. Avoid taking medications that contain aspirin, acetaminophen, ibuprofen, naproxen, or ketoprofen unless instructed by your care team. These medications may hide a fever. Be careful brushing or flossing your teeth or using a toothpick because you may get an infection or bleed more easily. If you have any dental work done, tell your dentist you are receiving this medication. Talk to your care team if you or your partner may be pregnant. Serious birth defects can occur if you take this medication during pregnancy and for 6 months after the  last dose. You will need a negative pregnancy test before starting this medication. Contraception is recommended while taking this medication and for 6 months after the last dose. Your care team can help you find the option that works for you. If your partner can get pregnant, use a condom during sex while taking this medication and for 3 months after the  last dose. Do not breastfeed while taking this medication and for 1 week after the last dose. This medication may cause infertility. Talk to your care team if you are concerned about your fertility. What side effects may I notice from receiving this medication? Side effects that you should report to your care team as soon as possible: Allergic reactions--skin rash, itching, hives, swelling of the face, lips, tongue, or throat Infection--fever, chills, cough, sore throat, wounds that don't heal, pain or trouble when passing urine, general feeling of discomfort or being unwell Kidney injury--decrease in the amount of urine, swelling of the ankles, hands, or feet Liver injury--right upper belly pain, loss of appetite, nausea, light-colored stool, dark yellow or brown urine, yellowing skin or eyes, unusual weakness or fatigue Low red blood cell level--unusual weakness or fatigue, dizziness, headache, trouble breathing Tumor lysis syndrome (TLS)--nausea, vomiting, diarrhea, decrease in the amount of urine, dark urine, unusual weakness or fatigue, confusion, muscle pain or cramps, fast or irregular heartbeat, joint pain Unusual bruising or bleeding Side effects that usually do not require medical attention (report to your care team if they continue or are bothersome): Constipation Diarrhea Nausea Pain, redness, or irritation at injection site Vomiting This list may not describe all possible side effects. Call your doctor for medical advice about side effects. You may report side effects to FDA at 1-800-FDA-1088. Where should I keep my medication? This medication is given in a hospital or clinic. It will not be stored at home. NOTE: This sheet is a summary. It may not cover all possible information. If you have questions about this medicine, talk to your doctor, pharmacist, or health care provider.  2024 Elsevier/Gold Standard (2023-05-07 00:00:00)

## 2024-07-28 NOTE — Progress Notes (Signed)
 [facility]  Telephone:(336) [phone] Fax:(336) [phone]  ID: Rebecca Mitchell OB: 1941-06-04  MR#: [id number]  RDW#:[id number]  Patient Care Team: Edman Marsa PARAS, DO as PCP - General (Family Medicine) Damian, Therisa HERO, MD as Physician Assistant (Endocrinology)  CHIEF COMPLAINT: AML   INTERVAL HISTORY: Patient returns to clinic today for repeat laboratory, further evaluation, and consideration of her next cycle of Vidaza  and venetoclax.  She currently feels well.  She does not complain of any weakness or fatigue.  She is tolerating her treatments without significant side effects.  She has no neurologic complaints.  She denies any recent fevers or illnesses.  She has a good appetite and denies weight loss.  She has no chest pain, shortness of breath, cough, or hemoptysis.  She denies any nausea, vomiting, constipation, or diarrhea.  She has no urinary complaints.  Patient offers no specific complaints today.  REVIEW OF SYSTEMS:   Review of Systems  Constitutional: Negative.  Negative for fever, malaise/fatigue and weight loss.  Respiratory: Negative.  Negative for cough, hemoptysis and shortness of breath.   Cardiovascular: Negative.  Negative for chest pain and leg swelling.  Gastrointestinal: Negative.  Negative for abdominal pain.  Genitourinary: Negative.  Negative for dysuria.  Musculoskeletal: Negative.  Negative for back pain.  Skin: Negative.  Negative for rash.  Neurological: Negative.  Negative for dizziness, focal weakness, weakness and headaches.  Psychiatric/Behavioral: Negative.  The patient is not nervous/anxious.     As per HPI. Otherwise, a complete review of systems is negative.  PAST MEDICAL HISTORY: Past Medical History:  Diagnosis Date   Anemia    Breast cancer (HCC) 09/2014   left breast lumpectomy with rad tx   Hyperlipidemia    Malignant neoplasm of lower-inner quadrant of left female breast (HCC) 07/2014   5 mm,T1a, N0; ER/ PR  positive, HER-2/neu negative   Osteoporosis    Personal history of radiation therapy 2016   left breast ca    PAST SURGICAL HISTORY: Past Surgical History:  Procedure Laterality Date   BREAST BIOPSY Left 08/2014   invasive mammary carcinoma   BREAST LUMPECTOMY Left 10/01/2014   invasive mammary carcinoma and DCIS with clear margins   BREAST SURGERY Left 10/01/2014   lumpectomy   COLONOSCOPY  2010   COLONOSCOPY WITH PROPOFOL  N/A 09/21/2015   Procedure: COLONOSCOPY WITH PROPOFOL ;  Surgeon: Louanne KANDICE Muse, MD;  Location: [facility] ENDOSCOPY;  Service: Endoscopy;  Laterality: N/A;   IR BONE MARROW BIOPSY & ASPIRATION  11/19/2023   UPPER GI ENDOSCOPY  2014    FAMILY HISTORY: Family History  Problem Relation Age of Onset   Breast cancer Sister 54   Heart disease Mother    Heart disease Father     ADVANCED DIRECTIVES (Y/N):  N  HEALTH MAINTENANCE: Social History   Tobacco Use   Smoking status: Never   Smokeless tobacco: Never  Vaping Use   Vaping status: Never Used  Substance Use Topics   Alcohol use: No    Alcohol/week: 0.0 standard drinks of alcohol   Drug use: No     Colonoscopy:  PAP:  Bone density:  Lipid panel:  No Known Allergies  Current Outpatient Medications  Medication Sig Dispense Refill   amLODipine  (NORVASC ) 5 MG tablet TAKE 1 TABLET BY MOUTH DAILY 100 tablet 3   Ascorbic Acid (VITAMIN C) 1000 MG tablet Take 1,000 mg by mouth daily.     calcium carbonate 100 mg/ml SUSP Take by mouth.  cefdinir (OMNICEF) 300 MG capsule Take 300 mg by mouth.     Cholecalciferol (VITAMIN D3) 10 MCG (400 UNIT) CAPS Take by mouth.     co-enzyme Q-10 30 MG capsule Take 30 mg by mouth 3 (three) times daily.     fluticasone  (FLONASE ) 50 MCG/ACT nasal spray 1 spray.     hydrochlorothiazide (HYDRODIURIL) 25 MG tablet Take 25 mg by mouth.     Lutein-Zeaxanthin 15-0.7 MG CAPS Take 1 capsule by mouth daily.     metoprolol  tartrate (LOPRESSOR ) 25 MG tablet Take 1 tablet (25 mg  total) by mouth 2 (two) times daily. 180 tablet 3   Omega-3 Fatty Acids (FISH OIL PO) Take 1 capsule by mouth daily.     ondansetron  (ZOFRAN ) 4 MG tablet Take 4 mg by mouth.     pravastatin  (PRAVACHOL ) 20 MG tablet Take 1 tablet (20 mg total) by mouth daily. 90 tablet 3   Saccharomyces boulardii (PROBIOTIC) 250 MG CAPS Take 250 mg by mouth daily.     valACYclovir (VALTREX) 500 MG tablet Take 500 mg by mouth daily.     venetoclax (VENCLEXTA) 100 MG tablet Take 1 tablet (100 mg total) by mouth once daily for days 1-14 of a 28 day cycle. Take with a meal and water. Do not chew, crush, or break tablets.     voriconazole (VFEND) 200 MG tablet Take 200 mg by mouth 2 (two) times daily.     FERROUS SULFATE PO Take 1 tablet by mouth once a week. (Patient not taking: Reported on 07/28/2024)     No current facility-administered medications for this visit.    OBJECTIVE: Vitals:   07/28/24 0934  BP: (!) 118/58  Pulse: 89  Resp: 18  Temp: (!) 96.6 F (35.9 C)  SpO2: 100%     Body mass index is 19.6 kg/m.    ECOG FS:0 - Asymptomatic  General: Well-developed, well-nourished, no acute distress. Eyes: Pink conjunctiva, anicteric sclera. HEENT: Normocephalic, moist mucous membranes. Lungs: No audible wheezing or coughing. Heart: Regular rate and rhythm. Abdomen: Soft, nontender, no obvious distention. Musculoskeletal: No edema, cyanosis, or clubbing. Neuro: Alert, answering all questions appropriately. Cranial nerves grossly intact. Skin: No rashes or petechiae noted. Psych: Normal affect.  LAB RESULTS:  Lab Results  Component Value Date   NA 136 07/08/2024   K 3.6 07/08/2024   CL 101 07/08/2024   CO2 25 07/08/2024   GLUCOSE 122 (H) 07/08/2024   BUN 26 (H) 07/08/2024   CREATININE 0.67 07/08/2024   CALCIUM 9.5 07/08/2024   PROT 6.9 07/08/2024   ALBUMIN 3.9 07/08/2024   AST 19 07/08/2024   ALT 16 07/08/2024   ALKPHOS 64 07/08/2024   BILITOT 1.0 07/08/2024   GFRNONAA >60 07/08/2024    GFRAA 98 05/10/2020    Lab Results  Component Value Date   WBC 0.7 (LL) 07/22/2024   NEUTROABS 0.4 (LL) 07/22/2024   HGB 8.6 (L) 07/22/2024   HCT 26.0 (L) 07/22/2024   MCV 92.2 07/22/2024   PLT 47 (L) 07/22/2024     STUDIES: No results found.  ASSESSMENT: AML.  PLAN:    AML: Patient completed her first 4 cycles of Vidaza  and venetoclax at [facility].  Her most recent bone marrow biopsy on May 14, 2024 reported a hypercellular bone marrow (90%) with marked fibrosis and erythroid predominant hematopoiesis and 4% blasts.  Her original bone marrow biopsy on November 19, 2023 reported 8% blasts.  Recently, the leukemia team at [facility] recommended increasing Vidaza  treatments from  5 to 7 days.  Continue 100 mg venetoclax on days 1 through 21.  For cycle 6, patient received 4 days of Vidaza , but then this was held for 2 days given her severe thrombocytopenia.  Proceed with cycle 7, day 1 of treatment today.  Return to clinic daily for Vidaza .  Patient will then return to clinic weekly for blood work and possible transfusion.  She has a bone marrow at [facility] biopsy scheduled on August 18, 2024.  Return to clinic in 4 weeks for further evaluation and consideration of cycle 8, day 1 pending results of biopsy.   Anemia: Hemoglobin improved to 8.4.  Patient does not require transfusion.  Weekly labs as above.  All blood products must be irradiated.  Patient's transfusion threshold is less than 8.0. Thrombocytopenia: Chronic and unchanged.  Patient's platelet count is 43.  She does not require transfusion this week.  Monitor.  Neutropenia: ANC 0.1 today.  Proceed cautiously with treatment as above.   Prophylaxis: Continue Valtrex, cefdinir, and posaconazole as prescribed.  Patient expressed understanding and was in agreement with this plan. She also understands that She can call clinic at any time with any questions, concerns, or complaints.    Cancer Staging  AML (acute myelogenous leukemia) (HCC) Staging form:  Acute Myeloid Leukemia, AJCC 8th Edition - Clinical stage from 05/29/2024: Zubrod performance status: 0, Cytogenetics (20 metaphase): Unknown, NPM1-, CEBPA-, FLT3- - Signed by Jacobo Evalene PARAS, MD on 05/29/2024 Stage prefix: Initial diagnosis   Evalene PARAS Jacobo, MD   07/28/2024 9:55 AM

## 2024-07-29 ENCOUNTER — Other Ambulatory Visit

## 2024-07-29 ENCOUNTER — Ambulatory Visit: Admitting: Oncology

## 2024-07-29 ENCOUNTER — Ambulatory Visit

## 2024-07-29 ENCOUNTER — Inpatient Hospital Stay

## 2024-07-29 VITALS — BP 108/51 | HR 75 | Temp 97.9°F | Resp 18

## 2024-07-29 DIAGNOSIS — Z5111 Encounter for antineoplastic chemotherapy: Secondary | ICD-10-CM | POA: Diagnosis not present

## 2024-07-29 DIAGNOSIS — C9200 Acute myeloblastic leukemia, not having achieved remission: Secondary | ICD-10-CM

## 2024-07-29 MED ORDER — AZACITIDINE CHEMO SQ INJECTION
75.0000 mg/m2 | Freq: Once | INTRAMUSCULAR | Status: AC
  Administered 2024-07-29: 115 mg via SUBCUTANEOUS
  Filled 2024-07-29: qty 4.6

## 2024-07-29 MED ORDER — ONDANSETRON HCL 8 MG PO TABS
8.0000 mg | ORAL_TABLET | Freq: Once | ORAL | Status: AC
  Administered 2024-07-29: 8 mg via ORAL
  Filled 2024-07-29: qty 1

## 2024-07-30 ENCOUNTER — Inpatient Hospital Stay

## 2024-07-30 ENCOUNTER — Other Ambulatory Visit: Payer: Self-pay | Admitting: *Deleted

## 2024-07-30 ENCOUNTER — Other Ambulatory Visit: Payer: Self-pay | Admitting: Oncology

## 2024-07-30 VITALS — BP 118/58 | HR 85 | Temp 97.0°F | Resp 19

## 2024-07-30 DIAGNOSIS — C9200 Acute myeloblastic leukemia, not having achieved remission: Secondary | ICD-10-CM

## 2024-07-30 DIAGNOSIS — Z5111 Encounter for antineoplastic chemotherapy: Secondary | ICD-10-CM | POA: Diagnosis not present

## 2024-07-30 LAB — CBC WITH DIFFERENTIAL/PLATELET
Abs Immature Granulocytes: 0.01 K/uL (ref 0.00–0.07)
Basophils Absolute: 0 K/uL (ref 0.0–0.1)
Basophils Relative: 0 %
Eosinophils Absolute: 0 K/uL (ref 0.0–0.5)
Eosinophils Relative: 0 %
HCT: 20.8 % — ABNORMAL LOW (ref 36.0–46.0)
Hemoglobin: 6.9 g/dL — CL (ref 12.0–15.0)
Immature Granulocytes: 3 %
Lymphocytes Relative: 40 %
Lymphs Abs: 0.2 K/uL — ABNORMAL LOW (ref 0.7–4.0)
MCH: 30.4 pg (ref 26.0–34.0)
MCHC: 33.2 g/dL (ref 30.0–36.0)
MCV: 91.6 fL (ref 80.0–100.0)
Monocytes Absolute: 0.1 K/uL (ref 0.1–1.0)
Monocytes Relative: 26 %
Neutro Abs: 0.1 K/uL — CL (ref 1.7–7.7)
Neutrophils Relative %: 31 %
Platelets: 28 K/uL — ABNORMAL LOW (ref 150–400)
RBC: 2.27 MIL/uL — ABNORMAL LOW (ref 3.87–5.11)
RDW: 17.2 % — ABNORMAL HIGH (ref 11.5–15.5)
Smear Review: NORMAL
WBC: 0.4 K/uL — CL (ref 4.0–10.5)
nRBC: 0 % (ref 0.0–0.2)

## 2024-07-30 LAB — PREPARE RBC (CROSSMATCH)

## 2024-07-30 MED ORDER — AZACITIDINE CHEMO SQ INJECTION
75.0000 mg/m2 | Freq: Once | INTRAMUSCULAR | Status: AC
  Administered 2024-07-30: 115 mg via SUBCUTANEOUS
  Filled 2024-07-30: qty 4.6

## 2024-07-30 MED ORDER — ONDANSETRON HCL 8 MG PO TABS
8.0000 mg | ORAL_TABLET | Freq: Once | ORAL | Status: AC
  Administered 2024-07-30: 8 mg via ORAL
  Filled 2024-07-30: qty 1

## 2024-07-30 NOTE — Progress Notes (Signed)
 Rebecca Mitchell                                          MRN: [id number]   07/30/2024   The [facility] Quality Team Specialist reviewed this patient medical record for the purposes of chart review for care gap closure. The following were reviewed: chart review for care gap closure-glycemic status assessment.    [facility] Quality Team

## 2024-07-31 ENCOUNTER — Inpatient Hospital Stay

## 2024-07-31 VITALS — BP 113/57 | HR 89 | Temp 98.2°F | Resp 19

## 2024-07-31 DIAGNOSIS — C9200 Acute myeloblastic leukemia, not having achieved remission: Secondary | ICD-10-CM

## 2024-07-31 DIAGNOSIS — Z5111 Encounter for antineoplastic chemotherapy: Secondary | ICD-10-CM | POA: Diagnosis not present

## 2024-07-31 MED ORDER — ACETAMINOPHEN 325 MG PO TABS
650.0000 mg | ORAL_TABLET | Freq: Once | ORAL | Status: AC
  Administered 2024-07-31: 650 mg via ORAL
  Filled 2024-07-31: qty 2

## 2024-07-31 MED ORDER — AZACITIDINE CHEMO SQ INJECTION
75.0000 mg/m2 | Freq: Once | INTRAMUSCULAR | Status: AC
  Administered 2024-07-31: 115 mg via SUBCUTANEOUS
  Filled 2024-07-31: qty 4.6

## 2024-07-31 MED ORDER — ONDANSETRON HCL 8 MG PO TABS
8.0000 mg | ORAL_TABLET | Freq: Once | ORAL | Status: AC
  Administered 2024-07-31: 8 mg via ORAL
  Filled 2024-07-31: qty 1

## 2024-07-31 MED ORDER — SODIUM CHLORIDE 0.9% IV SOLUTION
250.0000 mL | INTRAVENOUS | Status: DC
  Administered 2024-07-31: 100 mL via INTRAVENOUS
  Filled 2024-07-31: qty 250

## 2024-07-31 MED ORDER — DIPHENHYDRAMINE HCL 50 MG/ML IJ SOLN
25.0000 mg | Freq: Once | INTRAMUSCULAR | Status: AC
  Administered 2024-07-31: 25 mg via INTRAVENOUS
  Filled 2024-07-31: qty 1

## 2024-08-01 ENCOUNTER — Inpatient Hospital Stay

## 2024-08-01 ENCOUNTER — Other Ambulatory Visit: Payer: Self-pay | Admitting: *Deleted

## 2024-08-01 VITALS — BP 121/57 | HR 80 | Temp 97.3°F | Resp 16

## 2024-08-01 DIAGNOSIS — Z5111 Encounter for antineoplastic chemotherapy: Secondary | ICD-10-CM | POA: Diagnosis not present

## 2024-08-01 DIAGNOSIS — C9200 Acute myeloblastic leukemia, not having achieved remission: Secondary | ICD-10-CM

## 2024-08-01 LAB — BPAM RBC
Blood Product Expiration Date: 202511282359
ISSUE DATE / TIME: 202511131315
Unit Type and Rh: 5100

## 2024-08-01 LAB — TYPE AND SCREEN
ABO/RH(D): O POS
Antibody Screen: NEGATIVE
Unit division: 0

## 2024-08-01 MED ORDER — ONDANSETRON HCL 8 MG PO TABS
8.0000 mg | ORAL_TABLET | Freq: Once | ORAL | Status: AC
  Administered 2024-08-01: 8 mg via ORAL
  Filled 2024-08-01: qty 1

## 2024-08-01 MED ORDER — AZACITIDINE CHEMO SQ INJECTION
75.0000 mg/m2 | Freq: Once | INTRAMUSCULAR | Status: AC
  Administered 2024-08-01: 115 mg via SUBCUTANEOUS
  Filled 2024-08-01: qty 4.6

## 2024-08-01 NOTE — Patient Instructions (Signed)
 Azacitidine Injection What is this medication? AZACITIDINE (ay za SITE i deen) treats blood and bone marrow cancers. It works by slowing down the growth of cancer cells. This medicine may be used for other purposes; ask your health care provider or pharmacist if you have questions. COMMON BRAND NAME(S): Vidaza What should I tell my care team before I take this medication? They need to know if you have any of these conditions: Kidney disease Liver disease Low blood cell levels, such as low white cells, platelets, or red blood cells Low levels of albumin in the blood Low levels of bicarbonate in the blood An unusual or allergic reaction to azacitidine, mannitol, other medications, foods, dyes, or preservatives If you or your partner are pregnant or trying to get pregnant Breast-feeding How should I use this medication? This medication is injected into a vein or under the skin. It is given by your care team in a hospital or clinic setting. Talk to your care team about the use of this medication in children. While it may be prescribed for children as young as 1 month for selected conditions, precautions do apply. Overdosage: If you think you have taken too much of this medicine contact a poison control center or emergency room at once. NOTE: This medicine is only for you. Do not share this medicine with others. What if I miss a dose? Keep appointments for follow-up doses. It is important not to miss your dose. Call your care team if you are unable to keep an appointment. What may interact with this medication? Interactions are not expected. This list may not describe all possible interactions. Give your health care provider a list of all the medicines, herbs, non-prescription drugs, or dietary supplements you use. Also tell them if you smoke, drink alcohol, or use illegal drugs. Some items may interact with your medicine. What should I watch for while using this medication? Your condition will  be monitored carefully while you are receiving this medication. This medication may make you feel generally unwell. This is not uncommon as chemotherapy can affect healthy cells as well as cancer cells. Report any side effects. Continue your course of treatment even though you feel ill unless your care team tells you to stop. You may need blood work done while you are taking this medication. Other product types may be available that contain the medication azacitidine. The injection and oral products should not be used in place of one another. Talk to your care team if you have questions. This medication can cause serious side effects. To reduce the risk, your care team may give you other medications to take before receiving this one. Be sure to follow the directions from your care team. This medication may increase your risk of getting an infection. Call your care team for advice if you get a fever, chills, sore throat, or other symptoms of a cold or flu. Do not treat yourself. Try to avoid being around people who are sick. Avoid taking medications that contain aspirin, acetaminophen, ibuprofen, naproxen, or ketoprofen unless instructed by your care team. These medications may hide a fever. Be careful brushing or flossing your teeth or using a toothpick because you may get an infection or bleed more easily. If you have any dental work done, tell your dentist you are receiving this medication. Talk to your care team if you or your partner may be pregnant. Serious birth defects can occur if you take this medication during pregnancy and for 6 months after the  last dose. You will need a negative pregnancy test before starting this medication. Contraception is recommended while taking this medication and for 6 months after the last dose. Your care team can help you find the option that works for you. If your partner can get pregnant, use a condom during sex while taking this medication and for 3 months after the  last dose. Do not breastfeed while taking this medication and for 1 week after the last dose. This medication may cause infertility. Talk to your care team if you are concerned about your fertility. What side effects may I notice from receiving this medication? Side effects that you should report to your care team as soon as possible: Allergic reactions--skin rash, itching, hives, swelling of the face, lips, tongue, or throat Infection--fever, chills, cough, sore throat, wounds that don't heal, pain or trouble when passing urine, general feeling of discomfort or being unwell Kidney injury--decrease in the amount of urine, swelling of the ankles, hands, or feet Liver injury--right upper belly pain, loss of appetite, nausea, light-colored stool, dark yellow or brown urine, yellowing skin or eyes, unusual weakness or fatigue Low red blood cell level--unusual weakness or fatigue, dizziness, headache, trouble breathing Tumor lysis syndrome (TLS)--nausea, vomiting, diarrhea, decrease in the amount of urine, dark urine, unusual weakness or fatigue, confusion, muscle pain or cramps, fast or irregular heartbeat, joint pain Unusual bruising or bleeding Side effects that usually do not require medical attention (report to your care team if they continue or are bothersome): Constipation Diarrhea Nausea Pain, redness, or irritation at injection site Vomiting This list may not describe all possible side effects. Call your doctor for medical advice about side effects. You may report side effects to FDA at 1-800-FDA-1088. Where should I keep my medication? This medication is given in a hospital or clinic. It will not be stored at home. NOTE: This sheet is a summary. It may not cover all possible information. If you have questions about this medicine, talk to your doctor, pharmacist, or health care provider.  2024 Elsevier/Gold Standard (2023-05-07 00:00:00)

## 2024-08-04 ENCOUNTER — Inpatient Hospital Stay

## 2024-08-04 ENCOUNTER — Encounter: Payer: Self-pay | Admitting: Oncology

## 2024-08-04 ENCOUNTER — Inpatient Hospital Stay: Admitting: Oncology

## 2024-08-04 VITALS — BP 137/68 | HR 89 | Temp 96.5°F | Resp 16 | Ht 63.39 in | Wt 113.8 lb

## 2024-08-04 DIAGNOSIS — C9200 Acute myeloblastic leukemia, not having achieved remission: Secondary | ICD-10-CM

## 2024-08-04 DIAGNOSIS — Z5111 Encounter for antineoplastic chemotherapy: Secondary | ICD-10-CM | POA: Diagnosis not present

## 2024-08-04 LAB — CMP (CANCER CENTER ONLY)
ALT: 13 U/L (ref 0–44)
AST: 16 U/L (ref 15–41)
Albumin: 3.9 g/dL (ref 3.5–5.0)
Alkaline Phosphatase: 59 U/L (ref 38–126)
Anion gap: 8 (ref 5–15)
BUN: 26 mg/dL — ABNORMAL HIGH (ref 8–23)
CO2: 26 mmol/L (ref 22–32)
Calcium: 9.5 mg/dL (ref 8.9–10.3)
Chloride: 100 mmol/L (ref 98–111)
Creatinine: 0.57 mg/dL (ref 0.44–1.00)
GFR, Estimated: >60 mL/min (ref >60)
Glucose, Bld: 129 mg/dL — ABNORMAL HIGH (ref 70–99)
Potassium: 3.9 mmol/L (ref 3.5–5.1)
Sodium: 134 mmol/L — ABNORMAL LOW (ref 135–145)
Total Bilirubin: 1 mg/dL (ref 0.0–1.2)
Total Protein: 7.1 g/dL (ref 6.5–8.1)

## 2024-08-04 LAB — CBC WITH DIFFERENTIAL/PLATELET
Abs Immature Granulocytes: 0.02 K/uL (ref 0.00–0.07)
Basophils Absolute: 0 K/uL (ref 0.0–0.1)
Basophils Relative: 0 %
Eosinophils Absolute: 0 K/uL (ref 0.0–0.5)
Eosinophils Relative: 0 %
HCT: 24.5 % — ABNORMAL LOW (ref 36.0–46.0)
Hemoglobin: 8.2 g/dL — ABNORMAL LOW (ref 12.0–15.0)
Immature Granulocytes: 5 %
Lymphocytes Relative: 38 %
Lymphs Abs: 0.1 K/uL — ABNORMAL LOW (ref 0.7–4.0)
MCH: 29.9 pg (ref 26.0–34.0)
MCHC: 33.5 g/dL (ref 30.0–36.0)
MCV: 89.4 fL (ref 80.0–100.0)
Monocytes Absolute: 0 K/uL — ABNORMAL LOW (ref 0.1–1.0)
Monocytes Relative: 8 %
Neutro Abs: 0.2 K/uL — CL (ref 1.7–7.7)
Neutrophils Relative %: 49 %
Platelets: 11 K/uL — ABNORMAL LOW (ref 150–400)
RBC: 2.74 MIL/uL — ABNORMAL LOW (ref 3.87–5.11)
RDW: 16.1 % — ABNORMAL HIGH (ref 11.5–15.5)
WBC: 0.4 K/uL — CL (ref 4.0–10.5)
nRBC: 0 % (ref 0.0–0.2)

## 2024-08-04 LAB — SAMPLE TO BLOOD BANK

## 2024-08-04 LAB — PREPARE RBC (CROSSMATCH)

## 2024-08-04 NOTE — Progress Notes (Signed)
 Over the weekend she had some blood blisters pop up on both of her sides. The pain isn't constant but when she is hurting it is a 7.

## 2024-08-04 NOTE — Progress Notes (Signed)
 [facility]  Telephone:(336) [phone] Fax:(336) [phone]  ID: Rebecca Mitchell Halsted OB: 1940-11-24  MR#: [id number]  RDW#:[id number]  Patient Care Team: Edman Marsa PARAS, DO as PCP - General (Family Medicine) Damian, Therisa HERO, MD as Physician Assistant (Endocrinology)  CHIEF COMPLAINT: AML   INTERVAL HISTORY: Patient returns to clinic today for further evaluation and consideration of cycle 7, day 6 of Vidaza  and venetoclax.  Patient has developed blood blisters at the site of her Vidaza  injections and also has noticed increased weakness and fatigue.  She otherwise feels well. She is tolerating her treatments without significant side effects.  She has no neurologic complaints.  She denies any recent fevers or illnesses.  She has a good appetite and denies weight loss.  She has no chest pain, shortness of breath, cough, or hemoptysis.  She denies any nausea, vomiting, constipation, or diarrhea.  She has no urinary complaints.  Patient offers no further specific complaints today.  REVIEW OF SYSTEMS:   Review of Systems  Constitutional: Negative.  Negative for fever, malaise/fatigue and weight loss.  Respiratory: Negative.  Negative for cough, hemoptysis and shortness of breath.   Cardiovascular: Negative.  Negative for chest pain and leg swelling.  Gastrointestinal: Negative.  Negative for abdominal pain.  Genitourinary: Negative.  Negative for dysuria.  Musculoskeletal: Negative.  Negative for back pain.  Skin: Negative.  Negative for rash.  Neurological: Negative.  Negative for dizziness, focal weakness, weakness and headaches.  Psychiatric/Behavioral: Negative.  The patient is not nervous/anxious.     As per HPI. Otherwise, a complete review of systems is negative.  PAST MEDICAL HISTORY: Past Medical History:  Diagnosis Date   Anemia    Breast cancer (HCC) 09/2014   left breast lumpectomy with rad tx   Hyperlipidemia    Malignant neoplasm of lower-inner quadrant of  left female breast (HCC) 07/2014   5 mm,T1a, N0; ER/ PR positive, HER-2/neu negative   Osteoporosis    Personal history of radiation therapy 2016   left breast ca    PAST SURGICAL HISTORY: Past Surgical History:  Procedure Laterality Date   BREAST BIOPSY Left 08/2014   invasive mammary carcinoma   BREAST LUMPECTOMY Left 10/01/2014   invasive mammary carcinoma and DCIS with clear margins   BREAST SURGERY Left 10/01/2014   lumpectomy   COLONOSCOPY  2010   COLONOSCOPY WITH PROPOFOL  N/A 09/21/2015   Procedure: COLONOSCOPY WITH PROPOFOL ;  Surgeon: Louanne KANDICE Muse, MD;  Location: [facility] ENDOSCOPY;  Service: Endoscopy;  Laterality: N/A;   IR BONE MARROW BIOPSY & ASPIRATION  11/19/2023   UPPER GI ENDOSCOPY  2014    FAMILY HISTORY: Family History  Problem Relation Age of Onset   Breast cancer Sister 4   Heart disease Mother    Heart disease Father     ADVANCED DIRECTIVES (Y/N):  N  HEALTH MAINTENANCE: Social History   Tobacco Use   Smoking status: Never   Smokeless tobacco: Never  Vaping Use   Vaping status: Never Used  Substance Use Topics   Alcohol use: No    Alcohol/week: 0.0 standard drinks of alcohol   Drug use: No     Colonoscopy:  PAP:  Bone density:  Lipid panel:  No Known Allergies  Current Outpatient Medications  Medication Sig Dispense Refill   amLODipine  (NORVASC ) 5 MG tablet TAKE 1 TABLET BY MOUTH DAILY 100 tablet 3   Ascorbic Acid (VITAMIN C) 1000 MG tablet Take 1,000 mg by mouth daily.     calcium carbonate  100 mg/ml SUSP Take by mouth.     cefdinir (OMNICEF) 300 MG capsule Take 300 mg by mouth.     Cholecalciferol (VITAMIN D3) 10 MCG (400 UNIT) CAPS Take by mouth.     co-enzyme Q-10 30 MG capsule Take 30 mg by mouth 3 (three) times daily.     fluticasone  (FLONASE ) 50 MCG/ACT nasal spray 1 spray.     hydrochlorothiazide (HYDRODIURIL) 25 MG tablet Take 25 mg by mouth.     Lutein-Zeaxanthin 15-0.7 MG CAPS Take 1 capsule by mouth daily.      metoprolol  tartrate (LOPRESSOR ) 25 MG tablet Take 1 tablet (25 mg total) by mouth 2 (two) times daily. 180 tablet 3   Omega-3 Fatty Acids (FISH OIL PO) Take 1 capsule by mouth daily.     ondansetron  (ZOFRAN ) 4 MG tablet Take 4 mg by mouth.     pravastatin  (PRAVACHOL ) 20 MG tablet Take 1 tablet (20 mg total) by mouth daily. 90 tablet 3   Saccharomyces boulardii (PROBIOTIC) 250 MG CAPS Take 250 mg by mouth daily.     valACYclovir (VALTREX) 500 MG tablet Take 500 mg by mouth daily.     venetoclax (VENCLEXTA) 100 MG tablet Take 1 tablet (100 mg total) by mouth once daily for days 1-14 of a 28 day cycle. Take with a meal and water. Do not chew, crush, or break tablets.     voriconazole (VFEND) 200 MG tablet Take 200 mg by mouth 2 (two) times daily.     FERROUS SULFATE PO Take 1 tablet by mouth once a week. (Patient not taking: Reported on 08/04/2024)     No current facility-administered medications for this visit.    OBJECTIVE: Vitals:   08/04/24 0950  BP: 137/68  Pulse: 89  Resp: 16  Temp: (!) 96.5 F (35.8 C)  SpO2: 100%     Body mass index is 19.91 kg/m.    ECOG FS:0 - Asymptomatic  General: Well-developed, well-nourished, no acute distress. Eyes: Pink conjunctiva, anicteric sclera. HEENT: Normocephalic, moist mucous membranes. Lungs: No audible wheezing or coughing. Heart: Regular rate and rhythm. Abdomen: Soft, nontender, no obvious distention. Musculoskeletal: No edema, cyanosis, or clubbing. Neuro: Alert, answering all questions appropriately. Cranial nerves grossly intact. Skin: No rashes or petechiae noted. Psych: Normal affect.  LAB RESULTS:  Lab Results  Component Value Date   NA 134 (L) 08/04/2024   K 3.9 08/04/2024   CL 100 08/04/2024   CO2 26 08/04/2024   GLUCOSE 129 (H) 08/04/2024   BUN 26 (H) 08/04/2024   CREATININE 0.57 08/04/2024   CALCIUM 9.5 08/04/2024   PROT 7.1 08/04/2024   ALBUMIN 3.9 08/04/2024   AST 16 08/04/2024   ALT 13 08/04/2024   ALKPHOS 59  08/04/2024   BILITOT 1.0 08/04/2024   GFRNONAA >60 08/04/2024   GFRAA 98 05/10/2020    Lab Results  Component Value Date   WBC 0.4 (LL) 08/04/2024   NEUTROABS 0.2 (LL) 08/04/2024   HGB 8.2 (L) 08/04/2024   HCT 24.5 (L) 08/04/2024   MCV 89.4 08/04/2024   PLT 11 (L) 08/04/2024     STUDIES: No results found.  ASSESSMENT: AML.  PLAN:    AML: Patient completed her first 4 cycles of Vidaza  and venetoclax at [facility].  Her most recent bone marrow biopsy on May 14, 2024 reported a hypercellular bone marrow (90%) with marked fibrosis and erythroid predominant hematopoiesis and 4% blasts.  Her original bone marrow biopsy on November 19, 2023 reported 8% blasts.  Recently,  the leukemia team at [facility] recommended increasing Vidaza  treatments from 5 to 7 days.  Despite this, given her persistent pancytopenia and other significant side effects, patient has not received 7 days of Vidaza .  Continue 100 mg venetoclax on days 1 through 21.  For cycle 6, patient received 4 days of Vidaza , but then this was held for 2 days given her severe thrombocytopenia.  For cycle 7, we will cancel day 6 and 7 given her pancytopenia as well as blood blisters and local irritation from her injections.  Patient has been instructed to continue venetoclax as prescribed.  Return to clinic tomorrow for 1 unit of platelets and 1 unit packed red blood cells.  Patient without return to clinic next week for laboratory work and consideration of additional transfusion if needed.  She has a bone marrow at [facility] biopsy scheduled on August 18, 2024.  Return to clinic as previously scheduled further evaluation and consideration of cycle 8, day 1 pending results of biopsy.   Anemia: Hemoglobin 8.2, but patient is symptomatic.  Proceed with 1 unit packed red blood cells tomorrow.  All blood products must be irradiated.  Patient's transfusion threshold is less than 8.0. Thrombocytopenia: Platelets have dropped to 11.  Return to clinic tomorrow for 1  unit of platelets..  She does not require transfusion this week.  Monitor.  Neutropenia: ANC 0.2 today.   Prophylaxis: Continue Valtrex, cefdinir, and posaconazole as prescribed. Blood blister/local irritation: Continue symptomatic treatment as instructed.  Patient expressed understanding and was in agreement with this plan. She also understands that She can call clinic at any time with any questions, concerns, or complaints.    Cancer Staging  AML (acute myelogenous leukemia) (HCC) Staging form: Acute Myeloid Leukemia, AJCC 8th Edition - Clinical stage from 05/29/2024: Zubrod performance status: 0, Cytogenetics (20 metaphase): Unknown, NPM1-, CEBPA-, FLT3- - Signed by Jacobo Evalene PARAS, MD on 05/29/2024 Stage prefix: Initial diagnosis   Evalene PARAS Jacobo, MD   08/04/2024 2:39 PM

## 2024-08-05 ENCOUNTER — Inpatient Hospital Stay

## 2024-08-05 DIAGNOSIS — Z5111 Encounter for antineoplastic chemotherapy: Secondary | ICD-10-CM | POA: Diagnosis not present

## 2024-08-05 DIAGNOSIS — C9200 Acute myeloblastic leukemia, not having achieved remission: Secondary | ICD-10-CM

## 2024-08-05 MED ORDER — SODIUM CHLORIDE 0.9% IV SOLUTION
250.0000 mL | INTRAVENOUS | Status: DC
  Administered 2024-08-05: 100 mL via INTRAVENOUS
  Filled 2024-08-05: qty 250

## 2024-08-05 MED ORDER — DIPHENHYDRAMINE HCL 50 MG/ML IJ SOLN
25.0000 mg | Freq: Once | INTRAMUSCULAR | Status: AC
  Administered 2024-08-05: 25 mg via INTRAVENOUS
  Filled 2024-08-05: qty 1

## 2024-08-05 MED ORDER — ACETAMINOPHEN 325 MG PO TABS
650.0000 mg | ORAL_TABLET | Freq: Once | ORAL | Status: AC
  Administered 2024-08-05: 650 mg via ORAL
  Filled 2024-08-05: qty 2

## 2024-08-05 NOTE — Patient Instructions (Addendum)

## 2024-08-06 LAB — BPAM RBC
Blood Product Expiration Date: 202512122359
ISSUE DATE / TIME: 202511180902
Unit Type and Rh: 202512122359
Unit Type and Rh: 5100

## 2024-08-06 LAB — BPAM PLATELET PHERESIS
Blood Product Expiration Date: 202511192359
ISSUE DATE / TIME: 202511181048
Unit Type and Rh: 202511192359
Unit Type and Rh: 6200

## 2024-08-06 LAB — TYPE AND SCREEN
ABO/RH(D): O POS
Antibody Screen: NEGATIVE
Unit division: 0

## 2024-08-06 LAB — PREPARE PLATELET PHERESIS: Unit division: 0

## 2024-08-12 ENCOUNTER — Inpatient Hospital Stay

## 2024-08-12 ENCOUNTER — Telehealth: Payer: Self-pay

## 2024-08-12 ENCOUNTER — Other Ambulatory Visit: Payer: Self-pay

## 2024-08-12 DIAGNOSIS — C9200 Acute myeloblastic leukemia, not having achieved remission: Secondary | ICD-10-CM

## 2024-08-12 DIAGNOSIS — Z5111 Encounter for antineoplastic chemotherapy: Secondary | ICD-10-CM | POA: Diagnosis not present

## 2024-08-12 LAB — CBC WITH DIFFERENTIAL/PLATELET
Abs Immature Granulocytes: 0.01 K/uL (ref 0.00–0.07)
Basophils Absolute: 0 K/uL (ref 0.0–0.1)
Basophils Relative: 0 %
Eosinophils Absolute: 0 K/uL (ref 0.0–0.5)
Eosinophils Relative: 0 %
HCT: 25.7 % — ABNORMAL LOW (ref 36.0–46.0)
Hemoglobin: 8.5 g/dL — ABNORMAL LOW (ref 12.0–15.0)
Immature Granulocytes: 2 %
Lymphocytes Relative: 20 %
Lymphs Abs: 0.1 K/uL — ABNORMAL LOW (ref 0.7–4.0)
MCH: 29.8 pg (ref 26.0–34.0)
MCHC: 33.1 g/dL (ref 30.0–36.0)
MCV: 90.2 fL (ref 80.0–100.0)
Monocytes Absolute: 0.1 K/uL (ref 0.1–1.0)
Monocytes Relative: 16 %
Neutro Abs: 0.4 K/uL — CL (ref 1.7–7.7)
Neutrophils Relative %: 62 %
Platelets: 25 K/uL — ABNORMAL LOW (ref 150–400)
RBC: 2.85 MIL/uL — ABNORMAL LOW (ref 3.87–5.11)
RDW: 15.6 % — ABNORMAL HIGH (ref 11.5–15.5)
WBC: 0.6 K/uL — CL (ref 4.0–10.5)
nRBC: 8.9 % — ABNORMAL HIGH (ref 0.0–0.2)

## 2024-08-12 LAB — BASIC METABOLIC PANEL WITH GFR
Anion gap: 8 (ref 5–15)
BUN: 21 mg/dL (ref 8–23)
CO2: 25 mmol/L (ref 22–32)
Calcium: 9.2 mg/dL (ref 8.9–10.3)
Chloride: 106 mmol/L (ref 98–111)
Creatinine, Ser: 0.56 mg/dL (ref 0.44–1.00)
GFR, Estimated: >60 mL/min (ref >60)
Glucose, Bld: 133 mg/dL — ABNORMAL HIGH (ref 70–99)
Potassium: 3.9 mmol/L (ref 3.5–5.1)
Sodium: 139 mmol/L (ref 135–145)

## 2024-08-12 LAB — SAMPLE TO BLOOD BANK

## 2024-08-12 NOTE — Telephone Encounter (Signed)
 Tinnie Dawn made aware of wbc 0.6 anc 0.4

## 2024-08-12 NOTE — Telephone Encounter (Signed)
 Patient aware that she does not need blood tomorrow per secure chat with Tinnie Dawn, NP

## 2024-08-13 ENCOUNTER — Inpatient Hospital Stay

## 2024-08-25 ENCOUNTER — Other Ambulatory Visit: Payer: Self-pay | Admitting: *Deleted

## 2024-08-25 ENCOUNTER — Ambulatory Visit: Admitting: Oncology

## 2024-08-25 ENCOUNTER — Inpatient Hospital Stay: Attending: Oncology

## 2024-08-25 ENCOUNTER — Encounter: Payer: Self-pay | Admitting: Oncology

## 2024-08-25 ENCOUNTER — Ambulatory Visit

## 2024-08-25 VITALS — BP 123/53 | HR 83 | Temp 98.6°F | Resp 19 | Wt 116.5 lb

## 2024-08-25 DIAGNOSIS — M810 Age-related osteoporosis without current pathological fracture: Secondary | ICD-10-CM | POA: Insufficient documentation

## 2024-08-25 DIAGNOSIS — Z5111 Encounter for antineoplastic chemotherapy: Secondary | ICD-10-CM | POA: Diagnosis present

## 2024-08-25 DIAGNOSIS — Z853 Personal history of malignant neoplasm of breast: Secondary | ICD-10-CM | POA: Diagnosis not present

## 2024-08-25 DIAGNOSIS — Z923 Personal history of irradiation: Secondary | ICD-10-CM | POA: Insufficient documentation

## 2024-08-25 DIAGNOSIS — C9200 Acute myeloblastic leukemia, not having achieved remission: Secondary | ICD-10-CM | POA: Insufficient documentation

## 2024-08-25 LAB — CBC WITH DIFFERENTIAL (CANCER CENTER ONLY)
Abs Immature Granulocytes: 0.05 K/uL (ref 0.00–0.07)
Basophils Absolute: 0 K/uL (ref 0.0–0.1)
Basophils Relative: 0 %
Eosinophils Absolute: 0 K/uL (ref 0.0–0.5)
Eosinophils Relative: 0 %
HCT: 25.4 % — ABNORMAL LOW (ref 36.0–46.0)
Hemoglobin: 8.2 g/dL — ABNORMAL LOW (ref 12.0–15.0)
Immature Granulocytes: 9 %
Lymphocytes Relative: 38 %
Lymphs Abs: 0.2 K/uL — ABNORMAL LOW (ref 0.7–4.0)
MCH: 29.5 pg (ref 26.0–34.0)
MCHC: 32.3 g/dL (ref 30.0–36.0)
MCV: 91.4 fL (ref 80.0–100.0)
Monocytes Absolute: 0.2 K/uL (ref 0.1–1.0)
Monocytes Relative: 30 %
Neutro Abs: 0.1 K/uL — CL (ref 1.7–7.7)
Neutrophils Relative %: 23 %
Platelet Count: 35 K/uL — ABNORMAL LOW (ref 150–400)
RBC: 2.78 MIL/uL — ABNORMAL LOW (ref 3.87–5.11)
RDW: 16.3 % — ABNORMAL HIGH (ref 11.5–15.5)
WBC Count: 0.6 K/uL — CL (ref 4.0–10.5)
nRBC: 0 % (ref 0.0–0.2)

## 2024-08-25 LAB — BASIC METABOLIC PANEL - CANCER CENTER ONLY
Anion gap: 10 (ref 5–15)
BUN: 21 mg/dL (ref 8–23)
CO2: 24 mmol/L (ref 22–32)
Calcium: 9.5 mg/dL (ref 8.9–10.3)
Chloride: 105 mmol/L (ref 98–111)
Creatinine: 0.63 mg/dL (ref 0.44–1.00)
GFR, Estimated: >60 mL/min (ref >60)
Glucose, Bld: 121 mg/dL — ABNORMAL HIGH (ref 70–99)
Potassium: 4.5 mmol/L (ref 3.5–5.1)
Sodium: 139 mmol/L (ref 135–145)

## 2024-08-25 LAB — SAMPLE TO BLOOD BANK

## 2024-08-25 LAB — PREPARE RBC (CROSSMATCH)

## 2024-08-25 MED ORDER — AZACITIDINE CHEMO SQ INJECTION
75.0000 mg/m2 | Freq: Once | INTRAMUSCULAR | Status: AC
  Administered 2024-08-25: 115 mg via SUBCUTANEOUS
  Filled 2024-08-25: qty 4.6

## 2024-08-25 MED ORDER — ONDANSETRON HCL 8 MG PO TABS
8.0000 mg | ORAL_TABLET | Freq: Once | ORAL | Status: AC
  Administered 2024-08-25: 8 mg via ORAL
  Filled 2024-08-25: qty 1

## 2024-08-25 NOTE — Progress Notes (Signed)
 [facility]  Telephone:(336) [phone] Fax:(336) [phone]  ID: Lauraine DELENA Halsted OB: 1940-10-11  MR#: [id number]  RDW#:[id number]  Patient Care Team: Edman Marsa PARAS, DO as PCP - General (Family Medicine) Damian, Therisa HERO, MD as Physician Assistant (Endocrinology) Jacobo Evalene PARAS, MD as Consulting Physician (Oncology)  CHIEF COMPLAINT: AML   INTERVAL HISTORY: Patient returns to clinic today for further evaluation and consideration of cycle 8, day 1 of Vidaza  and venetoclax.  She had a bone marrow biopsy on August 18, 2024 at [facility] which appears to be unchanged from previous with approximately 5% blast cells.  She continues to have chronic fatigue, but otherwise feels well.  She is tolerating her treatments without significant side effects.  She has no neurologic complaints.  She denies any recent fevers or illnesses.  She has a good appetite and denies weight loss.  She has no chest pain, shortness of breath, cough, or hemoptysis.  She denies any nausea, vomiting, constipation, or diarrhea.  She has no urinary complaints.  Patient offers no further specific complaints today.  REVIEW OF SYSTEMS:   Review of Systems  Constitutional: Negative.  Negative for fever, malaise/fatigue and weight loss.  Respiratory: Negative.  Negative for cough, hemoptysis and shortness of breath.   Cardiovascular: Negative.  Negative for chest pain and leg swelling.  Gastrointestinal: Negative.  Negative for abdominal pain.  Genitourinary: Negative.  Negative for dysuria.  Musculoskeletal: Negative.  Negative for back pain.  Skin: Negative.  Negative for rash.  Neurological: Negative.  Negative for dizziness, focal weakness, weakness and headaches.  Psychiatric/Behavioral: Negative.  The patient is not nervous/anxious.     As per HPI. Otherwise, a complete review of systems is negative.  PAST MEDICAL HISTORY: Past Medical History:  Diagnosis Date   Anemia    Breast cancer (HCC) 09/2014    left breast lumpectomy with rad tx   Hyperlipidemia    Malignant neoplasm of lower-inner quadrant of left female breast (HCC) 07/2014   5 mm,T1a, N0; ER/ PR positive, HER-2/neu negative   Osteoporosis    Personal history of radiation therapy 2016   left breast ca    PAST SURGICAL HISTORY: Past Surgical History:  Procedure Laterality Date   BREAST BIOPSY Left 08/2014   invasive mammary carcinoma   BREAST LUMPECTOMY Left 10/01/2014   invasive mammary carcinoma and DCIS with clear margins   BREAST SURGERY Left 10/01/2014   lumpectomy   COLONOSCOPY  2010   COLONOSCOPY WITH PROPOFOL  N/A 09/21/2015   Procedure: COLONOSCOPY WITH PROPOFOL ;  Surgeon: Louanne KANDICE Muse, MD;  Location: [facility] ENDOSCOPY;  Service: Endoscopy;  Laterality: N/A;   IR BONE MARROW BIOPSY & ASPIRATION  11/19/2023   UPPER GI ENDOSCOPY  2014    FAMILY HISTORY: Family History  Problem Relation Age of Onset   Breast cancer Sister 1   Heart disease Mother    Heart disease Father     ADVANCED DIRECTIVES (Y/N):  N  HEALTH MAINTENANCE: Social History   Tobacco Use   Smoking status: Never   Smokeless tobacco: Never  Vaping Use   Vaping status: Never Used  Substance Use Topics   Alcohol use: No    Alcohol/week: 0.0 standard drinks of alcohol   Drug use: No     Colonoscopy:  PAP:  Bone density:  Lipid panel:  No Known Allergies  Current Outpatient Medications  Medication Sig Dispense Refill   amLODipine  (NORVASC ) 5 MG tablet TAKE 1 TABLET BY MOUTH DAILY 100 tablet 3  Ascorbic Acid (VITAMIN C) 1000 MG tablet Take 1,000 mg by mouth daily.     calcium carbonate 100 mg/ml SUSP Take by mouth.     cefdinir (OMNICEF) 300 MG capsule Take 300 mg by mouth.     Cholecalciferol (VITAMIN D3) 10 MCG (400 UNIT) CAPS Take by mouth.     co-enzyme Q-10 30 MG capsule Take 30 mg by mouth 3 (three) times daily.     fluticasone  (FLONASE ) 50 MCG/ACT nasal spray 1 spray.     hydrochlorothiazide (HYDRODIURIL) 25 MG  tablet Take 25 mg by mouth.     Lutein-Zeaxanthin 15-0.7 MG CAPS Take 1 capsule by mouth daily.     metoprolol  tartrate (LOPRESSOR ) 25 MG tablet Take 1 tablet (25 mg total) by mouth 2 (two) times daily. 180 tablet 3   Omega-3 Fatty Acids (FISH OIL PO) Take 1 capsule by mouth daily.     ondansetron  (ZOFRAN ) 4 MG tablet Take 4 mg by mouth.     pravastatin  (PRAVACHOL ) 20 MG tablet Take 1 tablet (20 mg total) by mouth daily. 90 tablet 3   Saccharomyces boulardii (PROBIOTIC) 250 MG CAPS Take 250 mg by mouth daily.     valACYclovir (VALTREX) 500 MG tablet Take 500 mg by mouth daily.     venetoclax (VENCLEXTA) 100 MG tablet Take 1 tablet (100 mg total) by mouth once daily for days 1-14 of a 28 day cycle. Take with a meal and water. Do not chew, crush, or break tablets.     voriconazole (VFEND) 200 MG tablet Take 200 mg by mouth 2 (two) times daily.     FERROUS SULFATE PO Take 1 tablet by mouth once a week. (Patient not taking: Reported on 08/25/2024)     No current facility-administered medications for this visit.    OBJECTIVE: Vitals:   08/25/24 0943  BP: (!) 123/53  Pulse: 83  Resp: 19  Temp: 98.6 F (37 C)  SpO2: 100%     Body mass index is 20.38 kg/m.    ECOG FS:0 - Asymptomatic  General: Well-developed, well-nourished, no acute distress. Eyes: Pink conjunctiva, anicteric sclera. HEENT: Normocephalic, moist mucous membranes. Lungs: No audible wheezing or coughing. Heart: Regular rate and rhythm. Abdomen: Soft, nontender, no obvious distention. Musculoskeletal: No edema, cyanosis, or clubbing. Neuro: Alert, answering all questions appropriately. Cranial nerves grossly intact. Skin: No rashes or petechiae noted. Psych: Normal affect.  LAB RESULTS:  Lab Results  Component Value Date   NA 139 08/12/2024   K 3.9 08/12/2024   CL 106 08/12/2024   CO2 25 08/12/2024   GLUCOSE 133 (H) 08/12/2024   BUN 21 08/12/2024   CREATININE 0.56 08/12/2024   CALCIUM 9.2 08/12/2024   PROT 7.1  08/04/2024   ALBUMIN 3.9 08/04/2024   AST 16 08/04/2024   ALT 13 08/04/2024   ALKPHOS 59 08/04/2024   BILITOT 1.0 08/04/2024   GFRNONAA >60 08/12/2024   GFRAA 98 05/10/2020    Lab Results  Component Value Date   WBC 0.6 (LL) 08/12/2024   NEUTROABS 0.4 (LL) 08/12/2024   HGB 8.5 (L) 08/12/2024   HCT 25.7 (L) 08/12/2024   MCV 90.2 08/12/2024   PLT 25 (L) 08/12/2024     STUDIES: No results found.  ASSESSMENT: AML.  PLAN:    AML: Patient completed her first 4 cycles of Vidaza  and venetoclax at [facility].  Her most recent bone marrow biopsy on May 14, 2024 reported a hypercellular bone marrow (90%) with marked fibrosis and erythroid predominant hematopoiesis and  4% blasts.  Repeat bone marrow biopsy on August 18, 2024 was essentially unchanged with a hypercellular bone marrow (90%) with marked fibrosis and trilineage hematopoiesis and ~5% CD34-positive blasts.  Her original bone marrow biopsy on November 19, 2023 reported 8% blasts.  Recently, the leukemia team at [facility] recommended increasing Vidaza  treatments from 5 to 7 days.  Despite this, given her persistent pancytopenia and other significant side effects, patient has not received 7 days of Vidaza .  Continue 100 mg venetoclax on days 1 through 21.  Proceed with cycle 8, day 1 of treatment today.  Return to clinic daily for 5 days and then in 1 week for further evaluation and consideration of cycle 8, day 6 of treatment.  Patient also received 1 unit of packed red blood cells later this week.    Anemia: Hemoglobin stable at 8.2.  1 unit of blood later this week.  All blood products must be irradiated.  Patient's transfusion threshold is less than 8.0. Thrombocytopenia: Platelets mildly improved to 35.  Monitor.  Neutropenia: ANC 0.1 today. Prophylaxis: Continue Valtrex, cefdinir, and posaconazole as prescribed. Blood blister/local irritation: Improved.  Continue symptomatic treatment as instructed.  Patient expressed understanding and was in  agreement with this plan. She also understands that She can call clinic at any time with any questions, concerns, or complaints.    Cancer Staging  AML (acute myelogenous leukemia) (HCC) Staging form: Acute Myeloid Leukemia, AJCC 8th Edition - Clinical stage from 05/29/2024: Zubrod performance status: 0, Cytogenetics (20 metaphase): Unknown, NPM1-, CEBPA-, FLT3- - Signed by Jacobo Evalene PARAS, MD on 05/29/2024 Stage prefix: Initial diagnosis   Evalene PARAS Jacobo, MD   08/25/2024 10:00 AM

## 2024-08-26 ENCOUNTER — Encounter: Payer: Self-pay | Admitting: Oncology

## 2024-08-26 ENCOUNTER — Other Ambulatory Visit: Payer: Self-pay | Admitting: *Deleted

## 2024-08-26 ENCOUNTER — Inpatient Hospital Stay

## 2024-08-26 VITALS — BP 123/62 | HR 80 | Temp 97.5°F | Resp 18

## 2024-08-26 DIAGNOSIS — C9200 Acute myeloblastic leukemia, not having achieved remission: Secondary | ICD-10-CM

## 2024-08-26 DIAGNOSIS — Z5111 Encounter for antineoplastic chemotherapy: Secondary | ICD-10-CM | POA: Diagnosis not present

## 2024-08-26 MED ORDER — AZACITIDINE CHEMO SQ INJECTION
75.0000 mg/m2 | Freq: Once | INTRAMUSCULAR | Status: AC
  Administered 2024-08-26: 115 mg via SUBCUTANEOUS
  Filled 2024-08-26: qty 4.6

## 2024-08-26 MED ORDER — ONDANSETRON HCL 8 MG PO TABS
8.0000 mg | ORAL_TABLET | Freq: Once | ORAL | Status: AC
  Administered 2024-08-26: 8 mg via ORAL
  Filled 2024-08-26: qty 1

## 2024-08-26 MED ORDER — ONDANSETRON HCL 8 MG PO TABS: 8.0000 mg | ORAL_TABLET | Freq: Three times a day (TID) | ORAL | 2 refills | Status: AC | PRN

## 2024-08-26 NOTE — Patient Instructions (Signed)
 [facility] - A DEPT OF . [facility]  Discharge Instructions: Thank you for choosing [facility] to provide your oncology and hematology care.  If you have a lab appointment with the Cancer Center, please go directly to the Cancer Center and check in at the registration area.  Wear comfortable clothing and clothing appropriate for easy access to any Portacath or PICC line.   We strive to give you quality time with your provider. You may need to reschedule your appointment if you arrive late (15 or more minutes).  Arriving late affects you and other patients whose appointments are after yours.  Also, if you miss three or more appointments without notifying the office, you may be dismissed from the clinic at the provider's discretion.      For prescription refill requests, have your pharmacy contact our office and allow 72 hours for refills to be completed.    Today you received the following chemotherapy and/or immunotherapy agents VIDAZA        To help prevent nausea and vomiting after your treatment, we encourage you to take your nausea medication as directed.  BELOW ARE SYMPTOMS THAT SHOULD BE REPORTED IMMEDIATELY: *FEVER GREATER THAN 100.4 F (38 C) OR HIGHER *CHILLS OR SWEATING *NAUSEA AND VOMITING THAT IS NOT CONTROLLED WITH YOUR NAUSEA MEDICATION *UNUSUAL SHORTNESS OF BREATH *UNUSUAL BRUISING OR BLEEDING *URINARY PROBLEMS (pain or burning when urinating, or frequent urination) *BOWEL PROBLEMS (unusual diarrhea, constipation, pain near the anus) TENDERNESS IN MOUTH AND THROAT WITH OR WITHOUT PRESENCE OF ULCERS (sore throat, sores in mouth, or a toothache) UNUSUAL RASH, SWELLING OR PAIN  UNUSUAL VAGINAL DISCHARGE OR ITCHING   Items with * indicate a potential emergency and should be followed up as soon as possible or go to the Emergency Department if any problems should occur.  Please show the CHEMOTHERAPY ALERT CARD or IMMUNOTHERAPY  ALERT CARD at check-in to the Emergency Department and triage nurse.  Should you have questions after your visit or need to cancel or reschedule your appointment, please contact [facility] - A DEPT OF [facility]  [phone] and follow the prompts.  Office hours are 8:00 a.m. to 4:30 p.m. Monday - Friday. Please note that voicemails left after 4:00 p.m. may not be returned until the following business day.  We are closed weekends and major holidays. You have access to a nurse at all times for urgent questions. Please call the main number to the clinic [phone] and follow the prompts.  For any non-urgent questions, you may also contact your provider using MyChart. We now offer e-Visits for anyone 77 and older to request care online for non-urgent symptoms. For details visit [email or web address].   Also download the MyChart app! Go to the app store, search MyChart, open the app, select [facility], and log in with your MyChart username and password.  Azacitidine  Injection What is this medication? AZACITIDINE  (ay [employer] i deen) treats blood and bone marrow cancers. It works by slowing down the growth of cancer cells. This medicine may be used for other purposes; ask your health care provider or pharmacist if you have questions. COMMON BRAND NAME(S): Vidaza  What should I tell my care team before I take this medication? They need to know if you have any of these conditions: Kidney disease Liver disease Low blood cell levels, such as low white cells, platelets, or red blood cells Low levels of albumin in  the blood Low levels of bicarbonate in the blood An unusual or allergic reaction to azacitidine , mannitol, other medications, foods, dyes, or preservatives If you or your partner are pregnant or trying to get pregnant Breast-feeding How should I use this medication? This medication is injected into a vein or under the skin. It is given by your care  team in a hospital or clinic setting. Talk to your care team about the use of this medication in children. While it may be prescribed for children as young as 1 month for selected conditions, precautions do apply. Overdosage: If you think you have taken too much of this medicine contact a poison control center or emergency room at once. NOTE: This medicine is only for you. Do not share this medicine with others. What if I miss a dose? Keep appointments for follow-up doses. It is important not to miss your dose. Call your care team if you are unable to keep an appointment. What may interact with this medication? Interactions are not expected. This list may not describe all possible interactions. Give your health care provider a list of all the medicines, herbs, non-prescription drugs, or dietary supplements you use. Also tell them if you smoke, drink alcohol, or use illegal drugs. Some items may interact with your medicine. What should I watch for while using this medication? Your condition will be monitored carefully while you are receiving this medication. This medication may make you feel generally unwell. This is not uncommon as chemotherapy can affect healthy cells as well as cancer cells. Report any side effects. Continue your course of treatment even though you feel ill unless your care team tells you to stop. You may need blood work done while you are taking this medication. Other product types may be available that contain the medication azacitidine . The injection and oral products should not be used in place of one another. Talk to your care team if you have questions. This medication can cause serious side effects. To reduce the risk, your care team may give you other medications to take before receiving this one. Be sure to follow the directions from your care team. This medication may increase your risk of getting an infection. Call your care team for advice if you get a fever, chills, sore  throat, or other symptoms of a cold or flu. Do not treat yourself. Try to avoid being around people who are sick. Avoid taking medications that contain aspirin, acetaminophen , ibuprofen , naproxen, or ketoprofen unless instructed by your care team. These medications may hide a fever. Be careful brushing or flossing your teeth or using a toothpick because you may get an infection or bleed more easily. If you have any dental work done, tell your dentist you are receiving this medication. Talk to your care team if you or your partner may be pregnant. Serious birth defects can occur if you take this medication during pregnancy and for 6 months after the last dose. You will need a negative pregnancy test before starting this medication. Contraception is recommended while taking this medication and for 6 months after the last dose. Your care team can help you find the option that works for you. If your partner can get pregnant, use a condom during sex while taking this medication and for 3 months after the last dose. Do not breastfeed while taking this medication and for 1 week after the last dose. This medication may cause infertility. Talk to your care team if you are concerned about your fertility.  What side effects may I notice from receiving this medication? Side effects that you should report to your care team as soon as possible: Allergic reactions--skin rash, itching, hives, swelling of the face, lips, tongue, or throat Infection--fever, chills, cough, sore throat, wounds that don't heal, pain or trouble when passing urine, general feeling of discomfort or being unwell Kidney injury--decrease in the amount of urine, swelling of the ankles, hands, or feet Liver injury--right upper belly pain, loss of appetite, nausea, light-colored stool, dark yellow or brown urine, yellowing skin or eyes, unusual weakness or fatigue Low red blood cell level--unusual weakness or fatigue, dizziness, headache, trouble  breathing Tumor lysis syndrome (TLS)--nausea, vomiting, diarrhea, decrease in the amount of urine, dark urine, unusual weakness or fatigue, confusion, muscle pain or cramps, fast or irregular heartbeat, joint pain Unusual bruising or bleeding Side effects that usually do not require medical attention (report to your care team if they continue or are bothersome): Constipation Diarrhea Nausea Pain, redness, or irritation at injection site Vomiting This list may not describe all possible side effects. Call your doctor for medical advice about side effects. You may report side effects to FDA at 1-800-FDA-1088. Where should I keep my medication? This medication is given in a hospital or clinic. It will not be stored at home. NOTE: This sheet is a summary. It may not cover all possible information. If you have questions about this medicine, talk to your doctor, pharmacist, or health care provider.  2024 Elsevier/Gold Standard (2023-05-07 00:00:00)

## 2024-08-27 ENCOUNTER — Other Ambulatory Visit: Payer: Self-pay | Admitting: Oncology

## 2024-08-27 ENCOUNTER — Inpatient Hospital Stay

## 2024-08-27 ENCOUNTER — Encounter: Payer: Self-pay | Admitting: Oncology

## 2024-08-27 VITALS — BP 105/52 | HR 87 | Temp 98.5°F | Resp 19

## 2024-08-27 DIAGNOSIS — Z5111 Encounter for antineoplastic chemotherapy: Secondary | ICD-10-CM | POA: Diagnosis not present

## 2024-08-27 DIAGNOSIS — C9200 Acute myeloblastic leukemia, not having achieved remission: Secondary | ICD-10-CM

## 2024-08-27 MED ORDER — ACETAMINOPHEN 325 MG PO TABS
650.0000 mg | ORAL_TABLET | Freq: Once | ORAL | Status: AC
  Administered 2024-08-27: 650 mg via ORAL
  Filled 2024-08-27: qty 2

## 2024-08-27 MED ORDER — AZACITIDINE CHEMO SQ INJECTION
75.0000 mg/m2 | Freq: Once | INTRAMUSCULAR | Status: AC
  Administered 2024-08-27: 115 mg via SUBCUTANEOUS
  Filled 2024-08-27: qty 4.6

## 2024-08-27 MED ORDER — DIPHENHYDRAMINE HCL 50 MG/ML IJ SOLN
25.0000 mg | Freq: Once | INTRAMUSCULAR | Status: AC
  Administered 2024-08-27: 25 mg via INTRAVENOUS
  Filled 2024-08-27: qty 1

## 2024-08-27 MED ORDER — ONDANSETRON HCL 8 MG PO TABS
8.0000 mg | ORAL_TABLET | Freq: Once | ORAL | Status: DC
  Filled 2024-08-27: qty 1

## 2024-08-27 MED ORDER — SODIUM CHLORIDE 0.9% IV SOLUTION
250.0000 mL | INTRAVENOUS | Status: DC
  Administered 2024-08-27: 100 mL via INTRAVENOUS
  Filled 2024-08-27: qty 250

## 2024-08-28 ENCOUNTER — Inpatient Hospital Stay

## 2024-08-28 VITALS — BP 118/56 | HR 75 | Temp 97.7°F | Resp 18

## 2024-08-28 DIAGNOSIS — C9200 Acute myeloblastic leukemia, not having achieved remission: Secondary | ICD-10-CM

## 2024-08-28 DIAGNOSIS — Z5111 Encounter for antineoplastic chemotherapy: Secondary | ICD-10-CM | POA: Diagnosis not present

## 2024-08-28 LAB — TYPE AND SCREEN
ABO/RH(D): O POS
Antibody Screen: NEGATIVE
Unit division: 0

## 2024-08-28 LAB — BPAM RBC
Blood Product Expiration Date: 202512242359
ISSUE DATE / TIME: 202512101245
Unit Type and Rh: 202512242359
Unit Type and Rh: 5100

## 2024-08-28 MED ORDER — AZACITIDINE CHEMO SQ INJECTION
75.0000 mg/m2 | Freq: Once | INTRAMUSCULAR | Status: AC
  Administered 2024-08-28: 115 mg via SUBCUTANEOUS
  Filled 2024-08-28: qty 4.6

## 2024-08-28 MED ORDER — ONDANSETRON HCL 8 MG PO TABS: 8.0000 mg | ORAL_TABLET | Freq: Once | ORAL | Status: DC

## 2024-08-28 NOTE — Progress Notes (Signed)
 [facility] Refill Coordination Note    Specialty Medication(s) to be Shipped:   Hematology/Oncology: Venclexta     Other medication(s) to be shipped: No additional medications requested for fill at this time    Specialty Medications not needed at this time: N/A     Rebecca Mitchell, DOB: 05-12-1941  Phone: [phone] (home)       All above HIPAA information was verified with patient.     Was a nurse, learning disability used for this call? No    Completed refill call assessment today to schedule patient's medication shipment from the [facility]  [phone]).  All relevant notes have been reviewed.     Specialty medication(s) and dose(s) confirmed: Regimen is correct and unchanged.   Changes to medications: Brendy reports no changes at this time.  Changes to insurance: No  New side effects reported not previously addressed with a pharmacist or physician: None reported  Questions for the pharmacist: No    Confirmed patient received a Conservation Officer, Historic Buildings and a Surveyor, Mining with first shipment. The patient will receive a drug information handout for each medication shipped and additional FDA Medication Guides as required.       DISEASE/MEDICATION-SPECIFIC INFORMATION        For patients on oral chemotherapy: Next cycle is scheduled to start 08/25/24.    SPECIALTY MEDICATION ADHERENCE     Medication Adherence    Patient reported X missed doses in the last month: 0  Specialty Medication: Venclexta  100mg   Patient is on additional specialty medications: No  Informant: patient     Were doses missed due to medication being on hold? No    Venclexta  100 mg: 9 days of medicine on hand       REFERRAL TO PHARMACIST     Referral to the pharmacist: Not needed      [facility]     Shipping address confirmed in Epic.     Cost and Payment: Patient has a $0 copay, payment information is not required.    Delivery Scheduled: Yes, Expected medication delivery date: 09/02/24.     Medication will be delivered via Next Day Courier to the prescription address in Epic [place].    Rebecca Mitchell   [facility]  Specialty Technician

## 2024-08-28 NOTE — Patient Instructions (Signed)
 [facility] - A DEPT OF . [facility]  Discharge Instructions: Thank you for choosing [facility] to provide your oncology and hematology care.  If you have a lab appointment with the Cancer Center, please go directly to the Cancer Center and check in at the registration area.  Wear comfortable clothing and clothing appropriate for easy access to any Portacath or PICC line.   We strive to give you quality time with your provider. You may need to reschedule your appointment if you arrive late (15 or more minutes).  Arriving late affects you and other patients whose appointments are after yours.  Also, if you miss three or more appointments without notifying the office, you may be dismissed from the clinic at the provider's discretion.      For prescription refill requests, have your pharmacy contact our office and allow 72 hours for refills to be completed.    Today you received the following chemotherapy and/or immunotherapy agents VIDAZA        To help prevent nausea and vomiting after your treatment, we encourage you to take your nausea medication as directed.  BELOW ARE SYMPTOMS THAT SHOULD BE REPORTED IMMEDIATELY: *FEVER GREATER THAN 100.4 F (38 C) OR HIGHER *CHILLS OR SWEATING *NAUSEA AND VOMITING THAT IS NOT CONTROLLED WITH YOUR NAUSEA MEDICATION *UNUSUAL SHORTNESS OF BREATH *UNUSUAL BRUISING OR BLEEDING *URINARY PROBLEMS (pain or burning when urinating, or frequent urination) *BOWEL PROBLEMS (unusual diarrhea, constipation, pain near the anus) TENDERNESS IN MOUTH AND THROAT WITH OR WITHOUT PRESENCE OF ULCERS (sore throat, sores in mouth, or a toothache) UNUSUAL RASH, SWELLING OR PAIN  UNUSUAL VAGINAL DISCHARGE OR ITCHING   Items with * indicate a potential emergency and should be followed up as soon as possible or go to the Emergency Department if any problems should occur.  Please show the CHEMOTHERAPY ALERT CARD or IMMUNOTHERAPY  ALERT CARD at check-in to the Emergency Department and triage nurse.  Should you have questions after your visit or need to cancel or reschedule your appointment, please contact [facility] - A DEPT OF [facility]  [phone] and follow the prompts.  Office hours are 8:00 a.m. to 4:30 p.m. Monday - Friday. Please note that voicemails left after 4:00 p.m. may not be returned until the following business day.  We are closed weekends and major holidays. You have access to a nurse at all times for urgent questions. Please call the main number to the clinic [phone] and follow the prompts.  For any non-urgent questions, you may also contact your provider using MyChart. We now offer e-Visits for anyone 77 and older to request care online for non-urgent symptoms. For details visit [email or web address].   Also download the MyChart app! Go to the app store, search MyChart, open the app, select [facility], and log in with your MyChart username and password.  Azacitidine  Injection What is this medication? AZACITIDINE  (ay [employer] i deen) treats blood and bone marrow cancers. It works by slowing down the growth of cancer cells. This medicine may be used for other purposes; ask your health care provider or pharmacist if you have questions. COMMON BRAND NAME(S): Vidaza  What should I tell my care team before I take this medication? They need to know if you have any of these conditions: Kidney disease Liver disease Low blood cell levels, such as low white cells, platelets, or red blood cells Low levels of albumin in  the blood Low levels of bicarbonate in the blood An unusual or allergic reaction to azacitidine , mannitol, other medications, foods, dyes, or preservatives If you or your partner are pregnant or trying to get pregnant Breast-feeding How should I use this medication? This medication is injected into a vein or under the skin. It is given by your care  team in a hospital or clinic setting. Talk to your care team about the use of this medication in children. While it may be prescribed for children as young as 1 month for selected conditions, precautions do apply. Overdosage: If you think you have taken too much of this medicine contact a poison control center or emergency room at once. NOTE: This medicine is only for you. Do not share this medicine with others. What if I miss a dose? Keep appointments for follow-up doses. It is important not to miss your dose. Call your care team if you are unable to keep an appointment. What may interact with this medication? Interactions are not expected. This list may not describe all possible interactions. Give your health care provider a list of all the medicines, herbs, non-prescription drugs, or dietary supplements you use. Also tell them if you smoke, drink alcohol, or use illegal drugs. Some items may interact with your medicine. What should I watch for while using this medication? Your condition will be monitored carefully while you are receiving this medication. This medication may make you feel generally unwell. This is not uncommon as chemotherapy can affect healthy cells as well as cancer cells. Report any side effects. Continue your course of treatment even though you feel ill unless your care team tells you to stop. You may need blood work done while you are taking this medication. Other product types may be available that contain the medication azacitidine . The injection and oral products should not be used in place of one another. Talk to your care team if you have questions. This medication can cause serious side effects. To reduce the risk, your care team may give you other medications to take before receiving this one. Be sure to follow the directions from your care team. This medication may increase your risk of getting an infection. Call your care team for advice if you get a fever, chills, sore  throat, or other symptoms of a cold or flu. Do not treat yourself. Try to avoid being around people who are sick. Avoid taking medications that contain aspirin, acetaminophen , ibuprofen , naproxen, or ketoprofen unless instructed by your care team. These medications may hide a fever. Be careful brushing or flossing your teeth or using a toothpick because you may get an infection or bleed more easily. If you have any dental work done, tell your dentist you are receiving this medication. Talk to your care team if you or your partner may be pregnant. Serious birth defects can occur if you take this medication during pregnancy and for 6 months after the last dose. You will need a negative pregnancy test before starting this medication. Contraception is recommended while taking this medication and for 6 months after the last dose. Your care team can help you find the option that works for you. If your partner can get pregnant, use a condom during sex while taking this medication and for 3 months after the last dose. Do not breastfeed while taking this medication and for 1 week after the last dose. This medication may cause infertility. Talk to your care team if you are concerned about your fertility.  What side effects may I notice from receiving this medication? Side effects that you should report to your care team as soon as possible: Allergic reactions--skin rash, itching, hives, swelling of the face, lips, tongue, or throat Infection--fever, chills, cough, sore throat, wounds that don't heal, pain or trouble when passing urine, general feeling of discomfort or being unwell Kidney injury--decrease in the amount of urine, swelling of the ankles, hands, or feet Liver injury--right upper belly pain, loss of appetite, nausea, light-colored stool, dark yellow or brown urine, yellowing skin or eyes, unusual weakness or fatigue Low red blood cell level--unusual weakness or fatigue, dizziness, headache, trouble  breathing Tumor lysis syndrome (TLS)--nausea, vomiting, diarrhea, decrease in the amount of urine, dark urine, unusual weakness or fatigue, confusion, muscle pain or cramps, fast or irregular heartbeat, joint pain Unusual bruising or bleeding Side effects that usually do not require medical attention (report to your care team if they continue or are bothersome): Constipation Diarrhea Nausea Pain, redness, or irritation at injection site Vomiting This list may not describe all possible side effects. Call your doctor for medical advice about side effects. You may report side effects to FDA at 1-800-FDA-1088. Where should I keep my medication? This medication is given in a hospital or clinic. It will not be stored at home. NOTE: This sheet is a summary. It may not cover all possible information. If you have questions about this medicine, talk to your doctor, pharmacist, or health care provider.  2024 Elsevier/Gold Standard (2023-05-07 00:00:00)

## 2024-08-29 ENCOUNTER — Other Ambulatory Visit: Payer: Self-pay | Admitting: Oncology

## 2024-08-29 ENCOUNTER — Inpatient Hospital Stay: Admitting: Nurse Practitioner

## 2024-08-29 ENCOUNTER — Inpatient Hospital Stay

## 2024-08-29 VITALS — BP 115/60 | HR 76 | Temp 97.4°F | Resp 16

## 2024-08-29 DIAGNOSIS — C9200 Acute myeloblastic leukemia, not having achieved remission: Secondary | ICD-10-CM

## 2024-08-29 DIAGNOSIS — Z5111 Encounter for antineoplastic chemotherapy: Secondary | ICD-10-CM | POA: Diagnosis not present

## 2024-08-29 DIAGNOSIS — T148XXA Other injury of unspecified body region, initial encounter: Secondary | ICD-10-CM

## 2024-08-29 MED ORDER — AZACITIDINE CHEMO SQ INJECTION
75.0000 mg/m2 | Freq: Once | INTRAMUSCULAR | Status: AC
  Administered 2024-08-29: 115 mg via SUBCUTANEOUS
  Filled 2024-08-29: qty 4.6

## 2024-08-29 MED ORDER — ONDANSETRON HCL 8 MG PO TABS: 8.0000 mg | ORAL_TABLET | Freq: Once | ORAL | Status: DC

## 2024-08-29 NOTE — Progress Notes (Signed)
 Symptom Management Clinic  [facility] Department of the [place] [address] [place], [place] [address] [phone] (phone) [phone] (fax)  Patient Care Team: Edman Marsa PARAS, DO as PCP - General (Family Medicine) Damian, Therisa HERO, MD as Physician Assistant (Endocrinology) Jacobo Evalene PARAS, MD as Consulting Physician (Oncology)   Name of the patient: Rebecca Mitchell  [id number]  1941/01/04   Date of visit: 08/29/2024  Diagnosis- AML  Chief complaint/ Reason for visit- blood blister  Heme/Onc history:  Oncology History  AML (acute myelogenous leukemia) (HCC)  05/29/2024 Initial Diagnosis   AML (acute myelogenous leukemia) (HCC)   05/29/2024 Cancer Staging   Staging form: Acute Myeloid Leukemia, AJCC 8th Edition - Clinical stage from 05/29/2024: Zubrod performance status: 0, Cytogenetics (20 metaphase): Unknown, NPM1-, CEBPA-, FLT3- - Signed by Jacobo Evalene PARAS, MD on 05/29/2024 Stage prefix: Initial diagnosis   06/02/2024 -  Chemotherapy   Patient is on Treatment Plan : AML Azacitidine  SQ D1-5 q28d       Interval history- Rebecca Mitchell is a 83 y.o. female currently receiving 6 for AML.  She was scheduled for infusion today requested evaluation of a bleeding blister on her abdomen.  She says that it was oozing blood last night for approximately 2 hours.  She has had a problem with site reactions in the has not had this much bruising/bleeding.  No gum bleeding or other bleeding reported.  Review of systems- Review of Systems  HENT:  Negative for nosebleeds.   Respiratory:  Negative for hemoptysis.   Gastrointestinal:  Negative for melena.  Genitourinary:  Negative for hematuria.  Endo/Heme/Allergies:  Bruises/bleeds easily.    No Known Allergies  Past Medical History:  Diagnosis Date   Anemia    Breast cancer (HCC) 09/2014   left breast lumpectomy with rad tx   Hyperlipidemia    Malignant  neoplasm of lower-inner quadrant of left female breast (HCC) 07/2014   5 mm,T1a, N0; ER/ PR positive, HER-2/neu negative   Osteoporosis    Personal history of radiation therapy 2016   left breast ca    Past Surgical History:  Procedure Laterality Date   BREAST BIOPSY Left 08/2014   invasive mammary carcinoma   BREAST LUMPECTOMY Left 10/01/2014   invasive mammary carcinoma and DCIS with clear margins   BREAST SURGERY Left 10/01/2014   lumpectomy   COLONOSCOPY  2010   COLONOSCOPY WITH PROPOFOL  N/A 09/21/2015   Procedure: COLONOSCOPY WITH PROPOFOL ;  Surgeon: Louanne KANDICE Muse, MD;  Location: [facility] ENDOSCOPY;  Service: Endoscopy;  Laterality: N/A;   IR BONE MARROW BIOPSY & ASPIRATION  11/19/2023   UPPER GI ENDOSCOPY  2014    Physical exam: There were no vitals filed for this visit. Physical Exam Skin:    Comments: 3 cm x 2.5 cm flat lesion on right lower abdomen. Bandage removed. Minimal spotting visualized. Irrigated with saline and re-dressed with 4x4 and film dressing. No evidence of infection  Neurological:     Mental Status: She is alert.        Latest Ref Rng & Units 08/25/2024    9:24 AM  CBC  WBC 4.0 - 10.5 K/uL 0.6   Hemoglobin 12.0 - 15.0 g/dL 8.2   Hematocrit 63.9 - 46.0 % 25.4   Platelets 150 - 400 K/uL 35    Assessment and plan- Patient is a 83 y.o. female diagnosed with AML currently on 5 days of venetoclax who presents  to symptom management clinic for local site reaction.  Discussed that this is likely secondary to azacitidine  rejection.  She has underlying thrombocytopenia which increases her risk of blood blisters/easy bleeding.  She can follow-up with medical oncology for management of her underlying disease.  Today, area has stopped bleeding.  We discussed care if future bleeding. ER precautions provided. Follow up with med-onc as planned.    Visit Diagnosis 1. Blood blister    Patient expressed understanding and was in agreement with this plan. She also  understands that She can call clinic at any time with any questions, concerns, or complaints.   Thank you for allowing me to participate in the care of this very pleasant patient.   Tinnie Dawn, DNP, AGNP-C, AOCNP [facility] [phone]

## 2024-08-29 NOTE — Progress Notes (Signed)
 Patient in clinic for C4D5 of [place] . Husband with patient, reports bleeding at injection site (right abdomen) given Monday. Bleeding occurred Thursday night, reports site bled for 2 hours. Seen by Tinnie Dawn, NP to evaluate site, as dressing was saturated, concern for additional bleeding. Ok to treat, site clear of bleeding and re-bandaged.

## 2024-08-29 NOTE — Patient Instructions (Signed)
 [facility] - A DEPT OF . [facility]  Discharge Instructions: Thank you for choosing [facility] to provide your oncology and hematology care.  If you have a lab appointment with the Cancer Center, please go directly to the Cancer Center and check in at the registration area.  Wear comfortable clothing and clothing appropriate for easy access to any Portacath or PICC line.   We strive to give you quality time with your provider. You may need to reschedule your appointment if you arrive late (15 or more minutes).  Arriving late affects you and other patients whose appointments are after yours.  Also, if you miss three or more appointments without notifying the office, you may be dismissed from the clinic at the provider's discretion.      For prescription refill requests, have your pharmacy contact our office and allow 72 hours for refills to be completed.    Today you received the following chemotherapy and/or immunotherapy agents VIDAZA        To help prevent nausea and vomiting after your treatment, we encourage you to take your nausea medication as directed.  BELOW ARE SYMPTOMS THAT SHOULD BE REPORTED IMMEDIATELY: *FEVER GREATER THAN 100.4 F (38 C) OR HIGHER *CHILLS OR SWEATING *NAUSEA AND VOMITING THAT IS NOT CONTROLLED WITH YOUR NAUSEA MEDICATION *UNUSUAL SHORTNESS OF BREATH *UNUSUAL BRUISING OR BLEEDING *URINARY PROBLEMS (pain or burning when urinating, or frequent urination) *BOWEL PROBLEMS (unusual diarrhea, constipation, pain near the anus) TENDERNESS IN MOUTH AND THROAT WITH OR WITHOUT PRESENCE OF ULCERS (sore throat, sores in mouth, or a toothache) UNUSUAL RASH, SWELLING OR PAIN  UNUSUAL VAGINAL DISCHARGE OR ITCHING   Items with * indicate a potential emergency and should be followed up as soon as possible or go to the Emergency Department if any problems should occur.  Please show the CHEMOTHERAPY ALERT CARD or IMMUNOTHERAPY  ALERT CARD at check-in to the Emergency Department and triage nurse.  Should you have questions after your visit or need to cancel or reschedule your appointment, please contact [facility] - A DEPT OF [facility]  [phone] and follow the prompts.  Office hours are 8:00 a.m. to 4:30 p.m. Monday - Friday. Please note that voicemails left after 4:00 p.m. may not be returned until the following business day.  We are closed weekends and major holidays. You have access to a nurse at all times for urgent questions. Please call the main number to the clinic [phone] and follow the prompts.  For any non-urgent questions, you may also contact your provider using MyChart. We now offer e-Visits for anyone 77 and older to request care online for non-urgent symptoms. For details visit [email or web address].   Also download the MyChart app! Go to the app store, search MyChart, open the app, select [facility], and log in with your MyChart username and password.  Azacitidine  Injection What is this medication? AZACITIDINE  (ay [employer] i deen) treats blood and bone marrow cancers. It works by slowing down the growth of cancer cells. This medicine may be used for other purposes; ask your health care provider or pharmacist if you have questions. COMMON BRAND NAME(S): Vidaza  What should I tell my care team before I take this medication? They need to know if you have any of these conditions: Kidney disease Liver disease Low blood cell levels, such as low white cells, platelets, or red blood cells Low levels of albumin in  the blood Low levels of bicarbonate in the blood An unusual or allergic reaction to azacitidine , mannitol, other medications, foods, dyes, or preservatives If you or your partner are pregnant or trying to get pregnant Breast-feeding How should I use this medication? This medication is injected into a vein or under the skin. It is given by your care  team in a hospital or clinic setting. Talk to your care team about the use of this medication in children. While it may be prescribed for children as young as 1 month for selected conditions, precautions do apply. Overdosage: If you think you have taken too much of this medicine contact a poison control center or emergency room at once. NOTE: This medicine is only for you. Do not share this medicine with others. What if I miss a dose? Keep appointments for follow-up doses. It is important not to miss your dose. Call your care team if you are unable to keep an appointment. What may interact with this medication? Interactions are not expected. This list may not describe all possible interactions. Give your health care provider a list of all the medicines, herbs, non-prescription drugs, or dietary supplements you use. Also tell them if you smoke, drink alcohol, or use illegal drugs. Some items may interact with your medicine. What should I watch for while using this medication? Your condition will be monitored carefully while you are receiving this medication. This medication may make you feel generally unwell. This is not uncommon as chemotherapy can affect healthy cells as well as cancer cells. Report any side effects. Continue your course of treatment even though you feel ill unless your care team tells you to stop. You may need blood work done while you are taking this medication. Other product types may be available that contain the medication azacitidine . The injection and oral products should not be used in place of one another. Talk to your care team if you have questions. This medication can cause serious side effects. To reduce the risk, your care team may give you other medications to take before receiving this one. Be sure to follow the directions from your care team. This medication may increase your risk of getting an infection. Call your care team for advice if you get a fever, chills, sore  throat, or other symptoms of a cold or flu. Do not treat yourself. Try to avoid being around people who are sick. Avoid taking medications that contain aspirin, acetaminophen , ibuprofen , naproxen, or ketoprofen unless instructed by your care team. These medications may hide a fever. Be careful brushing or flossing your teeth or using a toothpick because you may get an infection or bleed more easily. If you have any dental work done, tell your dentist you are receiving this medication. Talk to your care team if you or your partner may be pregnant. Serious birth defects can occur if you take this medication during pregnancy and for 6 months after the last dose. You will need a negative pregnancy test before starting this medication. Contraception is recommended while taking this medication and for 6 months after the last dose. Your care team can help you find the option that works for you. If your partner can get pregnant, use a condom during sex while taking this medication and for 3 months after the last dose. Do not breastfeed while taking this medication and for 1 week after the last dose. This medication may cause infertility. Talk to your care team if you are concerned about your fertility.  What side effects may I notice from receiving this medication? Side effects that you should report to your care team as soon as possible: Allergic reactions--skin rash, itching, hives, swelling of the face, lips, tongue, or throat Infection--fever, chills, cough, sore throat, wounds that don't heal, pain or trouble when passing urine, general feeling of discomfort or being unwell Kidney injury--decrease in the amount of urine, swelling of the ankles, hands, or feet Liver injury--right upper belly pain, loss of appetite, nausea, light-colored stool, dark yellow or brown urine, yellowing skin or eyes, unusual weakness or fatigue Low red blood cell level--unusual weakness or fatigue, dizziness, headache, trouble  breathing Tumor lysis syndrome (TLS)--nausea, vomiting, diarrhea, decrease in the amount of urine, dark urine, unusual weakness or fatigue, confusion, muscle pain or cramps, fast or irregular heartbeat, joint pain Unusual bruising or bleeding Side effects that usually do not require medical attention (report to your care team if they continue or are bothersome): Constipation Diarrhea Nausea Pain, redness, or irritation at injection site Vomiting This list may not describe all possible side effects. Call your doctor for medical advice about side effects. You may report side effects to FDA at 1-800-FDA-1088. Where should I keep my medication? This medication is given in a hospital or clinic. It will not be stored at home. NOTE: This sheet is a summary. It may not cover all possible information. If you have questions about this medicine, talk to your doctor, pharmacist, or health care provider.  2024 Elsevier/Gold Standard (2023-05-07 00:00:00)

## 2024-09-02 ENCOUNTER — Inpatient Hospital Stay: Admitting: Oncology

## 2024-09-02 ENCOUNTER — Inpatient Hospital Stay

## 2024-09-02 ENCOUNTER — Other Ambulatory Visit: Payer: Self-pay | Admitting: Oncology

## 2024-09-02 ENCOUNTER — Telehealth: Payer: Self-pay | Admitting: Oncology

## 2024-09-02 NOTE — Telephone Encounter (Signed)
 Patients friend called to let us  know that she is doing an trial at [facility] and she wanted to cancel all appointments here. She didn't think the patient would be coming back. She said that [facility] was going to send over the notes to Dr. Jacobo. I did let her know that they could call back if they needed to.   Appointments cancelled as requested. Please cancel IS request.   Thank you

## 2024-09-03 ENCOUNTER — Inpatient Hospital Stay

## 2024-09-04 ENCOUNTER — Inpatient Hospital Stay

## 2024-09-16 ENCOUNTER — Inpatient Hospital Stay

## 2024-09-16 ENCOUNTER — Inpatient Hospital Stay: Admitting: Oncology

## 2024-09-22 ENCOUNTER — Inpatient Hospital Stay: Admitting: Oncology

## 2024-09-22 ENCOUNTER — Inpatient Hospital Stay

## 2024-09-23 ENCOUNTER — Other Ambulatory Visit: Payer: Self-pay | Admitting: General Surgery

## 2024-09-23 ENCOUNTER — Inpatient Hospital Stay

## 2024-09-23 DIAGNOSIS — Z1231 Encounter for screening mammogram for malignant neoplasm of breast: Secondary | ICD-10-CM

## 2024-09-30 NOTE — Discharge Summary (Signed)
 ------------------------------------------------------------------------------- Attestation signed by Kingsley Arlean Devonshire, MD at 09/30/24 2216 I saw and evaluated the patient, participating in the key portions of the service on the day of discharge.  I reviewed the residents note and agree with the discharge plans and disposition. I personally spent 43 minutes in discharge planning services. Arlean Devonshire Kingsley, MD  -------------------------------------------------------------------------------   Physician Discharge Summary [facility] [address] [place] [address] Dept: [phone] Loc: [phone]   Identifying Information:  Rebecca Mitchell 1941/07/18 [id number]  Primary Care Physician: Edman Marsa Lynwood Manson, DO   Referring Physician: Referred Self   Code Status: DNR and DNI  Admit Date: 09/28/2024  Discharge Date: 09/30/2024   Discharge To: Home  Discharge Service: [facility] - Hematology Res Floor Team (MED E - Tower)   Discharge Attending Physician: No att. providers found  Discharge Diagnoses: Principal Problem:   Left leg pain Active Problems:   Acute myeloid leukemia not having achieved remission    (CMS-HCC)   Chemotherapy follow-up examination   Outpatient Provider Follow Up Issues:  Follow-up Plan after discharge: Issues related to hospitalization: Left femur pain Follow-up appointment for lab draws: 11/14 Follow-up appointment with [facility] Oncology: 11/14 Oncology specific plans going forward: Continue with Study AUTX (s/p D1C1)   Warm handoff via Epic message or direct conversation?: Yes.  Hospital Course:  Outpatient To [facility]  [  ] Follow up with oncology clinic on 01/14  Rebecca Mitchell is a 84 y/o female with AML, t(3:3) s/p cycle 7 of Aza/Ven in November who presented to the ED on 09/28/2024 with twelve hours of acute onset, severe, left femur pain. Initial imaging in the ED with CT Pelvis and CT Lower Extremity of the left leg  showed no acute abnormality. Due to significant and acute onset of sx, pursued MRI of pelvis, left femur, and knee that showed leukemic bone marrow and muscle infiltration with surrounding edema. Given that these sx are secondary to progressive Leukemia, decided to proceed with C1D1 of UTX trial. Pt received her first dose inpatient and was monitored for ~3 hours after adminsitration. Repeat labs and EKG collected. Pt was then discharged and transitioned to infusion clinic for completion of monitoring and repeat ecg and labs. Rebecca Mitchell has established follow up with the oncology team in clinic on 01/14. Please see summary of assessment and plan by problem.   Left lower extremity pain  History of peripheral neuropathy Presenting with acute onset left lower extremity pain situated primarily in the left anterior thigh with radiation of pain to left lower leg. There was marked point tenderness along the anterior thigh on exam without sign of hematoma. CT of the pelvis and left lower extremity did not reveal fracture or bleed. Differential was broad including: cancer related pain, nerve compression, myositis, occult fracture, and shingles. MRI with and without contrast of hip, femur, and knee showed large area of enhancement up to 4.3cm with evidence of leukemic bone marrow infiltration. Pain treated with oxycodone 5mg -10mg  daily. PT/OT saw the patient. Discharged pt with 5 days of 5mg -10mg  oxycodone as well as Miralax  until she is able to follow up outpatient. Given that pain is related to cancer, worry that her sx will not be easily alleviated.    AML, t(3:3) Follows with Dr.Lacy Trudy [facility]) and locally with [facility] (Dr. Evalene Reusing).  Diagnosed in 2025 and now has completed  cycle 7 of Aza/Ven in November 2025.  Given rising blast present in December 2025, decision  to pursue AUTX clinical trial with C1D1 taking place on day of discharge (01/13). Recent bone marrow biopsy 09/22/24  with blast count 22%. Pt was monitored for ~3 hours while inpatient and then completed repeat labs and EKGs prior leaving [facility] on day of discharge. Plans to follow up in clinic on 01/14.    Chronic Problems:   Hypertension: Continue home amlodipine , metoprolol . Hydrochlorothiazide paused due to normal blood pressure while admitted on amlodipine  and metoprolol .  Hyperlipidemia: Continue home pravastatin  Immunocompromised status: Patient is immunocompromised secondary to disease or chemotherapy -Antimicrobial prophylaxis as above Impending Electrolyte Abnormality Secondary to Chemotherapy and/or IV Fluids -Daily Electrolyte monitoring -Replete per [facility] guidelines.  Anemia/thrombocytopenia secondary to disease: Transfused according to thresholds for anemia and thrombocytopenia  Hematology/Oncology History Overview Note  Diagnosis:  t-AML  Presentation: sob and cytopenias  Presenting WBC Count: 5.9  Bone Marrow Biopsy: 11/19/2023 Bone marrow, aspiration and biopsy -  Limited sample of hypercellular and markedly fibrotic bone marrow (70%) involved by acute myeloid leukemia with MECOM rearrangement (See Comment) -  By outside report, cytogenetic analysis revealed an abnormal female karyotype with translocation(3;3); see details in scanned documents.   Peripheral blood, smear review: -  8% circulating blasts with a myeloid immunophenotype by outside flow cytometrc analysis  Cytogenetics: Abnormal Karyotype: 46,XX,t(3;3)(q21;q26.2)[20]   Molecular Mutations: DNMT3A, GATA2, SF3B1  BMbx: 12/13/2023 Diagnosis  Bone marrow, right iliac, aspirate and core biopsy -  Limited evaluation of hypercellular bone marrow (70-80%) with marked fibrosis, compatible with patient's recent diagnosis of acute myeloid leukemia with MECOM rearrangement (see Comment)   BAML S12: Azacitidine  75mg /m2 x 7 days + venetoclax 400mg  x 14 days Cycle 1: 12/24/2022  BMbx: 4/28 Diagnosis  Bone marrow, right iliac,  aspirate and core biopsy -  Hypercellular bone marrow (70%) with extensive fibrosis and near-absent hematopoiesis (<5% CD34-positive blasts by immunohistochemical analysis)  - flow MRD 3.1% - FISH for MECOM 8.5%    BMbx: 02/04/2023 Diagnosis  Bone marrow, right iliac, aspirate and core biopsy - Hypercellular bone marrow (80%) with extensive fibrosis and near-absent hematopoiesis (<5% CD34-positive blasts by immunohistochemical analysis)   - flow MRD unable to be done - FISH for MECOM 22%   Cycle 2: 02/13/2024 Aza 75mg /m2 x 7 days + venetoclax 100mg  x 14 days (+vori) 02/13/24 - WBC 4 (ANC 0.5), Hg 10.1, Plts 59  BMbx: 03/04/2024 Diagnosis  Bone marrow, right iliac, aspirate and core biopsy -  Limited sample of cellular bone marrow with fibrosis and no overt increase in blasts (<5% by CD34 immunohistochemistry) (See Comment)  - flow MRD 4.1% - FISH for MECOMr at 19%   Cycle 3: 03/24/2024 Aza 75 mg/m2 x 5 days Ven 100 mg x 14 days  Cycle 4 05/22/2024 Aza 75 mg/m2 x 5 days Ven 100 mg x 14 days  Bmbx: 05/14/24 -  Hypercellular bone marrow (90%) with marked fibrosis and erythroid-predominant hematopoiesis with 4% blasts by manual touch prep differential count (<5% CD34-positive blasts by immunohistochemical analysis) -  Reticulin & Trichrome reveals diffuse and dense increase in reticulin with extensive intersections, often associated with focal bundles of collagen and/or focal osteosclerosis (MF-3)  MRD by flow 4.6%   C5 07/02/24 - 5 days aza, 21 days venetoclax  C7 07/28/24 - 7 days aza, 21 days venetoclax   08/18/24- Hypercellular bone marrow (90%) with marked fibrosis and trilineage hematopoiesis (~5% CD34-positive blasts by immunohistochemical analysis)  -  Reticulin & Trichrome reveals diffuse and dense increase in reticulin with extensive intersections, often associated with  focal bundles of collagen and/or focal osteosclerosis (MF-3) - Hematologics 7% MRD, no metaphases  available for cytos   Acute myeloid leukemia not having achieved remission    (CMS-HCC)  12/13/2023 Initial Diagnosis   Acute myeloid leukemia not having achieved remission      Procedures:  No admission procedures for hospital encounter. ______________________________________________________________________ Discharge Medications:   Your Medication List     PAUSE taking these medications    hydroCHLOROthiazide 25 MG tablet Wait to take this until your doctor or other care provider tells you to start again. Commonly known as: HYDRODIURIL TAKE 1 TABLET(25 MG) BY MOUTH DAILY       STOP taking these medications    ondansetron  4 MG tablet Commonly known as: ZOFRAN    voriconazole 200 MG tablet Commonly known as: VFEND       START taking these medications    lidocaine  4 % patch Commonly known as: ASPERCREME Place 1 patch on the skin daily. Start taking on: October 01, 2024   oxyCODONE 5 MG immediate release tablet Commonly known as: ROXICODONE Take 1-2 tablets (5-10 mg total) by mouth every four (4) hours as needed for pain.   STUDY AUTX-703-001 AUTX-703 0.5 mg Tab tablet Take 4 tablets (2 mg total) by mouth once a week for 28 days  with 1 other STUDY AUTX-703-001 AUTX-703 prescription for 5 mg total. Take once a week, on Days 1, 8, 15, and 22 of each cycle. No food for at least 2 hours before and 1 hour after administration.   STUDY AUTX-703-001 AUTX-703 3 mg Tab tablet Take 1 tablet (3 mg total) by mouth once a week for 28 days  with 1 other STUDY AUTX-703-001 AUTX-703 prescription for 5 mg total. Take once a week, on Days 1, 8, 15, and 22 of each cycle. No food for at least 2 hours before and 1 hour after administration.       CHANGE how you take these medications    polyethylene glycol 17 gram packet Commonly known as: MIRALAX  Take 17 g by mouth daily. What changed:  when to take this reasons to take this       CONTINUE taking these medications     acetaminophen  650 MG CR tablet Commonly known as: TYLENOL  8 HOUR Take 1 tablet (650 mg total) by mouth every eight (8) hours as needed for pain.   albuterol 90 mcg/actuation inhaler Commonly known as: PROVENTIL HFA;VENTOLIN HFA 2 puffs every six (6) hours as needed for shortness of breath.   amlodipine  5 MG tablet Commonly known as: NORVASC  Take 1 tablet (5 mg total) by mouth in the morning.   ascorbic acid (vitamin C) 1000 MG tablet Commonly known as: VITAMIN C Take 1 tablet (1,000 mg total) by mouth in the morning.   cholecalciferol (vitamin D3-10 mcg (400 unit)) 10 mcg (400 unit) Cap Take 125 mcg by mouth in the morning.   co-enzyme Q-10 50 mg capsule Take 1 capsule (50 mg total) by mouth daily.   fluticasone  propionate 50 mcg/actuation nasal spray Commonly known as: FLONASE  1 spray into each nostril daily as needed.   lutein-zeaxanthin 20 mg- 1,000 mcg Cap Take 1 tablet by mouth in the morning.   metoPROLOL  tartrate 25 MG tablet Commonly known as: Lopressor  Take 1 tablet (25 mg total) by mouth two (2) times a day.   pravastatin  20 MG tablet Commonly known as: PRAVACHOL  Take 1 tablet (20 mg total) by mouth daily.   SENEXON-S 8.6-50 mg Generic drug: senna-docusate  Take 2 tablets by mouth daily as needed for constipation.   valACYclovir 500 MG tablet Commonly known as: VALTREX Take 1 tablet (500 mg total) by mouth daily.        Allergies: Patient has no known allergies. ______________________________________________________________________ Pending Test Results (if blank, then none): Pending Labs     Order Current Status   [facility] DRAW Collected (09/30/24 1243)   PHLEBOTOMY-RESEARCH DRAW Collected (09/30/24 1439)   Cytokine Panel In process   VRE Screen In process       Most Recent Labs: CBC - Results in Past 30 Days Result Component Current Result Ref Range Previous Result Ref Range  HCT 27.1 (L) (09/30/2024) 34.0 - 44.0 % 27.1 (L)  (09/30/2024) 34.0 - 44.0 %  HGB 9.4 (L) (09/30/2024) 11.3 - 14.9 g/dL 9.4 (L) (8/86/7973) 88.6 - 14.9 g/dL  MCH 70.2 (8/86/7973) 74.0 - 32.4 pg 29.9 (09/30/2024) 25.9 - 32.4 pg  MCHC 34.7 (09/30/2024) 32.0 - 36.0 g/dL 65.2 (8/86/7973) 67.9 - 36.0 g/dL  MCV 14.3 (8/86/7973) 22.3 - 95.7 fL 86.1 (09/30/2024) 77.6 - 95.7 fL  MPV 9.2 (09/30/2024) 6.8 - 10.7 fL 8.8 (09/30/2024) 6.8 - 10.7 fL  Platelet 28 (L) (09/30/2024) 150 - 450 10*9/L 26 (L) (09/30/2024) 150 - 450 10*9/L  RBC 3.17 (L) (09/30/2024) 3.95 - 5.13 10*12/L 3.14 (L) (09/30/2024) 3.95 - 5.13 10*12/L  WBC 5.4 (09/30/2024) 3.6 - 11.2 10*9/L 4.5 (09/30/2024) 3.6 - 11.2 10*9/L   BMP - Results in Past 30 Days Result Component Current Result Ref Range Previous Result Ref Range  BUN 14 (09/30/2024) 9 - 23 mg/dL 14 (8/86/7973) 9 - 23 mg/dL  Chloride 898 (8/86/7973) 98 - 107 mmol/L 99 (09/30/2024) 98 - 107 mmol/L  CO2 25.0 (09/30/2024) 20.0 - 31.0 mmol/L 25.0 (09/30/2024) 20.0 - 31.0 mmol/L  Creatinine 0.67 (09/30/2024) 0.55 - 1.02 mg/dL 9.38 (8/86/7973) 9.44 - 1.02 mg/dL  Glucose 871 (8/86/7973) 70 - 179 mg/dL 893 (8/86/7973) 70 - 820 mg/dL  Potassium 4.0 (8/86/7973) 3.4 - 4.8 mmol/L 4.1 (09/30/2024) 3.4 - 4.8 mmol/L  Sodium 136 (09/30/2024) 135 - 145 mmol/L 136 (09/30/2024) 135 - 145 mmol/L    Relevant Studies/Radiology (if blank, then none): MRI Lower Extremity Non-Joint Left W Wo Contrast Result Date: 09/30/2024 EXAM: MRI LOWER EXTREMITY NON-JOINT LEFT W WO CONTRAST DATE: 09/30/2024 2:21 AM ACCESSION: [id number] UN DICTATED: 09/30/2024 8:01 AM INTERPRETATION LOCATION: MAIN CAMPUS CLINICAL INDICATION: 84 years old Female with Pt with leukemia with acute onset mid femur pain  COMPARISON: None. TECHNIQUE: MRI of the left femur was performed before and after administration of IV contrast using the body multicoil array covering the lower extremity from the hips to the knees.  Multisequence, multiplanar large field of view images were obtained. FINDINGS: Scattered small  areas of T2 hyperintensity with minimal enhancement throughout the femur. There is a larger 4.3 x 1.7 x 1.3 cm T2 hyperintense lesion within the mid femoral diaphysis (11:9, 12:51). There is no cortical breakthrough. There is surrounding edema within the surrounding vastus intermedius musculature and short head of the biceps femoris. There is also mild amount of subcutaneous edema and edema along the fascial planes throughout the thigh. There is also a 0.6 cm T1 and T2 hyperintense lesion along the lateral aspect of the subcutaneous tissue (24:49). The musculature of the thigh is otherwise normal and symmetric. Limited visualization of the hip and knee joints is normal. The neurovascular bundles are grossly normal.   1.  Scattered areas of T2 hyperintensity with minimal enhancement throughout the femur with  a larger area measuring up to 4.3 cm, which is concerning for leukemic bone marrow infiltration. 2.  There is also surrounding edema surrounding the distal femoral shaft, within the vastus intermedius musculature, and short head of the biceps femoris, which is concerning for muscular infiltration. There is also periosteal reaction at the site concerning for weakening bone. No definite cortical fracture is appreciated. There is however endosteal scalloping of the femur at this level caused by the intramedullary deposits. 3.  Lower mild edema along the fascial planes and within the subcutaneous tissue. There is a 0.6 cm T2 hyperintense lesion along the lateral subcutaneous tissue which is also concerning for implant.  XR Chest Portable Result Date: 09/30/2024 EXAM: XR CHEST PORTABLE ACCESSION: [id number] UN REPORT DATE: 09/30/2024 8:22 AM CLINICAL INDICATION: CHEST PAIN  TECHNIQUE: Single View AP Chest Radiograph. COMPARISON: CT CHEST INTERPRETATION OF OUTSIDE FILM 11/15/2023 FINDINGS: Evaluation is limited due to severe scoliosis. Lungs are clear.  No pleural effusion or pneumothorax. Cardiac silhouette is  enlarged. Marked dextroscoliosis of the thoracic spine.   No acute cardiopulmonary process. Thoracic dextroscoliosis.   ______________________________________________________________________ Discharge Instructions:   Activity Instructions     Activity as tolerated           Other Instructions     Call MD for:  persistent nausea or vomiting     Call MD for:  severe uncontrolled pain     Call MD for: Temperature > 38.5 Celsius ( > 101.3 Fahrenheit)     Discharge instructions     You were admitted to [facility] with left leg pain.  During your hospitalization we did CT Scans and MRI of your leg. We also started you on Cycle 1 of study chemotherapy.   We have made the following adjustments to your medications. Please refer to your After Visit Summary for a complete list.   Of note:  1. START oxycodone 5mg  or 10mg  (1-2 tablets) by mouth, as needed for pain. Take 1 tablet for moderate pain. Take 2 tablets for severe pain. 2. START Miralax , 1 capful per day. This can be taken up to 4 times a day.  3. START Lidocaine  patches as needed for leg pain.   Activity: You may return to activity as tolerated.  Return Precautions: please return to the Emergency Department if you experience the following symptoms.  - Fever, Chills, Dizziness, Falls, Syncope, Uncontrolled Bleeding, and Weakness or worsening of symptoms.   Follow up Appointments Encounter Information  Provider Department Center 09/30/2024 5:00 PM (Arrive by 4:30 PM) ONCINF CHAIR 16 [facility] [place] 10/01/2024 10:30 AM (Arrive by 10:00 AM) ADULT ONC LAB [facility] ADULT ONCOLOGY LAB DRAW [facility] [place] [facility] 10/01/2024 11:00 AM (Arrive by 10:30 AM) ONCINF CHAIR 13 [facility] [place] [facility] 10/03/2024 12:45 PM (Arrive by 12:15 PM) ADULT ONC LAB [facility] ADULT ONCOLOGY LAB DRAW [facility] [place] [facility] 10/03/2024 1:45 PM (Arrive by 1:15 PM) ONCDEV CHAIR 55 [facility]  ONCOLOGY INF[facility]ION [place]  It was a pleasure taking care of you!  Chemotherapy during admission? yes  - Type of chemotherapy: Study AUTX C1D1  --------------  When to Call Your [facility] Cancer Care Team:  Monday- Friday from 8:00 am - 5:00 pm: Call [phone] or Toll free [phone].  Ask to speak to the Triage Nurse On Nights, Weekends and Holidays: Call [phone]. Ask the operator to page the Oncology Fellow on Call   RED ZONE:  Take action now!  You need to be seen right away. Call 911 or go to your nearest hospital for help.  - Symptoms are at a severe level of discomfort   - Bleeding that will not stop  - Chest Pain   - Hard to breathe    - Fall or passing out   - New Seizure    - Thoughts of hurting yourself or others   YELLOW ZONE: Take action today This is NOT an all-inclusive list. Pleae call with any new or worsening symptoms.  Call your doctor, nurse or other healthcare provider at [phone] You can be seen by a provider the same day through our Same Day Acute Care for Patients with Cancer program.  - Symptoms are new or worsening; You are not within your goal range for:   - Pain          - Swelling (leg, arm, abdomen, face, neck)   - Shortness of breath        - Skin rash or skin changes   - Bleeding (nose, urine, stool, wound)    - Wound issues (redness, drainage, re-opened)   - Feeling sick to your stomach and throwing up    - Confusion   - Mouth sores/pain in your mouth or throat     - Vision changes  - Hard stool or very loose stools (increase in ostomy   - Fever >100.4 F, chills    Output)        - Worsening cough with mucus that is green, yellow or bloody  - No urine for 12 hours      - Pain or burning when going to the bathroom   - Feeding tube or other catheter/tube issue     - Home infusion pump issue - call [phone]  - Redness or pain at previous IV or port/catheter site   - Depressed or anxiety   GREEN ZONE: You are in control   Your symptoms are under control. Continue to take your medicine as ordered. Keep all visits to the doctor.  - No increase or worsening symptoms  - Able to take your medicine  - Able to drink and eat   For your safety and best care, please DO NOT use MyChart messages to report red or yellow symptoms.  MyChart messages are only checked during weekday normal business hours and you should receive a  Response within 2 business days.  Please use MyChart only for the follows:  - Non-urgent medication refills, scheduling requests or general questions.     Patient Education:   - Wash your hands routinely with soap and water - Take your temperature when you have chills or are not feeling well - Use a soft toothbrush - Avoid constipation or straining with bowel movements. This may mean you occasionally need to take over-the-counter stool softeners or laxatives.  - Avoid people who have colds or the flu, or are not feeling well. - Wear a mask when visiting crowded places. - Maintain a well-balanced diet and eat healthy foods - Speak with your doctor before having any dental work done - Do only as much activity as you can tolerate  Other instructions: - Don't use dental floss if your platelet count is below 50,000. Your doctor or nurse should tell you if this is the case. - Use any mouthwashes given to you as directed. - If you can't tolerate regular brushing, use an oral swab (bristle-less) toothbrush, or use salt and baking soda to clean your  mouth. Mix 1 teaspoon of salt and 1 teaspoon of baking soda into an 8-ounce glass of warm water. Swish and spit. - Watch your mouth and tongue for white patches. This is a sign of fungal infection, a common side effect of chemotherapy. Be sure to tell your doctor about these patches. Medication/mouthwashes can be prescribed to help you fight the fungal infection.    COVID-19 is a new challenge, but [facility] and the [facility] is dedicated to  providing you and your loved ones with the best possible cancer care and support in the safest way possible during this time. We made two videos about the ways we are working to keep you safe, such as offering the option to visit your care team over the phone or through a video, as well as support services offered for our patients and their caregivers. If you have any questions about your cancer care, please call your care team.   Video #1: Keeping [facility] patients safe during the COVID-19 crisis [email or web address]   Video #2: Support for cancer patients and their caregivers during the COVID-19 pandemic [email or web address]://[email or web address]   Video #2: Support for cancer patients and their caregivers during the COVID-19 pandemic [email or web address]://go.[email or web address]   STUDY 12-Lead ECG     ** Study supplied ECG ** Refer to Study Instructions order for time points.   STUDY OP Oral Chemotherapy Order     Patient is to take a dose of study drug(s) while in clinic.   STUDY Order/Instruction(s)     -- Study Time Point #1: Pre-dose (within 30 min) -- 1) Vital signs (includes blood pressure, pulse, respiratory rate, and temperature) 2) ECG (*study-supplied*)     - Patient recumbent or semi-recumbent for approximately 3 min.         - In triplicate, approximately 2 min apart. 3) Blood sample(s): PK, PD, Exploratory Blood     - Tube(s) provided by study coordinator.   STUDY Order/Instruction(s)     -- Study Time Point #2: Post-Dose [0.5 Hours (+/- 10 min)] -- 1) ECG (*study-supplied*)     - Patient recumbent or semi-recumbent for approximately 3 min.         - In triplicate, approximately 2 min apart. 2) Blood sample(s): PK     - Tube(s) provided by study coordinator.   STUDY Order/Instruction(s)     -- Study Time Point #3: Post-Dose [1 Hour (+/- 15 min)] -- 1) ECG (*study-supplied*)     - Patient recumbent or semi-recumbent for  approximately 3 min.         - In triplicate, approximately 2 min apart. 2) Blood sample(s): PK     - Tube(s) provided by study coordinator.   STUDY Order/Instruction(s)     -- Study Time Point #4: Post-Dose [2 Hours (+/- 30 min)] -- 1) ECG (*study-supplied*)     - Patient recumbent or semi-recumbent for approximately 3 min.         - In triplicate, approximately 2 min apart. 2) Blood sample(s): PK     - Tube(s) provided by study coordinator.   STUDY Order/Instruction(s)     -- Study Time Point #5: Post-Dose [4 Hours (+/- 30 min)] -- 1) ECG (*study-supplied*)     - Patient recumbent or semi-recumbent for approximately 3 min.         - In triplicate, approximately 2 min apart. 2) Blood sample(s): PK, PD, Exploratory Blood     - Tube(s) provided by study coordinator.   STUDY Order/Instruction(s)     --  Study Time Point #6: Post-Dose [8 Hours (+/- 2 hr)] -- 1) ECG (*study-supplied*)     - Patient recumbent or semi-recumbent for approximately 3 min.         - In triplicate, approximately 2 min apart. 2) Blood sample(s): PK     - Tube(s) provided by study coordinator.   STUDY Order/Instruction(s)     -- Study Time Point #1: Post-Dose [24 Hours (+/- 2 hr)] -- 1) ECG (*study-supplied*)     - Patient recumbent or semi-recumbent for approximately 3 min.         - In triplicate, approximately 2 min apart. 2) Blood sample(s): PK, PD, Exploratory Blood     - Tube(s) provided by study coordinator.   STUDY Order/Instruction(s)     -- Study Time Point #1: QW Dosing - Post-Dose [72 Hours (+/- 2 hr)] OR BIW Dosing - Pre-dose (within 30 min) -- 1) ECG (*study-supplied*)     - Patient recumbent or semi-recumbent for approximately 3 min.         - In triplicate, approximately 2 min apart. 2) Blood sample(s): PK, PD, Exploratory Blood                   To be drawn on Day 4: 10/04/2023     - Tube(s) provided by study coordinator.   STUDY Patient: Cleared for Treatment     Patient must be cleared for  treatment by research coordinator and/or treating investigator prior to chemotherapy preparation by Pharmacy (i.e., prior to releasing the order Okay to Send Medication/Chemotherapy to Unit&quot;).   Patient must be cleared for treatment* by research coordinator and/or treating investigator (medical oncologist) prior to chemotherapy preparation by Pharmacy (i.e., prior to releasing the order Okay to Send Medication/Chemotherapy to Unit).  *Study patient cleared for treatment must have all safety labs resulted and reviewed.   STUDY Research Coordinator     Research Coordinator Name: Lear Ana   Pager Number: [phone]   Contact the assigned study coordinator for issues / questions.       Follow Up instructions and Outpatient Referrals    Call MD for:  persistent nausea or vomiting     Call MD for:  severe uncontrolled pain     Call MD for: Temperature > 38.5 Celsius ( > 101.3 Fahrenheit)     Discharge instructions       Appointments which have been scheduled for you    Oct 01, 2024 10:30 AM (Arrive by 10:00 AM) LAB ONLY [facility] with ADULT ONC LAB [facility] ADULT ONCOLOGY LAB [place] [facility] [place]) [address] [place] [address] [phone]     Oct 01, 2024 11:00 AM (Arrive by 10:30 AM) LAB CHECK with [facility]BERT[facility]'S CHAIR 13 [facility] ONCOLOGY [place] [facility] [place]) [address] [place] [address] [phone]     Oct 03, 2024 12:45 PM (Arrive by 12:15 PM) LAB ONLY [facility] with ADULT ONC LAB [facility] ADULT ONCOLOGY LAB [place] [facility] REGION) [address] [place] [address] [phone]     Oct 03, 2024 1:45 PM (Arrive by 1:15 PM) CTU INFUSION ONLY with ONCDEV CHAIR 55 [facility] [place]OGY [place] [facility] REGION) [address] [place] [address] [phone]     Oct 08, 2024 11:15 AM (Arrive by 10:45 AM) LAB ONLY  [facility] with ADULT ONC LAB [facility] ADULT ONCOLOGY LAB [place] [address] [place] [address] [phone]     Oct 08, 2024 12:15 PM (Arrive by 11:45 AM) CTU INFUSION ONLY with ONCDEV CHAIR 49 [facility] ON[place]OGY [place] [facility] REGION) [address] [place] [address] [phone]     Oct 13, 2024 7:30 AM (Arrive by 7:00 AM) LAB ONLY [facility] with ADULT ONC LAB [facility] ADULT ONCOLOGY LAB [place] [facility] REGION) [address] [place] [address] [phone]     Oct 13, 2024 8:30 AM (Arrive by 8:00 AM) RETURN ACTIVE [facility] with Armida Glendia Pouch, MD [facility] HEMATOLOGY ONCOLOGY 2ND [facility] REGION) [address] [place] [address] [phone]     Oct 13, 2024 9:30 AM (Arrive by 9:00 AM) CTU INFUSION W/ PRIOR APPTS with ONCDEV CHAIR 49 [facility] [place]COLOGY [place] [facility] REGION) [address] [place] [address] [phone]     Oct 14, 2024 11:30 AM (Arrive by 11:00 AM) LAB ONLY [facility] with ADULT ONC LAB [facility] ADULT ONCOLOGY LAB [place] [facility] REGION) [address] [place] [address] [phone]     Oct 14, 2024 12:30 PM (Arrive by 12:00 PM) CTU INFUSION ONLY with ONCDEV CHAIR 53 [facility] ONCOLOGY [place] [facility] REGION) [address] [place] [address] [phone]     Oct 20, 2024 10:00 AM (Arrive by 9:30 AM) LAB ONLY [facility] with ADULT ONC LAB [facility] ADULT ONCOLOGY LAB [place] [facility] REGION) [address] [place] [address] [phone]     Oct 20, 2024 11:00 AM (Arrive by 10:30 AM) CTU INFUSION ONLY with ONCDEV CHAIR 54 [facility] ONCOLOGY [place] [facility] REGION) [address] [place]  [address] [phone]     Oct 23, 2024 9:00 AM (Arrive by 8:30 AM) LAB DRAW with ADULT ONC LAB [facility] ADULT ONCOLOGY LAB [place] [facility] REGION) [address] [place] [address] [phone]     Oct 23, 2024 10:00 AM (Arrive by 9:30 AM) BONE MARROW BIOPSY with Asberry Nemiah Adjutant, AGNP [facility] ONCOLOGY [place] [facility] REGION) [address] [place] [address] [phone]        ______________________________________________________________________ Discharge Day Services: BP 142/67   Pulse 97   Temp 37 C (98.6 F) (Oral)   Resp 18   Ht 154.9 cm (5' 0.98)   Wt 51.2 kg (112 lb 14 oz)   LMP 03/18/1986   SpO2 96%   BMI 21.34 kg/m  Pt seen on the day of discharge and determined appropriate for discharge.  Condition at Discharge: fair  Length of Discharge: I spent greater than 30 mins in the discharge of this patient.  Jessica Saganowich, MD Internal Medicine, PGY1

## 2024-10-01 ENCOUNTER — Telehealth: Payer: Self-pay

## 2024-10-01 NOTE — Transitions of Care (Post Inpatient/ED Visit) (Signed)
" ° °  10/01/2024  Name: Rebecca Mitchell MRN: [id number] DOB: 1941/07/22  Today's TOC FU Call Status: Today's TOC FU Call Status:: Unsuccessful Call (1st Attempt) Unsuccessful Call (1st Attempt) Date: 10/01/24  Attempted to reach the patient regarding the most recent Inpatient/ED visit.  Follow Up Plan: Additional outreach attempts will be made to reach the patient to complete the Transitions of Care (Post Inpatient/ED visit) call.   Shona Prow RN, CCM [place]  VBCI-Population Health RN Care Manager [phone]  "

## 2024-10-02 ENCOUNTER — Telehealth: Payer: Self-pay

## 2024-10-02 NOTE — Transitions of Care (Post Inpatient/ED Visit) (Signed)
" ° °  10/02/2024  Name: Rebecca Mitchell MRN: [id number] DOB: 1940/09/30  Today's TOC FU Call Status: Today's TOC FU Call Status:: Successful TOC FU Call Completed TOC FU Call Complete Date: 10/02/24 (TOC RN explained TOC program and patient states, right now I feel fine, my husband helps me and right now I don't feel I need the [facility] program Patient declined)  Patient's Name and Date of Birth confirmed. DOB, Name    Shona Prow RN, CCM [place]  VBCI-Population Health RN Care Manager [phone]  "

## 2024-10-06 NOTE — Progress Notes (Signed)
 Rebecca Mitchell                                          MRN: [id number]   10/06/2024   The VBCI Quality Team Specialist reviewed this patient medical record for the purposes of chart review for care gap closure. The following were reviewed: chart review for care gap closure-glycemic status assessment.    VBCI Quality Team

## 2024-11-11 ENCOUNTER — Encounter

## 2024-11-16 DEATH — deceased

## 2024-12-09 ENCOUNTER — Ambulatory Visit: Admitting: Family Medicine
